# Patient Record
Sex: Female | Born: 1950 | Race: White | Hispanic: No | Smoking: Former smoker
Health system: Southern US, Community
[De-identification: ages and names within clinical notes are randomized; demographics above are authoritative.]

## PROBLEM LIST (undated history)

## (undated) DIAGNOSIS — E0500 Thyrotoxicosis with diffuse goiter without thyrotoxic crisis or storm: Secondary | ICD-10-CM

## (undated) DIAGNOSIS — R011 Cardiac murmur, unspecified: Secondary | ICD-10-CM

## (undated) DIAGNOSIS — T7806XA Anaphylactic reaction due to food additives, initial encounter: Secondary | ICD-10-CM

## (undated) DIAGNOSIS — F419 Anxiety disorder, unspecified: Secondary | ICD-10-CM

## (undated) DIAGNOSIS — R000 Tachycardia, unspecified: Secondary | ICD-10-CM

## (undated) DIAGNOSIS — F319 Bipolar disorder, unspecified: Secondary | ICD-10-CM

## (undated) DIAGNOSIS — S92909A Unspecified fracture of unspecified foot, initial encounter for closed fracture: Secondary | ICD-10-CM

## (undated) DIAGNOSIS — K2100 Gastro-esophageal reflux disease with esophagitis, without bleeding: Secondary | ICD-10-CM

## (undated) DIAGNOSIS — L258 Unspecified contact dermatitis due to other agents: Secondary | ICD-10-CM

## (undated) DIAGNOSIS — E785 Hyperlipidemia, unspecified: Secondary | ICD-10-CM

## (undated) DIAGNOSIS — E0789 Other specified disorders of thyroid: Secondary | ICD-10-CM

## (undated) DIAGNOSIS — S8010XA Contusion of unspecified lower leg, initial encounter: Secondary | ICD-10-CM

## (undated) DIAGNOSIS — I10 Essential (primary) hypertension: Secondary | ICD-10-CM

## (undated) HISTORY — DX: Hyperlipidemia, unspecified: E78.5

## (undated) HISTORY — DX: Essential (primary) hypertension: I10

## (undated) HISTORY — DX: Unspecified contact dermatitis due to other agents: L25.8

## (undated) HISTORY — DX: Other specified disorders of thyroid: E07.89

## (undated) HISTORY — DX: Cardiac murmur, unspecified: R01.1

## (undated) HISTORY — DX: Thyrotoxicosis with diffuse goiter without thyrotoxic crisis or storm: E05.00

## (undated) HISTORY — PX: STERILIZATION: SHX533

## (undated) HISTORY — DX: Gastro-esophageal reflux disease with esophagitis, without bleeding: K21.00

## (undated) HISTORY — PX: CATARACT EXTRACTION: SUR2

## (undated) HISTORY — DX: Gastro-esophageal reflux disease with esophagitis: K21.0

## (undated) HISTORY — DX: Anaphylactic reaction due to food additives, initial encounter: T78.06XA

## (undated) HISTORY — DX: Contusion of unspecified lower leg, initial encounter: S80.10XA

## (undated) HISTORY — DX: Anxiety disorder, unspecified: F41.9

## (undated) HISTORY — PX: SALPINGECTOMY: SHX328

## (undated) HISTORY — DX: Bipolar disorder, unspecified: F31.9

## (undated) HISTORY — DX: Unspecified fracture of unspecified foot, initial encounter for closed fracture: S92.909A

## (undated) HISTORY — PX: OTHER SURGICAL HISTORY: SHX169

## (undated) HISTORY — DX: Tachycardia, unspecified: R00.0

---

## 2004-09-29 ENCOUNTER — Ambulatory Visit: Payer: Self-pay | Admitting: Unknown Physician Specialty

## 2005-01-29 ENCOUNTER — Ambulatory Visit: Payer: Self-pay | Admitting: Internal Medicine

## 2005-03-09 ENCOUNTER — Emergency Department: Payer: Self-pay | Admitting: General Practice

## 2005-03-09 ENCOUNTER — Emergency Department: Payer: Self-pay | Admitting: Emergency Medicine

## 2005-03-14 ENCOUNTER — Emergency Department: Payer: Self-pay | Admitting: Emergency Medicine

## 2005-05-01 ENCOUNTER — Emergency Department: Payer: Self-pay | Admitting: Emergency Medicine

## 2005-05-01 ENCOUNTER — Other Ambulatory Visit: Payer: Self-pay

## 2005-05-24 ENCOUNTER — Emergency Department: Payer: Self-pay | Admitting: Emergency Medicine

## 2005-06-06 ENCOUNTER — Ambulatory Visit: Payer: Self-pay | Admitting: Unknown Physician Specialty

## 2005-11-20 ENCOUNTER — Ambulatory Visit: Payer: Self-pay | Admitting: Internal Medicine

## 2006-11-19 ENCOUNTER — Ambulatory Visit: Payer: Self-pay | Admitting: Gastroenterology

## 2009-11-20 ENCOUNTER — Emergency Department: Payer: Self-pay | Admitting: Emergency Medicine

## 2009-12-29 LAB — HM PAP SMEAR: HM Pap smear: NORMAL

## 2011-09-06 ENCOUNTER — Ambulatory Visit: Payer: Self-pay | Admitting: Family

## 2011-12-25 DIAGNOSIS — F319 Bipolar disorder, unspecified: Secondary | ICD-10-CM | POA: Diagnosis not present

## 2011-12-25 DIAGNOSIS — E0789 Other specified disorders of thyroid: Secondary | ICD-10-CM | POA: Diagnosis not present

## 2011-12-25 DIAGNOSIS — T7806XA Anaphylactic reaction due to food additives, initial encounter: Secondary | ICD-10-CM | POA: Diagnosis not present

## 2011-12-25 DIAGNOSIS — E785 Hyperlipidemia, unspecified: Secondary | ICD-10-CM | POA: Diagnosis not present

## 2012-01-02 DIAGNOSIS — Z124 Encounter for screening for malignant neoplasm of cervix: Secondary | ICD-10-CM | POA: Diagnosis not present

## 2012-01-02 DIAGNOSIS — Z9189 Other specified personal risk factors, not elsewhere classified: Secondary | ICD-10-CM | POA: Diagnosis not present

## 2012-01-02 DIAGNOSIS — N952 Postmenopausal atrophic vaginitis: Secondary | ICD-10-CM | POA: Diagnosis not present

## 2012-01-02 DIAGNOSIS — Z01419 Encounter for gynecological examination (general) (routine) without abnormal findings: Secondary | ICD-10-CM | POA: Diagnosis not present

## 2012-01-22 DIAGNOSIS — Z79899 Other long term (current) drug therapy: Secondary | ICD-10-CM | POA: Diagnosis not present

## 2012-01-30 DIAGNOSIS — F3110 Bipolar disorder, current episode manic without psychotic features, unspecified: Secondary | ICD-10-CM | POA: Diagnosis not present

## 2012-01-31 DIAGNOSIS — J309 Allergic rhinitis, unspecified: Secondary | ICD-10-CM | POA: Diagnosis not present

## 2012-01-31 DIAGNOSIS — J305 Allergic rhinitis due to food: Secondary | ICD-10-CM | POA: Diagnosis not present

## 2012-02-14 DIAGNOSIS — T783XXA Angioneurotic edema, initial encounter: Secondary | ICD-10-CM | POA: Diagnosis not present

## 2012-03-25 DIAGNOSIS — I10 Essential (primary) hypertension: Secondary | ICD-10-CM | POA: Diagnosis not present

## 2012-03-25 DIAGNOSIS — T7806XA Anaphylactic reaction due to food additives, initial encounter: Secondary | ICD-10-CM | POA: Diagnosis not present

## 2012-03-25 DIAGNOSIS — E559 Vitamin D deficiency, unspecified: Secondary | ICD-10-CM | POA: Diagnosis not present

## 2012-03-25 DIAGNOSIS — E785 Hyperlipidemia, unspecified: Secondary | ICD-10-CM | POA: Diagnosis not present

## 2012-05-06 DIAGNOSIS — F3110 Bipolar disorder, current episode manic without psychotic features, unspecified: Secondary | ICD-10-CM | POA: Diagnosis not present

## 2012-07-28 DIAGNOSIS — Z23 Encounter for immunization: Secondary | ICD-10-CM | POA: Diagnosis not present

## 2012-08-15 DIAGNOSIS — F3110 Bipolar disorder, current episode manic without psychotic features, unspecified: Secondary | ICD-10-CM | POA: Diagnosis not present

## 2012-08-22 DIAGNOSIS — E785 Hyperlipidemia, unspecified: Secondary | ICD-10-CM | POA: Diagnosis not present

## 2012-08-22 DIAGNOSIS — E0590 Thyrotoxicosis, unspecified without thyrotoxic crisis or storm: Secondary | ICD-10-CM | POA: Diagnosis not present

## 2012-08-22 DIAGNOSIS — IMO0002 Reserved for concepts with insufficient information to code with codable children: Secondary | ICD-10-CM | POA: Diagnosis not present

## 2012-09-04 DIAGNOSIS — N6489 Other specified disorders of breast: Secondary | ICD-10-CM | POA: Diagnosis not present

## 2012-09-04 DIAGNOSIS — Z1231 Encounter for screening mammogram for malignant neoplasm of breast: Secondary | ICD-10-CM | POA: Diagnosis not present

## 2012-09-04 DIAGNOSIS — J438 Other emphysema: Secondary | ICD-10-CM | POA: Diagnosis not present

## 2012-09-04 DIAGNOSIS — Z79899 Other long term (current) drug therapy: Secondary | ICD-10-CM | POA: Diagnosis not present

## 2012-09-05 DIAGNOSIS — E039 Hypothyroidism, unspecified: Secondary | ICD-10-CM | POA: Diagnosis not present

## 2012-09-05 DIAGNOSIS — E785 Hyperlipidemia, unspecified: Secondary | ICD-10-CM | POA: Diagnosis not present

## 2012-09-05 DIAGNOSIS — R928 Other abnormal and inconclusive findings on diagnostic imaging of breast: Secondary | ICD-10-CM | POA: Diagnosis not present

## 2012-09-19 DIAGNOSIS — E039 Hypothyroidism, unspecified: Secondary | ICD-10-CM | POA: Diagnosis not present

## 2012-09-19 DIAGNOSIS — E785 Hyperlipidemia, unspecified: Secondary | ICD-10-CM | POA: Diagnosis not present

## 2012-09-19 DIAGNOSIS — F319 Bipolar disorder, unspecified: Secondary | ICD-10-CM | POA: Diagnosis not present

## 2012-09-19 DIAGNOSIS — H35379 Puckering of macula, unspecified eye: Secondary | ICD-10-CM | POA: Diagnosis not present

## 2012-11-19 ENCOUNTER — Ambulatory Visit: Payer: Self-pay | Admitting: Family Medicine

## 2012-11-19 DIAGNOSIS — S0180XA Unspecified open wound of other part of head, initial encounter: Secondary | ICD-10-CM | POA: Diagnosis not present

## 2012-11-21 DIAGNOSIS — F3110 Bipolar disorder, current episode manic without psychotic features, unspecified: Secondary | ICD-10-CM | POA: Diagnosis not present

## 2012-12-29 LAB — HM MAMMOGRAPHY: HM MAMMO: NORMAL

## 2013-01-16 DIAGNOSIS — N181 Chronic kidney disease, stage 1: Secondary | ICD-10-CM | POA: Diagnosis not present

## 2013-01-16 DIAGNOSIS — E785 Hyperlipidemia, unspecified: Secondary | ICD-10-CM | POA: Diagnosis not present

## 2013-01-16 DIAGNOSIS — I129 Hypertensive chronic kidney disease with stage 1 through stage 4 chronic kidney disease, or unspecified chronic kidney disease: Secondary | ICD-10-CM | POA: Diagnosis not present

## 2013-02-18 DIAGNOSIS — F3110 Bipolar disorder, current episode manic without psychotic features, unspecified: Secondary | ICD-10-CM | POA: Diagnosis not present

## 2013-04-10 DIAGNOSIS — E785 Hyperlipidemia, unspecified: Secondary | ICD-10-CM | POA: Diagnosis not present

## 2013-04-10 DIAGNOSIS — Z79899 Other long term (current) drug therapy: Secondary | ICD-10-CM | POA: Diagnosis not present

## 2013-04-17 DIAGNOSIS — E785 Hyperlipidemia, unspecified: Secondary | ICD-10-CM | POA: Diagnosis not present

## 2013-05-13 DIAGNOSIS — F3110 Bipolar disorder, current episode manic without psychotic features, unspecified: Secondary | ICD-10-CM | POA: Diagnosis not present

## 2013-07-13 DIAGNOSIS — Z23 Encounter for immunization: Secondary | ICD-10-CM | POA: Diagnosis not present

## 2013-07-31 DIAGNOSIS — I129 Hypertensive chronic kidney disease with stage 1 through stage 4 chronic kidney disease, or unspecified chronic kidney disease: Secondary | ICD-10-CM | POA: Diagnosis not present

## 2013-07-31 DIAGNOSIS — Z5189 Encounter for other specified aftercare: Secondary | ICD-10-CM | POA: Diagnosis not present

## 2013-07-31 DIAGNOSIS — E785 Hyperlipidemia, unspecified: Secondary | ICD-10-CM | POA: Diagnosis not present

## 2013-07-31 DIAGNOSIS — N181 Chronic kidney disease, stage 1: Secondary | ICD-10-CM | POA: Diagnosis not present

## 2013-08-19 DIAGNOSIS — F3110 Bipolar disorder, current episode manic without psychotic features, unspecified: Secondary | ICD-10-CM | POA: Diagnosis not present

## 2013-08-21 DIAGNOSIS — F319 Bipolar disorder, unspecified: Secondary | ICD-10-CM | POA: Diagnosis not present

## 2013-08-21 DIAGNOSIS — I129 Hypertensive chronic kidney disease with stage 1 through stage 4 chronic kidney disease, or unspecified chronic kidney disease: Secondary | ICD-10-CM | POA: Diagnosis not present

## 2013-08-21 DIAGNOSIS — Z87891 Personal history of nicotine dependence: Secondary | ICD-10-CM | POA: Diagnosis not present

## 2013-08-21 DIAGNOSIS — N181 Chronic kidney disease, stage 1: Secondary | ICD-10-CM | POA: Diagnosis not present

## 2013-09-21 DIAGNOSIS — T783XXA Angioneurotic edema, initial encounter: Secondary | ICD-10-CM | POA: Diagnosis not present

## 2013-09-21 DIAGNOSIS — L272 Dermatitis due to ingested food: Secondary | ICD-10-CM | POA: Diagnosis not present

## 2013-10-09 DIAGNOSIS — H35379 Puckering of macula, unspecified eye: Secondary | ICD-10-CM | POA: Diagnosis not present

## 2013-10-09 DIAGNOSIS — H2510 Age-related nuclear cataract, unspecified eye: Secondary | ICD-10-CM | POA: Diagnosis not present

## 2013-10-09 DIAGNOSIS — H25049 Posterior subcapsular polar age-related cataract, unspecified eye: Secondary | ICD-10-CM | POA: Diagnosis not present

## 2013-10-09 DIAGNOSIS — H02839 Dermatochalasis of unspecified eye, unspecified eyelid: Secondary | ICD-10-CM | POA: Diagnosis not present

## 2013-10-09 DIAGNOSIS — H25019 Cortical age-related cataract, unspecified eye: Secondary | ICD-10-CM | POA: Diagnosis not present

## 2013-10-21 DIAGNOSIS — I129 Hypertensive chronic kidney disease with stage 1 through stage 4 chronic kidney disease, or unspecified chronic kidney disease: Secondary | ICD-10-CM | POA: Diagnosis not present

## 2013-10-21 DIAGNOSIS — N181 Chronic kidney disease, stage 1: Secondary | ICD-10-CM | POA: Diagnosis not present

## 2013-10-21 DIAGNOSIS — E785 Hyperlipidemia, unspecified: Secondary | ICD-10-CM | POA: Diagnosis not present

## 2013-10-21 DIAGNOSIS — F319 Bipolar disorder, unspecified: Secondary | ICD-10-CM | POA: Diagnosis not present

## 2013-10-23 DIAGNOSIS — L509 Urticaria, unspecified: Secondary | ICD-10-CM | POA: Diagnosis not present

## 2013-10-23 DIAGNOSIS — L272 Dermatitis due to ingested food: Secondary | ICD-10-CM | POA: Diagnosis not present

## 2013-10-23 DIAGNOSIS — T783XXA Angioneurotic edema, initial encounter: Secondary | ICD-10-CM | POA: Diagnosis not present

## 2013-11-04 ENCOUNTER — Ambulatory Visit: Payer: Self-pay | Admitting: Family

## 2013-11-04 DIAGNOSIS — Z87891 Personal history of nicotine dependence: Secondary | ICD-10-CM | POA: Diagnosis not present

## 2013-11-04 DIAGNOSIS — I129 Hypertensive chronic kidney disease with stage 1 through stage 4 chronic kidney disease, or unspecified chronic kidney disease: Secondary | ICD-10-CM | POA: Diagnosis not present

## 2013-11-04 DIAGNOSIS — M79609 Pain in unspecified limb: Secondary | ICD-10-CM | POA: Diagnosis not present

## 2013-11-04 DIAGNOSIS — N181 Chronic kidney disease, stage 1: Secondary | ICD-10-CM | POA: Diagnosis not present

## 2013-11-04 DIAGNOSIS — M7989 Other specified soft tissue disorders: Secondary | ICD-10-CM | POA: Diagnosis not present

## 2013-11-04 DIAGNOSIS — R609 Edema, unspecified: Secondary | ICD-10-CM | POA: Diagnosis not present

## 2013-11-04 DIAGNOSIS — S8990XA Unspecified injury of unspecified lower leg, initial encounter: Secondary | ICD-10-CM | POA: Diagnosis not present

## 2013-11-20 DIAGNOSIS — F3110 Bipolar disorder, current episode manic without psychotic features, unspecified: Secondary | ICD-10-CM | POA: Diagnosis not present

## 2013-12-21 DIAGNOSIS — H2510 Age-related nuclear cataract, unspecified eye: Secondary | ICD-10-CM | POA: Diagnosis not present

## 2013-12-21 DIAGNOSIS — H269 Unspecified cataract: Secondary | ICD-10-CM | POA: Diagnosis not present

## 2014-01-17 ENCOUNTER — Emergency Department: Payer: Self-pay | Admitting: Internal Medicine

## 2014-01-17 DIAGNOSIS — R000 Tachycardia, unspecified: Secondary | ICD-10-CM | POA: Diagnosis not present

## 2014-01-17 DIAGNOSIS — Z888 Allergy status to other drugs, medicaments and biological substances status: Secondary | ICD-10-CM | POA: Diagnosis not present

## 2014-01-17 DIAGNOSIS — R079 Chest pain, unspecified: Secondary | ICD-10-CM | POA: Diagnosis not present

## 2014-01-17 DIAGNOSIS — I10 Essential (primary) hypertension: Secondary | ICD-10-CM | POA: Diagnosis not present

## 2014-01-17 DIAGNOSIS — Z9851 Tubal ligation status: Secondary | ICD-10-CM | POA: Diagnosis not present

## 2014-01-17 DIAGNOSIS — R002 Palpitations: Secondary | ICD-10-CM | POA: Diagnosis not present

## 2014-01-17 DIAGNOSIS — Z79899 Other long term (current) drug therapy: Secondary | ICD-10-CM | POA: Diagnosis not present

## 2014-01-17 DIAGNOSIS — R072 Precordial pain: Secondary | ICD-10-CM | POA: Diagnosis not present

## 2014-01-17 LAB — URINALYSIS, COMPLETE
BACTERIA: NONE SEEN
Glucose,UR: NEGATIVE mg/dL (ref 0–75)
KETONE: NEGATIVE
Nitrite: NEGATIVE
PROTEIN: NEGATIVE
Ph: 6 (ref 4.5–8.0)
RBC,UR: 1 /HPF (ref 0–5)
Specific Gravity: 1.011 (ref 1.003–1.030)

## 2014-01-17 LAB — COMPREHENSIVE METABOLIC PANEL
ALT: 44 U/L (ref 12–78)
ANION GAP: 5 — AB (ref 7–16)
Albumin: 4.3 g/dL (ref 3.4–5.0)
Alkaline Phosphatase: 85 U/L
BUN: 22 mg/dL — ABNORMAL HIGH (ref 7–18)
Bilirubin,Total: 0.3 mg/dL (ref 0.2–1.0)
CALCIUM: 9.4 mg/dL (ref 8.5–10.1)
CHLORIDE: 104 mmol/L (ref 98–107)
Co2: 29 mmol/L (ref 21–32)
Creatinine: 0.74 mg/dL (ref 0.60–1.30)
EGFR (African American): >60
GLUCOSE: 84 mg/dL (ref 65–99)
OSMOLALITY: 278 (ref 275–301)
Potassium: 4 mmol/L (ref 3.5–5.1)
SGOT(AST): 34 U/L (ref 15–37)
Sodium: 138 mmol/L (ref 136–145)
Total Protein: 7.8 g/dL (ref 6.4–8.2)

## 2014-01-17 LAB — CBC
HCT: 39.9 % (ref 35.0–47.0)
HGB: 13.1 g/dL (ref 12.0–16.0)
MCH: 28.3 pg (ref 26.0–34.0)
MCHC: 32.8 g/dL (ref 32.0–36.0)
MCV: 86 fL (ref 80–100)
Platelet: 141 10*3/uL — ABNORMAL LOW (ref 150–440)
RBC: 4.64 10*6/uL (ref 3.80–5.20)
RDW: 14.1 % (ref 11.5–14.5)
WBC: 4.8 10*3/uL (ref 3.6–11.0)

## 2014-01-17 LAB — TROPONIN I: Troponin-I: <0.02 ng/mL

## 2014-01-17 LAB — TSH: THYROID STIMULATING HORM: 2.02 u[IU]/mL

## 2014-01-22 ENCOUNTER — Ambulatory Visit (INDEPENDENT_AMBULATORY_CARE_PROVIDER_SITE_OTHER): Payer: Medicare Other | Admitting: Cardiovascular Disease

## 2014-01-22 ENCOUNTER — Encounter: Payer: Self-pay | Admitting: Cardiovascular Disease

## 2014-01-22 ENCOUNTER — Encounter (INDEPENDENT_AMBULATORY_CARE_PROVIDER_SITE_OTHER): Payer: Self-pay

## 2014-01-22 VITALS — BP 140/88 | HR 68 | Ht 67.5 in | Wt 138.5 lb

## 2014-01-22 DIAGNOSIS — E785 Hyperlipidemia, unspecified: Secondary | ICD-10-CM

## 2014-01-22 DIAGNOSIS — R0789 Other chest pain: Secondary | ICD-10-CM | POA: Diagnosis not present

## 2014-01-22 DIAGNOSIS — R000 Tachycardia, unspecified: Secondary | ICD-10-CM | POA: Insufficient documentation

## 2014-01-22 DIAGNOSIS — Z87891 Personal history of nicotine dependence: Secondary | ICD-10-CM | POA: Insufficient documentation

## 2014-01-22 MED ORDER — PROPRANOLOL HCL 20 MG PO TABS: 20.0000 mg | ORAL_TABLET | Freq: Three times a day (TID) | ORAL | Status: DC | PRN

## 2014-01-22 NOTE — Assessment & Plan Note (Signed)
Etiology of her tachycardia includes possible atrial tachycardia, sinus tachycardia, SVT or other arrhythmia. As her arrhythmia is rare, only one episode, and she's been asymptomatic otherwise, she will monitor her heart rhythm closely using her pulse meter on her phone. We have offered Holter monitor or 30 day monitor. She does not feel that this would capture her rhythm at this time. She will call us if she has additional episodes. We have given her propranolol 20 mg to take as needed

## 2014-01-22 NOTE — Progress Notes (Signed)
Patient ID: Amy Mcconnell, female    DOB: 04/18/1951, 63 y.o.   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 She presents for new patient evaluation.  She reports that last weekend, approximately 5 days ago, she woke at 6 AM and shortly after developed acute onset tachycardia. She feels it was a regular rhythm, is uncertain of the speed reports that it was significantly faster than her baseline rhythm. She lay still on her bed for 2 hours and eventually her symptoms resolved. She did not want to go to the emergency room for that symptoms did not resolve, she eventually went to the emergency room. In the ER, her symptoms have resolved en route. EKG was essentially normal, basic metabolic panel normal, cardiac enzymes normal, no acute findings and she was sent home.   She denies having symptoms prior to this or since then. She does report that she drinks some alcohol, has dark chocolate every night before bed. She tries to watch her diet closely and exercises. She is very active, works at hospice and able to exert herself without any symptoms. She denies any lightheadedness, near syncope or syncope.  EKG shows normal sinus rhythm with rate 68 beats per minute with nonspecific ST abnormality  Prior lab work in 2012 shows total cholesterol 200, normal renal function Prior notes from Dr. Delorise Jackson  indicates symptoms of leg swelling in December 2014. She was sent for leg ultrasound. Results unavailable   Outpatient Encounter Prescriptions as of 01/22/2014  Medication Sig  . aspirin 81 MG tablet Take 81 mg by mouth daily.  Marland Kitchen atorvastatin (LIPITOR) 40 MG tablet Take 40 mg by mouth daily.  . B Complex Vitamins (VITAMIN B COMPLEX PO) Take by mouth daily.  . diphenhydrAMINE (SOMINEX) 25 MG tablet  Take 25 mg by mouth at bedtime as needed for sleep.  Marland Kitchen levothyroxine (SYNTHROID, LEVOTHROID) 125 MCG tablet Take 125 mcg by mouth daily before breakfast.  . lithium carbonate (ESKALITH) 450 MG CR tablet Take by mouth 2 (two) times daily.  . Omega-3 Fatty Acids (FISH OIL) 1000 MG CAPS Take by mouth daily.  . propranolol (INDERAL) 20 MG tablet Take 1 tablet (20 mg total) by mouth 3 (three) times daily as needed (for tachycardia).  . vitamin B-12 (CYANOCOBALAMIN) 1000 MCG tablet Take 1,000 mcg by mouth daily.  . vitamin E 1000 UNIT capsule Take 1,000 Units by mouth daily.  Marland Kitchen zinc gluconate 50 MG tablet Take 50 mg by mouth daily.  . ziprasidone (GEODON) 60 MG capsule Take 60 mg by mouth daily.    Review of Systems  Constitutional: Negative.   HENT: Negative.   Eyes: Negative.   Respiratory: Negative.   Cardiovascular: Positive for palpitations.       Tachycardia  Gastrointestinal: Negative.   Endocrine: Negative.   Musculoskeletal: Negative.   Skin: Negative.   Allergic/Immunologic: Negative.   Neurological: Negative.   Hematological: Negative.   Psychiatric/Behavioral: Negative.   All other systems reviewed and are negative.    BP 140/88  Pulse 68  Ht 5' 7.5" (1.715 m)  Wt 138 lb 8 oz (62.823 kg)  BMI 21.36 kg/m2  Physical Exam  Nursing note and vitals reviewed. Constitutional: She is oriented to person, place, and time. She appears well-developed and well-nourished.  HENT:  Head: Normocephalic.  Nose: Nose normal.  Mouth/Throat: Oropharynx is clear and moist.  Eyes: Conjunctivae are normal. Pupils are equal, round, and reactive to light.  Neck: Normal range of motion. Neck supple. No JVD present.  Cardiovascular: Normal rate, regular rhythm, S1 normal, S2 normal, normal heart sounds and intact distal pulses.  Exam reveals no gallop and no friction rub.   No murmur heard. Pulmonary/Chest: Effort normal and breath sounds normal. No respiratory distress. She has no wheezes.  She has no rales. She exhibits no tenderness.  Abdominal: Soft. Bowel sounds are normal. She exhibits no distension. There is no tenderness.  Musculoskeletal: Normal range of motion. She exhibits no edema and no tenderness.  Lymphadenopathy:    She has no cervical adenopathy.  Neurological: She is alert and oriented to person, place, and time. Coordination normal.  Skin: Skin is warm and dry. No rash noted. No erythema.  Psychiatric: She has a normal mood and affect. Her behavior is normal. Judgment and thought content normal.    Assessment and Plan        and

## 2014-01-22 NOTE — Patient Instructions (Signed)
You are doing well. Take propranolol as needed for tachycardia.  Consider Red Yeast Rice for cholesterol  App store on your phone search for "pulse meter" Download pulse monitoring programs When you have palpitations, measure your heart rate   Please call us if you have new issues that need to be addressed before your next appt.

## 2014-01-22 NOTE — Assessment & Plan Note (Signed)
She stopped many years ago. No significant shortness of breath with exertion concerning for COPD

## 2014-01-22 NOTE — Assessment & Plan Note (Signed)
She is taking fish oil for cholesterol. We have recommended she changes to red yeast rice as her triglycerides are less than 70

## 2014-02-10 DIAGNOSIS — Z79899 Other long term (current) drug therapy: Secondary | ICD-10-CM | POA: Diagnosis not present

## 2014-02-12 ENCOUNTER — Encounter: Payer: Medicare Other | Admitting: Adult Health

## 2014-02-15 DIAGNOSIS — F3110 Bipolar disorder, current episode manic without psychotic features, unspecified: Secondary | ICD-10-CM | POA: Diagnosis not present

## 2014-02-15 NOTE — Progress Notes (Signed)
This encounter was created in error - please disregard.

## 2014-02-18 ENCOUNTER — Telehealth: Payer: Self-pay | Admitting: Adult Health

## 2014-02-18 NOTE — Telephone Encounter (Signed)
Pt states she received a call asking her to call the office.  No messages seen to relay to pt.  Pt reminded of upcoming appt.

## 2014-02-26 ENCOUNTER — Encounter: Payer: Self-pay | Admitting: Adult Health

## 2014-02-26 ENCOUNTER — Ambulatory Visit (INDEPENDENT_AMBULATORY_CARE_PROVIDER_SITE_OTHER): Payer: Medicare Other | Admitting: Adult Health

## 2014-02-26 ENCOUNTER — Encounter (INDEPENDENT_AMBULATORY_CARE_PROVIDER_SITE_OTHER): Payer: Self-pay

## 2014-02-26 VITALS — BP 138/84 | HR 76 | Temp 98.1°F | Resp 14 | Ht 67.5 in | Wt 137.0 lb

## 2014-02-26 DIAGNOSIS — Z91018 Allergy to other foods: Secondary | ICD-10-CM

## 2014-02-26 DIAGNOSIS — T781XXA Other adverse food reactions, not elsewhere classified, initial encounter: Secondary | ICD-10-CM | POA: Diagnosis not present

## 2014-02-26 NOTE — Progress Notes (Signed)
Pre visit review using our clinic review tool, if applicable. No additional management support is needed unless otherwise documented below in the visit note. 

## 2014-02-26 NOTE — Progress Notes (Signed)
Patient ID: Amy Mcconnell, female   DOB: [birth year], 63 y.o.   MRN: [id number]    Subjective:    Patient ID: Amy Mcconnell, female    DOB: 06/10/1951, 63 y.o.   MRN: [id number]  HPI Pt is a pleasant 63 y/o female who presents to clinic to establish care. She is due for her complete physical and she will schedule that relatively soon. She has brought her medical records with her. Pt with multiple food allergies which she controls well. She had extensive testing which did not show "true" allergy to anything but thought they may be sensitivities. She reports having lip swelling with certain fruits and vegetables. Uncertain if it may be r/t chemicals that are added as part of the fertilizing process. No concerns today.   Past Medical History  Diagnosis Date  . Other specified disorders of thyroid   . Essential hypertension, benign   . Contusion of unspecified part of lower limb   . Other and unspecified hyperlipidemia   . Contact dermatitis and other eczema due to other specified agent   . Reflux esophagitis   . Bipolar disorder     Dr. Thurmond Butts - every 3 months  . 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 Heart murmur      Past Surgical History  Procedure Laterality Date  . Cataract extraction    . Salpingectomy Left   . Sterilization      Her decision since dz of bipolar     Family History  Problem Relation Age of Onset  . Arrhythmia Mother     A-Fib  . Heart failure Mother   . Hyperlipidemia Mother   . Hypertension Mother   . Hypertension Father   . Hyperlipidemia Father   . Mental retardation Father 57    Suicide  . Hypertension Brother   . Hyperlipidemia Brother   . Hypertension Brother   . Hyperlipidemia Brother   . Hyperlipidemia Brother   . Hypertension Brother   . Alcohol abuse Brother   . Depression Brother   . Hyperlipidemia Brother   . Hypertension Brother      History   Social History  . Marital Status: Divorced    Spouse Name: N/A    Number  of Children: 0  . Years of Education: 36   Occupational History  . Not on file.   Social History Main Topics  . Smoking status: Former Smoker -- 0.50 packs/day for 13 years    Quit date: 01/23/2000  . Smokeless tobacco: Not on file  . Alcohol Use: No  . Drug Use: No  . Sexual Activity: Not on file   Other Topics Concern  . Not on file   Social History Narrative   Ms. Guthridge grew up in [place], [place]. She lives in [place]. Ms. Kitzmiller is divorced. She has 2 cats (Charlie and Angie). 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 She also volunteers 2 days at [facility]. She enjoys reading, garding and cooking.     Current Outpatient Prescriptions on File Prior to Visit  Medication Sig Dispense Refill  . aspirin 81 MG tablet Take 81 mg by mouth daily.      Marland Kitchen atorvastatin (LIPITOR) 40 MG tablet Take 40 mg by mouth daily.      . B Complex Vitamins (VITAMIN B COMPLEX PO) Take by mouth daily.      . diphenhydrAMINE (SOMINEX) 25 MG tablet Take 25 mg by mouth at bedtime as needed for sleep.      Marland Kitchen  levothyroxine (SYNTHROID, LEVOTHROID) 125 MCG tablet Take 125 mcg by mouth daily before breakfast.      . lithium carbonate (ESKALITH) 450 MG CR tablet Take by mouth every evening.       . Omega-3 Fatty Acids (FISH OIL) 1000 MG CAPS Take by mouth daily.      . propranolol (INDERAL) 20 MG tablet Take 1 tablet (20 mg total) by mouth 3 (three) times daily as needed (for tachycardia).  90 tablet  3  . vitamin B-12 (CYANOCOBALAMIN) 1000 MCG tablet Take 1,000 mcg by mouth daily.      . vitamin E 1000 UNIT capsule Take 1,000 Units by mouth daily.      Marland Kitchen zinc gluconate 50 MG tablet Take 50 mg by mouth daily.      . ziprasidone (GEODON) 60 MG capsule Take 60 mg by mouth daily.       No current facility-administered medications on file prior to visit.     Review of Systems  Constitutional: Negative.   HENT: Negative.   Eyes: Negative.   Respiratory: Negative.   Cardiovascular: Negative.     Gastrointestinal: Negative.   Endocrine: Negative.   Genitourinary: Negative.   Musculoskeletal: Negative.   Skin: Negative.   Allergic/Immunologic: Positive for environmental allergies and food allergies. Negative for immunocompromised state.  Neurological: Negative.   Hematological: Negative.   Psychiatric/Behavioral: Negative.        Objective:  BP 138/84  Pulse 76  Temp(Src) 98.1 F (36.7 C) (Oral)  Resp 14  Ht 5' 7.5" (1.715 m)  Wt 137 lb (62.143 kg)  BMI 21.13 kg/m2  SpO2 95%   Physical Exam  Constitutional: She is oriented to person, place, and time. No distress.  HENT:  Head: Normocephalic and atraumatic.  Eyes: Conjunctivae and EOM are normal.  Neck: Normal range of motion. Neck supple.  Cardiovascular: Normal rate, regular rhythm, normal heart sounds and intact distal pulses.  Exam reveals no gallop and no friction rub.   No murmur heard. Pulmonary/Chest: Effort normal and breath sounds normal. No respiratory distress. She has no wheezes. She has no rales.  Musculoskeletal: Normal range of motion.  Neurological: She is alert and oriented to person, place, and time. She has normal reflexes. Coordination normal.  Skin: Skin is warm and dry.  Psychiatric: She has a normal mood and affect. Her behavior is normal. Judgment and thought content normal.      Assessment & Plan:   1. Multiple food allergies Sensitivities to certain foods. She takes benadryl to help control her symptoms which typically involve swelling of the lips and eyes. She has not experienced any shortness of breath. She also take Allegra daily which also helps.

## 2014-02-26 NOTE — Patient Instructions (Signed)
   Thank you for choosing [place] [facility] for your health care needs.  Schedule your complete physical. You will need to be fasting for that appointment.  Remember to activate your MyChart Account.

## 2014-03-26 ENCOUNTER — Encounter: Payer: Self-pay | Admitting: Adult Health

## 2014-03-26 ENCOUNTER — Ambulatory Visit (INDEPENDENT_AMBULATORY_CARE_PROVIDER_SITE_OTHER): Payer: Medicare Other | Admitting: Adult Health

## 2014-03-26 VITALS — BP 126/74 | HR 53 | Temp 97.7°F | Resp 16 | Ht 67.5 in | Wt 134.8 lb

## 2014-03-26 DIAGNOSIS — R5383 Other fatigue: Secondary | ICD-10-CM | POA: Diagnosis not present

## 2014-03-26 DIAGNOSIS — Z1239 Encounter for other screening for malignant neoplasm of breast: Secondary | ICD-10-CM

## 2014-03-26 DIAGNOSIS — Z13820 Encounter for screening for osteoporosis: Secondary | ICD-10-CM | POA: Diagnosis not present

## 2014-03-26 DIAGNOSIS — Z0000 Encounter for general adult medical examination without abnormal findings: Secondary | ICD-10-CM

## 2014-03-26 DIAGNOSIS — R5381 Other malaise: Secondary | ICD-10-CM

## 2014-03-26 DIAGNOSIS — E785 Hyperlipidemia, unspecified: Secondary | ICD-10-CM | POA: Diagnosis not present

## 2014-03-26 LAB — CBC WITH DIFFERENTIAL/PLATELET
Basophils Absolute: 0 10*3/uL (ref 0.0–0.1)
Basophils Relative: 0.5 % (ref 0.0–3.0)
EOS PCT: 1.9 % (ref 0.0–5.0)
Eosinophils Absolute: 0.1 10*3/uL (ref 0.0–0.7)
HCT: 39.5 % (ref 36.0–46.0)
Hemoglobin: 13.2 g/dL (ref 12.0–15.0)
LYMPHS ABS: 1.5 10*3/uL (ref 0.7–4.0)
Lymphocytes Relative: 34 % (ref 12.0–46.0)
MCHC: 33.4 g/dL (ref 30.0–36.0)
MCV: 85.2 fl (ref 78.0–100.0)
MONOS PCT: 9.4 % (ref 3.0–12.0)
Monocytes Absolute: 0.4 10*3/uL (ref 0.1–1.0)
Neutro Abs: 2.3 10*3/uL (ref 1.4–7.7)
Neutrophils Relative %: 54.2 % (ref 43.0–77.0)
Platelets: 168 10*3/uL (ref 150.0–400.0)
RBC: 4.64 Mil/uL (ref 3.87–5.11)
RDW: 14.6 % (ref 11.5–15.5)
WBC: 4.3 10*3/uL (ref 4.0–10.5)

## 2014-03-26 LAB — LIPID PANEL
CHOL/HDL RATIO: 2
CHOLESTEROL: 215 mg/dL — AB (ref 0–200)
HDL: 106.3 mg/dL (ref >39.00)
LDL CALC: 101 mg/dL — AB (ref 0–99)
TRIGLYCERIDES: 41 mg/dL (ref 0.0–149.0)
VLDL: 8.2 mg/dL (ref 0.0–40.0)

## 2014-03-26 LAB — COMPREHENSIVE METABOLIC PANEL
ALT: 30 U/L (ref 0–35)
AST: 34 U/L (ref 0–37)
Albumin: 4.4 g/dL (ref 3.5–5.2)
Alkaline Phosphatase: 66 U/L (ref 39–117)
BILIRUBIN TOTAL: 0.6 mg/dL (ref 0.2–1.2)
BUN: 26 mg/dL — AB (ref 6–23)
CO2: 27 meq/L (ref 19–32)
CREATININE: 0.8 mg/dL (ref 0.4–1.2)
Calcium: 9.7 mg/dL (ref 8.4–10.5)
Chloride: 107 mEq/L (ref 96–112)
GFR: 74.74 mL/min (ref >60.00)
GLUCOSE: 81 mg/dL (ref 70–99)
Potassium: 4.4 mEq/L (ref 3.5–5.1)
SODIUM: 140 meq/L (ref 135–145)
Total Protein: 7.2 g/dL (ref 6.0–8.3)

## 2014-03-26 LAB — TSH: TSH: 1.5 u[IU]/mL (ref 0.35–4.50)

## 2014-03-26 NOTE — Progress Notes (Signed)
Pre visit review using our clinic review tool, if applicable. No additional management support is needed unless otherwise documented below in the visit note. 

## 2014-03-26 NOTE — Patient Instructions (Signed)
  You had your annual physical including lab work.  We will contact you with the results and any instructions once they are available.  I will fax a copy of your labs to Dr. Thurmond Butts  I have ordered a mammogram and dexa scan. We will call you with an appointment.  Please call with any questions or concerns.

## 2014-03-26 NOTE — Progress Notes (Signed)
Patient ID: Amy Mcconnell, female   DOB: September 03, 1951, 63 y.o.   MRN: [id number]   Subjective:    Patient ID: Amy Mcconnell, female    DOB: 1951/01/24, 63 y.o.   MRN: [id number]  HPI  Past Medical History  Diagnosis Date  . Other specified disorders of thyroid   . Essential hypertension, benign   . Contusion of unspecified part of lower limb   . Other and unspecified hyperlipidemia   . Contact dermatitis and other eczema due to other specified agent   . Reflux esophagitis   . Bipolar disorder     Dr. Thurmond Butts - every 3 months  . 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 Heart murmur     Current Outpatient Prescriptions on File Prior to Visit  Medication Sig Dispense Refill  . Ascorbic Acid (VITAMIN C) 1000 MG tablet Take 1,000 mg by mouth daily.      Marland Kitchen aspirin 81 MG tablet Take 81 mg by mouth daily.      Marland Kitchen atorvastatin (LIPITOR) 40 MG tablet Take 40 mg by mouth daily.      . B Complex Vitamins (VITAMIN B COMPLEX PO) Take by mouth daily.      . calcium carbonate (OS-CAL) 600 MG TABS tablet Take 600 mg by mouth 2 (two) times daily with a meal.      . diphenhydrAMINE (SOMINEX) 25 MG tablet Take 25 mg by mouth at bedtime as needed for sleep.      . fexofenadine (ALLEGRA) 180 MG tablet Take 180 mg by mouth daily.      Marland Kitchen levothyroxine (SYNTHROID, LEVOTHROID) 125 MCG tablet Take 125 mcg by mouth daily before breakfast.      . lithium carbonate (ESKALITH) 450 MG CR tablet Take by mouth every evening.       . magnesium oxide (MAG-OX) 400 MG tablet Take 400 mg by mouth daily.      . Multiple Vitamins-Minerals (MULTIVITAMIN WITH MINERALS) tablet Take 1 tablet by mouth daily.      . Multiple Vitamins-Minerals (VISION FORMULA/LUTEIN) TABS Take by mouth.      . Omega-3 Fatty Acids (FISH OIL) 1000 MG CAPS Take by mouth daily.      . Oxcarbazepine (TRILEPTAL) 300 MG tablet Take 300 mg by mouth 2 (two) times daily.      . propranolol (INDERAL) 20 MG tablet Take 1 tablet (20 mg total) by mouth 3  (three) times daily as needed (for tachycardia).  90 tablet  3  . Red Yeast Rice Extract (RED YEAST RICE PO) Take 1,200 mg by mouth at bedtime.      . vitamin B-12 (CYANOCOBALAMIN) 1000 MCG tablet Take 1,000 mcg by mouth daily.      . vitamin E 1000 UNIT capsule Take 1,000 Units by mouth daily.      Marland Kitchen zinc gluconate 50 MG tablet Take 50 mg by mouth daily.      . ziprasidone (GEODON) 60 MG capsule Take 60 mg by mouth daily.       No current facility-administered medications on file prior to visit.     Review of Systems  Constitutional: Positive for fatigue (slight).  HENT: Negative.   Eyes: Negative.   Respiratory: Negative.   Cardiovascular: Negative.   Gastrointestinal: Negative.   Endocrine: Negative.   Genitourinary: Negative.   Musculoskeletal: Negative.   Skin: Negative.   Allergic/Immunologic: Negative.   Neurological: Negative.   Hematological: Negative.   Psychiatric/Behavioral: Negative.  Objective:  BP 126/74  Pulse 53  Temp(Src) 97.7 F (36.5 C) (Oral)  Resp 16  Ht 5' 7.5" (1.715 m)  Wt 134 lb 12 oz (61.122 kg)  BMI 20.78 kg/m2  SpO2 97%   Physical Exam  Constitutional: She is oriented to person, place, and time. She appears well-developed and well-nourished. No distress.  HENT:  Head: Normocephalic and atraumatic.  Right Ear: External ear normal.  Left Ear: External ear normal.  Nose: Nose normal.  Mouth/Throat: Oropharynx is clear and moist.  Eyes: Conjunctivae and EOM are normal. Pupils are equal, round, and reactive to light.  Neck: Normal range of motion. Neck supple. No tracheal deviation present. No thyromegaly present.  Cardiovascular: Normal rate, regular rhythm, normal heart sounds and intact distal pulses.  Exam reveals no gallop and no friction rub.   No murmur heard. Pulmonary/Chest: Effort normal and breath sounds normal. No respiratory distress. She has no wheezes. She has no rales.  Abdominal: Soft. Bowel sounds are normal. She  exhibits no distension and no mass. There is no tenderness. There is no rebound and no guarding.  Genitourinary:  Deferred. Followed by GYN  Musculoskeletal: Normal range of motion. She exhibits no edema and no tenderness.  Lymphadenopathy:    She has no cervical adenopathy.  Neurological: She is alert and oriented to person, place, and time. She has normal reflexes. No cranial nerve deficit. Coordination normal.  Skin: Skin is warm and dry.  Psychiatric: She has a normal mood and affect. Her behavior is normal. Judgment and thought content normal.       Assessment & Plan:   1. Routine general medical examination at a health care facility Normal physical exam. GYN deferred since followed by Dr. Fredonia Highland. All screenings addressed.  2. Screening for breast cancer Mammogram ordered. Schedule at [place]; Future  3. Screening for osteoporosis Schedule at [facility]; Future  4. Fatigue Check labs - TSH - Comprehensive metabolic panel - CBC with Differential  5. HLD (hyperlipidemia) Check cholesterol level. Pt on medication. Continue to follow - Lipid panel

## 2014-04-12 ENCOUNTER — Other Ambulatory Visit: Payer: Self-pay

## 2014-04-12 MED ORDER — ATORVASTATIN CALCIUM 40 MG PO TABS: 40.0000 mg | ORAL_TABLET | Freq: Every day | ORAL | Status: DC

## 2014-05-11 ENCOUNTER — Telehealth: Payer: Self-pay | Admitting: Adult Health

## 2014-05-11 ENCOUNTER — Encounter: Payer: Self-pay | Admitting: Adult Health

## 2014-05-11 ENCOUNTER — Ambulatory Visit (INDEPENDENT_AMBULATORY_CARE_PROVIDER_SITE_OTHER): Payer: Medicare Other | Admitting: Adult Health

## 2014-05-11 VITALS — BP 126/78 | HR 65 | Temp 98.2°F | Resp 14 | Wt 138.8 lb

## 2014-05-11 DIAGNOSIS — S0083XA Contusion of other part of head, initial encounter: Secondary | ICD-10-CM

## 2014-05-11 DIAGNOSIS — S1093XA Contusion of unspecified part of neck, initial encounter: Secondary | ICD-10-CM

## 2014-05-11 DIAGNOSIS — S0003XA Contusion of scalp, initial encounter: Secondary | ICD-10-CM | POA: Diagnosis not present

## 2014-05-11 DIAGNOSIS — S00532A Contusion of oral cavity, initial encounter: Secondary | ICD-10-CM

## 2014-05-11 NOTE — Progress Notes (Signed)
Subjective:    Patient ID: Amy Mcconnell, female    DOB: 1950/12/26, 63 y.o.   MRN: [id number]  HPI Pleasant 63 yo female presents today after being hit in the mouth by a tree. Was planting flowers and digging a hole when the tree branch stabbed the ride side of her mouth. Initially she took a benedryl. Swelling occurred immediately. Denies any damage to teeth or inside of mouth. Eating okay. Denies any airway issues. Has been sucking on popsicles to help.   Denies any other treatment or concern  Past Medical History  Diagnosis Date  . Other specified disorders of thyroid   . Essential hypertension, benign   . Contusion of unspecified part of lower limb   . Other and unspecified hyperlipidemia   . Contact dermatitis and other eczema due to other specified agent   . Reflux esophagitis   . Bipolar disorder     Dr. Thurmond Butts - every 3 months  . 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 Heart murmur     Current Outpatient Prescriptions on File Prior to Visit  Medication Sig Dispense Refill  . Ascorbic Acid (VITAMIN C) 1000 MG tablet Take 1,000 mg by mouth daily.      Marland Kitchen aspirin 81 MG tablet Take 81 mg by mouth daily.      Marland Kitchen atorvastatin (LIPITOR) 40 MG tablet Take 1 tablet (40 mg total) by mouth daily.  30 tablet  5  . B Complex Vitamins (VITAMIN B COMPLEX PO) Take by mouth daily.      . calcium carbonate (OS-CAL) 600 MG TABS tablet Take 600 mg by mouth 2 (two) times daily with a meal.      . diphenhydrAMINE (SOMINEX) 25 MG tablet Take 25 mg by mouth at bedtime as needed for sleep.      . fexofenadine (ALLEGRA) 180 MG tablet Take 180 mg by mouth daily.      Marland Kitchen levothyroxine (SYNTHROID, LEVOTHROID) 125 MCG tablet Take 125 mcg by mouth daily before breakfast.      . lithium carbonate (ESKALITH) 450 MG CR tablet Take by mouth every evening.       . magnesium oxide (MAG-OX) 400 MG tablet Take 400 mg by mouth daily.      . Multiple Vitamins-Minerals (MULTIVITAMIN WITH MINERALS) tablet  Take 1 tablet by mouth daily.      . Multiple Vitamins-Minerals (VISION FORMULA/LUTEIN) TABS Take by mouth.      . Omega-3 Fatty Acids (FISH OIL) 1000 MG CAPS Take by mouth daily.      . Oxcarbazepine (TRILEPTAL) 300 MG tablet Take 300 mg by mouth 2 (two) times daily.      . propranolol (INDERAL) 20 MG tablet Take 1 tablet (20 mg total) by mouth 3 (three) times daily as needed (for tachycardia).  90 tablet  3  . Red Yeast Rice Extract (RED YEAST RICE PO) Take 1,200 mg by mouth at bedtime.      . vitamin B-12 (CYANOCOBALAMIN) 1000 MCG tablet Take 1,000 mcg by mouth daily.      . vitamin E 1000 UNIT capsule Take 1,000 Units by mouth daily.      Marland Kitchen zinc gluconate 50 MG tablet Take 50 mg by mouth daily.      . ziprasidone (GEODON) 60 MG capsule Take 60 mg by mouth daily.       No current facility-administered medications on file prior to visit.     Review of Systems  Constitutional: Negative.  HENT:       Ecchymosis around right side of mouth  Eyes: Negative.   Respiratory: Negative.   Cardiovascular: Negative.   All other systems reviewed and are negative.      Objective:  BP 126/78  Pulse 65  Temp(Src) 98.2 F (36.8 C) (Oral)  Resp 14  Wt 138 lb 12 oz (62.937 kg)  SpO2 99%   Physical Exam  Constitutional: She is oriented to person, place, and time. She appears well-developed and well-nourished. No distress.  HENT:  Head: Normocephalic.  No obvious deformity or signs of infection. Ecchymosis of right mouth. Redness of inside of mouth noted on the right side. Mucus membranes are slightly reddened on the right but otherwise unremarkable.  Teeth and gums are intact. Tongue is without inflammation. Uvula is midline.    Cardiovascular: Normal rate, regular rhythm and normal heart sounds.   Pulmonary/Chest: Effort normal and breath sounds normal.  Neurological: She is alert and oriented to person, place, and time.  Skin: Skin is warm and dry.  Psychiatric: She has a normal mood and  affect. Her behavior is normal. Judgment and thought content normal.       Assessment & Plan:   1. Contusion of mouth, initial encounter [place] branch hit right side of mouth causing ecchymosis. Inflammation has decreased. May apply ice to the area for pain and edema control for 15 minutes 3-4 times daily. Follow up if symptoms worsen or do not improve.

## 2014-05-11 NOTE — Patient Instructions (Signed)
May apply ice to the area for 15 minutes 3-4 times per day for pain and inflammation.

## 2014-05-11 NOTE — Telephone Encounter (Signed)
error 

## 2014-05-11 NOTE — Progress Notes (Signed)
Pre visit review using our clinic review tool, if applicable. No additional management support is needed unless otherwise documented below in the visit note. 

## 2014-05-12 ENCOUNTER — Telehealth: Payer: Self-pay

## 2014-05-12 ENCOUNTER — Telehealth: Payer: Self-pay | Admitting: Adult Health

## 2014-05-12 NOTE — Telephone Encounter (Signed)
Spoke with patient and she stated that she is doing well. Using frozen peas on lip and the swelling is going down. She will continue to ice lip as needed. She thanks [place] for all of our help and caring.

## 2014-05-12 NOTE — Telephone Encounter (Signed)
Please call pt to see how she is doing. I saw her yesterday with a large bruise on her lip after injury with a yucca plant. Continue ice. Have her call with any concerns.

## 2014-05-12 NOTE — Telephone Encounter (Signed)
Call both numbers from patient's chart, no answer. Left detailed message on cell phone voicemail with Raquel's comments. Left callback number for patient.

## 2014-05-14 DIAGNOSIS — F3110 Bipolar disorder, current episode manic without psychotic features, unspecified: Secondary | ICD-10-CM | POA: Diagnosis not present

## 2014-06-29 ENCOUNTER — Encounter: Payer: Self-pay | Admitting: Adult Health

## 2014-06-29 ENCOUNTER — Ambulatory Visit: Payer: Self-pay | Admitting: Adult Health

## 2014-06-29 ENCOUNTER — Telehealth: Payer: Self-pay | Admitting: *Deleted

## 2014-06-29 ENCOUNTER — Ambulatory Visit (INDEPENDENT_AMBULATORY_CARE_PROVIDER_SITE_OTHER): Payer: Medicare Other | Admitting: Adult Health

## 2014-06-29 VITALS — BP 128/80 | HR 68 | Temp 98.3°F | Resp 14 | Ht 67.5 in | Wt 136.8 lb

## 2014-06-29 DIAGNOSIS — S8010XA Contusion of unspecified lower leg, initial encounter: Secondary | ICD-10-CM | POA: Diagnosis not present

## 2014-06-29 DIAGNOSIS — Z1389 Encounter for screening for other disorder: Secondary | ICD-10-CM | POA: Diagnosis not present

## 2014-06-29 DIAGNOSIS — I808 Phlebitis and thrombophlebitis of other sites: Secondary | ICD-10-CM

## 2014-06-29 NOTE — Telephone Encounter (Signed)
Prelim results negative for blood clot in arm. Knot near elbow looks like hematoma 1.5 x 1.6. Will send final results when complete. Sent patient home, advised office would call with further instructions/results
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
## 2014-06-29 NOTE — Progress Notes (Signed)
Pre visit review using our clinic review tool, if applicable. No additional management support is needed unless otherwise documented below in the visit note. 

## 2014-06-30 ENCOUNTER — Telehealth: Payer: Self-pay | Admitting: Adult Health

## 2014-06-30 NOTE — Telephone Encounter (Signed)
Pt called in and was wanting to see if the results of the ultrasound was done. Stated she needed to call the insurance company and let them know.

## 2014-07-01 NOTE — Telephone Encounter (Signed)
Spoke with Amy Mcconnell and discuss applying ice to the area. She has some bruising and would suspect that once this is resolved the area will also improve. She made an appt with her ortho for next Thursday. She will keep the appt just in case but may cancel if better by then.

## 2014-07-01 NOTE — Telephone Encounter (Signed)
Please review Raquel's note

## 2014-07-01 NOTE — Telephone Encounter (Signed)
Pt called again states she knows she does not have a blood clot, however she is wanting to be referred to a specialist that amy be able to evaluate whether she has a ligament or muscle tear.  Please advise

## 2014-07-01 NOTE — Telephone Encounter (Signed)
Pt left VM requesting final U/S results.  Please advise

## 2014-07-01 NOTE — Telephone Encounter (Signed)
Left detailed message on VM of Raquel's message.

## 2014-07-01 NOTE — Telephone Encounter (Signed)
No blood clot. She can put some ice on the area but it should subside on its own.

## 2014-07-19 ENCOUNTER — Encounter: Payer: Self-pay | Admitting: Adult Health

## 2014-07-19 DIAGNOSIS — Z23 Encounter for immunization: Secondary | ICD-10-CM | POA: Diagnosis not present

## 2014-07-20 ENCOUNTER — Encounter: Payer: Self-pay | Admitting: Adult Health

## 2014-07-23 ENCOUNTER — Ambulatory Visit (INDEPENDENT_AMBULATORY_CARE_PROVIDER_SITE_OTHER): Payer: Medicare Other | Admitting: Cardiovascular Disease

## 2014-07-23 ENCOUNTER — Encounter: Payer: Self-pay | Admitting: Cardiovascular Disease

## 2014-07-23 ENCOUNTER — Encounter (INDEPENDENT_AMBULATORY_CARE_PROVIDER_SITE_OTHER): Payer: Self-pay

## 2014-07-23 VITALS — BP 140/100 | HR 62 | Ht 67.5 in | Wt 138.2 lb

## 2014-07-23 DIAGNOSIS — R079 Chest pain, unspecified: Secondary | ICD-10-CM | POA: Diagnosis not present

## 2014-07-23 DIAGNOSIS — R000 Tachycardia, unspecified: Secondary | ICD-10-CM

## 2014-07-23 DIAGNOSIS — R0789 Other chest pain: Secondary | ICD-10-CM

## 2014-07-23 DIAGNOSIS — E785 Hyperlipidemia, unspecified: Secondary | ICD-10-CM | POA: Diagnosis not present

## 2014-07-23 NOTE — Assessment & Plan Note (Signed)
Episode of chest pain on the left many months ago. Presenting at rest, very atypical in nature. She has been very active with no recurrent symptoms. Likely very atypical in nature. She feels it was from drinking too much soda as this has happened before. No further workup at this time

## 2014-07-23 NOTE — Assessment & Plan Note (Signed)
Encouraged her to stay on her Lipitor. She does not need to continue on red yeast rice
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
## 2014-07-23 NOTE — Assessment & Plan Note (Signed)
No further episodes of tachycardia. No further workup at this time

## 2014-07-23 NOTE — Patient Instructions (Signed)
You are doing well. No medication changes were made. Ok to hold [employer]  Please call us if you have new issues that need to be addressed before your next appt.  Your physician wants you to follow-up in: 12 months.  You will receive a reminder letter in the mail two months in advance. If you don't receive a letter, please call our office to schedule the follow-up appointment.

## 2014-08-13 DIAGNOSIS — F3170 Bipolar disorder, currently in remission, most recent episode unspecified: Secondary | ICD-10-CM | POA: Diagnosis not present

## 2014-08-20 ENCOUNTER — Ambulatory Visit (INDEPENDENT_AMBULATORY_CARE_PROVIDER_SITE_OTHER)
Admission: RE | Admit: 2014-08-20 | Discharge: 2014-08-20 | Disposition: A | Discharge status: Home / Self Care | Payer: Medicare Other | Source: Ambulatory Visit | Attending: Internal Medicine | Admitting: Internal Medicine

## 2014-08-20 ENCOUNTER — Encounter: Payer: Self-pay | Admitting: Internal Medicine

## 2014-08-20 ENCOUNTER — Ambulatory Visit (INDEPENDENT_AMBULATORY_CARE_PROVIDER_SITE_OTHER): Payer: Medicare Other | Admitting: Internal Medicine

## 2014-08-20 VITALS — BP 150/98 | HR 76 | Temp 98.4°F | Resp 14 | Wt 138.5 lb

## 2014-08-20 DIAGNOSIS — M79672 Pain in left foot: Secondary | ICD-10-CM | POA: Diagnosis not present

## 2014-08-20 DIAGNOSIS — S99922A Unspecified injury of left foot, initial encounter: Secondary | ICD-10-CM | POA: Diagnosis not present

## 2014-08-20 DIAGNOSIS — S92002A Unspecified fracture of left calcaneus, initial encounter for closed fracture: Secondary | ICD-10-CM

## 2014-08-20 DIAGNOSIS — S92009A Unspecified fracture of unspecified calcaneus, initial encounter for closed fracture: Secondary | ICD-10-CM | POA: Insufficient documentation

## 2014-08-20 DIAGNOSIS — S92032A Displaced avulsion fracture of tuberosity of left calcaneus, initial encounter for closed fracture: Secondary | ICD-10-CM | POA: Diagnosis not present

## 2014-08-20 NOTE — Assessment & Plan Note (Signed)
Twisting injury Clear contusion and some tenderness

## 2014-08-20 NOTE — Patient Instructions (Signed)
Please use aleve 1 tab twice a day for now Intermittently ice your foot for 5-10 minutes every few hours. Please set up an appointment with Dr Lorelei Pont for next week

## 2014-08-20 NOTE — Assessment & Plan Note (Signed)
Occurred yesterday and unfortunately has been working on it all day Will put in walking boot and asked her to be non weight bearing for now--she has crutches aleve Will set up next week with Dr Lorelei Pont
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
## 2014-08-20 NOTE — Progress Notes (Signed)
Pre visit review using our clinic review tool, if applicable. No additional management support is needed unless otherwise documented below in the visit note. 

## 2014-08-23 ENCOUNTER — Ambulatory Visit (INDEPENDENT_AMBULATORY_CARE_PROVIDER_SITE_OTHER): Payer: Medicare Other | Admitting: Family Medicine

## 2014-08-23 ENCOUNTER — Ambulatory Visit (INDEPENDENT_AMBULATORY_CARE_PROVIDER_SITE_OTHER)
Admission: RE | Admit: 2014-08-23 | Discharge: 2014-08-23 | Disposition: A | Discharge status: Home / Self Care | Payer: Medicare Other | Source: Ambulatory Visit | Attending: Family Medicine | Admitting: Family Medicine

## 2014-08-23 ENCOUNTER — Encounter: Payer: Self-pay | Admitting: Family Medicine

## 2014-08-23 VITALS — BP 190/109 | HR 73 | Temp 98.0°F | Ht 67.5 in | Wt 143.8 lb

## 2014-08-23 DIAGNOSIS — S92032A Displaced avulsion fracture of tuberosity of left calcaneus, initial encounter for closed fracture: Secondary | ICD-10-CM | POA: Diagnosis not present

## 2014-08-23 DIAGNOSIS — S92155A Nondisplaced avulsion fracture (chip fracture) of left talus, initial encounter for closed fracture: Secondary | ICD-10-CM | POA: Diagnosis not present

## 2014-08-23 DIAGNOSIS — S99922D Unspecified injury of left foot, subsequent encounter: Secondary | ICD-10-CM

## 2014-08-23 DIAGNOSIS — S92152A Displaced avulsion fracture (chip fracture) of left talus, initial encounter for closed fracture: Secondary | ICD-10-CM | POA: Diagnosis not present

## 2014-08-23 DIAGNOSIS — S92002D Unspecified fracture of left calcaneus, subsequent encounter for fracture with routine healing: Secondary | ICD-10-CM | POA: Diagnosis not present

## 2014-08-23 DIAGNOSIS — S99912A Unspecified injury of left ankle, initial encounter: Secondary | ICD-10-CM | POA: Diagnosis not present

## 2014-08-23 NOTE — Progress Notes (Signed)
Dr. Frederico Hamman T. Rai Severns, MD, [facility] [place], [address] Phone: [phone] Fax: [phone]  08/23/2014  Patient: Amy Mcconnell, MRN: [id number], DOB: 1951/06/06, 63 y.o.  Primary Physician:  Charolette Forward, NP  Chief Complaint: Foot Injury  Subjective:   Amy Mcconnell is a 63 y.o. very pleasant female patient who presents with the following:  DOI: 08/19/2014 Consulting MD: Dr. Silvio Pate  Planting bushes and moving a birdbath, went from the dining room step. L foot turned under her, missed a step and she came down onto that foot. Some of the details are not entirely clear due to pain surrounding the trauma.    08/20/2014, She saw Dr. Silvio Pate who placed her in a short pneumatic CAM walker and made her Non-weightbearing. She has been using crutches since that time. She is somewhat unsteady on crutches and has concerns about falling.   She still has 2 dogwoods to plant.   Past Medical History, Surgical History, Social History, Family History, Problem List, Medications, and Allergies have been reviewed and updated if relevant.  GEN: No fevers, chills. Nontoxic. Primarily MSK c/o today. MSK: Detailed in the HPI GI: tolerating PO intake without difficulty Neuro: No numbness, parasthesias, or tingling associated. Otherwise the pertinent positives of the ROS are noted above.   Objective:   BP 190/109  Pulse 73  Temp(Src) 98 F (36.7 C) (Oral)  Ht 5' 7.5" (1.715 m)  Wt 143 lb 12 oz (65.205 kg)  BMI 22.17 kg/m2   GEN: WDWN, NAD, Non-toxic, Alert & Oriented x 3 HEENT: Atraumatic, Normocephalic.  Ears and Nose: No external deformity. PSYCH: Normally interactive. Conversant. Not depressed or anxious appearing.  Calm demeanor.   FEET: L Echymosis: more medially and small amount laterally Edema: moderate over midfoot and most of ankle ROM: limited Gait:limited MT pain: no Callus pattern: none Lateral Mall:  NT Medial Mall: NT Talus: TTP DORSALLY Navicular: NT Cuboid: NT Calcaneous: LATERAL TENDERNESS Metatarsals: NT 5th MT: NT Phalanges: NT Achilles: NT Plantar Fascia: NT Fat Pad: NT Peroneals: NT Post Tib: NT Great Toe: Nml motion Ant Drawer: neg ATFL: NT CFL: NT Deltoid: TENDER TO PALPATION Sensation: intact   Radiology: Dg Ankle Complete Left  08/23/2014   CLINICAL DATA:  Foot injury, left, subsequent encounter  EXAM: LEFT ANKLE COMPLETE - 3+ VIEW  COMPARISON:  08/20/2014  FINDINGS: Mild diffuse soft tissue swelling. Normal ankle alignment. Left distal tibia and fibula intact. On the lateral view, there is a small avulsion type fracture of the dorsal talus surface.  On the frontal view, the distal calcaneus avulsion fracture laterally is faintly visualized. This is better demonstrated on the comparison foot exam from 08/20/2014.  IMPRESSION: Dorsal talus small nondisplaced avulsion type or chip fracture  Lateral calcaneus avulsion fracture at the extensor digitorum brevis insertion.  Diffuse soft tissue swelling.   Electronically Signed   By: Daryll Brod M.D.   On: 08/23/2014 09:51   Dg Foot 2 Views Left  08/20/2014   CLINICAL DATA:  Twisting injury to the left foot yesterday with marked swelling and some pain/ tenderness. Initial encounter.  EXAM: LEFT FOOT - 2 VIEW  COMPARISON:  None.  FINDINGS: There is a thin, curvilinear osseous fragment measuring approximately 1.1 cm in length just lateral to the distal aspect of the calcaneus on the dorsoplantar radiograph, likely reflecting an avulsion fracture of the calcaneus at the origin of the extensor digitorum brevis. There is no dislocation. Soft  tissue swelling is noted at the lateral and dorsal aspects of the midfoot. Small plantar calcaneal enthesophyte is noted. No lytic or blastic osseous lesion is seen.  IMPRESSION: Avulsion fracture of the distal calcaneus at the origin the extensor digitorum brevis.   Electronically Signed   By:  Logan Bores   On: 08/20/2014 13:40    Assessment and Plan:   Avulsion fracture of calcaneus, left, with routine healing, subsequent encounter  Foot injury, left, subsequent encounter - Plan: DG Ankle Complete Left  Avulsion fracture of left talus, closed, initial encounter  She has avulsion fracture of the calcaneous that was known, and there is also an avulsion fracture of talus seen dorsally seen on today's ankle film.   Moderate deltoid ligament sprain, too.   I am going to allow her to ambulate in her CAM walker, which she is doing fairly well in now. Crutches I think pose bigger trauma risk in this patient.   Will follow, anticipate approximate 6 weeks of immobilization.  Follow-up: Return in about 3 weeks (around 09/13/2014).  New Prescriptions   No medications on file   Orders Placed This Encounter  Procedures  . DG Ankle Complete Left    Signed,  Gaither Biehn T. Kevionna Heffler, MD   Patient's Medications  New Prescriptions   No medications on file  Previous Medications   ASCORBIC ACID (VITAMIN C) 1000 MG TABLET    Take 1,000 mg by mouth daily.   ASPIRIN 81 MG TABLET    Take 81 mg by mouth daily.   ATORVASTATIN (LIPITOR) 40 MG TABLET    Take 1 tablet (40 mg total) by mouth daily.   B COMPLEX VITAMINS (VITAMIN B COMPLEX PO)    Take by mouth daily.   CALCIUM CARBONATE (OS-CAL) 600 MG TABS TABLET    Take 600 mg by mouth 2 (two) times daily with a meal.   DIPHENHYDRAMINE (SOMINEX) 25 MG TABLET    Take 25 mg by mouth at bedtime as needed for sleep.   FEXOFENADINE (ALLEGRA) 180 MG TABLET    Take 180 mg by mouth daily.   LEVOTHYROXINE (SYNTHROID, LEVOTHROID) 125 MCG TABLET    Take 125 mcg by mouth daily before breakfast.   LITHIUM CARBONATE (ESKALITH) 450 MG CR TABLET    Take by mouth every evening.    MAGNESIUM OXIDE (MAG-OX) 400 MG TABLET    Take 400 mg by mouth daily.   MULTIPLE VITAMINS-MINERALS (MULTIVITAMIN WITH MINERALS) TABLET    Take 1 tablet by mouth daily.   MULTIPLE  VITAMINS-MINERALS (VISION FORMULA/LUTEIN) TABS    Take by mouth.   NAPROXEN SODIUM (ALEVE) 220 MG TABLET    Take 440 mg by mouth every 8 (eight) hours.   OMEGA-3 FATTY ACIDS (FISH OIL) 1000 MG CAPS    Take by mouth daily.   OXCARBAZEPINE (TRILEPTAL) 300 MG TABLET    Take 300 mg by mouth 2 (two) times daily.   PROPRANOLOL (INDERAL) 20 MG TABLET    Take 1 tablet (20 mg total) by mouth 3 (three) times daily as needed (for tachycardia).   VITAMIN B-12 (CYANOCOBALAMIN) 1000 MCG TABLET    Take 1,000 mcg by mouth daily.   VITAMIN E 1000 UNIT CAPSULE    Take 1,000 Units by mouth daily.   ZINC GLUCONATE 50 MG TABLET    Take 50 mg by mouth daily.   ZIPRASIDONE (GEODON) 60 MG CAPSULE    Take 60 mg by mouth daily.  Modified Medications   No medications on file  Discontinued Medications   No medications on file

## 2014-08-23 NOTE — Progress Notes (Signed)
Pre visit review using our clinic review tool, if applicable. No additional management support is needed unless otherwise documented below in the visit note. 

## 2014-09-07 ENCOUNTER — Telehealth: Payer: Self-pay | Admitting: Internal Medicine

## 2014-09-07 MED ORDER — ATORVASTATIN CALCIUM 40 MG PO TABS: 40.0000 mg | ORAL_TABLET | Freq: Every day | ORAL | Status: DC

## 2014-09-07 NOTE — Telephone Encounter (Signed)
Refill sent for Lipitor as requested .

## 2014-09-13 ENCOUNTER — Encounter: Payer: Self-pay | Admitting: Family Medicine

## 2014-09-13 ENCOUNTER — Ambulatory Visit (INDEPENDENT_AMBULATORY_CARE_PROVIDER_SITE_OTHER): Payer: Medicare Other | Admitting: Family Medicine

## 2014-09-13 VITALS — BP 179/107 | HR 72 | Temp 98.1°F | Ht 67.5 in | Wt 143.2 lb

## 2014-09-13 DIAGNOSIS — S92002D Unspecified fracture of left calcaneus, subsequent encounter for fracture with routine healing: Secondary | ICD-10-CM

## 2014-09-13 DIAGNOSIS — S92152D Displaced avulsion fracture (chip fracture) of left talus, subsequent encounter for fracture with routine healing: Secondary | ICD-10-CM | POA: Diagnosis not present

## 2014-09-13 NOTE — Progress Notes (Signed)
Pre visit review using our clinic review tool, if applicable. No additional management support is needed unless otherwise documented below in the visit note. 

## 2014-09-13 NOTE — Progress Notes (Signed)
Dr. Frederico Hamman T. Sharona Rovner, MD, [facility] [place], [address] Phone: [phone] Fax: [phone]  09/13/2014  Patient: Amy Mcconnell, MRN: [id number], DOB: 1951-09-21, 63 y.o.  Primary Physician:  Charolette Forward, NP  Chief Complaint: Follow-up  Subjective:   Amy Mcconnell is a 63 y.o. very pleasant female patient who presents with the following:  The patient is doing quite a bit better compared to the last time that I saw her.  Her pain level is much better, her swelling is down, and she is ambulating without any kind of difficulty in her walking boot.  She still has tenderness in the foot and ankle, but it is much better.  08/23/2014 Last OV with Owens Loffler, MD  DOI: 08/19/2014 Consulting MD: Dr. Silvio Pate  Planting bushes and moving a birdbath, went from the dining room step. L foot turned under her, missed a step and she came down onto that foot. Some of the details are not entirely clear due to pain surrounding the trauma.    08/20/2014, She saw Dr. Silvio Pate who placed her in a short pneumatic CAM walker and made her Non-weightbearing. She has been using crutches since that time. She is somewhat unsteady on crutches and has concerns about falling.   She still has 2 dogwoods to plant.   Past Medical History, Surgical History, Social History, Family History, Problem List, Medications, and Allergies have been reviewed and updated if relevant.  GEN: No fevers, chills. Nontoxic. Primarily MSK c/o today. MSK: Detailed in the HPI GI: tolerating PO intake without difficulty Neuro: No numbness, parasthesias, or tingling associated. Otherwise the pertinent positives of the ROS are noted above.   Objective:   BP 179/107 mmHg  Pulse 72  Temp(Src) 98.1 F (36.7 C) (Oral)  Ht 5' 7.5" (1.715 m)  Wt 143 lb 4 oz (64.978 kg)  BMI 22.09 kg/m2   GEN: WDWN, NAD, Non-toxic, Alert & Oriented x 3 HEENT: Atraumatic,  Normocephalic.  Ears and Nose: No external deformity. PSYCH: Normally interactive. Conversant. Not depressed or anxious appearing.  Calm demeanor.   FEET: L Echymosis: much improved Edema: mild ROM: much improved, but not full Gait:limited MT pain: no Callus pattern: none Lateral Mall: NT Medial Mall: NT Talus: TTP DORSALLY, much improved Navicular: NT Cuboid: NT Calcaneous: LATERAL TENDERNESS, much improved Metatarsals: NT 5th MT: NT Phalanges: NT Achilles: NT Plantar Fascia: NT Fat Pad: NT Peroneals: NT Post Tib: NT Great Toe: Nml motion Ant Drawer: neg ATFL: NT CFL: NT Deltoid: TENDER TO PALPATION, improved Sensation: intact   Radiology: Dg Ankle Complete Left  08/23/2014   CLINICAL DATA:  Foot injury, left, subsequent encounter  EXAM: LEFT ANKLE COMPLETE - 3+ VIEW  COMPARISON:  08/20/2014  FINDINGS: Mild diffuse soft tissue swelling. Normal ankle alignment. Left distal tibia and fibula intact. On the lateral view, there is a small avulsion type fracture of the dorsal talus surface.  On the frontal view, the distal calcaneus avulsion fracture laterally is faintly visualized. This is better demonstrated on the comparison foot exam from 08/20/2014.  IMPRESSION: Dorsal talus small nondisplaced avulsion type or chip fracture  Lateral calcaneus avulsion fracture at the extensor digitorum brevis insertion.  Diffuse soft tissue swelling.   Electronically Signed   By: Daryll Brod M.D.   On: 08/23/2014 09:51   Dg Foot 2 Views Left  08/20/2014   CLINICAL DATA:  Twisting injury to the left foot yesterday with marked swelling and some  pain/ tenderness. Initial encounter.  EXAM: LEFT FOOT - 2 VIEW  COMPARISON:  None.  FINDINGS: There is a thin, curvilinear osseous fragment measuring approximately 1.1 cm in length just lateral to the distal aspect of the calcaneus on the dorsoplantar radiograph, likely reflecting an avulsion fracture of the calcaneus at the origin of the extensor  digitorum brevis. There is no dislocation. Soft tissue swelling is noted at the lateral and dorsal aspects of the midfoot. Small plantar calcaneal enthesophyte is noted. No lytic or blastic osseous lesion is seen.  IMPRESSION: Avulsion fracture of the distal calcaneus at the origin the extensor digitorum brevis.   Electronically Signed   By: Logan Bores   On: 08/20/2014 13:40    Assessment and Plan:   Avulsion fracture of calcaneus, left, with routine healing, subsequent encounter  Avulsion fracture of left talus, with routine healing, subsequent encounter  She is doing quite a bit better.  Her pain is much better.  Her swelling is improved overall.  She is bearing weight now without difficulty in her walking boot.  She will follow-up in 3-4 weeks for reassessment.  She still does have some tender areas.  Signed,  Maud Deed. Raine Elsass, MD   Patient's Medications  New Prescriptions   No medications on file  Previous Medications   ASCORBIC ACID (VITAMIN C) 1000 MG TABLET    Take 1,000 mg by mouth daily.   ASPIRIN 81 MG TABLET    Take 81 mg by mouth daily.   ATORVASTATIN (LIPITOR) 40 MG TABLET    Take 1 tablet (40 mg total) by mouth daily.   B COMPLEX VITAMINS (VITAMIN B COMPLEX PO)    Take by mouth daily.   CALCIUM CARBONATE (OS-CAL) 600 MG TABS TABLET    Take 600 mg by mouth 2 (two) times daily with a meal.   CHOLECALCIFEROL (VITAMIN D PO)    Take 1 tablet by mouth every morning.    DIPHENHYDRAMINE (BENADRYL) 25 MG CAPSULE    Take 25 mg by mouth daily as needed for allergies.   FEXOFENADINE (ALLEGRA) 180 MG TABLET    Take 180 mg by mouth 2 (two) times daily.    LEVOTHYROXINE (SYNTHROID, LEVOTHROID) 125 MCG TABLET    Take 125 mcg by mouth daily before breakfast.   LITHIUM CARBONATE (ESKALITH) 450 MG CR TABLET    Take by mouth every evening.    MAGNESIUM OXIDE (MAG-OX) 400 MG TABLET    Take 400 mg by mouth daily.   MULTIPLE VITAMINS-MINERALS (MULTIVITAMIN WITH MINERALS) TABLET    Take 1  tablet by mouth daily.   MULTIPLE VITAMINS-MINERALS (VISION FORMULA/LUTEIN) TABS    Take by mouth.   NAPROXEN SODIUM (ALEVE) 220 MG TABLET    Take 440 mg by mouth every 8 (eight) hours.   OMEGA-3 FATTY ACIDS (FISH OIL) 1000 MG CAPS    Take by mouth daily.   OXCARBAZEPINE (TRILEPTAL) 300 MG TABLET    Take 300 mg by mouth 2 (two) times daily.   PROPRANOLOL (INDERAL) 20 MG TABLET    Take 1 tablet (20 mg total) by mouth 3 (three) times daily as needed (for tachycardia).   VITAMIN B-12 (CYANOCOBALAMIN) 1000 MCG TABLET    Take 1,000 mcg by mouth daily.   VITAMIN E 1000 UNIT CAPSULE    Take 1,000 Units by mouth daily.   ZINC GLUCONATE 50 MG TABLET    Take 50 mg by mouth daily.   ZIPRASIDONE (GEODON) 60 MG CAPSULE    Take 60  mg by mouth daily.  Modified Medications   No medications on file  Discontinued Medications   DIPHENHYDRAMINE (SOMINEX) 25 MG TABLET    Take 25 mg by mouth at bedtime as needed for sleep.

## 2014-09-20 ENCOUNTER — Telehealth: Payer: Self-pay

## 2014-09-20 NOTE — Telephone Encounter (Signed)
Left message for Amy Mcconnell to continue wearing her CAM walker boot for her fractures.

## 2014-09-20 NOTE — Telephone Encounter (Signed)
Spoke with [name].  Advised following up with Dr. Karl Bales about her blood pressure is a very good idea.  Also reminder her per Dr. Lorelei Pont to continue wearing CAM walker boot for her fractures.

## 2014-09-20 NOTE — Telephone Encounter (Signed)
Ok, she should continue wearing her CAM walker boot for her fractures.

## 2014-09-20 NOTE — Telephone Encounter (Signed)
Pt left v/m;pt broke her foot on 08/19/14 and pt has seen Dr Lorelei Pont and at the visits pt BP has been elevated;pt wants Dr Lorelei Pont to know that pt has appt with Dr Rockey Situ on 10/06/14 to evaluate elevated BP. Pt thinks reason BP has been elevated is because her x husband is in the surgical CCU at [place]. Pt request cb.

## 2014-09-20 NOTE — Telephone Encounter (Signed)
Pt left v/m returning a call and request cb.

## 2014-10-06 ENCOUNTER — Encounter: Payer: Self-pay | Admitting: Cardiovascular Disease

## 2014-10-06 ENCOUNTER — Ambulatory Visit (INDEPENDENT_AMBULATORY_CARE_PROVIDER_SITE_OTHER): Payer: Medicare Other | Admitting: Cardiovascular Disease

## 2014-10-06 VITALS — BP 145/90 | HR 63 | Ht 67.5 in | Wt 144.2 lb

## 2014-10-06 DIAGNOSIS — I10 Essential (primary) hypertension: Secondary | ICD-10-CM

## 2014-10-06 DIAGNOSIS — S92002D Unspecified fracture of left calcaneus, subsequent encounter for fracture with routine healing: Secondary | ICD-10-CM

## 2014-10-06 DIAGNOSIS — R000 Tachycardia, unspecified: Secondary | ICD-10-CM

## 2014-10-06 NOTE — Patient Instructions (Addendum)
You are doing well. No medication changes were made.  Please monitor your blood pressure at home Please drop them off at your convenience  Please call us if you have new issues that need to be addressed before your next appt.  Your physician wants you to follow-up in: 12 months.  You will receive a reminder letter in the mail two months in advance. If you don't receive a letter, please call our office to schedule the follow-up appointment.

## 2014-10-06 NOTE — Assessment & Plan Note (Signed)
We have recommended that she bring in her blood pressure cuff tomorrow and we will correlate with our manual blood pressure cuff. If this is not accurate, recommended she buy a new blood pressure cuff. Recommended she start recording her blood pressure at home twice a day for the next 2 weeks. In the past, blood pressure was well controlled. Possible hypertension in the setting of recent stressors and foot fracture. Improved on recheck today but still mildly elevated. She's willing to take blood pressure pills. We will gather more data first.

## 2014-10-06 NOTE — Assessment & Plan Note (Signed)
She reports having continued mild pain. Tolerating the splint

## 2014-10-06 NOTE — Assessment & Plan Note (Signed)
No further episodes of tachycardia. No further workup at this time
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
## 2014-10-08 ENCOUNTER — Ambulatory Visit: Payer: Medicare Other | Admitting: *Deleted

## 2014-10-08 ENCOUNTER — Other Ambulatory Visit: Payer: Self-pay

## 2014-10-08 VITALS — BP 198/110

## 2014-10-08 DIAGNOSIS — I10 Essential (primary) hypertension: Secondary | ICD-10-CM

## 2014-10-08 MED ORDER — AMLODIPINE BESYLATE 5 MG PO TABS: 5.0000 mg | ORAL_TABLET | Freq: Every day | ORAL | Status: DC

## 2014-10-08 NOTE — Patient Instructions (Addendum)
Please start amlodipine 5 mg once daily  Please continue to monitor your blood pressure and call the office with readings  Please follow up with Christell Faith, PA in 1 week

## 2014-10-08 NOTE — Progress Notes (Signed)
Manual blood pressure: 198/110 Automatic blood pressure machine: 193/112 Patient blood pressure machine: 199/111  Patient complains of headache  Denies any other symptoms

## 2014-10-11 ENCOUNTER — Encounter: Payer: Self-pay | Admitting: Family Medicine

## 2014-10-11 ENCOUNTER — Ambulatory Visit (INDEPENDENT_AMBULATORY_CARE_PROVIDER_SITE_OTHER)
Admission: RE | Admit: 2014-10-11 | Discharge: 2014-10-11 | Disposition: A | Discharge status: Home / Self Care | Payer: Medicare Other | Source: Ambulatory Visit | Attending: Family Medicine | Admitting: Family Medicine

## 2014-10-11 ENCOUNTER — Ambulatory Visit (INDEPENDENT_AMBULATORY_CARE_PROVIDER_SITE_OTHER): Payer: Medicare Other | Admitting: Family Medicine

## 2014-10-11 VITALS — BP 144/90 | HR 81 | Temp 98.5°F | Ht 67.5 in | Wt 141.5 lb

## 2014-10-11 DIAGNOSIS — S92152D Displaced avulsion fracture (chip fracture) of left talus, subsequent encounter for fracture with routine healing: Secondary | ICD-10-CM

## 2014-10-11 DIAGNOSIS — M25572 Pain in left ankle and joints of left foot: Secondary | ICD-10-CM | POA: Diagnosis not present

## 2014-10-11 DIAGNOSIS — S92102D Unspecified fracture of left talus, subsequent encounter for fracture with routine healing: Secondary | ICD-10-CM | POA: Diagnosis not present

## 2014-10-11 DIAGNOSIS — S92002D Unspecified fracture of left calcaneus, subsequent encounter for fracture with routine healing: Secondary | ICD-10-CM

## 2014-10-11 NOTE — Progress Notes (Signed)
Pre visit review using our clinic review tool, if applicable. No additional management support is needed unless otherwise documented below in the visit note. 

## 2014-10-11 NOTE — Progress Notes (Signed)
Dr. Frederico Hamman T. Yusuf Yu, MD, [facility] [place], [address] Phone: [phone] Fax: [phone]  10/11/2014  Patient: LETRICIA Mcconnell, MRN: [id number], DOB: 02/24/1951, 63 y.o.  Primary Physician:  Sherryl Barters, NP  Chief Complaint: Follow-up  Subjective:   Amy Mcconnell is a 63 y.o. very pleasant female patient who presents with the following:  Foot feels kind of tight. Wearing CAM walker incorrectly. She had a hard plastic part or rectally on her foot. She has been doing this for weeks now. She is walking without any kind difficulty or problem   09/13/2014 Last OV with Owens Loffler, MD  The patient is doing quite a bit better compared to the last time that I saw her.  Her pain level is much better, her swelling is down, and she is ambulating without any kind of difficulty in her walking boot.  She still has tenderness in the foot and ankle, but it is much better.  08/23/2014 Last OV with Owens Loffler, MD  DOI: 08/19/2014 Consulting MD: Dr. Silvio Pate  Planting bushes and moving a birdbath, went from the dining room step. L foot turned under her, missed a step and she came down onto that foot. Some of the details are not entirely clear due to pain surrounding the trauma.    08/20/2014, She saw Dr. Silvio Pate who placed her in a short pneumatic CAM walker and made her Non-weightbearing. She has been using crutches since that time. She is somewhat unsteady on crutches and has concerns about falling.   She still has 2 dogwoods to plant.   Past Medical History, Surgical History, Social History, Family History, Problem List, Medications, and Allergies have been reviewed and updated if relevant.  GEN: No fevers, chills. Nontoxic. Primarily MSK c/o today. MSK: Detailed in the HPI GI: tolerating PO intake without difficulty Neuro: No numbness, parasthesias, or tingling associated. Otherwise the pertinent positives of the  ROS are noted above.   Objective:   BP 144/90 mmHg  Pulse 81  Temp(Src) 98.5 F (36.9 C) (Oral)  Ht 5' 7.5" (1.715 m)  Wt 141 lb 8 oz (64.184 kg)  BMI 21.82 kg/m2   GEN: WDWN, NAD, Non-toxic, Alert & Oriented x 3 HEENT: Atraumatic, Normocephalic.  Ears and Nose: No external deformity. PSYCH: Normally interactive. Conversant. Not depressed or anxious appearing.  Calm demeanor.   FEET: L Echymosis: much improved Edema: mild ROM: much improved, but not full Gait:limited MT pain: no Callus pattern: none Lateral Mall: NT Medial Mall: NT Talus: TTP DORSALLY, improved Navicular: NT Cuboid: NT Calcaneous: LATERAL TENDERNESS, much improved Metatarsals: NT 5th MT: NT Phalanges: NT Achilles: NT Plantar Fascia: NT Fat Pad: NT Peroneals: NT Post Tib: NT Great Toe: Nml motion Ant Drawer: neg ATFL: NT CFL: NT Deltoid: TENDER TO PALPATION, improved Sensation: intact   Radiology: Dg Ankle Complete Left  10/11/2014   CLINICAL DATA:  Followup fracture, foot and ankle pain  EXAM: LEFT ANKLE COMPLETE - 3+ VIEW  COMPARISON:  08/23/2014  FINDINGS: Three views of the left ankle submitted. No acute fracture or subluxation. There is healed fracture of dorsal talus. Ankle mortise is preserved.  IMPRESSION: No acute fracture or subluxation.  Healed fracture of dorsal talus.   Electronically Signed   By: Lahoma Crocker M.D.   On: 10/11/2014 11:05   Dg Foot Complete Left  10/11/2014   CLINICAL DATA:  Follow-up foot fracture.  Pain  EXAM: LEFT FOOT -  COMPLETE 3+ VIEW  COMPARISON:  08/23/2014  FINDINGS: Three views of left foot submitted. No acute fracture or subluxation. There is healing fracture dorsal aspect of the talus with alignment preserved. Small plantar spur of calcaneus.  IMPRESSION: Healing fracture dorsal aspect of the talus with alignment preserved. Small plantar spur of calcaneus. No new fracture or subluxation   Electronically Signed   By: Lahoma Crocker M.D.   On: 10/11/2014 11:07      Assessment and Plan:   Avulsion fracture of calcaneus, left, with routine healing, subsequent encounter  Avulsion fracture of left talus, with routine healing, subsequent encounter  Much improved overall. She is having more pain than I would have anticipated. Fractures look well. I am suspicious that her wearing her boot incorrectly has delayed healing. I reviewed its use, and I will recheck her back in 4 weeks.  Signed,  Maud Deed. [name], MD   Patient's Medications  New Prescriptions   No medications on file  Previous Medications   AMLODIPINE (NORVASC) 5 MG TABLET    Take 1 tablet (5 mg total) by mouth daily.   ASCORBIC ACID (VITAMIN C) 1000 MG TABLET    Take 1,000 mg by mouth daily.   ASPIRIN 81 MG TABLET    Take 81 mg by mouth daily.   ATORVASTATIN (LIPITOR) 40 MG TABLET    Take 1 tablet (40 mg total) by mouth daily.   B COMPLEX VITAMINS (VITAMIN B COMPLEX PO)    Take by mouth daily.   CALCIUM CARBONATE (OS-CAL) 600 MG TABS TABLET    Take 600 mg by mouth 2 (two) times daily with a meal.   CHOLECALCIFEROL (VITAMIN D PO)    Take 1 tablet by mouth every morning.    DIPHENHYDRAMINE (BENADRYL) 25 MG CAPSULE    Take 25 mg by mouth daily as needed for allergies.   FEXOFENADINE (ALLEGRA) 180 MG TABLET    Take 180 mg by mouth 2 (two) times daily.    LEVOTHYROXINE (SYNTHROID, LEVOTHROID) 125 MCG TABLET    Take 125 mcg by mouth daily before breakfast.   LITHIUM CARBONATE (ESKALITH) 450 MG CR TABLET    Take by mouth every evening.    MAGNESIUM OXIDE (MAG-OX) 400 MG TABLET    Take 400 mg by mouth daily.   MULTIPLE VITAMINS-MINERALS (MULTIVITAMIN WITH MINERALS) TABLET    Take 1 tablet by mouth daily.   MULTIPLE VITAMINS-MINERALS (VISION FORMULA/LUTEIN) TABS    Take by mouth.   NAPROXEN SODIUM (ALEVE) 220 MG TABLET    Take 440 mg by mouth every 8 (eight) hours.   OMEGA-3 FATTY ACIDS (FISH OIL) 1000 MG CAPS    Take by mouth daily.   OXCARBAZEPINE (TRILEPTAL) 300 MG TABLET    Take 300 mg one  tablet in the am & two tablets in the pm.   PROPRANOLOL (INDERAL) 20 MG TABLET    Take 1 tablet (20 mg total) by mouth 3 (three) times daily as needed (for tachycardia).   VITAMIN B-12 (CYANOCOBALAMIN) 1000 MCG TABLET    Take 1,000 mcg by mouth daily.   VITAMIN E 1000 UNIT CAPSULE    Take 1,000 Units by mouth daily.   ZINC GLUCONATE 50 MG TABLET    Take 50 mg by mouth daily.   ZIPRASIDONE (GEODON) 60 MG CAPSULE    Take 60 mg by mouth daily.  Modified Medications   No medications on file  Discontinued Medications   No medications on file

## 2014-10-15 ENCOUNTER — Encounter: Payer: Self-pay | Admitting: Physician Assistant

## 2014-10-15 ENCOUNTER — Ambulatory Visit (INDEPENDENT_AMBULATORY_CARE_PROVIDER_SITE_OTHER): Payer: Medicare Other | Admitting: Physician Assistant

## 2014-10-15 VITALS — BP 150/95 | HR 76 | Ht 67.5 in | Wt 143.5 lb

## 2014-10-15 DIAGNOSIS — I10 Essential (primary) hypertension: Secondary | ICD-10-CM | POA: Diagnosis not present

## 2014-10-15 DIAGNOSIS — F419 Anxiety disorder, unspecified: Secondary | ICD-10-CM | POA: Diagnosis not present

## 2014-10-15 DIAGNOSIS — R000 Tachycardia, unspecified: Secondary | ICD-10-CM

## 2014-10-15 DIAGNOSIS — S92002D Unspecified fracture of left calcaneus, subsequent encounter for fracture with routine healing: Secondary | ICD-10-CM

## 2014-10-15 MED ORDER — AMLODIPINE BESYLATE 10 MG PO TABS: 10.0000 mg | ORAL_TABLET | Freq: Every day | ORAL | Status: DC

## 2014-10-15 NOTE — Patient Instructions (Addendum)
-Go up on your Norvasc to 10 mg daily -Call your psychiatrist today for an appointment  -Follow up in 1 month  Hypertension Hypertension, commonly called high blood pressure, is when the force of blood pumping through your arteries is too strong. Your arteries are the blood vessels that carry blood from your heart throughout your body. A blood pressure reading consists of a higher number over a lower number, such as 110/72. The higher number (systolic) is the pressure inside your arteries when your heart pumps. The lower number (diastolic) is the pressure inside your arteries when your heart relaxes. Ideally you want your blood pressure below 120/80. Hypertension forces your heart to work harder to pump blood. Your arteries may become narrow or stiff. Having hypertension puts you at risk for heart disease, stroke, and other problems.  RISK FACTORS Some risk factors for high blood pressure are controllable. Others are not.  Risk factors you cannot control include:   Race. You may be at higher risk if you are African American.  Age. Risk increases with age.  Gender. Men are at higher risk than women before age 11 years. After age 24, women are at higher risk than men. Risk factors you can control include:  Not getting enough exercise or physical activity.  Being overweight.  Getting too much fat, sugar, calories, or salt in your diet.  Drinking too much alcohol. SIGNS AND SYMPTOMS Hypertension does not usually cause signs or symptoms. Extremely high blood pressure (hypertensive crisis) may cause headache, anxiety, shortness of breath, and nosebleed. DIAGNOSIS  To check if you have hypertension, your health care provider will measure your blood pressure while you are seated, with your arm held at the level of your heart. It should be measured at least twice using the same arm. Certain conditions can cause a difference in blood pressure between your right and left arms. A blood pressure  reading that is higher than normal on one occasion does not mean that you need treatment. If one blood pressure reading is high, ask your health care provider about having it checked again. TREATMENT  Treating high blood pressure includes making lifestyle changes and possibly taking medicine. Living a healthy lifestyle can help lower high blood pressure. You may need to change some of your habits. Lifestyle changes may include:  Following the DASH diet. This diet is high in fruits, vegetables, and whole grains. It is low in salt, red meat, and added sugars.  Getting at least 2 hours of brisk physical activity every week.  Losing weight if necessary.  Not smoking.  Limiting alcoholic beverages.  Learning ways to reduce stress. If lifestyle changes are not enough to get your blood pressure under control, your health care provider may prescribe medicine. You may need to take more than one. Work closely with your health care provider to understand the risks and benefits. HOME CARE INSTRUCTIONS  Have your blood pressure rechecked as directed by your health care provider.   Take medicines only as directed by your health care provider. Follow the directions carefully. Blood pressure medicines must be taken as prescribed. The medicine does not work as well when you skip doses. Skipping doses also puts you at risk for problems.   Do not smoke.   Monitor your blood pressure at home as directed by your health care provider. SEEK MEDICAL CARE IF:   You think you are having a reaction to medicines taken.  You have recurrent headaches or feel dizzy.  You have swelling  in your ankles.  You have trouble with your vision. SEEK IMMEDIATE MEDICAL CARE IF:  You develop a severe headache or confusion.  You have unusual weakness, numbness, or feel faint.  You have severe chest or abdominal pain.  You vomit repeatedly.  You have trouble breathing. MAKE SURE YOU:   Understand these  instructions.  Will watch your condition.  Will get help right away if you are not doing well or get worse. Document Released: 10/22/2005 Document Revised: 03/08/2014 Document Reviewed: 08/14/2013 [employer] Patient Information 2015 [place], [place]. This information is not intended to replace advice given to you by your health care provider. Make sure you discuss any questions you have with your health care provider.

## 2014-10-15 NOTE — Progress Notes (Signed)
Patient Name: Amy Mcconnell, Amy Mcconnell 1951-10-23, MRN [id number]  Date of Encounter: 10/16/2014  Primary Care Provider:  Deborra Medina, MD Primary Cardiologist:  Dr. Rockey Situ, MD  Patient Profile:  63 y.o. female with history of HTN, remote 15 year tobacco abuse (quit many years ago), isolated episode of tachycardia who is her for HTN follow up.    Problem List:   Past Medical History  Diagnosis Date  . Other specified disorders of thyroid   . Essential hypertension, benign   . Contusion of unspecified part of lower limb   . Other and unspecified hyperlipidemia   . Contact dermatitis and other eczema due to other specified agent   . Reflux esophagitis   . Bipolar disorder     Dr. Thurmond Butts - every 3 months  . 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 Heart murmur   . Broken foot     left    Past Surgical History  Procedure Laterality Date  . Cataract extraction    . Salpingectomy Left   . Sterilization      Her decision since dz of bipolar     Allergies:  Allergies  Allergen Reactions  . Benicar [Olmesartan]     Mouth & Lip Swelling  . Poison Ivy Extract [Extract Of Poison Ivy]     Blisters     HPI:  63 y.o. female with history of HTN, remote 15 year tobacco abuse (quit many years ago), isolated episode of tachycardia (symptoms resolved before evaluation - negative ED work up) who is here for HTN follow up. who has been noticing on many of her last clinic visit her BP has been elevated, 160 or higher at times. She reports significant stress recently at home as her ex-husband is in the hospital and recently moved to a nursing home. She has also recently suffered a recent left foot fracture that is healing well. She does not typically take her BP medication as a previous antihypertensive (Benicar) caused angioedema. She brought her BP cuff into our office on 12/4 for comparison readings and her machine was accurate. Unfortunately, her BP was in the 190s/1-teens in  all 3 readings. She was noted to have a headache at that time but denied all other symptoms.  She recently saw Dr. Lorelei Pont for follow up foot fracture on 12/7 and her BP was noted to be 144/90 at that time.   She comes in today working on decreasing her stressors. She feels like stress in the root cause of the recent bump in her BP. She has found a new lawn care guy, her ex-husband's health is getting better, she is adopting a kitten 12/12. She plans to discuss these issues with her psychiatrist. No recent headaches, chest pain, vision changes, or focal deficits.     Home Medications:  Prior to Admission medications   Medication Sig Start Date End Date Taking? Authorizing Provider  amLODipine (NORVASC) 5 MG tablet Take 1 tablet (5 mg total) by mouth daily. 10/08/14   Rogelia Mire, NP  Ascorbic Acid (VITAMIN C) 1000 MG tablet Take 1,000 mg by mouth daily.    Historical Provider, MD  aspirin 81 MG tablet Take 81 mg by mouth daily.    Historical Provider, MD  atorvastatin (LIPITOR) 40 MG tablet Take 1 tablet (40 mg total) by mouth daily. 09/07/14   Deborra Medina, MD  B Complex Vitamins (VITAMIN B COMPLEX PO) Take by mouth daily.    Historical Provider, MD  calcium carbonate (  OS-CAL) 600 MG TABS tablet Take 600 mg by mouth 2 (two) times daily with a meal.    Historical Provider, MD  Cholecalciferol (VITAMIN D PO) Take 1 tablet by mouth every morning.     Historical Provider, MD  diphenhydrAMINE (BENADRYL) 25 mg capsule Take 25 mg by mouth daily as needed for allergies.    Historical Provider, MD  fexofenadine (ALLEGRA) 180 MG tablet Take 180 mg by mouth 2 (two) times daily.     Historical Provider, MD  levothyroxine (SYNTHROID, LEVOTHROID) 125 MCG tablet Take 125 mcg by mouth daily before breakfast.    Historical Provider, MD  lithium carbonate (ESKALITH) 450 MG CR tablet Take by mouth every evening.     Historical Provider, MD  magnesium oxide (MAG-OX) 400 MG tablet Take 400 mg by mouth daily.     Historical Provider, MD  Multiple Vitamins-Minerals (MULTIVITAMIN WITH MINERALS) tablet Take 1 tablet by mouth daily.    Historical Provider, MD  Multiple Vitamins-Minerals (VISION FORMULA/LUTEIN) TABS Take by mouth.    Historical Provider, MD  naproxen sodium (ALEVE) 220 MG tablet Take 440 mg by mouth every 8 (eight) hours.    Historical Provider, MD  Omega-3 Fatty Acids (FISH OIL) 1000 MG CAPS Take by mouth daily.    Historical Provider, MD  Oxcarbazepine (TRILEPTAL) 300 MG tablet Take 300 mg one tablet in the am & two tablets in the pm.    Historical Provider, MD  propranolol (INDERAL) 20 MG tablet Take 1 tablet (20 mg total) by mouth 3 (three) times daily as needed (for tachycardia). 01/22/14   Minna Merritts, MD  vitamin B-12 (CYANOCOBALAMIN) 1000 MCG tablet Take 1,000 mcg by mouth daily.    Historical Provider, MD  vitamin E 1000 UNIT capsule Take 1,000 Units by mouth daily.    Historical Provider, MD  zinc gluconate 50 MG tablet Take 50 mg by mouth daily.    Historical Provider, MD  ziprasidone (GEODON) 60 MG capsule Take 60 mg by mouth daily.    Historical Provider, MD     Weights: Filed Weights   10/15/14 1357  Weight: 143 lb 8 oz (65.091 kg)     Review of Systems:  As above.  All other systems reviewed and are otherwise negative except as noted above.  Physical Exam:  Blood pressure 150/95, pulse 76, height 5' 7.5" (1.715 m), weight 143 lb 8 oz (65.091 kg).  General: Pleasant, NAD Psych: Normal affect. Neuro: Alert and oriented X 3. Moves all extremities spontaneously. HEENT: Normal  Neck: Supple without bruits or JVD. Lungs:  Resp regular and unlabored, CTA. Heart: RRR no s3, s4, or murmurs. Abdomen: Soft, non-tender, non-distended, BS + x 4.  Extremities: No clubbing, cyanosis or edema. Boot on left foot.    Assessment & Plan:  1. HTN: -Remains hypertensive -Increase Norvasc to 10 mg -Work on decreasing stress as above  2. Avulsion fracture left  foot: -Healing well -Followed by Dr. Lorelei Pont  3. Tachycardia: -No further episodes  4. Dispo: -Follow up 1 month   Christell Faith, PA-C [facility] [address] [place], [place] [id number] [phone] [place] 10/16/2014, 8:42 AM

## 2014-10-16 ENCOUNTER — Encounter: Payer: Self-pay | Admitting: Physician Assistant

## 2014-10-16 DIAGNOSIS — F419 Anxiety disorder, unspecified: Secondary | ICD-10-CM | POA: Insufficient documentation

## 2014-10-21 ENCOUNTER — Telehealth: Payer: Self-pay

## 2014-10-21 NOTE — Telephone Encounter (Signed)
Pt wants to know 1st date pt was seen when broke her foot; advised pt was seen on 08/20/14. Pt said that was all she needed.

## 2014-10-22 DIAGNOSIS — F3170 Bipolar disorder, currently in remission, most recent episode unspecified: Secondary | ICD-10-CM | POA: Diagnosis not present

## 2014-11-11 ENCOUNTER — Ambulatory Visit (INDEPENDENT_AMBULATORY_CARE_PROVIDER_SITE_OTHER): Payer: Medicare Other | Admitting: Family Medicine

## 2014-11-11 ENCOUNTER — Encounter: Payer: Self-pay | Admitting: Family Medicine

## 2014-11-11 VITALS — BP 160/100 | HR 61 | Temp 98.2°F | Ht 67.5 in | Wt 143.2 lb

## 2014-11-11 DIAGNOSIS — S92002D Unspecified fracture of left calcaneus, subsequent encounter for fracture with routine healing: Secondary | ICD-10-CM

## 2014-11-11 DIAGNOSIS — S92152D Displaced avulsion fracture (chip fracture) of left talus, subsequent encounter for fracture with routine healing: Secondary | ICD-10-CM | POA: Diagnosis not present

## 2014-11-11 NOTE — Progress Notes (Signed)
Pre visit review using our clinic review tool, if applicable. No additional management support is needed unless otherwise documented below in the visit note. 

## 2014-11-11 NOTE — Progress Notes (Signed)
Dr. Frederico Hamman T. Shamyra Farias, MD, [facility] [place], [address] Phone: [phone] Fax: [phone]  11/11/2014  Patient: Amy Mcconnell, MRN: [id number], DOB: 06/18/1951, 64 y.o.  Primary Physician:  Crecencio Mc, MD  Chief Complaint: Follow-up  Subjective:   Amy Mcconnell is a 64 y.o. very pleasant female patient who presents with the following:  F/u. Doing great. Pain is much better. She still has a mild amount of swelling, but overall her foot feels much better compared to any other time previously. She is able to take her [place] Rile often walk on this without problem. The areas where she did have a prior fractures are no longer tender.  10/11/2014 Last OV with Owens Loffler, MD  Foot feels kind of tight. Wearing CAM walker incorrectly. She had a hard plastic part on her foot. She has been doing this for weeks now. She is walking without any kind difficulty or problem   09/13/2014 Last OV with Owens Loffler, MD  The patient is doing quite a bit better compared to the last time that I saw her.  Her pain level is much better, her swelling is down, and she is ambulating without any kind of difficulty in her walking boot.  She still has tenderness in the foot and ankle, but it is much better.  08/23/2014 Last OV with Owens Loffler, MD  DOI: 08/19/2014 Consulting MD: Dr. Silvio Pate  Planting bushes and moving a birdbath, went from the dining room step. L foot turned under her, missed a step and she came down onto that foot. Some of the details are not entirely clear due to pain surrounding the trauma.    08/20/2014, She saw Dr. Silvio Pate who placed her in a short pneumatic CAM walker and made her Non-weightbearing. She has been using crutches since that time. She is somewhat unsteady on crutches and has concerns about falling.   She still has 2 dogwoods to plant.   Past Medical History, Surgical History, Social History,  Family History, Problem List, Medications, and Allergies have been reviewed and updated if relevant.  GEN: No fevers, chills. Nontoxic. Primarily MSK c/o today. MSK: Detailed in the HPI GI: tolerating PO intake without difficulty Neuro: No numbness, parasthesias, or tingling associated. Otherwise the pertinent positives of the ROS are noted above.   Objective:   BP 160/100 mmHg  Pulse 61  Temp(Src) 98.2 F (36.8 C) (Oral)  Ht 5' 7.5" (1.715 m)  Wt 143 lb 4 oz (64.978 kg)  BMI 22.09 kg/m2   GEN: WDWN, NAD, Non-toxic, Alert & Oriented x 3 HEENT: Atraumatic, Normocephalic.  Ears and Nose: No external deformity. PSYCH: Normally interactive. Conversant. Not depressed or anxious appearing.  Calm demeanor.   FEET: L Echymosis: much improved Edema: mild ROM: much improved, but not full Gait:limited MT pain: no Callus pattern: none Lateral Mall: NT Medial Mall: NT Talus: nt Navicular: NT Cuboid: NT Calcaneous: nt Metatarsals: NT 5th MT: NT Phalanges: NT Achilles: NT Plantar Fascia: NT Fat Pad: NT Peroneals: NT Post Tib: NT Great Toe: Nml motion Ant Drawer: neg ATFL: NT CFL: NT Deltoid: nt Sensation: intact   Radiology: No results found.   Assessment and Plan:   Avulsion fracture of calcaneus, left, with routine healing, subsequent encounter  Avulsion fracture of left talus, with routine healing, subsequent encounter   She is doing great. Discontinue her boot. 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  Recommend no more than 2 hours at the hospital. Recommend no more than 2-3 hours at hospice at a time additionally.  Offered physical therapy, but the patient declined. I reviewed a ankle rehabilitation program from [place].  F/u prn  Signed,  Weslie Pretlow T. Berdell Hostetler, MD   Patient's Medications  New Prescriptions   No medications on file  Previous Medications   AMLODIPINE (NORVASC) 10 MG TABLET    Take 1 tablet (10 mg total) by  mouth daily.   ASCORBIC ACID (VITAMIN C) 1000 MG TABLET    Take 1,000 mg by mouth daily.   ASPIRIN 81 MG TABLET    Take 81 mg by mouth daily.   ATORVASTATIN (LIPITOR) 40 MG TABLET    Take 1 tablet (40 mg total) by mouth daily.   B COMPLEX VITAMINS (VITAMIN B COMPLEX PO)    Take by mouth daily.   CALCIUM CARBONATE (OS-CAL) 600 MG TABS TABLET    Take 600 mg by mouth 2 (two) times daily with a meal.   CHOLECALCIFEROL (VITAMIN D PO)    Take 1 tablet by mouth every morning.    DIPHENHYDRAMINE (BENADRYL) 25 MG CAPSULE    Take 25 mg by mouth daily as needed for allergies.   FEXOFENADINE (ALLEGRA) 180 MG TABLET    Take 180 mg by mouth 2 (two) times daily.    LEVOTHYROXINE (SYNTHROID, LEVOTHROID) 125 MCG TABLET    Take 125 mcg by mouth daily before breakfast.   LITHIUM CARBONATE (ESKALITH) 450 MG CR TABLET    Take by mouth every evening.    MAGNESIUM OXIDE (MAG-OX) 400 MG TABLET    Take 400 mg by mouth daily.   MULTIPLE VITAMINS-MINERALS (MULTIVITAMIN WITH MINERALS) TABLET    Take 1 tablet by mouth daily.   MULTIPLE VITAMINS-MINERALS (VISION FORMULA/LUTEIN) TABS    Take by mouth.   NAPROXEN SODIUM (ALEVE) 220 MG TABLET    Take 440 mg by mouth every 8 (eight) hours.   OMEGA-3 FATTY ACIDS (FISH OIL) 1000 MG CAPS    Take by mouth daily.   OXCARBAZEPINE (TRILEPTAL) 300 MG TABLET    Take 300 mg one tablet in the am & two tablets in the pm.   PROPRANOLOL (INDERAL) 20 MG TABLET    Take 1 tablet (20 mg total) by mouth 3 (three) times daily as needed (for tachycardia).   VITAMIN B-12 (CYANOCOBALAMIN) 1000 MCG TABLET    Take 1,000 mcg by mouth daily.   VITAMIN E 1000 UNIT CAPSULE    Take 1,000 Units by mouth daily.   ZINC GLUCONATE 50 MG TABLET    Take 50 mg by mouth daily.   ZIPRASIDONE (GEODON) 40 MG CAPSULE    Take 40 mg by mouth daily.   ZIPRASIDONE (GEODON) 60 MG CAPSULE    Take 60 mg by mouth daily.  Modified Medications   No medications on file  Discontinued Medications   No medications on file

## 2014-11-15 ENCOUNTER — Encounter: Payer: Self-pay | Admitting: Cardiovascular Disease

## 2014-11-15 ENCOUNTER — Ambulatory Visit (INDEPENDENT_AMBULATORY_CARE_PROVIDER_SITE_OTHER): Payer: Medicare Other | Admitting: Cardiovascular Disease

## 2014-11-15 VITALS — BP 150/90 | HR 62 | Ht 67.5 in | Wt 142.5 lb

## 2014-11-15 DIAGNOSIS — E785 Hyperlipidemia, unspecified: Secondary | ICD-10-CM | POA: Diagnosis not present

## 2014-11-15 DIAGNOSIS — Z87891 Personal history of nicotine dependence: Secondary | ICD-10-CM

## 2014-11-15 DIAGNOSIS — R0789 Other chest pain: Secondary | ICD-10-CM | POA: Diagnosis not present

## 2014-11-15 DIAGNOSIS — Z720 Tobacco use: Secondary | ICD-10-CM

## 2014-11-15 DIAGNOSIS — R000 Tachycardia, unspecified: Secondary | ICD-10-CM | POA: Diagnosis not present

## 2014-11-15 DIAGNOSIS — I10 Essential (primary) hypertension: Secondary | ICD-10-CM

## 2014-11-15 MED ORDER — HYDROCHLOROTHIAZIDE 25 MG PO TABS: 25.0000 mg | ORAL_TABLET | Freq: Every day | ORAL | Status: DC

## 2014-11-15 NOTE — Assessment & Plan Note (Signed)
We have encouraged continued smoking cessation. She does not want any assistance with chantix.

## 2014-11-15 NOTE — Assessment & Plan Note (Signed)
Suggested that she stay on her Lipitor.

## 2014-11-15 NOTE — Assessment & Plan Note (Signed)
Blood pressure continues to run high. We'll avoid ACE and ARBS given previous angioedema symptoms, she has severe dry mouth and will avoid clonidine. Suggested she try HCTZ 25 mg daily. If this makes her dry mouth worse, could try long-acting nitrates, doxazosin. Heart rate is borderline low, likely not much room for beta blockers though could try bystolic.

## 2014-11-15 NOTE — Patient Instructions (Addendum)
You are doing well. Please start HCTZ once a day  Please call us if you have new issues that need to be addressed before your next appt.  Your physician wants you to follow-up in: 3 months.  You will receive a reminder letter in the mail two months in advance. If you don't receive a letter, please call our office to schedule the follow-up appointment.
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
## 2014-11-15 NOTE — Assessment & Plan Note (Signed)
Denies having any symptoms of tachycardia on today's visit.  suggested she take propranolol when necessary

## 2014-11-22 DIAGNOSIS — Z79899 Other long term (current) drug therapy: Secondary | ICD-10-CM | POA: Diagnosis not present

## 2014-11-23 ENCOUNTER — Telehealth: Payer: Self-pay

## 2014-11-23 LAB — BASIC METABOLIC PANEL
CREATININE: 0.9 mg/dL (ref 0.5–1.1)
Potassium: 4.1 mmol/L (ref 3.4–5.3)
Sodium: 143 mmol/L (ref 137–147)

## 2014-11-23 NOTE — Telephone Encounter (Signed)
Pt states when she was in last week Dr. Rockey Situ was going to find a second medication to start her on, and she is following up on that. Please call and advise

## 2014-11-24 DIAGNOSIS — F3170 Bipolar disorder, currently in remission, most recent episode unspecified: Secondary | ICD-10-CM | POA: Diagnosis not present

## 2014-11-25 ENCOUNTER — Telehealth: Payer: Self-pay | Admitting: Cardiovascular Disease

## 2014-11-25 NOTE — Telephone Encounter (Signed)
Attempted to contact pt again.  No answer, voicemail box not set up.

## 2014-11-25 NOTE — Telephone Encounter (Signed)
Spoke w/ pt.  She reports that he BP is still elevated, but she thinks it is due to her naproxen.  She is switching to Tylenol today, will monitor her BP over the weekend and call Monday if still elevated.

## 2014-11-25 NOTE — Telephone Encounter (Signed)
Patient has been taking naproxen for pain and thinks this is keeping her bp consistently high (153/94 last reading) Patient also mentioned that she is taking bp several times in a row on the same arm and getting different readings every time.  Please call patient as she is concerned about consistently high pressure and if she can switch pain medicine to something different like tylenol.

## 2014-11-25 NOTE — Telephone Encounter (Signed)
I think HCTZ was started on last visit in addition to her amlodipine. How is BP? Dry mouth? May not need any other pills if BP ok

## 2014-11-25 NOTE — Telephone Encounter (Signed)
Attempted to contact pt.  Voicemail box not set up.

## 2014-11-30 ENCOUNTER — Telehealth: Payer: Self-pay | Admitting: Cardiovascular Disease

## 2014-11-30 NOTE — Telephone Encounter (Signed)
Pt c/o BP issue: STAT if pt c/o blurred vision, one-sided weakness or slurred speech  1. What are your last 5 BP readings?  11/27/14: R 157/100 L 154/94   11/28/14: R 155/96 L 161/93  11/29/14: R 166/109 L1 27/109 11/29/14:  R158/108 L 166/104  11/30/14: R Bad reading L 166/108  2. Are you having any other symptoms (ex. Dizziness, headache, blurred vision, passed out)? none  3. What is your BP issue? Patient just calling to give readings

## 2014-12-01 ENCOUNTER — Telehealth: Payer: Self-pay | Admitting: Cardiovascular Disease

## 2014-12-01 NOTE — Telephone Encounter (Signed)
Jolinda Croak at 12/01/2014 8:53 AM     Status: Signed       Expand All Collapse All   Pt c/o medication issue:  1. Name of Medication: aspirin (for foot pain) Tylenol, benadryl   2. How are you currently taking this medication (dosage and times per day)? Wants to take 2 extra strength aspirin or tylenol with benadryl   3. Are you having a reaction (difficulty breathing--STAT)? no  4. What is your medication issue? Patient wants advice on what OTC to take for pain in combination with benadryl which she takes everyday and allegra everyday

## 2014-12-01 NOTE — Telephone Encounter (Signed)
Unclear if she is taking HCTZ? BP is elevated, would start cardura 4 mg QHS Monitor BP

## 2014-12-01 NOTE — Telephone Encounter (Signed)
Pt c/o medication issue:  1. Name of Medication: aspirin (for foot pain)  Tylenol,  benadryl   2. How are you currently taking this medication (dosage and times per day)? Wants to take 2 extra strength aspirin or tylenol with benadryl   3. Are you having a reaction (difficulty breathing--STAT)? no  4. What is your medication issue? Patient wants advice on what OTC to take for pain in combination with benadryl which she takes everyday and allegra everyday

## 2014-12-01 NOTE — Telephone Encounter (Signed)
Please see previous phone note.  

## 2014-12-02 ENCOUNTER — Other Ambulatory Visit: Payer: Self-pay

## 2014-12-02 MED ORDER — DOXAZOSIN MESYLATE 4 MG PO TABS: 4.0000 mg | ORAL_TABLET | Freq: Every day | ORAL | Status: DC

## 2014-12-02 NOTE — Telephone Encounter (Signed)
Spoke w/ pt. She reports her BP this am in rt arm 129/82, left arm 173/98. She is unsure if she is positioning cuff correctly. Advised her of Dr. Donivan Scull recommendation.  She is agreeable and states that she has been taking her HCTZ as prescribed. Reports that she is no longer wearing boot on her foot s/p surgery and that she stepped down too hard on her foot and caused a significant amount of pain. She was taking benadryl & tylenol, but switched to and ES aspirin, as the label said not to take them together.

## 2014-12-03 ENCOUNTER — Telehealth: Payer: Self-pay | Admitting: Cardiovascular Disease

## 2014-12-03 NOTE — Telephone Encounter (Signed)
Pt called stating that we added one more med for her, and now bp has come down since yesterday Right arm 156/90 155/93 left arm   She will continue to take readings until Tuesday and would like to come in to office, since she got a new bp machine, and would like [place] to show her how to correctly take her bp.  Please call patient.

## 2014-12-07 ENCOUNTER — Encounter: Payer: Self-pay | Admitting: Internal Medicine

## 2014-12-08 ENCOUNTER — Telehealth: Payer: Self-pay | Admitting: Cardiovascular Disease

## 2014-12-08 NOTE — Telephone Encounter (Signed)
Blood pressure readings  12-08-14   Morning :  L 146/94 R 147/87

## 2014-12-09 ENCOUNTER — Telehealth: Payer: Self-pay

## 2014-12-09 MED ORDER — DOXAZOSIN MESYLATE 4 MG PO TABS: 4.0000 mg | ORAL_TABLET | Freq: Every day | ORAL | Status: DC

## 2014-12-09 NOTE — Telephone Encounter (Signed)
I tend to split the Cardura into twice a day dosing 4 mg twice a day, 8 mg twice a day if needed

## 2014-12-09 NOTE — Telephone Encounter (Signed)
Patient can increase her Cardura to 8 mg nightly. If her BP continues to be elevated a few days following this she may increase her HCTZ to 37.5 mg daily, unless she feels like this is drying her mouth out too much. If that is the case we can titrate her Cardura up to 16 mg qhs over the next few days.

## 2014-12-09 NOTE — Telephone Encounter (Signed)
Left detailed message on pt's vm w/ Ryan's recommendation.  Asked her to continue to monitor her BP and call back w/ any questions or concerns.

## 2014-12-09 NOTE — Telephone Encounter (Signed)
Pt called with BP readings: 9:43 pm 2/3- left 171/107, right 160/99 6:15 am 2/4/- left 164/96, right 162/101

## 2014-12-09 NOTE — Telephone Encounter (Signed)
Spoke w/ pt.  Advised her of Dr. Donivan Scull recommendation.  She is agreeable, though after discussion, pt realizes that she has not picked up cardura rx from her pharmacy.  Advised her that this was sent in on 12/02/14. Pt went through her meds again and reports that she does not have it.  Advised her that I am resending rx to her pharmacy and for her to take 4 mg when she picks it up.  She is agreeable and will call back w/ BP readings.

## 2014-12-10 ENCOUNTER — Telehealth: Payer: Self-pay | Admitting: *Deleted

## 2014-12-10 ENCOUNTER — Other Ambulatory Visit: Payer: Self-pay | Admitting: *Deleted

## 2014-12-10 MED ORDER — HYDROCHLOROTHIAZIDE 25 MG PO TABS: 25.0000 mg | ORAL_TABLET | Freq: Every day | ORAL | Status: DC

## 2014-12-10 NOTE — Telephone Encounter (Signed)
Pt is aware that Doxazosin is the same as Cardura. Pt needed refill for HCTZ sent to local pharmacy.

## 2014-12-10 NOTE — Telephone Encounter (Signed)
Pt calling stating that she only has the following medications:   1. Doxazosin given to her on jan 28  2. Amlodipine given to her on  jan 6 no refills  Pt states she does not have Cardura Thinks that this may be the cause of her problems with BP. She also states that she's' eaten something that she shouldn't have eaten. Has really bad allergies. Thus she has Taken 1 allegra and 10 benadryl She is really "afraid" to take her BP So she is just letting us know that If we could just send in Cardura to [facility] on [address] it would be helpful.

## 2014-12-11 ENCOUNTER — Telehealth: Payer: Self-pay | Admitting: Nurse Practitioner

## 2014-12-11 NOTE — Telephone Encounter (Signed)
   Pt called this AM to report that BP is 172/94.  She took amlodipine 10 mg last night and this AM took hctz 25 mg and cardura 4 mg @ about 6 am.  I reviewed Dr. Donivan Scull recent phone notes.  Pt is anxious.  Said she cheated on her diet and ate cheese and then had a difficult night 2/2 restlessness - upset with herself 2/2 cheating on diet.  I rec that she may go ahead and increase her am cardura dose to 8 mg daily, thus take an additional 4 mg this am.    Caller verbalized understanding and was grateful for the call back.  Murray Hodgkins, NP 12/11/2014, 7:23 AM

## 2014-12-13 ENCOUNTER — Telehealth: Payer: Self-pay

## 2014-12-13 MED ORDER — DOXAZOSIN MESYLATE 4 MG PO TABS: 4.0000 mg | ORAL_TABLET | Freq: Two times a day (BID) | ORAL | Status: DC

## 2014-12-13 NOTE — Telephone Encounter (Signed)
Pt calling with BP readings: Sat -left 170/132, right-172/94, states she called the on call Dr., he advised her to take 2 Cardura Sunday- left 147/92, right 140/88 Today- left 192/109, right 197/109, states her cats have fought this a.m. And she knows this has upset her. She just took her meds

## 2014-12-13 NOTE — Telephone Encounter (Signed)
Spoke w/ pt.  She reports that her BP has been better, but she has been very anxious. She reports that she was upset w/ herself this weekend b/c she cheated on her diet.  Reports that she recently adopted a new kitten and one of her older cats does not get along w/ it.  She states that she becomes stressed out when they fight.  Reports that she has increased her cardura to 4 mg BID w/ good results, but her BP becomes elevated due to her anxiety.  She requests a referral to a meditation center to see if this will help her deal w/ her stressors.  Provided pt w/ # to local centers and asked her to call back w/ any questions or concerns.

## 2014-12-13 NOTE — Telephone Encounter (Signed)
Pt needs a letter stating she can do yoga stretching, breathing and relaxation exercises. No bending over. States her BP was elevated due to her doing toe touches yesterday x 100, constant bending over. Please call.

## 2014-12-14 ENCOUNTER — Ambulatory Visit (INDEPENDENT_AMBULATORY_CARE_PROVIDER_SITE_OTHER): Payer: Medicare Other | Admitting: Nurse Practitioner

## 2014-12-14 ENCOUNTER — Encounter: Payer: Self-pay | Admitting: Nurse Practitioner

## 2014-12-14 ENCOUNTER — Telehealth: Payer: Self-pay | Admitting: Cardiovascular Disease

## 2014-12-14 ENCOUNTER — Ambulatory Visit (INDEPENDENT_AMBULATORY_CARE_PROVIDER_SITE_OTHER): Payer: Medicare Other

## 2014-12-14 VITALS — BP 148/88 | HR 96 | Ht 67.0 in | Wt 144.2 lb

## 2014-12-14 VITALS — BP 118/70 | HR 82 | Ht 67.5 in | Wt 143.8 lb

## 2014-12-14 DIAGNOSIS — I159 Secondary hypertension, unspecified: Secondary | ICD-10-CM | POA: Diagnosis not present

## 2014-12-14 DIAGNOSIS — I10 Essential (primary) hypertension: Secondary | ICD-10-CM

## 2014-12-14 MED ORDER — CARVEDILOL 3.125 MG PO TABS: 3.1250 mg | ORAL_TABLET | Freq: Two times a day (BID) | ORAL | Status: DC

## 2014-12-14 NOTE — Telephone Encounter (Signed)
Patient here in office and wants to have blood pressure checked .  Thinks she may need to be admitted.

## 2014-12-14 NOTE — Telephone Encounter (Signed)
Pt coming in today to see Ignacia Bayley, NP.

## 2014-12-14 NOTE — Progress Notes (Signed)
Patient Name: Amy Mcconnell Date of Encounter: 12/14/2014  Primary Care Provider:  Crecencio Mc, MD Primary Cardiologist:  Johnny Bridge, MD   Chief Complaint  64 y/o female with a h/o labile HTN who presents 2/2 persistent lability.  Past Medical History   Past Medical History  Diagnosis Date  . Other specified disorders of thyroid   . Essential hypertension, benign   . Contusion of unspecified part of lower limb   . Other and unspecified hyperlipidemia   . Contact dermatitis and other eczema due to other specified agent   . Reflux esophagitis   . Bipolar disorder     Dr. Thurmond Butts - every 3 months  . 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 Heart murmur   . Broken foot     left   . Anxiety   . Tachycardia     a. isolated episode, seen in ED with negative work up   Past Surgical History  Procedure Laterality Date  . Cataract extraction    . Salpingectomy Left   . Sterilization      Her decision since dz of bipolar    Allergies  Allergies  Allergen Reactions  . Ace Inhibitors     Angioedema   . Angiotensin Receptor Blockers     Angioedema   . Benicar [Olmesartan]     Mouth & Lip Swelling, "I swell from the waist down"  . Poison Ivy Extract [Extract Of Poison Ivy]     Blisters    HPI  64 y/o female with the above problem list. She has a h/o labile HTN, palpitations, and anxiety.  She checks her BP @ home frequently and has been on a varied regimen over the years.  Since January, her blood pressure readings have been trending higher and she has been placed on HCTZ 25 mg daily and cardura, which was started @ 4 mg daily and subsequently titrated to 4 mg bid over this past weekend when she called in when I was on call and her pressure was in the 170's.  Pressures did improve following that switch but she's since been having increased stress at home r/t her cat attacking her new kitten.  This occurred this AM and she became very anxious and was concerned that  her BP was high and that she may need to be admitted.  She walked into the office and her BP was checked and was found to be 148/88.  She was reassured but asked to be added onto my schedule as she wishes to stop HCTZ due to polyuria.  BP this afternoon is 118/70.  She denies chest pain, palpitations, dyspnea, pnd, orthopnea, n, v, dizziness, syncope, edema, weight gain, or early satiety.    Home Medications  Prior to Admission medications   Medication Sig Start Date End Date Taking? Authorizing Provider  amLODipine (NORVASC) 10 MG tablet Take 1 tablet (10 mg total) by mouth daily. 10/15/14  Yes Ryan M Dunn, PA-C  Ascorbic Acid (VITAMIN C) 1000 MG tablet Take 1,000 mg by mouth daily.   Yes Historical Provider, MD  aspirin 81 MG tablet Take 81 mg by mouth daily.   Yes Historical Provider, MD  atorvastatin (LIPITOR) 40 MG tablet Take 1 tablet (40 mg total) by mouth daily. 09/07/14  Yes Crecencio Mc, MD  B Complex Vitamins (VITAMIN B COMPLEX PO) Take by mouth daily.   Yes Historical Provider, MD  calcium carbonate (OS-CAL) 600 MG TABS tablet Take 600 mg  by mouth 2 (two) times daily with a meal.   Yes Historical Provider, MD  Cholecalciferol (VITAMIN D PO) Take 1 tablet by mouth every morning.    Yes Historical Provider, MD  diphenhydrAMINE (BENADRYL) 25 mg capsule Take 25 mg by mouth daily as needed for allergies.   Yes Historical Provider, MD  doxazosin (CARDURA) 4 MG tablet Take 1 tablet (4 mg total) by mouth 2 (two) times daily. 12/13/14  Yes Minna Merritts, MD  fexofenadine (ALLEGRA) 180 MG tablet Take 180 mg by mouth 2 (two) times daily.    Yes Historical Provider, MD  levothyroxine (SYNTHROID, LEVOTHROID) 125 MCG tablet Take 125 mcg by mouth daily before breakfast.   Yes Historical Provider, MD  lithium carbonate (ESKALITH) 450 MG CR tablet Take by mouth every evening.    Yes Historical Provider, MD  magnesium oxide (MAG-OX) 400 MG tablet Take 400 mg by mouth daily.   Yes Historical Provider,  MD  Multiple Vitamins-Minerals (MULTIVITAMIN WITH MINERALS) tablet Take 1 tablet by mouth daily.   Yes Historical Provider, MD  Multiple Vitamins-Minerals (VISION FORMULA/LUTEIN) TABS Take by mouth.   Yes Historical Provider, MD  naproxen sodium (ALEVE) 220 MG tablet Take 440 mg by mouth every 8 (eight) hours.   Yes Historical Provider, MD  Omega-3 Fatty Acids (FISH OIL) 1000 MG CAPS Take by mouth daily.   Yes Historical Provider, MD  Oxcarbazepine (TRILEPTAL) 300 MG tablet Take 300 mg one tablet in the am & two tablets in the pm.   Yes Historical Provider, MD  propranolol (INDERAL) 20 MG tablet Take 1 tablet (20 mg total) by mouth 3 (three) times daily as needed (for tachycardia). 01/22/14  Yes Minna Merritts, MD  vitamin B-12 (CYANOCOBALAMIN) 1000 MCG tablet Take 1,000 mcg by mouth daily.   Yes Historical Provider, MD  vitamin E 1000 UNIT capsule Take 1,000 Units by mouth daily.   Yes Historical Provider, MD  zinc gluconate 50 MG tablet Take 50 mg by mouth daily.   Yes Historical Provider, MD  ziprasidone (GEODON) 40 MG capsule Take 40 mg by mouth daily.   Yes Historical Provider, MD  ziprasidone (GEODON) 60 MG capsule Take 60 mg by mouth daily.   Yes Historical Provider, MD  carvedilol (COREG) 3.125 MG tablet Take 1 tablet (3.125 mg total) by mouth 2 (two) times daily. 12/14/14   Rogelia Mire, NP    Review of Systems  Anxiety as above.  She denies chest pain, palpitations, dyspnea, pnd, orthopnea, n, v, dizziness, syncope, edema, weight gain, or early satiety.  All other systems reviewed and are otherwise negative except as noted above.  Physical Exam  VS:  BP 118/70 mmHg  Pulse 82  Ht 5' 7.5" (1.715 m)  Wt 143 lb 12 oz (65.205 kg)  BMI 22.17 kg/m2 , BMI Body mass index is 22.17 kg/(m^2). GEN: Well nourished, well developed, in no acute distress. HEENT: normal. Neck: Supple, no JVD, carotid bruits, or masses. Cardiac: RRR, no murmurs, rubs, or gallops. No clubbing, cyanosis,  edema.  Radials/DP/PT 2+ and equal bilaterally.  Respiratory:  Respirations regular and unlabored, clear to auscultation bilaterally. GI: Soft, nontender, nondistended, BS + x 4. MS: no deformity or atrophy. Skin: warm and dry, no rash. Neuro:  Strength and sensation are intact. Psych: Normal affect.  Accessory Clinical Findings  ECG - RSR, 82, no acute st/t changes.  Assessment & Plan  1.  Labile HTN:  It appears that since changing her doxazosin to 4 mg  bid, she has had overall better bp control.  She is also on amlodipine 10 mg and hctz 25 mg daily.  She would like to come off of hctz 2/2 polyuria.  I reviewed her med list and HR/BP trends.  She says that her HR is often in the 80's @ home.  She does have prn propranolol 2/2 h/o palpitations, but she hasn't had to use this since last August.  I think we will get more effective BP lowering from carvedilol and thus I've added 3.125 mg BID today.  I've asked her to check her bp no more than once or twice a day and to call us if systolics remain greater than 170 mmHg after she has taken her meds.  If BP is up off of HCTZ, we could likely titrate her coreg further so long as her HR is stable.  Cont amlodipine and doxazosin @ current doses.  She is interested in participating in yoga as a form of stress relief and I think that this will be good for her.  2.  Palpitations:  She has not had any since August.  She has prn propranolol but since we are adding coreg, hopefully she will not need it.  3.  Dispo:  F/U Dr. Rockey Situ as scheduled.    Murray Hodgkins, NP 12/14/2014, 4:39 PM

## 2014-12-14 NOTE — Progress Notes (Signed)
1.) Reason for visit: BP check  2.) Name of MD requesting visit: Dr. Rockey Situ  3.) H&P: Pt presents to office c/o continued high BP.  Pt reports stressors at home, most recently the addition of a new kitten that her other cats do not get along with.  Please see phone notes, as pt calls frequently w/ BP readings at home.   4.) ROS related to problem: Pt states that she feels that her BP is so high today that she may need to be admitted.  She requests that her HCTZ be switched to something else, as she feels uncomfortable w/ urinary frequency and some new onset incontinence that she says is affecting her self esteem.   She is interested in participating in a yoga program to try to alleviate some stress and reduce her BP, but she is concerned about possible bladder leakage.    5.) Assessment and plan per MD: Pt's BP today is 148/88 in the left arm.  Pt reports that this is a low reading for her.  Pt would like to discuss med change today.  She is sched to see Ignacia Bayley, NP today at 1:15 to discuss.

## 2014-12-14 NOTE — Patient Instructions (Addendum)
Please stop HCTZ Please start Coreg 3.125 mg twice daily  Please call if systolic blood pressure (top number) is greater than 180  Call or return to clinic prn if these symptoms worsen or fail to improve as anticipated.

## 2014-12-14 NOTE — Telephone Encounter (Signed)
Pt added to schedule for nurse visit

## 2014-12-20 ENCOUNTER — Telehealth: Payer: Self-pay | Admitting: *Deleted

## 2014-12-20 DIAGNOSIS — I10 Essential (primary) hypertension: Secondary | ICD-10-CM

## 2014-12-20 MED ORDER — AMLODIPINE BESYLATE 10 MG PO TABS: 10.0000 mg | ORAL_TABLET | Freq: Every day | ORAL | Status: DC

## 2014-12-20 MED ORDER — DOXAZOSIN MESYLATE 8 MG PO TABS: 8.0000 mg | ORAL_TABLET | Freq: Every day | ORAL | Status: DC

## 2014-12-20 NOTE — Telephone Encounter (Signed)
New Rx sent for cardura 8 mg take one tablet daily and amlodipine.

## 2014-12-20 NOTE — Telephone Encounter (Signed)
New rx called in for taking 4mg  twice a day to 8mg  once a day. Please call patient.

## 2014-12-24 ENCOUNTER — Telehealth: Payer: Self-pay

## 2014-12-24 MED ORDER — NEBIVOLOL HCL 5 MG PO TABS: 5.0000 mg | ORAL_TABLET | Freq: Every day | ORAL | Status: DC

## 2014-12-24 NOTE — Telephone Encounter (Signed)
On Monday could hold the carvedilol Start bystolic 5 mg for several days Check blood pressure If it continues to run high, will increase up to 10 mg daily Continue amlodipine and Cardura She can take Cardura 4 mg twice a day

## 2014-12-24 NOTE — Telephone Encounter (Signed)
Spoke w/ pt.  Advised her of Dr. Gollan's recommendation.  She verbalizes understanding and will call back w/ any further questions or concerns.  

## 2014-12-24 NOTE — Telephone Encounter (Signed)
Pt called with BP readings: 2/18-left 165/95, right 166/98 2-19 -left 151/90, right 134/86 Pt has a question regarding her medicine, Doxazosin. Please call Also, Carvedilol is making her ears "buzz"

## 2014-12-27 ENCOUNTER — Telehealth: Payer: Self-pay | Admitting: *Deleted

## 2014-12-27 NOTE — Telephone Encounter (Signed)
Pt is calling to give [place] a heads up.  Next time when pt  Nebivolol will be the one we need to send.  She will call us when needs a refill [address].  Just calling to let us know.   Pt is also giving [place] the new reading of BP for they are looking really good 12/26/14 7am left arm 150/87 and right arm 139/86                 11pm left arm 136/81 right arm 129/76  This morning 6am  Left arm139/79 right 122/81

## 2014-12-29 ENCOUNTER — Ambulatory Visit (INDEPENDENT_AMBULATORY_CARE_PROVIDER_SITE_OTHER): Payer: Medicare Other | Admitting: Internal Medicine

## 2014-12-29 ENCOUNTER — Encounter (INDEPENDENT_AMBULATORY_CARE_PROVIDER_SITE_OTHER): Payer: Self-pay

## 2014-12-29 ENCOUNTER — Encounter: Payer: Self-pay | Admitting: Internal Medicine

## 2014-12-29 VITALS — BP 106/60 | HR 54 | Temp 98.6°F | Resp 14 | Ht 67.0 in | Wt 142.0 lb

## 2014-12-29 DIAGNOSIS — Z720 Tobacco use: Secondary | ICD-10-CM

## 2014-12-29 DIAGNOSIS — Z87891 Personal history of nicotine dependence: Secondary | ICD-10-CM

## 2014-12-29 DIAGNOSIS — I10 Essential (primary) hypertension: Secondary | ICD-10-CM | POA: Diagnosis not present

## 2014-12-29 DIAGNOSIS — T781XXS Other adverse food reactions, not elsewhere classified, sequela: Secondary | ICD-10-CM | POA: Diagnosis not present

## 2014-12-29 DIAGNOSIS — Z1239 Encounter for other screening for malignant neoplasm of breast: Secondary | ICD-10-CM

## 2014-12-29 DIAGNOSIS — R000 Tachycardia, unspecified: Secondary | ICD-10-CM

## 2014-12-29 MED ORDER — TETANUS-DIPHTH-ACELL PERTUSSIS 5-2.5-18.5 LF-MCG/0.5 IM SUSP
0.5000 mL | Freq: Once | INTRAMUSCULAR | Status: DC
Start: 2014-12-29 — End: 2020-09-09

## 2014-12-29 NOTE — Progress Notes (Signed)
Pre-visit discussion using our clinic review tool. No additional management support is needed unless otherwise documented below in the visit note.  

## 2014-12-29 NOTE — Assessment & Plan Note (Signed)
Stopped in 2001  For cat

## 2014-12-29 NOTE — Patient Instructions (Addendum)
Your blood pressure is well controlled currenlty  Please have Dr Thurmond Butts send me your most recent labs  Mammogram ordered; we will call you with appt  Please return in 6 months for medicare wellness exam   Your still need your tetanus-diptheria-pertussis vaccine (TDaP) but you can get it for less $$$ at a local pharmacy with the script I have provided you.

## 2014-12-29 NOTE — Progress Notes (Addendum)
Patient ID: Amy Mcconnell, female   DOB: 12/04/1950, 64 y.o.   MRN: [id number]  Patient Active Problem List   Diagnosis Date Noted  . Anxiety   . Essential hypertension 10/06/2014  . Left foot pain 08/20/2014  . Avulsion fracture of calcaneus 08/20/2014  . Chest pain 07/23/2014  . 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 Tachycardia 01/22/2014  . Smoking history 01/22/2014  . Hyperlipidemia 01/22/2014    Subjective:  CC:   Chief Complaint  Patient presents with  . Follow-up    Bloodp pressure and medication    HPI:   Amy Mcconnell is a 64 y.o. female who presents for Follow up on hypertension management and other chronic issues. .  She is a former patient of Amy Mcconnell and was last seen 6 months ago.   She is tolerating her current antihypertensive medications with minor orthostatic symptoms, which have not resulted in any falls .  The last one added was bystolic.  She does not check readings at home very often.   She has persistent dry mouth secondary to use of  First generation anthistamines notably a very high daily benadryl dose but without it she has angiodema pruritis from to perfumes , plant odors,  And other environmental irritants secondary to oral allergy syndrome, diagnosed by Dr Amy Mcconnell.   She had nonfasting labs done recently by her Amy Mcconnell, her psychiatrist due to use of high risk medications for management of bipolar disorder,   But has not brought them with her today.    Past Medical History  Diagnosis Date  . Other specified disorders of thyroid   . Essential hypertension, benign   . Contusion of unspecified part of lower limb   . Other and unspecified hyperlipidemia   . Contact dermatitis and other eczema due to other specified agent   . Reflux esophagitis   . Bipolar disorder     Amy Mcconnell - every 3 months  . 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 Heart murmur   . Broken foot     left   . Anxiety   . Tachycardia     a. isolated episode,  seen in ED with negative work up    Past Surgical History  Procedure Laterality Date  . Cataract extraction    . Salpingectomy Left   . Sterilization      Her decision since dz of bipolar       The following portions of the patient's history were reviewed and updated as appropriate: Allergies, current medications, and problem list.    Review of Systems:   Patient denies headache, fevers, malaise, unintentional weight loss, skin rash, eye pain, sinus congestion and sinus pain, sore throat, dysphagia,  hemoptysis , cough, dyspnea, wheezing, chest pain, palpitations, orthopnea, edema, abdominal pain, nausea, melena, diarrhea, constipation, flank pain, dysuria, hematuria, urinary  Frequency, nocturia, numbness, tingling, seizures,  Focal weakness, Loss of consciousness,  Tremor, insomnia, depression, anxiety, and suicidal ideation.     History   Social History  . Marital Status: Divorced    Spouse Name: N/A  . Number of Children: 0  . Years of Education: 14   Occupational History  . Not on file.   Social History Main Topics  . Smoking status: Former Smoker -- 0.50 packs/day for 13 years    Quit date: 01/23/2000  . Smokeless tobacco: Never Used  . Alcohol Use: No  . Drug Use: No  . Sexual Activity: Not on file  Other Topics Concern  . Not on file   Social History Narrative   Amy Mcconnell grew up in [place], [place]. She lives in [place]. Amy Mcconnell is divorced. She has 2 cats (Charlie and Angie). 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 She also volunteers 2 days at [employer] [facility] [employer] [facility] [employer]. She enjoys reading, garding and cooking.    Objective:  Filed Vitals:   12/29/14 1514  BP: 106/60  Pulse: 54  Temp: 98.6 F (37 C)  Resp: 14     General appearance: alert, cooperative and appears stated age Ears: normal TM's and external ear canals both ears Throat: lips, mucosa, and tongue normal; teeth and gums normal Neck: no adenopathy, no carotid bruit, supple,  symmetrical, trachea midline and thyroid not enlarged, symmetric, no tenderness/mass/nodules Back: symmetric, no curvature. ROM normal. No CVA tenderness. Lungs: clear to auscultation bilaterally Heart: regular rate and rhythm, S1, S2 normal, no murmur, click, rub or gallop Abdomen: soft, non-tender; bowel sounds normal; no masses,  no organomegaly Pulses: 2+ and symmetric Skin: Skin color, texture, turgor normal. No rashes or lesions Lymph nodes: Cervical, supraclavicular, and axillary nodes normal.  Assessment and Plan:  Smoking history Stopped in 2001  For cat    Oral allergy syndrome Managed with antihistamines and avoidance.    Tachycardia No recurrence.  Now on bystolic for management of hypertension and pulse is < 60. Prn propranolol.    Essential hypertension His BP is  at goal currently.  Medications reviewed and compliance assessed via patient report.  Renal function and electolytes assessed.  Use of NSAIDs, oral decongestants and other medications/supplements reviewed    Lab Results  Component Value Date   NA 143 11/23/2014   K 4.1 11/23/2014   CL 107 03/26/2014   CO2 27 03/26/2014   Lab Results  Component Value Date   CREATININE 0.9 11/23/2014    the following changes were made to her regimen: none   A total of 25 minutes of face to face time was spent with patient more than half of which was spent in counselling on the above mentioned issues.  Updated Medication List Outpatient Encounter Prescriptions as of 12/29/2014  Medication Sig  . amLODipine (NORVASC) 10 MG tablet Take 1 tablet (10 mg total) by mouth daily.  . Ascorbic Acid (VITAMIN C) 1000 MG tablet Take 1,000 mg by mouth daily.  Marland Kitchen aspirin 81 MG tablet Take 81 mg by mouth daily.  Marland Kitchen atorvastatin (LIPITOR) 40 MG tablet Take 1 tablet (40 mg total) by mouth daily.  . B Complex Vitamins (VITAMIN B COMPLEX PO) Take by mouth daily.  . calcium carbonate (OS-CAL) 600 MG TABS tablet Take 600 mg by mouth 2  (two) times daily with a meal.  . Cholecalciferol (VITAMIN D PO) Take 1 tablet by mouth every morning.   . diphenhydrAMINE (BENADRYL) 25 mg capsule Take 25 mg by mouth daily as needed for allergies.  Marland Kitchen doxazosin (CARDURA) 8 MG tablet Take 1 tablet (8 mg total) by mouth daily.  . fexofenadine (ALLEGRA) 180 MG tablet Take 180 mg by mouth 2 (two) times daily.   Marland Kitchen levothyroxine (SYNTHROID, LEVOTHROID) 125 MCG tablet Take 125 mcg by mouth daily before breakfast.  . lithium carbonate (ESKALITH) 450 MG CR tablet Take by mouth every evening.   . magnesium oxide (MAG-OX) 400 MG tablet Take 400 mg by mouth daily.  . Multiple Vitamins-Minerals (MULTIVITAMIN WITH MINERALS) tablet Take 1 tablet by mouth daily.  . Multiple Vitamins-Minerals (VISION FORMULA/LUTEIN) TABS Take by  mouth.  . naproxen sodium (ALEVE) 220 MG tablet Take 440 mg by mouth every 8 (eight) hours.  . nebivolol (BYSTOLIC) 5 MG tablet Take 1 tablet (5 mg total) by mouth daily.  . Omega-3 Fatty Acids (FISH OIL) 1000 MG CAPS Take by mouth daily.  . Oxcarbazepine (TRILEPTAL) 300 MG tablet Take 300 mg one tablet in the am & two tablets in the pm.  . vitamin B-12 (CYANOCOBALAMIN) 1000 MCG tablet Take 1,000 mcg by mouth daily.  . vitamin E 1000 UNIT capsule Take 1,000 Units by mouth daily.  Marland Kitchen zinc gluconate 50 MG tablet Take 50 mg by mouth daily.  . ziprasidone (GEODON) 40 MG capsule Take 40 mg by mouth daily.  . ziprasidone (GEODON) 60 MG capsule Take 60 mg by mouth daily.  . propranolol (INDERAL) 20 MG tablet Take 1 tablet (20 mg total) by mouth 3 (three) times daily as needed (for tachycardia). (Patient not taking: Reported on 12/29/2014)  . Tdap (BOOSTRIX) 5-2.5-18.5 LF-MCG/0.5 injection Inject 0.5 mLs into the muscle once.     Orders Placed This Encounter  Procedures  . HM MAMMOGRAPHY  . MM DIGITAL SCREENING BILATERAL  . HM PAP SMEAR    No Follow-up on file.

## 2014-12-31 ENCOUNTER — Encounter: Payer: Self-pay | Admitting: Nurse Practitioner

## 2015-01-01 ENCOUNTER — Encounter: Payer: Self-pay | Admitting: Internal Medicine

## 2015-01-01 NOTE — Addendum Note (Signed)
Addended by: Crecencio Mc on: 01/01/2015 10:17 AM   Modules accepted: Level of Service

## 2015-01-01 NOTE — Assessment & Plan Note (Addendum)
His BP is  at goal currently.  Medications reviewed and compliance assessed via patient report.  Renal function and electolytes assessed.  Use of NSAIDs, oral decongestants and other medications/supplements reviewed    Lab Results  Component Value Date   NA 143 11/23/2014   K 4.1 11/23/2014   CL 107 03/26/2014   CO2 27 03/26/2014   Lab Results  Component Value Date   CREATININE 0.9 11/23/2014    the following changes were made to her regimen: none

## 2015-01-01 NOTE — Assessment & Plan Note (Signed)
No recurrence.  Now on bystolic for management of hypertension and pulse is < 60. Prn propranolol.

## 2015-01-01 NOTE — Assessment & Plan Note (Signed)
Managed with antihistamines and avoidance.

## 2015-01-14 ENCOUNTER — Telehealth: Payer: Self-pay | Admitting: *Deleted

## 2015-01-14 NOTE — Telephone Encounter (Signed)
Pt calling stating that the medication bystolic is doing well.  And well is calling asking is this the generic brand or not For now the copay is 75 She would like to know  If it is not, she would like [place] to call in a generic version.  If it is then just call patient and she would like to know.

## 2015-01-28 ENCOUNTER — Telehealth: Payer: Self-pay | Admitting: Cardiovascular Disease

## 2015-01-28 ENCOUNTER — Emergency Department: Payer: Self-pay | Admitting: Internal Medicine

## 2015-01-28 DIAGNOSIS — R32 Unspecified urinary incontinence: Secondary | ICD-10-CM | POA: Diagnosis not present

## 2015-01-28 LAB — URINALYSIS, COMPLETE
BILIRUBIN, UR: NEGATIVE
Bacteria: NONE SEEN
Blood: NEGATIVE
Glucose,UR: NEGATIVE mg/dL (ref 0–75)
KETONE: NEGATIVE
NITRITE: NEGATIVE
PROTEIN: NEGATIVE
Ph: 6 (ref 4.5–8.0)
RBC,UR: <1 /HPF (ref 0–5)
Specific Gravity: 1.006 (ref 1.003–1.030)
Squamous Epithelial: <1
WBC UR: 1 /HPF (ref 0–5)

## 2015-01-28 NOTE — Telephone Encounter (Signed)
Noted  

## 2015-01-28 NOTE — Telephone Encounter (Signed)
That is not a known adverse effect of Bystolic. That sounds concerning for cauda equina syndrome. She needs to go have an have this evaluated ASAP with imaging. Today if possible, even if that means going to the ER.

## 2015-01-28 NOTE — Telephone Encounter (Signed)
Patient is having reaction to Bystolic.  Per patient sh has loss of bowel and bladder since starting meds for 2 months.  Please call Patient with Dr. Donivan Scull recommendations.

## 2015-01-28 NOTE — Telephone Encounter (Signed)
Pt currently at [facility] ED.

## 2015-01-28 NOTE — Telephone Encounter (Signed)
Spoke w/ pt.  Advised her of Ryan's recommendation.  She verbalizes understanding, though she states that she "will research it and I may just go tomorrow".  Stressed the importance of immediate attention, but she states that she has too much to do.

## 2015-02-03 ENCOUNTER — Telehealth: Payer: Self-pay | Admitting: *Deleted

## 2015-02-03 NOTE — Telephone Encounter (Signed)
Left message on machine for pt to contact the office.   

## 2015-02-03 NOTE — Telephone Encounter (Signed)
S/w pt is having major bladder leakage where pt is wearing pads.  Pt stated it is due to bystolic. Wanted to know when hctz and bystolic started and ended. Discussed with pt. And gave pt dates.  Pt does have a f/u with Dr. Rockey Situ and will disucuss further at office visit.

## 2015-02-03 NOTE — Telephone Encounter (Signed)
Pt is going to see another doctor and is having problems with medications.  She would like to know when she started and when she ended it [facility] and Bystolic.    Please advise.

## 2015-02-07 DIAGNOSIS — R928 Other abnormal and inconclusive findings on diagnostic imaging of breast: Secondary | ICD-10-CM | POA: Diagnosis not present

## 2015-02-07 DIAGNOSIS — Z1231 Encounter for screening mammogram for malignant neoplasm of breast: Secondary | ICD-10-CM | POA: Diagnosis not present

## 2015-02-11 DIAGNOSIS — R928 Other abnormal and inconclusive findings on diagnostic imaging of breast: Secondary | ICD-10-CM | POA: Diagnosis not present

## 2015-02-14 ENCOUNTER — Encounter: Payer: Self-pay | Admitting: Cardiovascular Disease

## 2015-02-14 ENCOUNTER — Ambulatory Visit (INDEPENDENT_AMBULATORY_CARE_PROVIDER_SITE_OTHER): Payer: Medicare Other | Admitting: Cardiovascular Disease

## 2015-02-14 VITALS — BP 100/68 | HR 60 | Ht 67.5 in | Wt 140.5 lb

## 2015-02-14 DIAGNOSIS — I10 Essential (primary) hypertension: Secondary | ICD-10-CM

## 2015-02-14 DIAGNOSIS — Z720 Tobacco use: Secondary | ICD-10-CM

## 2015-02-14 DIAGNOSIS — R000 Tachycardia, unspecified: Secondary | ICD-10-CM

## 2015-02-14 DIAGNOSIS — R0789 Other chest pain: Secondary | ICD-10-CM | POA: Diagnosis not present

## 2015-02-14 DIAGNOSIS — E785 Hyperlipidemia, unspecified: Secondary | ICD-10-CM

## 2015-02-14 DIAGNOSIS — Z87891 Personal history of nicotine dependence: Secondary | ICD-10-CM

## 2015-02-14 MED ORDER — PROPRANOLOL HCL ER 60 MG PO CP24: 60.0000 mg | ORAL_CAPSULE | Freq: Every day | ORAL | Status: DC

## 2015-02-14 NOTE — Assessment & Plan Note (Signed)
On today's visit, reports she is active with no symptoms of chest pain. No further workup at this time

## 2015-02-14 NOTE — Assessment & Plan Note (Signed)
Recommended that she stay on her Lipitor 40 mg daily Lab recheck with primary care
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
## 2015-02-14 NOTE — Patient Instructions (Addendum)
You are doing well.  Norvasc in the Am cardura in the PM Please monitor your blood pressure  If needed, propranolol ER at dinner  Please call us if you have new issues that need to be addressed before your next appt.  Your physician wants you to follow-up in: 6 months.  You will receive a reminder letter in the mail two months in advance. If you don't receive a letter, please call our office to schedule the follow-up appointment.

## 2015-02-14 NOTE — Assessment & Plan Note (Signed)
She denies having any recent episodes of tachycardia. We'll hold the bystolic. Propranolol 20 or extended release 60 mg could be used for tachycardia

## 2015-02-14 NOTE — Assessment & Plan Note (Signed)
There recommended that she stop the bystolic. Unclear if this is contributing to her polyuria. Recommended that she monitor her blood pressure without medication over the next week. If blood pressure trends higher, she could take propranolol ER 60 mg daily. Alternatively, she could take Cardura twice a day (start additional 4 mg in the morning, 8 mg in the evening)

## 2015-02-14 NOTE — Assessment & Plan Note (Signed)
encouraged continued smoking cessation. She does not want any assistance with chantix.

## 2015-02-23 DIAGNOSIS — F3170 Bipolar disorder, currently in remission, most recent episode unspecified: Secondary | ICD-10-CM | POA: Diagnosis not present

## 2015-03-21 ENCOUNTER — Other Ambulatory Visit (INDEPENDENT_AMBULATORY_CARE_PROVIDER_SITE_OTHER): Payer: Medicare Other

## 2015-03-21 ENCOUNTER — Telehealth: Payer: Self-pay | Admitting: *Deleted

## 2015-03-21 DIAGNOSIS — E785 Hyperlipidemia, unspecified: Secondary | ICD-10-CM | POA: Diagnosis not present

## 2015-03-21 DIAGNOSIS — E559 Vitamin D deficiency, unspecified: Secondary | ICD-10-CM | POA: Diagnosis not present

## 2015-03-21 DIAGNOSIS — I10 Essential (primary) hypertension: Secondary | ICD-10-CM | POA: Diagnosis not present

## 2015-03-21 LAB — COMPREHENSIVE METABOLIC PANEL
ALK PHOS: 80 U/L (ref 39–117)
ALT: 34 U/L (ref 0–35)
AST: 27 U/L (ref 0–37)
Albumin: 4.5 g/dL (ref 3.5–5.2)
BILIRUBIN TOTAL: 0.4 mg/dL (ref 0.2–1.2)
BUN: 21 mg/dL (ref 6–23)
CALCIUM: 10 mg/dL (ref 8.4–10.5)
CO2: 30 mEq/L (ref 19–32)
Chloride: 105 mEq/L (ref 96–112)
Creatinine, Ser: 0.93 mg/dL (ref 0.40–1.20)
GFR: 64.43 mL/min (ref >60.00)
Glucose, Bld: 100 mg/dL — ABNORMAL HIGH (ref 70–99)
Potassium: 4.6 mEq/L (ref 3.5–5.1)
Sodium: 138 mEq/L (ref 135–145)
Total Protein: 7.3 g/dL (ref 6.0–8.3)

## 2015-03-21 LAB — LIPID PANEL
Cholesterol: 213 mg/dL — ABNORMAL HIGH (ref 0–200)
HDL: 96.9 mg/dL (ref >39.00)
LDL Cholesterol: 103 mg/dL — ABNORMAL HIGH (ref 0–99)
NonHDL: 116.1
Total CHOL/HDL Ratio: 2
Triglycerides: 66 mg/dL (ref 0.0–149.0)
VLDL: 13.2 mg/dL (ref 0.0–40.0)

## 2015-03-21 LAB — VITAMIN D 25 HYDROXY (VIT D DEFICIENCY, FRACTURES): VITD: 35.79 ng/mL (ref 30.00–100.00)

## 2015-03-21 LAB — TSH: TSH: 1.59 u[IU]/mL (ref 0.35–4.50)

## 2015-03-21 NOTE — Telephone Encounter (Signed)
Labs and dx?  

## 2015-03-21 NOTE — Telephone Encounter (Signed)
Is she seeing someone else. I have nothing to order since I never met her.

## 2015-03-21 NOTE — Telephone Encounter (Signed)
Never met her. I have no idea. Did she come in today? She was an ex- [facility] pt.

## 2015-03-21 NOTE — Telephone Encounter (Signed)
She came in today

## 2015-03-25 ENCOUNTER — Encounter: Payer: Self-pay | Admitting: *Deleted

## 2015-03-30 ENCOUNTER — Ambulatory Visit (INDEPENDENT_AMBULATORY_CARE_PROVIDER_SITE_OTHER): Payer: Medicare Other | Admitting: Nurse Practitioner

## 2015-03-30 ENCOUNTER — Encounter: Payer: Self-pay | Admitting: Nurse Practitioner

## 2015-03-30 VITALS — BP 128/72 | HR 57 | Resp 14 | Ht 67.5 in | Wt 139.2 lb

## 2015-03-30 DIAGNOSIS — F3170 Bipolar disorder, currently in remission, most recent episode unspecified: Secondary | ICD-10-CM

## 2015-03-30 DIAGNOSIS — E785 Hyperlipidemia, unspecified: Secondary | ICD-10-CM

## 2015-03-30 DIAGNOSIS — F319 Bipolar disorder, unspecified: Secondary | ICD-10-CM | POA: Insufficient documentation

## 2015-03-30 MED ORDER — ATORVASTATIN CALCIUM 40 MG PO TABS: 40.0000 mg | ORAL_TABLET | Freq: Every day | ORAL | Status: DC

## 2015-03-30 NOTE — Progress Notes (Signed)
Pre visit review using our clinic review tool, if applicable. No additional management support is needed unless otherwise documented below in the visit note. 

## 2015-03-30 NOTE — Progress Notes (Signed)
   Subjective:    Patient ID: Amy Mcconnell, female    DOB: 04-Aug-1951, 64 y.o.   MRN: [id number]  HPI  Amy Mcconnell 6 month follow up. She was a [facility] patient.   1) Doing well with BP, Sees Dr. Rockey Situ  2) Takes benadryl daily- multiple pills (3-4 tablets at a time)   Zyrtec doesn't work, Human resources officer daily  3) Reports a Dr. James Ivanoff would like a copy of her labs  Review of Systems  Constitutional: Negative for fever, chills, diaphoresis and fatigue.  Respiratory: Negative for chest tightness, shortness of breath and wheezing.   Cardiovascular: Negative for chest pain, palpitations and leg swelling.  Gastrointestinal: Negative for nausea, vomiting and diarrhea.  Skin: Negative for rash.  Allergic/Immunologic: Positive for environmental allergies and food allergies.  Neurological: Negative for dizziness, weakness, numbness and headaches.  Psychiatric/Behavioral: The patient is not nervous/anxious.        Objective:   Physical Exam  Constitutional: She is oriented to person, place, and time. She appears well-developed and well-nourished. No distress.  BP 128/72 mmHg  Pulse 57  Resp 14  Ht 5' 7.5" (1.715 m)  Wt 139 lb 4 oz (63.163 kg)  BMI 21.48 kg/m2  SpO2 96%   HENT:  Head: Normocephalic and atraumatic.  Right Ear: External ear normal.  Left Ear: External ear normal.  Cardiovascular: Normal rate, regular rhythm and normal heart sounds.  Exam reveals no gallop and no friction rub.   No murmur heard. Pulmonary/Chest: Effort normal and breath sounds normal. No respiratory distress. She has no wheezes. She has no rales. She exhibits no tenderness.  Neurological: She is alert and oriented to person, place, and time. No cranial nerve deficit. She exhibits normal muscle tone. Coordination normal.  Skin: Skin is warm and dry. No rash noted. She is not diaphoretic.  Psychiatric: She has a normal mood and affect. Her behavior is normal. Judgment and thought content normal.          Assessment & Plan:

## 2015-03-30 NOTE — Patient Instructions (Addendum)
See you when you need me or in 1 year.

## 2015-03-31 ENCOUNTER — Telehealth: Payer: Self-pay | Admitting: Nurse Practitioner

## 2015-03-31 NOTE — Telephone Encounter (Signed)
Pt called stating she missed a call and what the call was about. 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

## 2015-04-07 ENCOUNTER — Encounter: Payer: Self-pay | Admitting: Nurse Practitioner

## 2015-04-07 ENCOUNTER — Ambulatory Visit (INDEPENDENT_AMBULATORY_CARE_PROVIDER_SITE_OTHER): Payer: Medicare Other | Admitting: Nurse Practitioner

## 2015-04-07 VITALS — BP 108/62 | HR 60 | Temp 98.0°F | Resp 12 | Ht 67.5 in | Wt 139.8 lb

## 2015-04-07 DIAGNOSIS — R109 Unspecified abdominal pain: Secondary | ICD-10-CM

## 2015-04-07 LAB — POCT URINALYSIS DIPSTICK
Bilirubin, UA: NEGATIVE
Blood, UA: NEGATIVE
Glucose, UA: NEGATIVE
KETONES UA: NEGATIVE
Nitrite, UA: NEGATIVE
PROTEIN UA: NEGATIVE
Spec Grav, UA: 1.01
UROBILINOGEN UA: 0.2
pH, UA: 6

## 2015-04-07 MED ORDER — METHOCARBAMOL 500 MG PO TABS: 500.0000 mg | ORAL_TABLET | Freq: Four times a day (QID) | ORAL | Status: DC

## 2015-04-07 NOTE — Progress Notes (Signed)
Pre visit review using our clinic review tool, if applicable. No additional management support is needed unless otherwise documented below in the visit note. 

## 2015-04-07 NOTE — Patient Instructions (Signed)
Robaxin is safest with your medications.   Take at night to see how it will affect you.   Can take up to 4 x a day (short half life 1-2 hrs)   We will contact you with culture results.

## 2015-04-07 NOTE — Progress Notes (Signed)
   Subjective:    Patient ID: Amy Mcconnell, female    DOB: [birth year], 64 y.o.   MRN: [id number]  HPI  Amy Mcconnell is a 64 yo female with left flank pain x 9 days.   1) Spasms on left side, feels muscular, intermittent since Thursday and extra strength tylenol is helpful   Review of Systems  Constitutional: Negative for fever, chills, diaphoresis and fatigue.  Eyes: Negative for visual disturbance.  Gastrointestinal: Negative for nausea, vomiting, abdominal pain, diarrhea and abdominal distention.  Genitourinary: Positive for flank pain. Negative for dysuria, urgency, frequency, hematuria, enuresis and difficulty urinating.  Musculoskeletal: Positive for back pain. Negative for neck pain.  Skin: Negative for rash.      Objective:   Physical Exam  Constitutional: She is oriented to person, place, and time. She appears well-developed and well-nourished. No distress.  BP 108/62 mmHg  Pulse 60  Temp(Src) 98 F (36.7 C) (Oral)  Resp 12  Ht 5' 7.5" (1.715 m)  Wt 139 lb 12.8 oz (63.413 kg)  BMI 21.56 kg/m2  SpO2 97%   HENT:  Head: Normocephalic and atraumatic.  Right Ear: External ear normal.  Left Ear: External ear normal.  Eyes: EOM are normal. Pupils are equal, round, and reactive to light. Right eye exhibits no discharge. Left eye exhibits no discharge. No scleral icterus.  Cardiovascular: Normal rate, regular rhythm and normal heart sounds.  Exam reveals no gallop and no friction rub.   No murmur heard. Pulmonary/Chest: Effort normal and breath sounds normal. No respiratory distress. She has no wheezes. She has no rales. She exhibits no tenderness.  Neurological: She is alert and oriented to person, place, and time. No cranial nerve deficit. She exhibits normal muscle tone. Coordination normal.  Iliopsoas 5/5 Bilateral, Tib anterior 5/5 bilateral, EHL 5/5 bilateral, no ankle clonus, intact heel/toe/sequential walking, sensation intact upper and lower extremities. Straight  leg raise negative bilaterally.   Skin: Skin is warm and dry. No rash noted. She is not diaphoretic.  Psychiatric: She has a normal mood and affect. Her behavior is normal. Judgment and thought content normal.      Assessment & Plan:

## 2015-04-08 ENCOUNTER — Emergency Department
Admission: EM | Admit: 2015-04-08 | Discharge: 2015-04-08 | Disposition: A | Discharge status: Home / Self Care | Payer: Medicare Other | Attending: Emergency Medicine | Admitting: Emergency Medicine

## 2015-04-08 DIAGNOSIS — Z87891 Personal history of nicotine dependence: Secondary | ICD-10-CM | POA: Diagnosis not present

## 2015-04-08 DIAGNOSIS — Y9289 Other specified places as the place of occurrence of the external cause: Secondary | ICD-10-CM | POA: Diagnosis not present

## 2015-04-08 DIAGNOSIS — Z79899 Other long term (current) drug therapy: Secondary | ICD-10-CM | POA: Diagnosis not present

## 2015-04-08 DIAGNOSIS — I10 Essential (primary) hypertension: Secondary | ICD-10-CM | POA: Diagnosis not present

## 2015-04-08 DIAGNOSIS — Y9389 Activity, other specified: Secondary | ICD-10-CM | POA: Diagnosis not present

## 2015-04-08 DIAGNOSIS — S61215A Laceration without foreign body of left ring finger without damage to nail, initial encounter: Secondary | ICD-10-CM | POA: Insufficient documentation

## 2015-04-08 DIAGNOSIS — S61219A Laceration without foreign body of unspecified finger without damage to nail, initial encounter: Secondary | ICD-10-CM

## 2015-04-08 DIAGNOSIS — Z7982 Long term (current) use of aspirin: Secondary | ICD-10-CM | POA: Diagnosis not present

## 2015-04-08 DIAGNOSIS — Y998 Other external cause status: Secondary | ICD-10-CM | POA: Insufficient documentation

## 2015-04-08 DIAGNOSIS — W293XXA Contact with powered garden and outdoor hand tools and machinery, initial encounter: Secondary | ICD-10-CM | POA: Diagnosis not present

## 2015-04-08 MED ORDER — BACITRACIN ZINC 500 UNIT/GM EX OINT
TOPICAL_OINTMENT | CUTANEOUS | Status: AC
  Filled 2015-04-08: qty 0.9

## 2015-04-08 MED ORDER — BACITRACIN 500 UNIT/GM EX OINT
1.0000 "application " | TOPICAL_OINTMENT | Freq: Two times a day (BID) | CUTANEOUS | Status: DC
  Filled 2015-04-08: qty 0.9

## 2015-04-08 MED ORDER — LIDOCAINE HCL (PF) 1 % IJ SOLN
INTRAMUSCULAR | Status: AC
  Filled 2015-04-08: qty 5

## 2015-04-08 NOTE — ED Notes (Signed)
Pt states she cut her left ring/4th finger with manual trimmers.Amy Mcconnell

## 2015-04-08 NOTE — ED Provider Notes (Signed)
CSN: [id number]     Arrival date & time 04/08/15  1345 History   First MD Initiated Contact with Patient 04/08/15 1522     Chief Complaint  Patient presents with  . Laceration     (Consider location/radiation/quality/duration/timing/severity/associated sxs/prior Treatment) HPI Patient arrives today with a laceration to her left ring finger states she was using brand-new manual tremors states that they were sharp she caught the tip of her finger and it tried to control with manual pressure bleeding would not stop she is here today for wound closure rates her pain as approximately a 6 out of 10 worse with any type of touch or movement to the finger holding it still seems to make it better patient denies any other complaints at this time Past Medical History  Diagnosis Date  . Other specified disorders of thyroid   . Essential hypertension, benign   . Contusion of unspecified part of lower limb   . Other and unspecified hyperlipidemia   . Contact dermatitis and other eczema due to other specified agent   . Reflux esophagitis   . Bipolar disorder     Dr. Thurmond Butts - every 3 months  . 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 Heart murmur   . Broken foot     left   . Anxiety   . Tachycardia     a. isolated episode, seen in ED with negative work up   Past Surgical History  Procedure Laterality Date  . Cataract extraction    . Salpingectomy Left   . Sterilization      Her decision since dz of bipolar   Family History  Problem Relation Age of Onset  . Arrhythmia Mother     A-Fib  . Heart failure Mother   . Hyperlipidemia Mother   . Hypertension Mother   . Hypertension Father   . Hyperlipidemia Father   . Mental retardation Father 5    Suicide  . Hypertension Brother   . Hyperlipidemia Brother   . Hypertension Brother   . Hyperlipidemia Brother   . Hyperlipidemia Brother   . Hypertension Brother   . Alcohol abuse Brother   . Depression Brother   . Hyperlipidemia  Brother   . Hypertension Brother    History  Substance Use Topics  . Smoking status: Former Smoker -- 0.50 packs/day for 13 years    Quit date: 01/23/2000  . Smokeless tobacco: Never Used  . Alcohol Use: No   OB History    No data available     Review of Systems  Constitutional: Negative.   HENT: Negative.   Eyes: Negative.   Respiratory: Negative.   Cardiovascular: Negative.   Musculoskeletal: Negative.   All other systems reviewed and are negative.     Allergies  Ace inhibitors; Angiotensin receptor blockers; Benicar; Poison ivy extract; and Triclosan  Home Medications   Prior to Admission medications   Medication Sig Start Date End Date Taking? Authorizing Provider  acetaminophen (TYLENOL) 500 MG tablet Take 1,000 mg by mouth every 6 (six) hours as needed.    Historical Provider, MD  amLODipine (NORVASC) 10 MG tablet Take 1 tablet (10 mg total) by mouth daily. 12/20/14   Minna Merritts, MD  Ascorbic Acid (VITAMIN C) 1000 MG tablet Take 1,000 mg by mouth daily.    Historical Provider, MD  aspirin 81 MG tablet Take 81 mg by mouth daily.    Historical Provider, MD  atorvastatin (LIPITOR) 40 MG tablet Take 1 tablet (  40 mg total) by mouth daily. 03/30/15   Rubbie Battiest, NP  B Complex Vitamins (VITAMIN B COMPLEX PO) Take by mouth daily.    Historical Provider, MD  calcium carbonate (OS-CAL) 600 MG TABS tablet Take 600 mg by mouth 2 (two) times daily with a meal.    Historical Provider, MD  cetirizine (ZYRTEC) 10 MG tablet Take 10 mg by mouth. Prn angioedema    Historical Provider, MD  Cholecalciferol (VITAMIN D PO) Take 1 tablet by mouth every morning.     Historical Provider, MD  diphenhydrAMINE (BENADRYL) 25 mg capsule Take 25 mg by mouth daily as needed for allergies. For food and fragrance sensitivities    Historical Provider, MD  doxazosin (CARDURA) 8 MG tablet Take 1 tablet (8 mg total) by mouth daily. 12/20/14   Rogelia Mire, NP  fexofenadine (ALLEGRA) 180 MG  tablet Take 180 mg by mouth. 2-3 times daily prn For food and fragrance sensitivities/intolerances    Historical Provider, MD  levothyroxine (SYNTHROID, LEVOTHROID) 125 MCG tablet Take 125 mcg by mouth daily before breakfast.    Historical Provider, MD  lithium carbonate (ESKALITH) 450 MG CR tablet Take by mouth every evening.     Historical Provider, MD  magnesium oxide (MAG-OX) 400 MG tablet Take 400 mg by mouth daily.    Historical Provider, MD  methocarbamol (ROBAXIN) 500 MG tablet Take 1 tablet (500 mg total) by mouth 4 (four) times daily. 04/07/15   Rubbie Battiest, NP  Multiple Vitamins-Minerals (MULTIVITAMIN WITH MINERALS) tablet Take 1 tablet by mouth daily.    Historical Provider, MD  Multiple Vitamins-Minerals (VISION FORMULA/LUTEIN) TABS Take by mouth.    Historical Provider, MD  Omega-3 Fatty Acids (FISH OIL) 1000 MG CAPS Take by mouth daily.    Historical Provider, MD  Oxcarbazepine (TRILEPTAL) 300 MG tablet Take 300 mg one tablet in the am & two tablets in the pm.    Historical Provider, MD  propranolol (INDERAL) 20 MG tablet Take 1 tablet (20 mg total) by mouth 3 (three) times daily as needed (for tachycardia). 01/22/14   Minna Merritts, MD  propranolol ER (INDERAL LA) 60 MG 24 hr capsule Take 1 capsule (60 mg total) by mouth daily. 02/14/15   Minna Merritts, MD  Tdap (BOOSTRIX) 5-2.5-18.5 LF-MCG/0.5 injection Inject 0.5 mLs into the muscle once. 12/29/14   Crecencio Mc, MD  vitamin B-12 (CYANOCOBALAMIN) 1000 MCG tablet Take 1,000 mcg by mouth daily.    Historical Provider, MD  vitamin E 1000 UNIT capsule Take 1,000 Units by mouth daily.    Historical Provider, MD  zinc gluconate 50 MG tablet Take 50 mg by mouth daily.    Historical Provider, MD  ziprasidone (GEODON) 60 MG capsule Take 60 mg by mouth daily.    Historical Provider, MD   BP 120/68 mmHg  Pulse 68  Resp 16 Physical Exam Female appearing stated age well-developed well-nourished and in no acute distress Vitals reviewed  in nursing note reviewed Head ears eyes nose neck and throat exam patient unremarkable Cardiovascular regular rate and rhythm no murmurs rubs gallops Pulmonary lungs are clear to auscultation bilaterally Musculoskeletal moving all extremities without difficulty no functional deficit Neuro exams nonfocal good distal sensation cranial nerves II through XII grossly intact Skin patient has a laceration to the left fourth digit approximately 3 cm in length and an elliptical shape around the fingertip ED Course  Procedures (including critical care time) LACERATION REPAIR Performed by: Leonor Liv  C Authorized by: Carlena Hurl Consent: Verbal consent obtained. Risks and benefits: risks, benefits and alternatives were discussed Consent given by: patient Patient identity confirmed: provided demographic data Prepped and Draped in normal sterile fashion Wound explored  Laceration Location: Left 4th finger   Laceration Length: 3 cm  No Foreign Bodies seen or palpated  Anesthesia: local infiltration digital block  Local anesthetic: lidocaine 1%   Anesthetic total: 1.5 ml  Irrigation method: syringe Amount of cleaning: standard  Skin closure: 6-0 nylon    Number of sutures: 6  Technique: Simple interrupted   Patient tolerance: Patient tolerated the procedure well with no immediate complications.  MDM  This is a patient who cut the tip of her left ring finger using pruning shears bleeding was well controlled patient tolerated the procedure well and have her return in 10 days wound was cleaned and dressed placed in a splint no other issues noted at this time Final diagnoses:  Finger laceration, initial encounter        Clary Meeker Verdene Rio, PA-C 04/08/15 1643  Harvest Dark, MD 04/08/15 2319

## 2015-04-08 NOTE — ED Notes (Signed)
Pt discharged home after verbalizing understanding of discharge instructions; nad noted. 

## 2015-04-10 LAB — URINE CULTURE: Colony Count: >=100000

## 2015-04-11 ENCOUNTER — Ambulatory Visit: Payer: BC Managed Care – PPO | Admitting: Nurse Practitioner

## 2015-04-11 ENCOUNTER — Other Ambulatory Visit: Payer: Self-pay | Admitting: Nurse Practitioner

## 2015-04-11 MED ORDER — SULFAMETHOXAZOLE-TRIMETHOPRIM 800-160 MG PO TABS: 1.0000 | ORAL_TABLET | Freq: Two times a day (BID) | ORAL | Status: DC

## 2015-04-13 ENCOUNTER — Ambulatory Visit: Payer: BC Managed Care – PPO | Admitting: Nurse Practitioner

## 2015-04-17 NOTE — Assessment & Plan Note (Signed)
Continue with Lipitor 40 mg. She will let us know when refills are needed. She is continuing Fish oil. HDL is heart protective and her LDL/Cholesterol total are not too far out of range. Will continue with medications and she is eating a healthy diet. No further concerns and will recheck in 1 yr.    Lab Results  Component Value Date   CHOL 213* 03/21/2015   HDL 96.90 03/21/2015   LDLCALC 103* 03/21/2015   TRIG 66.0 03/21/2015   CHOLHDL 2 03/21/2015

## 2015-04-17 NOTE — Assessment & Plan Note (Signed)
Seeing Dr. Lanetta Inch. No recent episodes. Patient is on Geodon, Trileptal, and Lithium

## 2015-04-18 ENCOUNTER — Emergency Department
Admission: EM | Admit: 2015-04-18 | Discharge: 2015-04-18 | Disposition: A | Discharge status: Home / Self Care | Payer: Medicare Other | Attending: Emergency Medicine | Admitting: Emergency Medicine

## 2015-04-18 DIAGNOSIS — Z79899 Other long term (current) drug therapy: Secondary | ICD-10-CM | POA: Diagnosis not present

## 2015-04-18 DIAGNOSIS — Z87891 Personal history of nicotine dependence: Secondary | ICD-10-CM | POA: Insufficient documentation

## 2015-04-18 DIAGNOSIS — I10 Essential (primary) hypertension: Secondary | ICD-10-CM | POA: Insufficient documentation

## 2015-04-18 DIAGNOSIS — Z7982 Long term (current) use of aspirin: Secondary | ICD-10-CM | POA: Diagnosis not present

## 2015-04-18 DIAGNOSIS — Z4802 Encounter for removal of sutures: Secondary | ICD-10-CM | POA: Diagnosis present

## 2015-04-18 NOTE — ED Notes (Signed)
Pt came in the ED today to get 5 sutures removed from her left ring finger. Wound looks healed . Denies pain.

## 2015-04-18 NOTE — Discharge Instructions (Signed)
Continue to keep clean. Follow up with your primary care physician as needed.   Return to ER for new or worsening concerns.   Suture Removal, Care After Refer to this sheet in the next few weeks. These instructions provide you with information on caring for yourself after your procedure. Your health care provider may also give you more specific instructions. Your treatment has been planned according to current medical practices, but problems sometimes occur. Call your health care provider if you have any problems or questions after your procedure. WHAT TO EXPECT AFTER THE PROCEDURE After your stitches (sutures) are removed, it is typical to have the following:  Some discomfort and swelling in the wound area.  Slight redness in the area. HOME CARE INSTRUCTIONS   If you have skin adhesive strips over the wound area, do not take the strips off. They will fall off on their own in a few days. If the strips remain in place after 14 days, you may remove them.  Change any bandages (dressings) at least once a day or as directed by your health care provider. If the bandage sticks, soak it off with warm, soapy water.  Apply cream or ointment only as directed by your health care provider. If using cream or ointment, wash the area with soap and water 2 times a day to remove all the cream or ointment. Rinse off the soap and pat the area dry with a clean towel.  Keep the wound area dry and clean. If the bandage becomes wet or dirty, or if it develops a bad smell, change it as soon as possible.  Continue to protect the wound from injury.  Use sunscreen when out in the sun. New scars become sunburned easily. SEEK MEDICAL CARE IF:  You have increasing redness, swelling, or pain in the wound.  You see pus coming from the wound.  You have a fever.  You notice a bad smell coming from the wound or dressing.  Your wound breaks open (edges not staying together). Document Released: 07/17/2001 Document  Revised: 08/12/2013 Document Reviewed: 06/03/2013 [facility] Patient Information 2015 [place], [place]. This information is not intended to replace advice given to you by your health care provider. Make sure you discuss any questions you have with your health care provider.

## 2015-04-18 NOTE — ED Provider Notes (Signed)
[facility] Emergency Department Provider Note  ____________________________________________  Time seen: Approximately 8:32 AM  I have reviewed the triage vital signs and the nursing notes.   HISTORY  Chief Complaint No chief complaint on file.   HPI Amy Mcconnell is a 64 y.o. female presents the ER for the complaint of suture removal. Patient reports that she was seen in the ER 10 days ago after accidentally cutting left fourth finger with pruning shears. Reports laceration was repaired with sutures at that time. Presents today for suture removal. Denies pain or complaints. Reports healing well.   Past Medical History  Diagnosis Date  . Other specified disorders of thyroid   . Essential hypertension, benign   . Contusion of unspecified part of lower limb   . Other and unspecified hyperlipidemia   . Contact dermatitis and other eczema due to other specified agent   . Reflux esophagitis   . Bipolar disorder     Dr. Thurmond Butts - every 3 months  . 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 Heart murmur   . Broken foot     left   . Anxiety   . Tachycardia     a. isolated episode, seen in ED with negative work up    Patient Active Problem List   Diagnosis Date Noted  . Bipolar disorder 03/30/2015  . Anxiety   . Essential hypertension 10/06/2014  . Left foot pain 08/20/2014  . Avulsion fracture of calcaneus 08/20/2014  . Chest pain 07/23/2014  . 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 Tachycardia 01/22/2014  . Smoking history 01/22/2014  . Hyperlipidemia 01/22/2014    Past Surgical History  Procedure Laterality Date  . Cataract extraction    . Salpingectomy Left   . Sterilization      Her decision since dz of bipolar    Current Outpatient Rx  Name  Route  Sig  Dispense  Refill  . acetaminophen (TYLENOL) 500 MG tablet   Oral   Take 1,000 mg by mouth every 6 (six) hours as needed.         Marland Kitchen amLODipine (NORVASC) 10 MG tablet   Oral    Take 1 tablet (10 mg total) by mouth daily.   30 tablet   6   . Ascorbic Acid (VITAMIN C) 1000 MG tablet   Oral   Take 1,000 mg by mouth daily.         Marland Kitchen aspirin 81 MG tablet   Oral   Take 81 mg by mouth daily.         Marland Kitchen atorvastatin (LIPITOR) 40 MG tablet   Oral   Take 1 tablet (40 mg total) by mouth daily.   30 tablet   11   . B Complex Vitamins (VITAMIN B COMPLEX PO)   Oral   Take by mouth daily.         . calcium carbonate (OS-CAL) 600 MG TABS tablet   Oral   Take 600 mg by mouth 2 (two) times daily with a meal.         . Cholecalciferol (VITAMIN D PO)   Oral   Take 1 tablet by mouth every morning.          . diphenhydrAMINE (BENADRYL) 25 mg capsule   Oral   Take 25 mg by mouth daily as needed for allergies. For food and fragrance sensitivities         . doxazosin (CARDURA) 8 MG tablet   Oral  Take 1 tablet (8 mg total) by mouth daily.   30 tablet   6   . fexofenadine (ALLEGRA) 180 MG tablet   Oral   Take 180 mg by mouth. 2-3 times daily prn For food and fragrance sensitivities/intolerances         . levothyroxine (SYNTHROID, LEVOTHROID) 125 MCG tablet   Oral   Take 125 mcg by mouth daily before breakfast.         . lithium carbonate (ESKALITH) 450 MG CR tablet   Oral   Take by mouth every evening.          . magnesium oxide (MAG-OX) 400 MG tablet   Oral   Take 400 mg by mouth daily.         . methocarbamol (ROBAXIN) 500 MG tablet   Oral   Take 1 tablet (500 mg total) by mouth 4 (four) times daily.   60 tablet   0   . Multiple Vitamins-Minerals (MULTIVITAMIN WITH MINERALS) tablet   Oral   Take 1 tablet by mouth daily.         . Multiple Vitamins-Minerals (VISION FORMULA/LUTEIN) TABS   Oral   Take by mouth.         . Omega-3 Fatty Acids (FISH OIL) 1000 MG CAPS   Oral   Take by mouth daily.         . Oxcarbazepine (TRILEPTAL) 300 MG tablet      Take 300 mg one tablet in the am & two tablets in the pm.          . propranolol (INDERAL) 20 MG tablet   Oral   Take 1 tablet (20 mg total) by mouth 3 (three) times daily as needed (for tachycardia).   90 tablet   3   . propranolol ER (INDERAL LA) 60 MG 24 hr capsule   Oral   Take 1 capsule (60 mg total) by mouth daily.   30 capsule   6   . sulfamethoxazole-trimethoprim (BACTRIM DS,SEPTRA DS) 800-160 MG per tablet   Oral   Take 1 tablet by mouth 2 (two) times daily.   10 tablet   0   . Tdap (BOOSTRIX) 5-2.5-18.5 LF-MCG/0.5 injection   Intramuscular   Inject 0.5 mLs into the muscle once.   0.5 mL   0   . vitamin B-12 (CYANOCOBALAMIN) 1000 MCG tablet   Oral   Take 1,000 mcg by mouth daily.         . vitamin E 1000 UNIT capsule   Oral   Take 1,000 Units by mouth daily.         Marland Kitchen zinc gluconate 50 MG tablet   Oral   Take 50 mg by mouth daily.         . ziprasidone (GEODON) 60 MG capsule   Oral   Take 60 mg by mouth daily.           Allergies Ace inhibitors; Angiotensin receptor blockers; Benicar; Poison ivy extract; and Triclosan  Family History  Problem Relation Age of Onset  . Arrhythmia Mother     A-Fib  . Heart failure Mother   . Hyperlipidemia Mother   . Hypertension Mother   . Hypertension Father   . Hyperlipidemia Father   . Mental retardation Father 24    Suicide  . Hypertension Brother   . Hyperlipidemia Brother   . Hypertension Brother   . Hyperlipidemia Brother   . Hyperlipidemia Brother   . Hypertension Brother   .  Alcohol abuse Brother   . Depression Brother   . Hyperlipidemia Brother   . Hypertension Brother     Social History History  Substance Use Topics  . Smoking status: Former Smoker -- 0.50 packs/day for 13 years    Quit date: 01/23/2000  . Smokeless tobacco: Never Used  . Alcohol Use: No    Review of Systems Constitutional: No fever/chills Eyes: No visual changes. ENT: No sore throat. Cardiovascular: Denies chest pain. Respiratory: Denies shortness of  breath. Gastrointestinal: No abdominal pain.  No nausea, no vomiting.  No diarrhea.  No constipation. Genitourinary: Negative for dysuria. Musculoskeletal: Negative for back pain. Skin: Negative for rash. Neurological: Negative for headaches, focal weakness or numbness. 10-point ROS otherwise negative.  ____________________________________________   PHYSICAL EXAM:  VITAL SIGNS: ED Triage Vitals  Enc Vitals Group     BP --      Pulse --      Resp --      Temp --      Temp src --      SpO2 --      Weight --      Height --      Head Cir --      Peak Flow --      Pain Score 04/18/15 0822 0     Pain Loc --      Pain Edu? --      Excl. in [place]? --       Constitutional: Alert and oriented. Well appearing and in no acute distress. Eyes: Conjunctivae are normal. Head: Atraumatic. Nose: No congestion/rhinnorhea. Mouth/Throat: Mucous membranes are moist.   Hematological/Lymphatic/Immunilogical: No cervical lymphadenopathy. Cardiovascular: Normal rate, regular rhythm. Grossly normal heart sounds.  Good peripheral circulation. Respiratory: Normal respiratory effort.  No retractions. Lungs CTAB. Gastrointestinal: Soft and nontender.  Musculoskeletal: No lower extremity tenderness nor edema.  Neurologic:  Normal speech and language. No gross focal neurologic deficits are appreciated. Speech is normal. No gait instability. Skin:  Skin is warm, dry and intact. No rash noted. Left fourth distal finger with x 6 sutures in place. Well approximated. No erythema. Non-tender. Sensation intact. Full range of motion. Psychiatric: Mood and affect are normal. Speech and behavior are normal.   ____________________________________________   PROCEDURES  Procedure(s) performed:  SUTURE REMOVAL Performed by: RN Consent: Verbal consent obtained. Patient identity confirmed: provided demographic data Time out: Immediately prior to procedure a "time out" was called to verify the correct  patient, procedure, equipment, support staff and site/side marked as required.  Location details: left 4th finger  Wound Appearance: clean  Sutures/Staples Removed: 6  Facility: sutures placed in this facility Patient tolerance: Patient tolerated the procedure well with no immediate complications.   ____________________________________________   INITIAL IMPRESSION / ASSESSMENT AND PLAN / ED COURSE  Pertinent labs & imaging results that were available during my care of the patient were reviewed by me and considered in my medical decision making (see chart for details).  Very well-appearing patient. No acute distress. Presents to the ER for the complaint of suture removal to left fourth finger. Laceration healed well. Patient presents for suture removal. The wound is well healed without signs of infection.  The sutures are removed. Wound care and activity instructions given. Return prn. ____________________________________________   FINAL CLINICAL IMPRESSION(S) / ED DIAGNOSES  Final diagnoses:  Visit for suture removal      Marylene Land, NP 04/18/15 8882  Nance Pear, MD 04/18/15 1012

## 2015-04-23 DIAGNOSIS — R109 Unspecified abdominal pain: Secondary | ICD-10-CM | POA: Insufficient documentation

## 2015-04-23 NOTE — Assessment & Plan Note (Signed)
POCT urine with culture. Due to results of POCT urine, will treat as msk complaint. Robaxin 500 mg up to 4 x daily as needed. Will follow.

## 2015-05-03 ENCOUNTER — Encounter: Payer: Self-pay | Admitting: Internal Medicine

## 2015-05-03 ENCOUNTER — Ambulatory Visit (INDEPENDENT_AMBULATORY_CARE_PROVIDER_SITE_OTHER): Payer: Medicare Other | Admitting: Internal Medicine

## 2015-05-03 VITALS — BP 140/72 | HR 66 | Temp 98.3°F | Wt 144.0 lb

## 2015-05-03 DIAGNOSIS — K121 Other forms of stomatitis: Secondary | ICD-10-CM

## 2015-05-03 NOTE — Patient Instructions (Signed)
Oral Ulcers Oral ulcers are painful, shallow sores around the lining of the mouth. They can affect the gums, the inside of the lips, and the cheeks. (Sores on the outside of the lips and on the face are different.) They typically first occur in school-aged children and teenagers. Oral ulcers may also be called canker sores or cold sores. CAUSES  Canker sores and cold sores can be caused by many factors including:  Infection.  Injury.  Sun exposure.  Medications.  Emotional stress.  Food allergies.  Vitamin deficiencies.  Toothpastes containing sodium lauryl sulfate. The herpes virus can be the cause of mouth ulcers. The first infection can be severe and cause 10 or more ulcers on the gums, tongue, and lips with fever and difficulty in swallowing. This infection usually occurs between the ages of 1 and 3 years.  SYMPTOMS  The typical sore is about  inch (6 mm) in size and is an oval or round ulcer with red borders. DIAGNOSIS  Your caregiver can diagnose simple oral ulcers by examination. Additional testing is usually not required.  TREATMENT  Treatment is aimed at pain relief. Generally, oral ulcers resolve by themselves within 1 to 2 weeks without medication and are not contagious unless caused by herpes (and other viruses). Antibiotics are not effective with mouth sores. Avoid direct contact with others until the ulcer is completely healed. See your caregiver for follow-up care as recommended. Also:  Offer a soft diet.  Encourage plenty of fluids to prevent dehydration. Popsicles and milk shakes can be helpful.  Avoid acidic and salty foods and drinks such as orange juice.  Infants and young children will often refuse to drink because of pain. Using a teaspoon, cup, or syringe to give small amounts of fluids frequently can help prevent dehydration.  Cold compresses on the face may help reduce pain.  Pain medication can help control soreness.  A solution of diphenhydramine  mixed with a liquid antacid can be useful to decrease the soreness of ulcers. Consult a caregiver for the dosing.  Liquids or ointments with a numbing ingredient may be helpful when used as recommended.  Older children and teenagers can rinse their mouth with a salt-water mixture (1/2 teaspoon of salt in 8 ounces of water) four times a day. This treatment is uncomfortable but may reduce the time the ulcers are present.  There are many over-the-counter throat lozenges and medications available for oral ulcers. Their effectiveness has not been studied.  Consult your medical caregiver prior to using homeopathic treatments for oral ulcers. SEEK MEDICAL CARE IF:   You think your child needs to be seen.  The pain worsens and you cannot control it.  There are 4 or more ulcers.  The lips and gums begin to bleed and crust.  A single mouth ulcer is near a tooth that is causing a toothache or pain.  Your child has a fever, swollen face, or swollen glands.  The ulcers began after starting a medication.  Mouth ulcers keep reoccurring or last more than 2 weeks.  You think your child is not taking adequate fluids. SEEK IMMEDIATE MEDICAL CARE IF:   Your child has a high fever.  Your child is unable to swallow or becomes dehydrated.  Your child looks or acts very ill.  An ulcer caused by a chemical your child accidentally put in their mouth. Document Released: 11/29/2004 Document Revised: 03/08/2014 Document Reviewed: 07/14/2009 [employer] Patient Information 2015 [employer]. This information is not intended to replace advice   given to you by your health care provider. Make sure you discuss any questions you have with your health care provider.  

## 2015-05-03 NOTE — Progress Notes (Signed)
Pre visit review using our clinic review tool, if applicable. No additional management support is needed unless otherwise documented below in the visit note. 

## 2015-05-03 NOTE — Progress Notes (Signed)
Subjective:    Patient ID: Amy Mcconnell, female    DOB: [birth year], 64 y.o.   MRN: [id number]  HPI  Pt presents to the clinic today with c/o a blister on her tongue. She noticed it this morning. She woke up with blood in her mouth. She denies tongue pain or swelling. She denies difficulty breathing. She reports that she is allergic to many fruits and veggies. She has not tried anything OTC for this.  Review of Systems      Past Medical History  Diagnosis Date  . Other specified disorders of thyroid   . Essential hypertension, benign   . Contusion of unspecified part of lower limb   . Other and unspecified hyperlipidemia   . Contact dermatitis and other eczema due to other specified agent   . Reflux esophagitis   . Bipolar disorder     Dr. Thurmond Butts - every 3 months  . 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 Heart murmur   . Broken foot     left   . Anxiety   . Tachycardia     a. isolated episode, seen in ED with negative work up    Current Outpatient Prescriptions  Medication Sig Dispense Refill  . acetaminophen (TYLENOL) 500 MG tablet Take 1,000 mg by mouth every 6 (six) hours as needed.    Marland Kitchen amLODipine (NORVASC) 10 MG tablet Take 1 tablet (10 mg total) by mouth daily. 30 tablet 6  . Ascorbic Acid (VITAMIN C) 1000 MG tablet Take 1,000 mg by mouth daily.    Marland Kitchen aspirin 81 MG tablet Take 81 mg by mouth daily.    Marland Kitchen atorvastatin (LIPITOR) 40 MG tablet Take 1 tablet (40 mg total) by mouth daily. 30 tablet 11  . B Complex Vitamins (VITAMIN B COMPLEX PO) Take by mouth daily.    . calcium carbonate (OS-CAL) 600 MG TABS tablet Take 600 mg by mouth 2 (two) times daily with a meal.    . Cholecalciferol (VITAMIN D PO) Take 1 tablet by mouth every morning.     . diphenhydrAMINE (BENADRYL) 25 mg capsule Take 25 mg by mouth daily as needed for allergies. For food and fragrance sensitivities    . doxazosin (CARDURA) 8 MG tablet Take 1 tablet (8 mg total) by mouth daily. 30 tablet 6   . fexofenadine (ALLEGRA) 180 MG tablet Take 180 mg by mouth. 2-3 times daily prn For food and fragrance sensitivities/intolerances    . levothyroxine (SYNTHROID, LEVOTHROID) 125 MCG tablet Take 125 mcg by mouth daily before breakfast.    . lithium carbonate (ESKALITH) 450 MG CR tablet Take by mouth every evening.     . magnesium oxide (MAG-OX) 400 MG tablet Take 400 mg by mouth daily.    . methocarbamol (ROBAXIN) 500 MG tablet Take 1 tablet (500 mg total) by mouth 4 (four) times daily. 60 tablet 0  . Multiple Vitamins-Minerals (MULTIVITAMIN WITH MINERALS) tablet Take 1 tablet by mouth daily.    . Multiple Vitamins-Minerals (VISION FORMULA/LUTEIN) TABS Take by mouth.    . Omega-3 Fatty Acids (FISH OIL) 1000 MG CAPS Take by mouth daily.    . Oxcarbazepine (TRILEPTAL) 300 MG tablet Take 300 mg one tablet in the am & two tablets in the pm.    . propranolol (INDERAL) 20 MG tablet Take 1 tablet (20 mg total) by mouth 3 (three) times daily as needed (for tachycardia). 90 tablet 3  . propranolol ER (INDERAL LA) 60 MG 24 hr  capsule Take 1 capsule (60 mg total) by mouth daily. 30 capsule 6  . sulfamethoxazole-trimethoprim (BACTRIM DS,SEPTRA DS) 800-160 MG per tablet Take 1 tablet by mouth 2 (two) times daily. 10 tablet 0  . Tdap (BOOSTRIX) 5-2.5-18.5 LF-MCG/0.5 injection Inject 0.5 mLs into the muscle once. 0.5 mL 0  . vitamin B-12 (CYANOCOBALAMIN) 1000 MCG tablet Take 1,000 mcg by mouth daily.    . vitamin E 1000 UNIT capsule Take 1,000 Units by mouth daily.    Marland Kitchen zinc gluconate 50 MG tablet Take 50 mg by mouth daily.    . ziprasidone (GEODON) 60 MG capsule Take 60 mg by mouth daily.     No current facility-administered medications for this visit.    Allergies  Allergen Reactions  . Ace Inhibitors     Angioedema   . Angiotensin Receptor Blockers     Angioedema   . Benicar [Olmesartan]     Mouth & Lip Swelling, "I swell from the waist down". Throat swelling  . Poison Ivy Extract [Extract Of  Poison Ivy]     Blisters  . Triclosan     Family History  Problem Relation Age of Onset  . Arrhythmia Mother     A-Fib  . Heart failure Mother   . Hyperlipidemia Mother   . Hypertension Mother   . Hypertension Father   . Hyperlipidemia Father   . Mental retardation Father 22    Suicide  . Hypertension Brother   . Hyperlipidemia Brother   . Hypertension Brother   . Hyperlipidemia Brother   . Hyperlipidemia Brother   . Hypertension Brother   . Alcohol abuse Brother   . Depression Brother   . Hyperlipidemia Brother   . Hypertension Brother     History   Social History  . Marital Status: Divorced    Spouse Name: N/A  . Number of Children: 0  . Years of Education: 14   Occupational History  . Not on file.   Social History Main Topics  . Smoking status: Former Smoker -- 0.50 packs/day for 13 years    Quit date: 01/23/2000  . Smokeless tobacco: Never Used  . Alcohol Use: No  . Drug Use: No  . Sexual Activity: Not on file   Other Topics Concern  . Not on file   Social History Narrative   Amy Mcconnell grew up in [place], [place]. She lives in [place]. Amy Mcconnell is divorced. She has 2 cats (Charlie and Angie). 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 She also volunteers 2 days at [facility]. She enjoys reading, garding and cooking.     Constitutional: Denies fever, malaise, fatigue, headache or abrupt weight changes.  HEENT: Pt reports blister on tongue. Denies eye pain, eye redness, ear pain, ringing in the ears, wax buildup, runny nose, nasal congestion, bloody nose, or sore throat. Respiratory: Denies difficulty breathing, shortness of breath, cough or sputum production.   Cardiovascular: Denies chest pain, chest tightness, palpitations or swelling in the hands or feet.    No other specific complaints in a complete review of systems (except as listed in HPI above).  Objective:   Physical Exam  BP 140/72 mmHg  Pulse 66  Temp(Src) 98.3 F (36.8 C)  (Oral)  Wt 144 lb (65.318 kg)  SpO2 98% Wt Readings from Last 3 Encounters:  05/03/15 144 lb (65.318 kg)  04/07/15 139 lb 12.8 oz (63.413 kg)  03/30/15 139 lb 4 oz (63.163 kg)    General: Appears her stated age, well  developed, well nourished in NAD. HEENT: Head: normal shape and size; Throat/Mouth: Pea sized ulcer noted on roof of mouth. Neck: No adenopathy noted.  Cardiovascular: Normal rate and rhythm. S1,S2 noted.  Pulmonary/Chest: Normal effort and positive vesicular breath sounds. No respiratory distress. No wheezes, rales or ronchi noted.    BMET    Component Value Date/Time   NA 138 03/21/2015 1402   NA 143 11/23/2014   NA 138 01/17/2014 0842   K 4.6 03/21/2015 1402   K 4.0 01/17/2014 0842   CL 105 03/21/2015 1402   CL 104 01/17/2014 0842   CO2 30 03/21/2015 1402   CO2 29 01/17/2014 0842   GLUCOSE 100* 03/21/2015 1402   GLUCOSE 84 01/17/2014 0842   BUN 21 03/21/2015 1402   BUN 22* 01/17/2014 0842   CREATININE 0.93 03/21/2015 1402   CREATININE 0.9 11/23/2014   CREATININE 0.74 01/17/2014 0842   CALCIUM 10.0 03/21/2015 1402   CALCIUM 9.4 01/17/2014 0842   GFRNONAA >60 01/17/2014 0842   GFRAA >60 01/17/2014 0842    Lipid Panel     Component Value Date/Time   CHOL 213* 03/21/2015 1402   TRIG 66.0 03/21/2015 1402   HDL 96.90 03/21/2015 1402   CHOLHDL 2 03/21/2015 1402   VLDL 13.2 03/21/2015 1402   LDLCALC 103* 03/21/2015 1402    CBC    Component Value Date/Time   WBC 4.3 03/26/2014 0854   WBC 4.8 01/17/2014 0842   RBC 4.64 03/26/2014 0854   RBC 4.64 01/17/2014 0842   HGB 13.2 03/26/2014 0854   HGB 13.1 01/17/2014 0842   HCT 39.5 03/26/2014 0854   HCT 39.9 01/17/2014 0842   PLT 168.0 03/26/2014 0854   PLT 141* 01/17/2014 0842   MCV 85.2 03/26/2014 0854   MCV 86 01/17/2014 0842   MCH 28.3 01/17/2014 0842   MCHC 33.4 03/26/2014 0854   MCHC 32.8 01/17/2014 0842   RDW 14.6 03/26/2014 0854   RDW 14.1 01/17/2014 0842   LYMPHSABS 1.5 03/26/2014 0854     MONOABS 0.4 03/26/2014 0854   EOSABS 0.1 03/26/2014 0854   BASOSABS 0.0 03/26/2014 0854    Hgb A1C No results found for: HGBA1C       Assessment & Plan:   Oral ulcer:  She thinks this is secondary to allergic reaction to tomato sauce  She will gargle with salt water gargles She will let me know if the ulcer seems to persist or worsen Reassured her that this should resolve within 7-10 days  RTC as needed or if symptoms persist or worsen

## 2015-05-12 ENCOUNTER — Telehealth: Payer: Self-pay | Admitting: Nurse Practitioner

## 2015-05-12 NOTE — Telephone Encounter (Signed)
Patient scheduled 05/13/15 FYI

## 2015-05-12 NOTE — Telephone Encounter (Signed)
[facility] Patient Name: Amy Mcconnell Gender: Female DOB: May 23, 1951 Age: 64 Y 80 M 14 D Return Phone Number: [phone] (Primary) Address: City/State/Zip: [place] [address] Client [facility] Client Site [place] [facility] Day Physician Deborra Medina Contact Type Call Call Type Triage / Clinical Relationship To Patient Self Appointment Disposition EMR Appointment Scheduled Info pasted into Epic Yes Return Phone Number [phone] (Primary) Chief Complaint Wound Infection Initial Comment Caller states, she has some pinkness around a scab on her leg, no pain, just wants to know if it may be infected PreDisposition Call Doctor Nurse Assessment Nurse: Mechele Dawley, RN, Amy Date/Time Eilene Ghazi Time): 05/12/2015 1:17:47 PM Confirm and document reason for call. If symptomatic, describe symptoms. ---PINK AREA AROUND THE SCAB. CALLER STATES THAT IT HAPPENED LAST WEDNESDAY. NO FEVER. NO DIABETES. Has the patient traveled out of the country within the last 30 days? ---Not Applicable Does the patient require triage? ---Yes Related visit to physician within the last 2 weeks? ---Yes Does the PT have any chronic conditions? (i.e. diabetes, asthma, etc.) ---Yes List chronic conditions. ---HYPERTENSION Guidelines Guideline Title Affirmed Question Affirmed Notes Nurse Date/Time (Eastern Time) Wound Infection [1] Red area or streak AND [2] no fever [place], RN, Amy 05/12/2015 1:18:34 PM Disp. Time Eilene Ghazi Time) Disposition Final User 05/12/2015 12:56:38 PM Attempt made - message left [place], [place], Amy 05/12/2015 1:19:35 PM See Physician within 24 Hours Yes [place], RN, Amy Caller Understands: Yes Disagree/Comply: Comply PLEASE NOTE: All timestamps contained within this report are represented as [place] Standard Time. CONFIDENTIALTY NOTICE:  This fax transmission is intended only for the addressee. It contains information that is legally privileged, confidential or otherwise protected from use or disclosure. If you are not the intended recipient, you are strictly prohibited from reviewing, disclosing, copying using or disseminating any of this information or taking any action in reliance on or regarding this information. If you have received this fax in error, please notify us immediately by telephone so that we can arrange for its return to [place]. Phone: [phone], Toll-Free: [phone], Fax: [phone] Page: 2 of 2 Call Id: [id number] Care Advice Given Per Guideline SEE PHYSICIAN WITHIN 24 HOURS: CALL BACK IF: * Fever occurs * You become worse. CARE ADVICE per Wound Infection (Adult) guideline. After Care Instructions Given Call Event Type User Date / Time Description

## 2015-05-13 ENCOUNTER — Ambulatory Visit: Payer: Self-pay | Admitting: Nurse Practitioner

## 2015-05-16 ENCOUNTER — Ambulatory Visit: Payer: Self-pay | Admitting: Nurse Practitioner

## 2015-05-16 ENCOUNTER — Telehealth: Payer: Self-pay | Admitting: Nurse Practitioner

## 2015-05-16 NOTE — Telephone Encounter (Signed)
Pt called to cancel appt. Pt stated that she is doing better. Please advise to cancel off schedule.msn

## 2015-05-16 NOTE — Telephone Encounter (Signed)
She can cancel

## 2015-05-18 ENCOUNTER — Telehealth: Payer: Self-pay

## 2015-05-18 NOTE — Telephone Encounter (Signed)
Received BP record from pt: 02/15/15 97/64 02/18/15 134/74 02/19/15 136/82 02/21/15 128/94 02/21/18 124/86   140/90 02/23/15 153/95 02/24/15 136/89 02/28/15 127/79 03/01/15 138/89   145/94 Started propranolol 60 mg daily 03/02/15 118/88 03/03/15 120/80 03/04/15 138/78 03/05/15 129/80 4/31/16 144/89 03/06/15  136/82 03/07/15  139/85 03/08/15  119/81 03/09/15  118/68 03/12/15  142/91   117/75 03/13/15  138/96   116/75 03/14/15  136/86   120/79 03/15/15 121/77 03/16/15 122/80   127/79 03/17/15 123/78 03/18/15 129/84   123/77 03/19/15 119/79 03/21/15 126/86 03/22/15 108/78 03/23/15 127/79 03/25/15 117/67 03/26/15 107/68 03/27/15 128/84 03/28/15 106/78 03/29/15 137/89 03/30/15 136/81 Per Dr. Rockey Situ, #s look good.

## 2015-05-25 DIAGNOSIS — F3170 Bipolar disorder, currently in remission, most recent episode unspecified: Secondary | ICD-10-CM | POA: Diagnosis not present

## 2015-06-12 ENCOUNTER — Telehealth: Payer: Self-pay | Admitting: Cardiovascular Disease

## 2015-06-12 NOTE — Telephone Encounter (Signed)
Blood pressure numbers are a little low, (numbers she gave [place]) Would cut the amlodipine in 1/2 daily

## 2015-06-13 NOTE — Telephone Encounter (Signed)
S/w pt regarding Dr. Donivan Scull recommendations. Pt verbalized understanding and states she will take 1/2 amlodipine and continue to monitor BP

## 2015-07-28 ENCOUNTER — Other Ambulatory Visit: Payer: Self-pay | Admitting: Nurse Practitioner

## 2015-07-29 DIAGNOSIS — Z23 Encounter for immunization: Secondary | ICD-10-CM | POA: Diagnosis not present

## 2015-08-02 ENCOUNTER — Telehealth: Payer: Self-pay | Admitting: *Deleted

## 2015-08-02 ENCOUNTER — Other Ambulatory Visit: Payer: Self-pay | Admitting: Cardiovascular Disease

## 2015-08-02 NOTE — Telephone Encounter (Signed)
Amlodipine 10 mg sent to local pharmacy for refill no others were requested from pharmacy at this time. LMOVM to make pt aware that medication has been sent in to her local [place].

## 2015-08-02 NOTE — Telephone Encounter (Signed)
Pt calling trying to figure out of [facility] has sent over refill request. She is not sure what medications but has called them 3 times, she may call back with the medications but wanted to check if we had received them. Please call patient to let her know if we have received them.

## 2015-08-15 DIAGNOSIS — F329 Major depressive disorder, single episode, unspecified: Secondary | ICD-10-CM | POA: Diagnosis not present

## 2015-08-15 DIAGNOSIS — Z79899 Other long term (current) drug therapy: Secondary | ICD-10-CM | POA: Diagnosis not present

## 2015-08-19 ENCOUNTER — Encounter: Payer: Self-pay | Admitting: Cardiovascular Disease

## 2015-08-19 ENCOUNTER — Ambulatory Visit (INDEPENDENT_AMBULATORY_CARE_PROVIDER_SITE_OTHER): Payer: Medicare Other | Admitting: Cardiovascular Disease

## 2015-08-19 VITALS — BP 120/70 | HR 52 | Ht 67.5 in | Wt 139.5 lb

## 2015-08-19 DIAGNOSIS — R001 Bradycardia, unspecified: Secondary | ICD-10-CM | POA: Insufficient documentation

## 2015-08-19 DIAGNOSIS — E785 Hyperlipidemia, unspecified: Secondary | ICD-10-CM

## 2015-08-19 DIAGNOSIS — I10 Essential (primary) hypertension: Secondary | ICD-10-CM

## 2015-08-19 DIAGNOSIS — R000 Tachycardia, unspecified: Secondary | ICD-10-CM | POA: Diagnosis not present

## 2015-08-19 MED ORDER — ATORVASTATIN CALCIUM 80 MG PO TABS: 80.0000 mg | ORAL_TABLET | Freq: Every day | ORAL | Status: DC

## 2015-08-19 NOTE — Assessment & Plan Note (Signed)
Blood pressure is well controlled on today's visit. No changes made to the medications. 

## 2015-08-19 NOTE — Assessment & Plan Note (Signed)
Asymptomatic bradycardia. She prefers to continue her current medications

## 2015-08-19 NOTE — Patient Instructions (Addendum)
You are doing well.  Please increase the lipitor up to 80 mg daily Recheck lipids in 3 to 6 months Call in march for labs  Please call us if you have new issues that need to be addressed before your next appt.  Your physician wants you to follow-up in: 12 months.  You will receive a reminder letter in the mail two months in advance. If you don't receive a letter, please call our office to schedule the follow-up appointment.
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
## 2015-08-19 NOTE — Assessment & Plan Note (Signed)
Cholesterol discussed with her. Still slightly elevated. Several risk factors including long history of smoking. Recommended she increase her Lipitor up to 80 mg daily. We did discuss a CT coronary calcium score. She's not interested at this time

## 2015-08-24 DIAGNOSIS — F3170 Bipolar disorder, currently in remission, most recent episode unspecified: Secondary | ICD-10-CM | POA: Diagnosis not present

## 2015-10-17 ENCOUNTER — Telehealth: Payer: Self-pay | Admitting: *Deleted

## 2015-10-17 NOTE — Telephone Encounter (Signed)
BP is still high despite increasing amlodipine to 10 mg daily.

## 2015-10-17 NOTE — Telephone Encounter (Signed)
If blood pressure continues to run high, could add extra Cardura 4 mg in the morning, take the 8 mg in the evening If it continues to run high, after one week could increase Cardura up to 8 mg twice a day Stay on amlodipine 10 mg daily

## 2015-10-17 NOTE — Telephone Encounter (Signed)
Pt c/o medication issue:  1. Name of Medication: amlodipine   2. How are you currently taking this medication (dosage and times per day)? Not taking it every day and is taking full dose of 10  mg   3. Are you having a reaction (difficulty breathing--STAT)? No   4. What is your medication issue?  Was told by [place] to take half a pill but pt has been taking whole pill. Was to take 5 mg. Not having issues just that she upped the dose.   Pt c/o BP issue: STAT if pt c/o blurred vision, one-sided weakness or slurred speech  1. What are your last 5 BP readings?  1.142/86 today 2.164/102 10/16/15 3. 162/107 10/15/15 4. 126/80 10/14/15 5.156/103 10/13/15 2. Are you having any other symptoms (ex. Dizziness, headache, blurred vision, passed out)? No  3. What is your BP issue? Just worried about her numbers.   *STAT* If patient is at the pharmacy, call can be transferred to refill team.   1. Which medications need to be refilled? (please list name of each medication and dose if known) Propanolol 60 mg   2. Which pharmacy/location (including street and city if local pharmacy) is medication to be sent to? [facility] [address]  3. Do they need a 30 day or 90 day supply? 90 day

## 2015-10-18 NOTE — Telephone Encounter (Addendum)
Spoke w/ pt.  Advised her of Dr. Donivan Scull recommendation. She reports that she is under quite a bit of stress, as her ex husband has 2 types of cancer, had an MI and unsuccessful cath yesterday. Advised her that these med changes may be temporary, as we need to get her BP under control. She is agreeable to changes and will continue to monitor.  Asked her to call back if her readings do not improve.

## 2015-10-19 ENCOUNTER — Other Ambulatory Visit: Payer: Self-pay | Admitting: Cardiovascular Disease

## 2015-10-20 ENCOUNTER — Other Ambulatory Visit: Payer: Self-pay

## 2015-10-20 MED ORDER — DOXAZOSIN MESYLATE 8 MG PO TABS: 8.0000 mg | ORAL_TABLET | Freq: Two times a day (BID) | ORAL | Status: DC

## 2015-10-20 NOTE — Telephone Encounter (Signed)
Pt states her dose needs to be doubled.please call

## 2015-12-05 DIAGNOSIS — F3170 Bipolar disorder, currently in remission, most recent episode unspecified: Secondary | ICD-10-CM | POA: Diagnosis not present

## 2015-12-07 DIAGNOSIS — L821 Other seborrheic keratosis: Secondary | ICD-10-CM | POA: Diagnosis not present

## 2016-01-23 DIAGNOSIS — Z23 Encounter for immunization: Secondary | ICD-10-CM | POA: Diagnosis not present

## 2016-01-26 ENCOUNTER — Other Ambulatory Visit
Admission: RE | Admit: 2016-01-26 | Discharge: 2016-01-26 | Disposition: A | Discharge status: Home / Self Care | Payer: Medicare Other | Source: Ambulatory Visit | Attending: Cardiovascular Disease | Admitting: Cardiovascular Disease

## 2016-01-26 DIAGNOSIS — E785 Hyperlipidemia, unspecified: Secondary | ICD-10-CM | POA: Diagnosis not present

## 2016-01-26 LAB — LIPID PANEL
Cholesterol: 158 mg/dL (ref 0–200)
HDL: 81 mg/dL
LDL Cholesterol: 69 mg/dL (ref 0–99)
Total CHOL/HDL Ratio: 2 ratio
Triglycerides: 42 mg/dL
VLDL: 8 mg/dL (ref 0–40)

## 2016-01-26 LAB — HEPATIC FUNCTION PANEL
ALT: 32 U/L (ref 14–54)
AST: 31 U/L (ref 15–41)
Albumin: 4.3 g/dL (ref 3.5–5.0)
Alkaline Phosphatase: 78 U/L (ref 38–126)
BILIRUBIN TOTAL: 0.5 mg/dL (ref 0.3–1.2)
Total Protein: 7.2 g/dL (ref 6.5–8.1)

## 2016-02-03 ENCOUNTER — Telehealth: Payer: Self-pay | Admitting: Cardiovascular Disease

## 2016-02-03 NOTE — Telephone Encounter (Signed)
Patient wanted gollan to know [facility]

## 2016-02-07 NOTE — Telephone Encounter (Signed)
Pt received her lab results and was very pleased She called to let us know how she did well on her labs. She states she stop taking elevators and is only doing stairs  And this is how she's doing a lot better.

## 2016-02-13 ENCOUNTER — Other Ambulatory Visit: Payer: Self-pay

## 2016-02-13 MED ORDER — AMLODIPINE BESYLATE 5 MG PO TABS: 5.0000 mg | ORAL_TABLET | Freq: Every day | ORAL | Status: DC

## 2016-02-13 NOTE — Telephone Encounter (Signed)
Refill sent for amlodipine 5 mg  

## 2016-03-02 ENCOUNTER — Other Ambulatory Visit: Payer: Self-pay | Admitting: Internal Medicine

## 2016-03-02 DIAGNOSIS — Z1231 Encounter for screening mammogram for malignant neoplasm of breast: Secondary | ICD-10-CM

## 2016-03-05 DIAGNOSIS — F3170 Bipolar disorder, currently in remission, most recent episode unspecified: Secondary | ICD-10-CM | POA: Diagnosis not present

## 2016-03-09 ENCOUNTER — Other Ambulatory Visit: Payer: Self-pay | Admitting: Cardiovascular Disease

## 2016-03-12 ENCOUNTER — Other Ambulatory Visit: Payer: Self-pay | Admitting: Internal Medicine

## 2016-03-12 ENCOUNTER — Ambulatory Visit
Admission: RE | Admit: 2016-03-12 | Discharge: 2016-03-12 | Disposition: A | Discharge status: Home / Self Care | Payer: Medicare Other | Source: Ambulatory Visit | Attending: Internal Medicine | Admitting: Internal Medicine

## 2016-03-12 DIAGNOSIS — Z1231 Encounter for screening mammogram for malignant neoplasm of breast: Secondary | ICD-10-CM

## 2016-03-30 ENCOUNTER — Encounter: Payer: Self-pay | Admitting: Family Medicine

## 2016-03-30 ENCOUNTER — Ambulatory Visit (INDEPENDENT_AMBULATORY_CARE_PROVIDER_SITE_OTHER): Payer: Medicare Other | Admitting: Family Medicine

## 2016-03-30 ENCOUNTER — Encounter: Payer: BC Managed Care – PPO | Admitting: Nurse Practitioner

## 2016-03-30 VITALS — BP 112/76 | HR 61 | Temp 98.2°F | Ht 67.5 in | Wt 143.8 lb

## 2016-03-30 DIAGNOSIS — N393 Stress incontinence (female) (male): Secondary | ICD-10-CM

## 2016-03-30 DIAGNOSIS — I10 Essential (primary) hypertension: Secondary | ICD-10-CM

## 2016-03-30 DIAGNOSIS — Z13820 Encounter for screening for osteoporosis: Secondary | ICD-10-CM

## 2016-03-30 DIAGNOSIS — T781XXS Other adverse food reactions, not elsewhere classified, sequela: Secondary | ICD-10-CM

## 2016-03-30 DIAGNOSIS — Z1159 Encounter for screening for other viral diseases: Secondary | ICD-10-CM

## 2016-03-30 MED ORDER — EPINEPHRINE 0.3 MG/0.3ML IJ SOAJ: 0.3000 mg | Freq: Once | INTRAMUSCULAR | Status: DC

## 2016-03-30 NOTE — Progress Notes (Signed)
Patient ID: MONASIA SPEDALE, female   DOB: January 28, 1951, 65 y.o.   MRN: [id number]  Tommi Rumps, MD Phone: [phone]  Annunziata Kenward Ledet is a 65 y.o. female who presents today for follow-up.  Does note she has some stress incontinence with lifting heavy boxes. No other urinary symptoms. No abdominal pain. His been going on for years. She also reports food allergies where she gets swelling in her legs and swelling of her tongue and lip tingling. She has an EpiPen though it is expired. She has never had to use her EpiPen. He treats with Benadryl. Taking propranolol and amlodipine for blood pressure. And Cardura for blood pressure.  Diet is described as poor relating to her dietary restrictions given or food allergies. She going to [facility] after this visit. She does exercise a lot by doing weights and for exercises. Gets 18,000 steps a day typically. Mammogram up-to-date within the last several weeks Reports colonoscopy done at age 69 with no polyps. Pap smear done in 2011. Due for follow-up with her gynecologist. No prior hepatitis C screening. Declines HIV testing. Last DEXA scan was 2012. Reports she had a pneumonia shot one month ago.  Active Ambulatory Problems    Diagnosis Date Noted  . Tachycardia 01/22/2014  . Smoking history 01/22/2014  . Hyperlipidemia 01/22/2014  . 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 Chest pain 07/23/2014  . Left foot pain 08/20/2014  . Avulsion fracture of calcaneus 08/20/2014  . Essential hypertension 10/06/2014  . Anxiety   . Bipolar disorder ([place]) 03/30/2015  . Flank pain 04/23/2015  . Bradycardia 08/19/2015  . Stress incontinence 04/01/2016   Resolved Ambulatory Problems    Diagnosis Date Noted  . No Resolved Ambulatory Problems   Past Medical History  Diagnosis Date  . Other specified disorders of thyroid   . Essential hypertension, benign   . Contusion of unspecified part of lower limb   . Other and unspecified hyperlipidemia   . Contact  dermatitis and other eczema due to other specified agent   . Reflux esophagitis   . 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 Heart murmur   . Broken foot     Family History  Problem Relation Age of Onset  . Arrhythmia Mother     A-Fib  . Heart failure Mother   . Hyperlipidemia Mother   . Hypertension Mother   . Hypertension Father   . Hyperlipidemia Father   . Mental retardation Father 46    Suicide  . Hypertension Brother   . Hyperlipidemia Brother   . Hypertension Brother   . Hyperlipidemia Brother   . Hyperlipidemia Brother   . Hypertension Brother   . Alcohol abuse Brother   . Depression Brother   . Hyperlipidemia Brother   . Hypertension Brother   . Breast cancer Maternal Aunt     Social History   Social History  . Marital Status: Divorced    Spouse Name: N/A  . Number of Children: 0  . Years of Education: 14   Occupational History  . Not on file.   Social History Main Topics  . Smoking status: Former Smoker -- 0.50 packs/day for 13 years    Quit date: 01/23/2000  . Smokeless tobacco: Never Used  . Alcohol Use: No  . Drug Use: No  . Sexual Activity: Not on file   Other Topics Concern  . Not on file   Social History Narrative   Ms. Monsivais grew up in [place], [place]. She lives in  [place] Ms. Shinnick is divorced. She has 2 cats (Charlie and Angie). She volunteers at [employer]. She also volunteers 2 days at [facility]. She enjoys reading, garding and cooking.    ROS  General:  Negative for nexplained weight loss, fever Skin: Negative for new or changing mole, sore that won't heal HEENT: Negative for trouble hearing, trouble seeing, ringing in ears, mouth sores, hoarseness, change in voice, dysphagia. CV:  Negative for chest pain, dyspnea, edema, palpitations Resp: Negative for cough, dyspnea, hemoptysis GI: Negative for nausea, vomiting, diarrhea, constipation, abdominal pain, melena, hematochezia. GU: Positive for stress  incontinence, Negative for dysuria, urinary hesitance, hematuria, vaginal or penile discharge, polyuria, sexual difficulty, lumps in testicle or breasts MSK: Negative for muscle cramps or aches, joint pain or swelling Neuro: Negative for headaches, weakness, numbness, dizziness, passing out/fainting Psych: Negative for depression, anxiety, memory problems  Objective  Physical Exam Filed Vitals:   03/30/16 1316  BP: 112/76  Pulse: 61  Temp: 98.2 F (36.8 C)    BP Readings from Last 3 Encounters:  03/30/16 112/76  08/19/15 120/70  05/03/15 140/72   Wt Readings from Last 3 Encounters:  03/30/16 143 lb 12.8 oz (65.227 kg)  08/19/15 139 lb 8 oz (63.277 kg)  05/03/15 144 lb (65.318 kg)    Physical Exam  Constitutional: She is well-developed, well-nourished, and in no distress.  HENT:  Head: Normocephalic and atraumatic.  Right Ear: External ear normal.  Left Ear: External ear normal.  Mouth/Throat: Oropharynx is clear and moist. No oropharyngeal exudate.  Eyes: Conjunctivae are normal. Pupils are equal, round, and reactive to light.  Neck: Neck supple.  Cardiovascular: Normal rate, regular rhythm and normal heart sounds.   Pulmonary/Chest: Effort normal and breath sounds normal.  Abdominal: Soft. Bowel sounds are normal. She exhibits no distension. There is no tenderness. There is no rebound and no guarding.  Musculoskeletal: She exhibits no edema.  Lymphadenopathy:    She has no cervical adenopathy.  Neurological: She is alert. Gait normal.  Skin: Skin is warm and dry. She is not diaphoretic.  Psychiatric: Mood and affect normal.     Assessment/Plan:   Stress incontinence Patient notes mild stress incontinence. No other urinary symptoms. Discussed Kegel exercises. She'll continue to monitor.  Essential hypertension At goal. Continue current medications.  Oral allergy syndrome Continues to have intermittent issues with this. Has seen an allergist previously. Managed  with Benadryl and avoidance typically. EpiPen prescribed. Discussed potential for interaction with EpiPen and propranolol.    Orders Placed This Encounter  Procedures  . Hepatitis C Antibody    Standing Status: Future     Number of Occurrences:      Standing Expiration Date: 03/30/2017  . Basic Metabolic Panel (BMET)    Standing Status: Future     Number of Occurrences:      Standing Expiration Date: 03/30/2017  . TSH    Standing Status: Future     Number of Occurrences:      Standing Expiration Date: 03/30/2017    Meds ordered this encounter  Medications  . EPINEPHrine 0.3 mg/0.3 mL IJ SOAJ injection    Sig: Inject 0.3 mLs (0.3 mg total) into the muscle once.    Dispense:  2 Device    Refill:  0   Health maintenance: Hepatitis C screening will be updated. Patient declined HIV screening. Colonoscopy appears to be up-to-date. She will schedule an appointment with her gynecologist for Pap smear. Mammogram is up-to-date.  Randall Hiss  Caryl Bis, MD [place]

## 2016-03-30 NOTE — Patient Instructions (Signed)
Nice to see you. We will obtain lab work at her convenience. I sent in an EpiPen for you to have on hand. Please set up an appointment with your gynecologist for Pap smear.

## 2016-03-30 NOTE — Progress Notes (Signed)
Pre visit review using our clinic review tool, if applicable. No additional management support is needed unless otherwise documented below in the visit note. 

## 2016-04-01 DIAGNOSIS — N393 Stress incontinence (female) (male): Secondary | ICD-10-CM | POA: Insufficient documentation

## 2016-04-01 NOTE — Assessment & Plan Note (Signed)
Continues to have intermittent issues with this. Has seen an allergist previously. Managed with Benadryl and avoidance typically. EpiPen prescribed. Discussed potential for interaction with EpiPen and propranolol.

## 2016-04-01 NOTE — Assessment & Plan Note (Signed)
Patient notes mild stress incontinence. No other urinary symptoms. Discussed Kegel exercises. She'll continue to monitor.

## 2016-04-01 NOTE — Assessment & Plan Note (Signed)
At goal. Continue current medications. 

## 2016-04-06 ENCOUNTER — Other Ambulatory Visit (INDEPENDENT_AMBULATORY_CARE_PROVIDER_SITE_OTHER): Payer: Medicare Other

## 2016-04-06 ENCOUNTER — Telehealth: Payer: Self-pay | Admitting: *Deleted

## 2016-04-06 DIAGNOSIS — Z1159 Encounter for screening for other viral diseases: Secondary | ICD-10-CM | POA: Diagnosis not present

## 2016-04-06 DIAGNOSIS — I10 Essential (primary) hypertension: Secondary | ICD-10-CM | POA: Diagnosis not present

## 2016-04-06 DIAGNOSIS — F3170 Bipolar disorder, currently in remission, most recent episode unspecified: Secondary | ICD-10-CM | POA: Diagnosis not present

## 2016-04-06 DIAGNOSIS — F32A Depression, unspecified: Secondary | ICD-10-CM

## 2016-04-06 DIAGNOSIS — F319 Bipolar disorder, unspecified: Secondary | ICD-10-CM | POA: Diagnosis not present

## 2016-04-06 DIAGNOSIS — F329 Major depressive disorder, single episode, unspecified: Secondary | ICD-10-CM | POA: Diagnosis not present

## 2016-04-06 DIAGNOSIS — Z79899 Other long term (current) drug therapy: Secondary | ICD-10-CM

## 2016-04-06 LAB — COMPREHENSIVE METABOLIC PANEL
ALBUMIN: 4.4 g/dL (ref 3.5–5.2)
ALK PHOS: 81 U/L (ref 39–117)
ALT: 36 U/L — AB (ref 0–35)
AST: 31 U/L (ref 0–37)
BILIRUBIN TOTAL: 0.3 mg/dL (ref 0.2–1.2)
BUN: 27 mg/dL — ABNORMAL HIGH (ref 6–23)
CO2: 26 mEq/L (ref 19–32)
CREATININE: 0.83 mg/dL (ref 0.40–1.20)
Calcium: 9.6 mg/dL (ref 8.4–10.5)
Chloride: 108 mEq/L (ref 96–112)
GFR: 73.23 mL/min (ref >60.00)
GLUCOSE: 88 mg/dL (ref 70–99)
Potassium: 4.3 mEq/L (ref 3.5–5.1)
Sodium: 143 mEq/L (ref 135–145)
Total Protein: 7 g/dL (ref 6.0–8.3)

## 2016-04-06 LAB — CBC WITH DIFFERENTIAL/PLATELET
Basophils Absolute: 0 10*3/uL (ref 0.0–0.1)
Basophils Relative: 0.5 % (ref 0.0–3.0)
Eosinophils Absolute: 0.1 10*3/uL (ref 0.0–0.7)
Eosinophils Relative: 1 % (ref 0.0–5.0)
HEMATOCRIT: 38.1 % (ref 36.0–46.0)
Hemoglobin: 12.5 g/dL (ref 12.0–15.0)
LYMPHS PCT: 24 % (ref 12.0–46.0)
Lymphs Abs: 1.2 10*3/uL (ref 0.7–4.0)
MCHC: 32.8 g/dL (ref 30.0–36.0)
MCV: 84.5 fl (ref 78.0–100.0)
MONOS PCT: 7.9 % (ref 3.0–12.0)
Monocytes Absolute: 0.4 10*3/uL (ref 0.1–1.0)
NEUTROS ABS: 3.4 10*3/uL (ref 1.4–7.7)
Neutrophils Relative %: 66.6 % (ref 43.0–77.0)
Platelets: 159 10*3/uL (ref 150.0–400.0)
RBC: 4.51 Mil/uL (ref 3.87–5.11)
RDW: 15.4 % (ref 11.5–15.5)
WBC: 5.1 10*3/uL (ref 4.0–10.5)

## 2016-04-06 LAB — TSH: TSH: 1.68 u[IU]/mL (ref 0.35–4.50)

## 2016-04-06 NOTE — Telephone Encounter (Signed)
Pt came in for labs this morning & had a Rx with additional labs requested by Dr. Viona Gilmore. Lanetta Inch. Please fax results to: [phone] (its their office number & fax number).

## 2016-04-06 NOTE — Addendum Note (Signed)
Addended by: Leeanne Rio on: 04/06/2016 11:24 AM   Modules accepted: Orders

## 2016-04-07 LAB — LITHIUM LEVEL: LITHIUM LVL: 0.5 meq/L — AB (ref 0.80–1.40)

## 2016-04-07 LAB — HEPATITIS C ANTIBODY: HCV AB: NEGATIVE

## 2016-04-09 NOTE — Telephone Encounter (Signed)
Labs faxed

## 2016-04-10 ENCOUNTER — Other Ambulatory Visit: Payer: Self-pay | Admitting: Family Medicine

## 2016-04-10 DIAGNOSIS — R748 Abnormal levels of other serum enzymes: Secondary | ICD-10-CM

## 2016-04-16 DIAGNOSIS — Z779 Other contact with and (suspected) exposures hazardous to health: Secondary | ICD-10-CM | POA: Diagnosis not present

## 2016-04-16 DIAGNOSIS — Z01419 Encounter for gynecological examination (general) (routine) without abnormal findings: Secondary | ICD-10-CM | POA: Diagnosis not present

## 2016-04-23 ENCOUNTER — Other Ambulatory Visit (INDEPENDENT_AMBULATORY_CARE_PROVIDER_SITE_OTHER): Payer: Medicare Other

## 2016-04-23 DIAGNOSIS — R748 Abnormal levels of other serum enzymes: Secondary | ICD-10-CM | POA: Diagnosis not present

## 2016-04-23 LAB — COMPREHENSIVE METABOLIC PANEL
ALT: 37 U/L — ABNORMAL HIGH (ref 0–35)
AST: 29 U/L (ref 0–37)
Albumin: 4.6 g/dL (ref 3.5–5.2)
Alkaline Phosphatase: 84 U/L (ref 39–117)
BUN: 30 mg/dL — ABNORMAL HIGH (ref 6–23)
CO2: 23 mEq/L (ref 19–32)
Calcium: 9.8 mg/dL (ref 8.4–10.5)
Chloride: 101 mEq/L (ref 96–112)
Creatinine, Ser: 0.93 mg/dL (ref 0.40–1.20)
GFR: 64.21 mL/min (ref >60.00)
Glucose, Bld: 100 mg/dL — ABNORMAL HIGH (ref 70–99)
Potassium: 4.2 mEq/L (ref 3.5–5.1)
Sodium: 134 mEq/L — ABNORMAL LOW (ref 135–145)
Total Bilirubin: 0.4 mg/dL (ref 0.2–1.2)
Total Protein: 7.9 g/dL (ref 6.0–8.3)

## 2016-04-24 ENCOUNTER — Telehealth: Payer: Self-pay | Admitting: Family Medicine

## 2016-04-24 NOTE — Telephone Encounter (Signed)
Patient has been informed that her lab results are ready to be picked up at the front.

## 2016-04-24 NOTE — Telephone Encounter (Signed)
Pt called wanting to get her lab results from 06/2 and 06/19. Pt states she can come and pick it up after 2pm.   Call pt when it's ready @ [phone]. Thank you!

## 2016-04-26 ENCOUNTER — Other Ambulatory Visit: Payer: Self-pay | Admitting: Family Medicine

## 2016-04-26 DIAGNOSIS — R7989 Other specified abnormal findings of blood chemistry: Secondary | ICD-10-CM

## 2016-04-26 DIAGNOSIS — R945 Abnormal results of liver function studies: Principal | ICD-10-CM

## 2016-05-02 ENCOUNTER — Ambulatory Visit: Payer: Medicare Other

## 2016-05-04 ENCOUNTER — Telehealth: Payer: Self-pay | Admitting: Family Medicine

## 2016-05-04 NOTE — Telephone Encounter (Signed)
Pt left a vm wanting to repeat the liver func test in about 2 weeks once she stopped taking the pain medication. Need orders please and thank you.  Call pt @ [phone]. Thank you!

## 2016-05-04 NOTE — Telephone Encounter (Signed)
She states, if you do not think it will change anything then she will not be concerned with repeat of labs.  She took Tylenol last night, but will start day 1 today of no pain medication, if you would like to place the order.  Let me know if she needs to be placed on the lab schedule.

## 2016-05-08 NOTE — Telephone Encounter (Signed)
I think it would be okay to hold off on repeating lab work until after we have ultrasound results. Thanks.

## 2016-05-09 ENCOUNTER — Telehealth: Payer: Self-pay

## 2016-05-09 NOTE — Telephone Encounter (Signed)
Patient returned phone call and stated that she will not be getting the ultra sound, due to her Psychiatrist saying that she doesn't need it.

## 2016-05-09 NOTE — Telephone Encounter (Signed)
Left detailed message, HIPPA compliant.  Encouraged to call the office back if there were any additional questions/concerns.

## 2016-05-21 ENCOUNTER — Encounter: Payer: Self-pay | Admitting: Family Medicine

## 2016-05-21 ENCOUNTER — Ambulatory Visit (INDEPENDENT_AMBULATORY_CARE_PROVIDER_SITE_OTHER): Payer: Medicare Other | Admitting: Family Medicine

## 2016-05-21 ENCOUNTER — Ambulatory Visit (INDEPENDENT_AMBULATORY_CARE_PROVIDER_SITE_OTHER)
Admission: RE | Admit: 2016-05-21 | Discharge: 2016-05-21 | Disposition: A | Discharge status: Home / Self Care | Payer: Medicare Other | Source: Ambulatory Visit | Attending: Family Medicine | Admitting: Family Medicine

## 2016-05-21 VITALS — BP 128/72 | HR 57 | Temp 98.1°F | Ht 67.5 in | Wt 142.5 lb

## 2016-05-21 DIAGNOSIS — M7582 Other shoulder lesions, left shoulder: Secondary | ICD-10-CM | POA: Diagnosis not present

## 2016-05-21 DIAGNOSIS — M25512 Pain in left shoulder: Secondary | ICD-10-CM

## 2016-05-21 DIAGNOSIS — S4992XA Unspecified injury of left shoulder and upper arm, initial encounter: Secondary | ICD-10-CM | POA: Diagnosis not present

## 2016-05-21 DIAGNOSIS — M25522 Pain in left elbow: Secondary | ICD-10-CM

## 2016-05-21 MED ORDER — NONFORMULARY OR COMPOUNDED ITEM: Status: DC

## 2016-05-21 NOTE — Progress Notes (Signed)
Pre visit review using our clinic review tool, if applicable. No additional management support is needed unless otherwise documented below in the visit note. 

## 2016-05-21 NOTE — Progress Notes (Signed)
Dr. Frederico Hamman T. Ahaan Zobrist, MD, [facility] [place], [address] Phone: [phone] Fax: [phone]  05/21/2016  Patient: Amy Mcconnell, MRN: [id number], DOB: 1951/09/16, 65 y.o.  Primary Physician:  Tommi Rumps, MD   Chief Complaint  Patient presents with  . Shoulder Pain    Left  . Elbow Pain    Left   Subjective:   Amy Mcconnell is a 65 y.o. very pleasant female patient who presents with the following:  Pleasant patient who I remember well who I saw last year with a avulsion fracture for calcaneus who presents with a six-week history of left shoulder pain and left elbow pain.  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  Also had a knot.   Initially, she had quite a bit of pain in her left elbow, had pain with flexion as well as pronation and supination. Since that time she has been icing it quite a bit with an ice pack. Now her elbow is quite a bit better, but she still has some pain in the distal humerus laterally. Pain is mostly with flexion and deep.  L shoulder pain. She has pain with terminal internal range of motion and to a lesser extent with abduction. She has some pain with reaching across the body. She also has pain in a T-shirt distribution.  The patient is on lithium and is unable to take NSAIDs.  Past Medical History, Surgical History, Social History, Family History, Problem List, Medications, and Allergies have been reviewed and updated if relevant.  Patient Active Problem List   Diagnosis Date Noted  . Stress incontinence 04/01/2016  . Bradycardia 08/19/2015  . Flank pain 04/23/2015  . Bipolar disorder ([place]) 03/30/2015  . Anxiety   . Essential hypertension 10/06/2014  . Left foot pain 08/20/2014  . Avulsion fracture of calcaneus 08/20/2014  . Chest pain 07/23/2014  . 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 Tachycardia  01/22/2014  . Smoking history 01/22/2014  . Hyperlipidemia 01/22/2014    Past Medical History  Diagnosis Date  . Other specified disorders of thyroid   . Essential hypertension, benign   . Contusion of unspecified part of lower limb   . Other and unspecified hyperlipidemia   . Contact dermatitis and other eczema due to other specified agent   . Reflux esophagitis   . Bipolar disorder ([place])     Dr. Thurmond Butts - every 3 months  . 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 Heart murmur   . Broken foot     left   . Anxiety   . Tachycardia     a. isolated episode, seen in ED with negative work up    Past Surgical History  Procedure Laterality Date  . Cataract extraction    . Salpingectomy Left   . Sterilization      Her decision since dz of bipolar    Social History   Social History  . Marital Status: Divorced    Spouse Name: N/A  . Number of Children: 0  . Years of Education: 14   Occupational History  . Not on file.   Social History Main Topics  . Smoking status: Former Smoker -- 0.50 packs/day for 13 years    Quit date: 01/23/2000  . Smokeless tobacco: Never Used  . Alcohol Use: No  . Drug Use: No  .  Sexual Activity: Not on file   Other Topics Concern  . Not on file   Social History Narrative   Amy Mcconnell grew up in [place], [place]. She lives in [place]. Amy Mcconnell is divorced. She has 2 cats (Charlie and Angie). She volunteers at [facility]. She also volunteers 2 days at [facility]. She enjoys reading, garding and cooking.    Family History  Problem Relation Age of Onset  . Arrhythmia Mother     A-Fib  . Heart failure Mother   . Hyperlipidemia Mother   . Hypertension Mother   . Hypertension Father   . Hyperlipidemia Father   . Mental retardation Father 57    Suicide  . Hypertension Brother   . Hyperlipidemia Brother   . Hypertension Brother   . Hyperlipidemia Brother   . Hyperlipidemia Brother   . Hypertension Brother   .  Alcohol abuse Brother   . Depression Brother   . Hyperlipidemia Brother   . Hypertension Brother   . Breast cancer Maternal Aunt     Allergies  Allergen Reactions  . Ace Inhibitors     Angioedema   . Angiotensin Receptor Blockers     Angioedema   . Benicar [Olmesartan]     Mouth & Lip Swelling, "I swell from the waist down". Throat swelling  . Poison Ivy Extract [Extract Of Poison Ivy]     Blisters  . Triclosan     Medication list reviewed and updated in full in [place].  GEN: No fevers, chills. Nontoxic. Primarily MSK c/o today. MSK: Detailed in the HPI GI: tolerating PO intake without difficulty Neuro: No numbness, parasthesias, or tingling associated. Otherwise the pertinent positives of the ROS are noted above.   Objective:   BP 128/72 mmHg  Pulse 57  Temp(Src) 98.1 F (36.7 C) (Oral)  Ht 5' 7.5" (1.715 m)  Wt 142 lb 8 oz (64.638 kg)  BMI 21.98 kg/m2   GEN: WDWN, NAD, Non-toxic, Alert & Oriented x 3 HEENT: Atraumatic, Normocephalic.  Ears and Nose: No external deformity. EXTR: No clubbing/cyanosis/edema NEURO: Normal gait.  PSYCH: Normally interactive. Conversant. Not depressed or anxious appearing.  Calm demeanor.   Shoulder: L Inspection: No muscle wasting or winging Ecchymosis/edema: neg  AC joint, scapula, clavicle: NT Cervical spine: NT, full ROM Spurling's: neg Abduction: full, 5/5 Flexion: full, 5/5 IR,  lift-off: 5/5  - 30 deg decreased compared to R ER at neutral: full, 5/5 AC crossover and compression: mild pain Neer: mild pos Hawkins: mild pos Drop Test: neg Empty Can: neg Supraspinatus insertion: NT Bicipital groove: NT Speed's: neg Yergason's: neg Sulcus sign: neg Scapular dyskinesis: none C5-T1 intact Sensation intact Grip 5/5   L elbow Ecchymosis or edema: neg ROM: full flexion, extension, pronation, supination Shoulder ROM: Full Flexion: 5/5 - mildly painful with flexion in a hammer curl or pronated  position Extension: 5/5 Supination: 5/5  Pronation: 5/5 Wrist ext: 5/5 Wrist flexion: 5/5 No gross bony abnormality Varus and Valgus stress: stable ECRB tenderness: neg Medial epicondyle: NT Lateral epicondyle, resisted wrist extension from wrist full pronation and flexion: NT TTP at brachialis grip: 5/5  sensation intact Tinel's, Elbow: negative   Radiology: Dg Shoulder Left  05/21/2016  CLINICAL DATA:  Follow-up fall in May of 2017. EXAM: LEFT SHOULDER - 2+ VIEW COMPARISON:  None. FINDINGS: Three views of the left shoulder are provided. Osseous alignment is normal. No fracture line or displaced fracture fragment identified. Overall bone mineralization is normal. No  significant degenerative change at the glenohumeral or acromioclavicular joint spaces. Adjacent soft tissues are unremarkable. IMPRESSION: Negative. Electronically Signed   By: Franki Cabot M.D.   On: 05/21/2016 14:56    Assessment and Plan:   Rotator cuff tendonitis, left  Left shoulder pain - Plan: DG Shoulder Left  Left elbow pain   Secondary to trauma, rotator cuff tendinopathy with probable subacromial bursitis as well. 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  Classic climbers elbow on exam. Brachialis tendinopathy. Rehabilitation reviewed. She was also given rehabilitation for [facility] shoulder protocol.  Physical therapy. Avoid NSAIDs in patient on lithium. More reluctant to use any corticosteroid including injection in bipolar 1 patient.  She would like to see for PT Ms. Gerlean Ren [place], [place] [id number] h [phone] Cell [phone]  Follow-up: if not better by 6 weeks  New Prescriptions   NONFORMULARY OR COMPOUNDED ITEM    Physical Therapy referral Eval and Treat, MOC 1-2 times a week for 4 weeks Dx: rotator cuff tendonitis, brachialis tendinopathy   Orders Placed This Encounter  Procedures  . DG Shoulder  Left    Signed,  Frederico Hamman T. Latica Hohmann, MD   Patient's Medications  New Prescriptions   NONFORMULARY OR COMPOUNDED ITEM    Physical Therapy referral Eval and Treat, MOC 1-2 times a week for 4 weeks Dx: rotator cuff tendonitis, brachialis tendinopathy  Previous Medications   ACETAMINOPHEN (TYLENOL) 500 MG TABLET    Take 1,000 mg by mouth every 6 (six) hours as needed.   AMLODIPINE (NORVASC) 5 MG TABLET    Take 1 tablet (5 mg total) by mouth daily.   ASCORBIC ACID (VITAMIN C) 1000 MG TABLET    Take 1,000 mg by mouth daily.   ASPIRIN 81 MG TABLET    Take 81 mg by mouth daily.   ATORVASTATIN (LIPITOR) 80 MG TABLET    Take 1 tablet (80 mg total) by mouth daily.   B COMPLEX VITAMINS (VITAMIN B COMPLEX PO)    Take by mouth daily.   CALCIUM CARBONATE (OS-CAL) 600 MG TABS TABLET    Take 150 mg by mouth 2 (two) times daily with a meal.    CHOLECALCIFEROL (VITAMIN D PO)    Take 1 tablet by mouth every morning.    DIPHENHYDRAMINE (BENADRYL) 25 MG CAPSULE    Take 25 mg by mouth daily as needed for allergies. For food and fragrance sensitivities   DOXAZOSIN (CARDURA) 8 MG TABLET    Take 1 tablet (8 mg total) by mouth 2 (two) times daily.   EPINEPHRINE 0.3 MG/0.3 ML IJ SOAJ INJECTION    Inject 0.3 mLs (0.3 mg total) into the muscle once.   FEXOFENADINE (ALLEGRA) 180 MG TABLET    Take 180 mg by mouth. 2-3 times daily prn For food and fragrance sensitivities/intolerances   LEVOTHYROXINE (SYNTHROID, LEVOTHROID) 125 MCG TABLET    Take 125 mcg by mouth daily before breakfast.   LITHIUM CARBONATE (ESKALITH) 450 MG CR TABLET    Take by mouth every evening.    MAGNESIUM OXIDE (MAG-OX) 400 MG TABLET    Take 400 mg by mouth daily.   METHOCARBAMOL (ROBAXIN) 500 MG TABLET    Take 1 tablet (500 mg total) by mouth 4 (four) times daily.   MULTIPLE VITAMINS-MINERALS (MULTIVITAMIN WITH MINERALS) TABLET    Take 1 tablet by mouth daily.   MULTIPLE VITAMINS-MINERALS (VISION FORMULA/LUTEIN) TABS    Take by mouth.  OMEGA-3 FATTY ACIDS (FISH OIL) 1000 MG CAPS    Take by mouth 2 (two) times daily.    OXCARBAZEPINE (TRILEPTAL) 300 MG TABLET    Take 300 mg one tablet in the am & two tablets in the pm.   PROPRANOLOL (INDERAL) 20 MG TABLET    Take 1 tablet (20 mg total) by mouth 3 (three) times daily as needed (for tachycardia).   PROPRANOLOL ER (INDERAL LA) 60 MG 24 HR CAPSULE    TAKE ONE CAPSULE BY MOUTH ONCE DAILY   SULFAMETHOXAZOLE-TRIMETHOPRIM (BACTRIM DS,SEPTRA DS) 800-160 MG PER TABLET    Take 1 tablet by mouth 2 (two) times daily.   TDAP (BOOSTRIX) 5-2.5-18.5 LF-MCG/0.5 INJECTION    Inject 0.5 mLs into the muscle once.   VITAMIN B-12 (CYANOCOBALAMIN) 1000 MCG TABLET    Take 1,000 mcg by mouth daily.   VITAMIN E 1000 UNIT CAPSULE    Take 1,000 Units by mouth daily.   ZINC GLUCONATE 50 MG TABLET    Take 50 mg by mouth daily.   ZIPRASIDONE (GEODON) 60 MG CAPSULE    Take 60 mg by mouth daily.  Modified Medications   No medications on file  Discontinued Medications   No medications on file

## 2016-05-23 ENCOUNTER — Other Ambulatory Visit: Payer: Self-pay | Admitting: Cardiovascular Disease

## 2016-06-08 DIAGNOSIS — F3170 Bipolar disorder, currently in remission, most recent episode unspecified: Secondary | ICD-10-CM | POA: Diagnosis not present

## 2016-06-22 ENCOUNTER — Other Ambulatory Visit: Payer: Self-pay | Admitting: Cardiovascular Disease

## 2016-06-29 ENCOUNTER — Other Ambulatory Visit: Payer: Self-pay

## 2016-07-13 DIAGNOSIS — M7989 Other specified soft tissue disorders: Secondary | ICD-10-CM | POA: Diagnosis not present

## 2016-07-13 DIAGNOSIS — M25561 Pain in right knee: Secondary | ICD-10-CM | POA: Diagnosis not present

## 2016-07-19 DIAGNOSIS — Z23 Encounter for immunization: Secondary | ICD-10-CM | POA: Diagnosis not present

## 2016-08-29 ENCOUNTER — Ambulatory Visit (INDEPENDENT_AMBULATORY_CARE_PROVIDER_SITE_OTHER): Payer: Medicare Other | Admitting: Cardiovascular Disease

## 2016-08-29 ENCOUNTER — Encounter: Payer: Self-pay | Admitting: Cardiovascular Disease

## 2016-08-29 VITALS — BP 120/70 | HR 60 | Ht 67.5 in | Wt 142.0 lb

## 2016-08-29 DIAGNOSIS — E7800 Pure hypercholesterolemia, unspecified: Secondary | ICD-10-CM

## 2016-08-29 DIAGNOSIS — R000 Tachycardia, unspecified: Secondary | ICD-10-CM | POA: Diagnosis not present

## 2016-08-29 DIAGNOSIS — Z87891 Personal history of nicotine dependence: Secondary | ICD-10-CM | POA: Diagnosis not present

## 2016-08-29 DIAGNOSIS — I10 Essential (primary) hypertension: Secondary | ICD-10-CM | POA: Diagnosis not present

## 2016-08-29 NOTE — Patient Instructions (Signed)
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
## 2016-09-07 ENCOUNTER — Ambulatory Visit: Payer: Medicare Other

## 2016-09-11 ENCOUNTER — Other Ambulatory Visit: Payer: Self-pay | Admitting: Cardiovascular Disease

## 2016-09-12 DIAGNOSIS — F3170 Bipolar disorder, currently in remission, most recent episode unspecified: Secondary | ICD-10-CM | POA: Diagnosis not present

## 2016-10-16 ENCOUNTER — Other Ambulatory Visit: Payer: Self-pay | Admitting: Cardiovascular Disease

## 2016-10-19 ENCOUNTER — Ambulatory Visit: Payer: Medicare Other

## 2016-10-22 ENCOUNTER — Ambulatory Visit: Payer: Medicare Other

## 2016-10-22 VITALS — BP 118/72 | HR 62 | Temp 98.3°F | Resp 14 | Ht 67.5 in | Wt 142.4 lb

## 2016-10-22 DIAGNOSIS — Z0000 Encounter for general adult medical examination without abnormal findings: Secondary | ICD-10-CM

## 2016-10-22 NOTE — Progress Notes (Signed)
Subjective:   Amy Mcconnell is a 65 y.o. female who presents for an Initial Medicare Annual Wellness Visit.  Review of Systems    No ROS.  Medicare Wellness Visit.  Cardiac Risk Factors include: advanced age (>15men, >68 women);hypertension     Objective:    Today's Vitals   10/22/16 1004  BP: 118/72  Pulse: 62  Resp: 14  Temp: 98.3 F (36.8 C)  TempSrc: Oral  SpO2: 95%  Weight: 142 lb 6.4 oz (64.6 kg)  Height: 5' 7.5" (1.715 m)   Body mass index is 21.97 kg/m.   Current Medications (verified) Outpatient Encounter Prescriptions as of 10/22/2016  Medication Sig  . acetaminophen (TYLENOL) 500 MG tablet Take 1,000 mg by mouth every 6 (six) hours as needed.  Marland Kitchen amLODipine (NORVASC) 5 MG tablet Take 1 tablet (5 mg total) by mouth daily.  . Ascorbic Acid (VITAMIN C) 1000 MG tablet Take 1,000 mg by mouth daily.  Marland Kitchen aspirin 81 MG tablet Take 81 mg by mouth daily.  Marland Kitchen atorvastatin (LIPITOR) 80 MG tablet TAKE ONE TABLET BY MOUTH ONCE DAILY  . B Complex Vitamins (VITAMIN B COMPLEX PO) Take by mouth daily.  . calcium carbonate (OS-CAL) 600 MG TABS tablet Take 600 mg by mouth 2 (two) times daily with a meal.   . Cholecalciferol (VITAMIN D PO) Take 1 tablet by mouth every morning.   . diphenhydrAMINE (BENADRYL) 25 mg capsule Take 25 mg by mouth daily as needed for allergies. For food and fragrance sensitivities  . doxazosin (CARDURA) 8 MG tablet TAKE ONE TABLET BY MOUTH TWICE DAILY  . EPINEPHrine 0.3 mg/0.3 mL IJ SOAJ injection Inject 0.3 mLs (0.3 mg total) into the muscle once.  . fexofenadine (ALLEGRA) 180 MG tablet Take 180 mg by mouth. 2-3 times daily prn For food and fragrance sensitivities/intolerances  . levothyroxine (SYNTHROID, LEVOTHROID) 125 MCG tablet Take 125 mcg by mouth daily before breakfast.  . lithium carbonate (ESKALITH) 450 MG CR tablet Take by mouth every evening.   . magnesium oxide (MAG-OX) 400 MG tablet Take 400 mg by mouth daily.  . Multiple  Vitamins-Minerals (MULTIVITAMIN WITH MINERALS) tablet Take 1 tablet by mouth daily.  . Multiple Vitamins-Minerals (VISION FORMULA/LUTEIN) TABS Take by mouth.  . Omega-3 Fatty Acids (FISH OIL) 1000 MG CAPS Take by mouth 2 (two) times daily.   . Oxcarbazepine (TRILEPTAL) 300 MG tablet Take 300 mg one tablet in the am & two tablets in the pm.  . propranolol (INDERAL) 20 MG tablet Take 1 tablet (20 mg total) by mouth 3 (three) times daily as needed (for tachycardia).  . propranolol ER (INDERAL LA) 60 MG 24 hr capsule TAKE ONE CAPSULE BY MOUTH ONCE DAILY  . Tdap (BOOSTRIX) 5-2.5-18.5 LF-MCG/0.5 injection Inject 0.5 mLs into the muscle once.  . vitamin B-12 (CYANOCOBALAMIN) 1000 MCG tablet Take 1,000 mcg by mouth daily.  . vitamin E 1000 UNIT capsule Take 1,000 Units by mouth daily.  Marland Kitchen zinc gluconate 50 MG tablet Take 50 mg by mouth daily.  . ziprasidone (GEODON) 60 MG capsule Take 60 mg by mouth daily.   No facility-administered encounter medications on file as of 10/22/2016.     Allergies (verified) Ace inhibitors; Angiotensin receptor blockers; Benicar [olmesartan]; Poison ivy extract [poison ivy extract]; and Triclosan   History: Past Medical History:  Diagnosis Date  . 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 Anxiety   . Bipolar disorder ([place])    Dr. Thurmond Butts - every 3 months  .  Broken foot    left   . Contact dermatitis and other eczema due to other specified agent   . Contusion of unspecified part of lower limb   . Essential hypertension, benign   . Heart murmur   . Other and unspecified hyperlipidemia   . Other specified disorders of thyroid   . Reflux esophagitis   . Tachycardia    a. isolated episode, seen in ED with negative work up   Past Surgical History:  Procedure Laterality Date  . CATARACT EXTRACTION    . SALPINGECTOMY Left   . STERILIZATION     Her decision since dz of bipolar   Family History  Problem Relation Age of Onset  . Arrhythmia Mother     A-Fib    . Heart failure Mother   . Hyperlipidemia Mother   . Hypertension Mother   . Hypertension Father   . Hyperlipidemia Father   . Mental retardation Father 43    Suicide  . Hypertension Brother   . Hyperlipidemia Brother   . Hypertension Brother   . Hyperlipidemia Brother   . Hyperlipidemia Brother   . Hypertension Brother   . Alcohol abuse Brother   . Depression Brother   . Hyperlipidemia Brother   . Hypertension Brother   . Breast cancer Maternal Aunt    Social History   Occupational History  . Not on file.   Social History Main Topics  . Smoking status: Former Smoker    Packs/day: 0.50    Years: 13.00    Quit date: 01/23/2000  . Smokeless tobacco: Never Used  . Alcohol use No  . Drug use: No  . Sexual activity: No    Tobacco Counseling Counseling given: Not Answered   Activities of Daily Living In your present state of health, do you have any difficulty performing the following activities: 10/22/2016  Hearing? N  Vision? N  Difficulty concentrating or making decisions? N  Walking or climbing stairs? N  Dressing or bathing? N  Doing errands, shopping? N  Preparing Food and eating ? N  Using the Toilet? N  In the past six months, have you accidently leaked urine? Y  Do you have problems with loss of bowel control? N  Managing your Medications? N  Managing your Finances? N  Housekeeping or managing your Housekeeping? N  Some recent data might be hidden    Immunizations and Health Maintenance Immunization History  Administered Date(s) Administered  . Influenza-Unspecified 08/05/2014, 07/06/2016  . Pneumococcal Conjugate-13 01/23/2016  . Pneumococcal Polysaccharide-23 08/22/2011  . Tdap 12/29/2014  . Zoster 12/29/2013   Health Maintenance Due  Topic Date Due  . PAP SMEAR  12/29/2012    Patient Care Team: Leone Haven, MD as PCP - General (Family Medicine) Minna Merritts, MD as Consulting Physician (Cardiology)  Indicate any recent Medical  Services you may have received from other than [facility] providers in the past year (date may be approximate).     Assessment:   This is a routine wellness examination for [place].  The goal of the wellness visit is to assist the patient how to close the gaps in care and create a preventative care plan for the patient.   Taking calcium VIT D as appropriate/Osteoporosis risk reviewed.  Medications reviewed; taking without issues or barriers.  Safety issues reviewed; lives alone.  Smoke detectors in the home. No firearms in the home. Wears seatbelts when driving or riding with others. No violence in the home.  No identified risk  were noted; The patient was oriented x 3; appropriate in dress and manner and no objective failures at ADL's or IADL's.   BMI; discussed the importance of a healthy diet, water intake and exercise. Educational material provided.  Patient Concerns: None at this time. Follow up with PCP as needed.  Hearing/Vision screen Hearing Screening Comments: Pt passes the whisper test Vision Screening Comments: Followed by [facility] Annual visits Wears glasses Vision acuity was not assessed per patient preference since she has regular follow up with her ophthalmologist.  Dietary issues and exercise activities discussed: Current Exercise Habits: Home exercise routine, Type of exercise: walking;strength training/weights, Time (Minutes): 30, Frequency (Times/Week): 4, Weekly Exercise (Minutes/Week): 120, Intensity: Moderate  Goals    . Healthy Lifestyle          Stay hydrated and drink plenty of fluids/water Maintain exercise regimen of walking/climbing stairs. Silver sneakers program structured weekly class. Add Benefiber to diet to help with constipation      Depression Screen PHQ 2/9 Scores 10/22/2016 01/01/2015  PHQ - 2 Score 0 -  Exception Documentation - Other- indicate reason in comment box  Not completed - managed y Dr Susann Givens, psychiatry    Fall Risk Fall  Risk  10/22/2016 06/29/2016 01/01/2015  Falls in the past year? Yes Yes No  Number falls in past yr: 1 1 -  Injury with Fall? Yes Yes -  Follow up Falls prevention discussed;Education provided - -    Cognitive Function:     6CIT Screen 10/22/2016  What Year? 0 points  What month? 0 points  What time? 0 points  Count back from 20 0 points  Months in reverse 0 points    Screening Tests Health Maintenance  Topic Date Due  . PAP SMEAR  12/29/2012  . HIV Screening  04/10/2017 (Originally 10/27/1966)  . PNA vac Low Risk Adult (2 of 2 - PPSV23) 01/22/2017  . MAMMOGRAM  03/12/2018  . COLONOSCOPY  11/06/2021  . TETANUS/TDAP  12/29/2024  . INFLUENZA VACCINE  Completed  . DEXA SCAN  Addressed  . ZOSTAVAX  Completed  . Hepatitis C Screening  Completed      Plan:    End of life planning; Advance aging; Advanced directives discussed. No HCPOA/Living.  Additional information declined per patient at this time.  Follow up with PCP.  Medicare Attestation I have personally reviewed: The patient's medical and social history Their use of alcohol, tobacco or illicit drugs Their current medications and supplements The patient's functional ability including ADLs,fall risks, home safety risks, cognitive, and hearing and visual impairment Diet and physical activities Evidence for depression   The patient's weight, height, BMI, and visual acuity have been recorded in the chart.  I have made referrals and provided education to the patient based on review of the above and I have provided the patient with a written personalized care plan for preventive services.    During the course of the visit, Arybella was educated and counseled about the following appropriate screening and preventive services:   Vaccines to include Pneumoccal, Influenza, Hepatitis B, Td, Zostavax, HCV  Electrocardiogram  Cardiovascular disease screening  Colorectal cancer screening  Bone density screening  Diabetes  screening  Glaucoma screening  Mammography/PAP  Nutrition counseling  Smoking cessation counseling  Patient Instructions (the written plan) were given to the patient.    Varney Biles, LPN   [phone]

## 2016-10-22 NOTE — Patient Instructions (Addendum)
Amy Mcconnell , Thank you for taking time to come for your Medicare Wellness Visit. I appreciate your ongoing commitment to your health goals. Please review the following plan we discussed and let me know if I can assist you in the future.   Follow up with Dr. Caryl Bis as needed.  These are the goals we discussed: Goals    . Healthy Lifestyle          Stay hydrated and drink plenty of fluids/water Maintain exercise regimen of walking/climbing stairs. Silver sneakers program structured weekly class. Add Benefiber to diet to help with constipation       This is a list of the screening recommended for you and due dates:  Health Maintenance  Topic Date Due  . Pap Smear  12/29/2012  . HIV Screening  04/10/2017*  . Pneumonia vaccines (2 of 2 - PPSV23) 01/22/2017  . Mammogram  03/12/2018  . Colon Cancer Screening  11/06/2021  . Tetanus Vaccine  12/29/2024  . Flu Shot  Completed  . DEXA scan (bone density measurement)  Addressed  . Shingles Vaccine  Completed  .  Hepatitis C: One time screening is recommended by Center for Disease Control  (CDC) for  adults born from [birth year] through 1966.   Completed  *Topic was postponed. The date shown is not the original due date.      Fall Prevention in the Home Introduction Falls can cause injuries. They can happen to people of all ages. There are many things you can do to make your home safe and to help prevent falls. What can I do on the outside of my home?  Regularly fix the edges of walkways and driveways and fix any cracks.  Remove anything that might make you trip as you walk through a door, such as a raised step or threshold.  Trim any bushes or trees on the path to your home.  Use bright outdoor lighting.  Clear any walking paths of anything that might make someone trip, such as rocks or tools.  Regularly check to see if handrails are loose or broken. Make sure that both sides of any steps have handrails.  Any raised decks and  porches should have guardrails on the edges.  Have any leaves, snow, or ice cleared regularly.  Use sand or salt on walking paths during winter.  Clean up any spills in your garage right away. This includes oil or grease spills. What can I do in the bathroom?  Use night lights.  Install grab bars by the toilet and in the tub and shower. Do not use towel bars as grab bars.  Use non-skid mats or decals in the tub or shower.  If you need to sit down in the shower, use a plastic, non-slip stool.  Keep the floor dry. Clean up any water that spills on the floor as soon as it happens.  Remove soap buildup in the tub or shower regularly.  Attach bath mats securely with double-sided non-slip rug tape.  Do not have throw rugs and other things on the floor that can make you trip. What can I do in the bedroom?  Use night lights.  Make sure that you have a light by your bed that is easy to reach.  Do not use any sheets or blankets that are too big for your bed. They should not hang down onto the floor.  Have a firm chair that has side arms. You can use this for support while you get dressed.  Do not have throw rugs and other things on the floor that can make you trip. What can I do in the kitchen?  Clean up any spills right away.  Avoid walking on wet floors.  Keep items that you use a lot in easy-to-reach places.  If you need to reach something above you, use a strong step stool that has a grab bar.  Keep electrical cords out of the way.  Do not use floor polish or wax that makes floors slippery. If you must use wax, use non-skid floor wax.  Do not have throw rugs and other things on the floor that can make you trip. What can I do with my stairs?  Do not leave any items on the stairs.  Make sure that there are handrails on both sides of the stairs and use them. Fix handrails that are broken or loose. Make sure that handrails are as long as the stairways.  Check any  carpeting to make sure that it is firmly attached to the stairs. Fix any carpet that is loose or worn.  Avoid having throw rugs at the top or bottom of the stairs. If you do have throw rugs, attach them to the floor with carpet tape.  Make sure that you have a light switch at the top of the stairs and the bottom of the stairs. If you do not have them, ask someone to add them for you. What else can I do to help prevent falls?  Wear shoes that:  Do not have high heels.  Have rubber bottoms.  Are comfortable and fit you well.  Are closed at the toe. Do not wear sandals.  If you use a stepladder:  Make sure that it is fully opened. Do not climb a closed stepladder.  Make sure that both sides of the stepladder are locked into place.  Ask someone to hold it for you, if possible.  Clearly mark and make sure that you can see:  Any grab bars or handrails.  First and last steps.  Where the edge of each step is.  Use tools that help you move around (mobility aids) if they are needed. These include:  Canes.  Walkers.  Scooters.  Crutches.  Turn on the lights when you go into a dark area. Replace any light bulbs as soon as they burn out.  Set up your furniture so you have a clear path. Avoid moving your furniture around.  If any of your floors are uneven, fix them.  If there are any pets around you, be aware of where they are.  Review your medicines with your doctor. Some medicines can make you feel dizzy. This can increase your chance of falling. Ask your doctor what other things that you can do to help prevent falls. This information is not intended to replace advice given to you by your health care provider. Make sure you discuss any questions you have with your health care provider. Document Released: 08/18/2009 Document Revised: 03/29/2016 Document Reviewed: 11/26/2014  2017 [employer]

## 2016-11-02 NOTE — Progress Notes (Signed)
I have reviewed the above note and agree.  Eric Sonnenberg, M.D.  

## 2016-11-06 ENCOUNTER — Other Ambulatory Visit: Payer: Self-pay | Admitting: Cardiovascular Disease

## 2016-11-14 ENCOUNTER — Telehealth: Payer: Self-pay | Admitting: Cardiovascular Disease

## 2016-11-14 NOTE — Telephone Encounter (Signed)
Spoke with patient and she states that she was filling out some paperwork and it asked if she had coronary artery disease. Reviewed her chart and let her know that I only see hypertension and tachycardia. Let her know that I see a family history but she does not currently have that diagnosis listed. She then wanted to know how to find out if she has it. Let her know that this is based on symptoms and further testing. She was appreciative for the call back and review of her chart. She verbalized understanding of our conversation and had no further questions at this time.

## 2016-11-14 NOTE — Telephone Encounter (Signed)
Pt is filling out paperwork for her [employer], and she asks if she has coronary heart disease. Please call and advise.

## 2016-11-26 ENCOUNTER — Telehealth: Payer: Self-pay | Admitting: Cardiovascular Disease

## 2016-11-26 NOTE — Telephone Encounter (Signed)
Pt states she is having problems with Amlodipine. States yesterday at church, she was at church for 3 hours, and had to RUN to the bathroom 3 times. Would like to know if this medication can be switched. Please call and advise.

## 2016-11-26 NOTE — Telephone Encounter (Signed)
Spoke w/ pt.  She reports since starting amlodipine 5 mg daily, she has had increased urination to the point that she has to run to the restroom so often that it is becoming embarrassing. She reports that it is working well otherwise, her BP has been really good. She asks if she can switch the timing of amlodipine w/ her propranolol, as she would prefer to be up all night in the bathroom rather than while she is active during the day.  Advised her to try this and see how she does.  She will take propranolol in the am and amlodipine in pm for a few days and call back if she is still unable to tolerate.

## 2016-11-29 ENCOUNTER — Telehealth: Payer: Self-pay | Admitting: Family Medicine

## 2016-11-29 NOTE — Telephone Encounter (Signed)
Pt has been authorized to see Dr. Lanetta Inch (psychiatrist) through her [facility]. Valid from 11/29/16-11/04/17- 15 visits. Authorization # [id number] Per Colletta [place] @ [employer] they will fax over the authorization to Dr. Reuel Derby office. It will take 7-10 business days

## 2016-12-12 DIAGNOSIS — F3170 Bipolar disorder, currently in remission, most recent episode unspecified: Secondary | ICD-10-CM | POA: Diagnosis not present

## 2017-01-18 ENCOUNTER — Telehealth: Payer: Self-pay | Admitting: Cardiovascular Disease

## 2017-01-18 NOTE — Telephone Encounter (Signed)
Pt calling stating stating she's been reading a lot about Doxazosin  And she's read that it dangerous to take, that is can cause death  She read a lot on this would like a call back on this Would like some options for this medication  Please advise

## 2017-01-18 NOTE — Telephone Encounter (Signed)
Pt has been on Cardura since 11/30/14. She has been reading that it is dangerous to take and would like an alternative med sent it.

## 2017-01-20 NOTE — Telephone Encounter (Signed)
I am not aware of her doxazosin concerns, we do not see any issues other than low blood pressure is dose is too high She does not have a lot of medication options give allergies or intolerances She could stop cardura and increase amlodipine up to 10 mg daily

## 2017-01-21 NOTE — Telephone Encounter (Signed)
Spoke w/ pt.  She reports that she read a report that doxazosin was "1 of 2 BP meds that you should avoid b/c it can cause sudden death". Advised her of Dr. Donivan Scull recommendation.  She states that her BP has been under good control on her current regimen and she will continue for now.  She will continue to research and call back if she finds any other studies on cardura.

## 2017-01-21 NOTE — Telephone Encounter (Signed)
Left message for pt to call back  °

## 2017-01-28 DIAGNOSIS — Z79899 Other long term (current) drug therapy: Secondary | ICD-10-CM | POA: Diagnosis not present

## 2017-02-06 ENCOUNTER — Encounter: Payer: Self-pay | Admitting: Family Medicine

## 2017-02-06 ENCOUNTER — Ambulatory Visit (INDEPENDENT_AMBULATORY_CARE_PROVIDER_SITE_OTHER): Payer: BC Managed Care – PPO | Admitting: Family Medicine

## 2017-02-06 DIAGNOSIS — A084 Viral intestinal infection, unspecified: Secondary | ICD-10-CM | POA: Diagnosis not present

## 2017-02-06 NOTE — Patient Instructions (Signed)
Nice to see you. I suspect you had a viral illness. You should try to remain well hydrated. You can advance your diet slowly from bland foods. If you have recurrence of vomiting or diarrhea or you develop abdominal pain or blood in her stool or vomit please seek medical attention immediately.

## 2017-02-06 NOTE — Progress Notes (Signed)
  Tommi Rumps, MD Phone: [phone]  Amy Mcconnell is a 66 y.o. female who presents today for same-day visit.  Patient notes onset of symptoms 5 days ago. Woke up with chills and fevers and body aches and also had mild headache on top of her head. Notes she then developed vomiting that was nonbloody. Also had diarrhea up to 12 times per day. Notes she has progressively gotten better over the last 1-2 days. No headaches since Friday. No numbness or weakness or vision changes with the headaches. No abdominal pain with this. No blood in her stool. Had a more formed stool today. Feels significantly better.  ROS see history of present illness  Objective  Physical Exam Vitals:   02/06/17 0816  BP: 102/70  Pulse: 64  Temp: 98.2 F (36.8 C)    BP Readings from Last 3 Encounters:  02/06/17 102/70  10/22/16 118/72  08/29/16 120/70   Wt Readings from Last 3 Encounters:  02/06/17 145 lb (65.8 kg)  10/22/16 142 lb 6.4 oz (64.6 kg)  08/29/16 142 lb (64.4 kg)    Physical Exam  Constitutional: No distress.  HENT:  Head: Normocephalic and atraumatic.  Mouth/Throat: Oropharynx is clear and moist. No oropharyngeal exudate.  Eyes: Conjunctivae are normal. Pupils are equal, round, and reactive to light.  Cardiovascular: Normal rate, regular rhythm and normal heart sounds.   Pulmonary/Chest: Effort normal and breath sounds normal.  Abdominal: Soft. Bowel sounds are normal. She exhibits no distension. There is no tenderness. There is no rebound and no guarding.  Musculoskeletal: She exhibits no edema.  Neurological: She is alert. Gait normal.  Skin: Skin is warm and dry. She is not diaphoretic.     Assessment/Plan: Please see individual problem list.  Viral gastroenteritis Symptoms most consistent with viral gastroenteritis. Patient has a benign exam today. She has been improving over the last 1-2 days. Stools are more formed. Discussed staying well hydrated and eating bland foods.  Slowly advancing her diet. She'll monitor for recurrence of her symptoms.    Tommi Rumps, MD [place]

## 2017-02-06 NOTE — Assessment & Plan Note (Addendum)
Symptoms most consistent with viral gastroenteritis. Patient has a benign exam today. She has been improving over the last 1-2 days. Stools are more formed. Discussed staying well hydrated and eating bland foods. Slowly advancing her diet. She'll monitor for recurrence of her symptoms.

## 2017-02-06 NOTE — Progress Notes (Signed)
Pre visit review using our clinic review tool, if applicable. No additional management support is needed unless otherwise documented below in the visit note. 

## 2017-02-19 ENCOUNTER — Other Ambulatory Visit: Payer: Self-pay | Admitting: Cardiovascular Disease

## 2017-02-22 DIAGNOSIS — L298 Other pruritus: Secondary | ICD-10-CM | POA: Diagnosis not present

## 2017-02-22 DIAGNOSIS — D2339 Other benign neoplasm of skin of other parts of face: Secondary | ICD-10-CM | POA: Diagnosis not present

## 2017-02-22 DIAGNOSIS — L820 Inflamed seborrheic keratosis: Secondary | ICD-10-CM | POA: Diagnosis not present

## 2017-02-27 ENCOUNTER — Other Ambulatory Visit: Payer: Self-pay | Admitting: Family Medicine

## 2017-02-27 ENCOUNTER — Other Ambulatory Visit: Payer: Self-pay | Admitting: Internal Medicine

## 2017-02-27 DIAGNOSIS — Z1231 Encounter for screening mammogram for malignant neoplasm of breast: Secondary | ICD-10-CM

## 2017-03-11 DIAGNOSIS — F3170 Bipolar disorder, currently in remission, most recent episode unspecified: Secondary | ICD-10-CM | POA: Diagnosis not present

## 2017-03-19 ENCOUNTER — Ambulatory Visit
Admission: RE | Admit: 2017-03-19 | Discharge: 2017-03-19 | Disposition: A | Discharge status: Home / Self Care | Payer: Medicare HMO | Source: Ambulatory Visit | Attending: Family Medicine | Admitting: Family Medicine

## 2017-03-19 DIAGNOSIS — Z1231 Encounter for screening mammogram for malignant neoplasm of breast: Secondary | ICD-10-CM | POA: Diagnosis not present

## 2017-04-24 DIAGNOSIS — F3170 Bipolar disorder, currently in remission, most recent episode unspecified: Secondary | ICD-10-CM | POA: Diagnosis not present

## 2017-05-31 ENCOUNTER — Telehealth: Payer: Self-pay | Admitting: Gastroenterology

## 2017-05-31 NOTE — Telephone Encounter (Signed)
Patient needs to set up a colonoscopy with Dr. Allen Norris

## 2017-06-03 ENCOUNTER — Telehealth: Payer: Self-pay

## 2017-06-03 ENCOUNTER — Other Ambulatory Visit: Payer: Self-pay

## 2017-06-03 DIAGNOSIS — Z79899 Other long term (current) drug therapy: Secondary | ICD-10-CM | POA: Diagnosis not present

## 2017-06-03 DIAGNOSIS — Z1211 Encounter for screening for malignant neoplasm of colon: Secondary | ICD-10-CM

## 2017-06-03 NOTE — Telephone Encounter (Signed)
Gastroenterology Pre-Procedure Review  Request Date: 06/28/17 Requesting Physician: Dr. Allen Norris  PATIENT REVIEW QUESTIONS: The patient responded to the following health history questions as indicated:    1. Are you having any GI issues? no 2. Do you have a personal history of Polyps? no 3. Do you have a family history of Colon Cancer or Polyps? no 4. Diabetes Mellitus? no 5. Joint replacements in the past 12 months?no 6. Major health problems in the past 3 months?no 7. Any artificial heart valves, MVP, or defibrillator?no    MEDICATIONS & ALLERGIES:    Patient reports the following regarding taking any anticoagulation/antiplatelet therapy:   Plavix, Coumadin, Eliquis, Xarelto, Lovenox, Pradaxa, Brilinta, or Effient? no Aspirin? yes (81 mg daily)  Patient confirms/reports the following medications:  Current Outpatient Prescriptions  Medication Sig Dispense Refill  . acetaminophen (TYLENOL) 500 MG tablet Take 1,000 mg by mouth every 6 (six) hours as needed.    Marland Kitchen amLODipine (NORVASC) 5 MG tablet TAKE ONE TABLET BY MOUTH ONCE DAILY 90 tablet 3  . Ascorbic Acid (VITAMIN C) 1000 MG tablet Take 1,000 mg by mouth daily.    Marland Kitchen aspirin 81 MG tablet Take 81 mg by mouth daily.    Marland Kitchen atorvastatin (LIPITOR) 80 MG tablet TAKE ONE TABLET BY MOUTH ONCE DAILY 90 tablet 3  . B Complex Vitamins (VITAMIN B COMPLEX PO) Take by mouth daily.    . calcium carbonate (OS-CAL) 600 MG TABS tablet Take 600 mg by mouth 2 (two) times daily with a meal.     . Cholecalciferol (VITAMIN D PO) Take 1 tablet by mouth every morning.     . diphenhydrAMINE (BENADRYL) 25 mg capsule Take 25 mg by mouth daily as needed for allergies. For food and fragrance sensitivities    . doxazosin (CARDURA) 8 MG tablet TAKE ONE TABLET BY MOUTH TWICE DAILY 60 tablet 3  . EPINEPHrine 0.3 mg/0.3 mL IJ SOAJ injection Inject 0.3 mLs (0.3 mg total) into the muscle once. 2 Device 0  . fexofenadine (ALLEGRA) 180 MG tablet Take 180 mg by mouth. 2-3  times daily prn For food and fragrance sensitivities/intolerances    . levothyroxine (SYNTHROID, LEVOTHROID) 125 MCG tablet Take 125 mcg by mouth daily before breakfast.    . lithium carbonate (ESKALITH) 450 MG CR tablet Take by mouth every evening.     . magnesium oxide (MAG-OX) 400 MG tablet Take 400 mg by mouth daily.    . Multiple Vitamins-Minerals (MULTIVITAMIN WITH MINERALS) tablet Take 1 tablet by mouth daily.    . Multiple Vitamins-Minerals (VISION FORMULA/LUTEIN) TABS Take by mouth.    . Omega-3 Fatty Acids (FISH OIL) 1000 MG CAPS Take by mouth 2 (two) times daily.     . Oxcarbazepine (TRILEPTAL) 300 MG tablet Take 300 mg one tablet in the am & two tablets in the pm.    . propranolol (INDERAL) 20 MG tablet Take 1 tablet (20 mg total) by mouth 3 (three) times daily as needed (for tachycardia). 90 tablet 3  . propranolol ER (INDERAL LA) 60 MG 24 hr capsule TAKE ONE CAPSULE BY MOUTH ONCE DAILY 30 capsule 3  . Tdap (BOOSTRIX) 5-2.5-18.5 LF-MCG/0.5 injection Inject 0.5 mLs into the muscle once. 0.5 mL 0  . vitamin B-12 (CYANOCOBALAMIN) 1000 MCG tablet Take 1,000 mcg by mouth daily.    . vitamin E 1000 UNIT capsule Take 1,000 Units by mouth daily.    Marland Kitchen zinc gluconate 50 MG tablet Take 50 mg by mouth daily.    Marland Kitchen  ziprasidone (GEODON) 60 MG capsule Take 60 mg by mouth daily.     No current facility-administered medications for this visit.     Patient confirms/reports the following allergies:  Allergies  Allergen Reactions  . Ace Inhibitors     Angioedema   . Angiotensin Receptor Blockers     Angioedema   . Benicar [Olmesartan]     Mouth & Lip Swelling, "I swell from the waist down". Throat swelling  . Poison Ivy Extract [Poison Ivy Extract]     Blisters  . Triclosan     No orders of the defined types were placed in this encounter.   AUTHORIZATION INFORMATION Primary Insurance: 1D#: Group #:  Secondary Insurance: 1D#: Group #:  SCHEDULE INFORMATION: Date:  06/28/17 Time: Location:[facility]

## 2017-06-03 NOTE — Telephone Encounter (Signed)
Pts call returned to schedule colonoscopy but she was at [employer] and will call back today to schedule.

## 2017-06-10 DIAGNOSIS — F3170 Bipolar disorder, currently in remission, most recent episode unspecified: Secondary | ICD-10-CM | POA: Diagnosis not present

## 2017-06-24 ENCOUNTER — Telehealth: Payer: Self-pay | Admitting: Family Medicine

## 2017-06-24 ENCOUNTER — Telehealth: Payer: Self-pay | Admitting: Gastroenterology

## 2017-06-24 NOTE — Telephone Encounter (Signed)
Patient called back and wants to do the cologuard since she is allergic to so many things. She would like to talk to you about where she gets this. Please call today

## 2017-06-24 NOTE — Telephone Encounter (Signed)
I called patient to let her know that she would need to contact her PCP for the cologuard per [facility]

## 2017-06-24 NOTE — Telephone Encounter (Signed)
Patient left a voice message to cancel her procedure. She is taking a lot of antihistamines and is afraid of being put under and not willing to stop antihistamines. I called [facility]

## 2017-06-24 NOTE — Telephone Encounter (Signed)
Please clarify not sure what this is about

## 2017-06-24 NOTE — Telephone Encounter (Signed)
Pt has allergies and is afraid to have done. She would like to have the mail in test. Please call her at [phone].

## 2017-06-25 NOTE — Telephone Encounter (Signed)
Please advise on cologuard

## 2017-06-25 NOTE — Telephone Encounter (Signed)
I am sorry, pt doesn't want a colonoscopy.

## 2017-06-26 ENCOUNTER — Other Ambulatory Visit: Payer: Self-pay | Admitting: Cardiovascular Disease

## 2017-06-26 NOTE — Telephone Encounter (Signed)
Patient states she spoke to her GI doctor and they informed her that if the cologuard is positive she would have to have a diagnostic colonoscopy anyway, she does not want to do either one at this time

## 2017-06-26 NOTE — Telephone Encounter (Signed)
Please find out if the patient has a history of colonoscopy in the past. Please see if she's ever had any polyps or if she has a family history of colon cancer in an immediate relative. Please also see if she's passing any blood in her stool. If she answers no to these questions we can do a cologuard.

## 2017-06-27 NOTE — Telephone Encounter (Signed)
Noted  

## 2017-06-28 ENCOUNTER — Ambulatory Visit
Admission: RE | Admit: 2017-06-28 | Payer: BC Managed Care – PPO | Source: Ambulatory Visit | Admitting: Gastroenterology

## 2017-06-28 ENCOUNTER — Encounter: Admission: RE | Payer: Self-pay | Source: Ambulatory Visit

## 2017-06-28 SURGERY — COLONOSCOPY WITH PROPOFOL
Anesthesia: Choice
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
## 2017-08-23 ENCOUNTER — Ambulatory Visit (INDEPENDENT_AMBULATORY_CARE_PROVIDER_SITE_OTHER): Payer: Medicare HMO | Admitting: Cardiovascular Disease

## 2017-08-23 ENCOUNTER — Encounter: Payer: Self-pay | Admitting: Cardiovascular Disease

## 2017-08-23 VITALS — BP 120/72 | HR 48 | Ht 67.5 in | Wt 142.2 lb

## 2017-08-23 DIAGNOSIS — T7819XS Other adverse food reactions, not elsewhere classified, sequela: Secondary | ICD-10-CM

## 2017-08-23 DIAGNOSIS — R000 Tachycardia, unspecified: Secondary | ICD-10-CM | POA: Diagnosis not present

## 2017-08-23 DIAGNOSIS — F3170 Bipolar disorder, currently in remission, most recent episode unspecified: Secondary | ICD-10-CM

## 2017-08-23 DIAGNOSIS — E7800 Pure hypercholesterolemia, unspecified: Secondary | ICD-10-CM

## 2017-08-23 DIAGNOSIS — T781XXS Other adverse food reactions, not elsewhere classified, sequela: Secondary | ICD-10-CM | POA: Diagnosis not present

## 2017-08-23 DIAGNOSIS — Z87891 Personal history of nicotine dependence: Secondary | ICD-10-CM

## 2017-08-23 DIAGNOSIS — I10 Essential (primary) hypertension: Secondary | ICD-10-CM

## 2017-08-23 MED ORDER — PROPRANOLOL HCL ER 60 MG PO CP24: 60.0000 mg | ORAL_CAPSULE | Freq: Every day | ORAL | 3 refills | Status: DC

## 2017-08-23 NOTE — Patient Instructions (Signed)

## 2017-09-04 ENCOUNTER — Other Ambulatory Visit: Payer: Self-pay | Admitting: Cardiovascular Disease

## 2017-09-09 DIAGNOSIS — F3170 Bipolar disorder, currently in remission, most recent episode unspecified: Secondary | ICD-10-CM | POA: Diagnosis not present

## 2017-09-11 DIAGNOSIS — Z79899 Other long term (current) drug therapy: Secondary | ICD-10-CM | POA: Diagnosis not present

## 2017-10-07 DIAGNOSIS — F329 Major depressive disorder, single episode, unspecified: Secondary | ICD-10-CM | POA: Diagnosis not present

## 2017-10-18 ENCOUNTER — Other Ambulatory Visit: Payer: Self-pay | Admitting: Cardiovascular Disease

## 2017-10-21 DIAGNOSIS — F3170 Bipolar disorder, currently in remission, most recent episode unspecified: Secondary | ICD-10-CM | POA: Diagnosis not present

## 2017-10-22 ENCOUNTER — Ambulatory Visit: Payer: Medicare Other

## 2017-10-24 ENCOUNTER — Telehealth: Payer: Self-pay | Admitting: Cardiovascular Disease

## 2017-10-24 MED ORDER — DOXAZOSIN MESYLATE 8 MG PO TABS: 8.0000 mg | ORAL_TABLET | Freq: Two times a day (BID) | ORAL | 3 refills | Status: DC

## 2017-10-24 NOTE — Telephone Encounter (Signed)
°*  STAT* If patient is at the pharmacy, call can be transferred to refill team.   1. Which medications need to be refilled? (please list name of each medication and dose if known) Doxazosin   2. Which pharmacy/location (including street and city if local pharmacy) is medication to be sent to?[facility] [address]   3. Do they need a 30 day or 90 day supply? 90 day

## 2017-10-24 NOTE — Telephone Encounter (Signed)
Refill sent for 90 day supply for Doxazosin 8 mg.

## 2017-11-04 ENCOUNTER — Ambulatory Visit: Payer: BC Managed Care – PPO

## 2017-11-06 ENCOUNTER — Ambulatory Visit (INDEPENDENT_AMBULATORY_CARE_PROVIDER_SITE_OTHER): Payer: Medicare HMO

## 2017-11-06 VITALS — BP 102/68 | HR 64 | Temp 98.2°F | Resp 14 | Ht 67.0 in

## 2017-11-06 DIAGNOSIS — Z0000 Encounter for general adult medical examination without abnormal findings: Secondary | ICD-10-CM | POA: Diagnosis not present

## 2017-11-06 NOTE — Progress Notes (Signed)
Subjective:   Amy Mcconnell is a 67 y.o. female who presents for an Initial Medicare Annual Wellness Visit.  Review of Systems    No ROS.  Medicare Wellness Visit. Additional risk factors are reflected in the social history.  Cardiac Risk Factors include: advanced age (>74men, >38 women);hypertension     Objective:    Today's Vitals   11/06/17 1544  BP: 102/68  Pulse: 64  Resp: 14  Temp: 98.2 F (36.8 C)  TempSrc: Oral  SpO2: 96%  Height: 5\' 7"  (1.702 m)   Body mass index is 22.28 kg/m.  Advanced Directives 11/06/2017 10/22/2016 04/08/2015  Does Patient Have a Medical Advance Directive? No No No  Would patient like information on creating a medical advance directive? Yes (MAU/Ambulatory/Procedural Areas - Information given) No - Patient declined Yes - Educational materials given    Current Medications (verified) Outpatient Encounter Medications as of 11/06/2017  Medication Sig  . acetaminophen (TYLENOL) 500 MG tablet Take 1,000 mg by mouth every 6 (six) hours as needed.  Marland Kitchen amLODipine (NORVASC) 5 MG tablet TAKE ONE TABLET BY MOUTH ONCE DAILY  . Ascorbic Acid (VITAMIN C) 1000 MG tablet Take 1,000 mg by mouth daily.  Marland Kitchen aspirin 81 MG tablet Take 81 mg by mouth daily.  Marland Kitchen atorvastatin (LIPITOR) 80 MG tablet TAKE ONE TABLET BY MOUTH ONCE DAILY  . B Complex Vitamins (VITAMIN B COMPLEX PO) Take by mouth daily.  . calcium carbonate (OS-CAL) 600 MG TABS tablet Take 600 mg by mouth 2 (two) times daily with a meal.   . Cholecalciferol (VITAMIN D PO) Take 1 tablet by mouth every morning.   . diphenhydrAMINE (BENADRYL) 25 mg capsule Take 25 mg by mouth daily as needed for allergies. For food and fragrance sensitivities  . doxazosin (CARDURA) 8 MG tablet Take 1 tablet (8 mg total) by mouth 2 (two) times daily.  Marland Kitchen EPINEPHrine 0.3 mg/0.3 mL IJ SOAJ injection Inject 0.3 mLs (0.3 mg total) into the muscle once.  . fexofenadine (ALLEGRA) 180 MG tablet Take 180 mg by mouth. 2-3 times daily  prn For food and fragrance sensitivities/intolerances  . levothyroxine (SYNTHROID, LEVOTHROID) 125 MCG tablet Take 125 mcg by mouth daily before breakfast.  . lithium carbonate 150 MG capsule Take 150 mg by mouth 2 (two) times daily with a meal.  . magnesium oxide (MAG-OX) 400 MG tablet Take 400 mg by mouth daily.  . Multiple Vitamins-Minerals (MULTIVITAMIN WITH MINERALS) tablet Take 1 tablet by mouth daily.  . Multiple Vitamins-Minerals (VISION FORMULA/LUTEIN) TABS Take by mouth.  . Omega-3 Fatty Acids (FISH OIL) 1000 MG CAPS Take by mouth 2 (two) times daily.   . Oxcarbazepine (TRILEPTAL) 300 MG tablet Take 300 mg one tablet in the am & two tablets in the pm.  . propranolol (INDERAL) 20 MG tablet Take 1 tablet (20 mg total) by mouth 3 (three) times daily as needed (for tachycardia).  . propranolol ER (INDERAL LA) 60 MG 24 hr capsule Take 1 capsule (60 mg total) by mouth daily.  . Tdap (BOOSTRIX) 5-2.5-18.5 LF-MCG/0.5 injection Inject 0.5 mLs into the muscle once.  . vitamin B-12 (CYANOCOBALAMIN) 1000 MCG tablet Take 1,000 mcg by mouth daily.  . vitamin E 1000 UNIT capsule Take 1,000 Units by mouth daily.  Marland Kitchen zinc gluconate 50 MG tablet Take 50 mg by mouth daily.  . ziprasidone (GEODON) 60 MG capsule Take 60 mg by mouth daily.  . [DISCONTINUED] lithium carbonate (ESKALITH) 450 MG CR tablet Take by mouth every  evening.    No facility-administered encounter medications on file as of 11/06/2017.     Allergies (verified) Ace inhibitors; Angiotensin receptor blockers; Benicar [olmesartan]; Poison ivy extract [poison ivy extract]; Purell instant hand [alcohol]; and Triclosan   History: Past Medical History:  Diagnosis Date  . 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 Anxiety   . Bipolar disorder ([place])    Dr. Thurmond Mcconnell - every 3 months  . Broken foot    left   . Contact dermatitis and other eczema due to other specified agent   . Contusion of unspecified part of lower limb   . Essential  hypertension, benign   . Heart murmur   . Other and unspecified hyperlipidemia   . Other specified disorders of thyroid   . Reflux esophagitis   . Tachycardia    a. isolated episode, seen in ED with negative work up   Past Surgical History:  Procedure Laterality Date  . CATARACT EXTRACTION    . SALPINGECTOMY Left   . STERILIZATION     Her decision since dz of bipolar   Family History  Problem Relation Age of Onset  . Arrhythmia Mother        A-Fib  . Heart failure Mother   . Hyperlipidemia Mother   . Hypertension Mother   . Alzheimer's disease Mother   . Hypertension Father   . Hyperlipidemia Father   . Mental retardation Father 68       Suicide  . Hypertension Brother   . Hyperlipidemia Brother   . Hypertension Brother   . Hyperlipidemia Brother   . Hyperlipidemia Brother   . Hypertension Brother   . Alcohol abuse Brother   . Depression Brother   . Hyperlipidemia Brother   . Hypertension Brother   . Breast cancer Maternal Aunt    Social History   Socioeconomic History  . Marital status: Divorced    Spouse name: None  . Number of children: 0  . Years of education: 91  . Highest education level: None  Social Needs  . Financial resource strain: None  . Food insecurity - worry: None  . Food insecurity - inability: None  . Transportation needs - medical: None  . Transportation needs - non-medical: None  Occupational History  . None  Tobacco Use  . Smoking status: Former Smoker    Packs/day: 0.50    Years: 13.00    Pack years: 6.50    Last attempt to quit: 01/23/2000    Years since quitting: 17.8  . Smokeless tobacco: Never Used  Substance and Sexual Activity  . Alcohol use: No  . Drug use: No  . Sexual activity: No  Other Topics Concern  . None  Social History Narrative   Amy Mcconnell grew up in [place], [place]. She lives in [place]. Amy Mcconnell is divorced. She has 2 cats (Amy Mcconnell and Amy Mcconnell). She volunteers at [employer]. She  also volunteers 2 days at [employer]. She enjoys reading, garding and cooking.    Tobacco Counseling Counseling given: Not Answered   Clinical Intake:  Pre-visit preparation completed: Yes  Pain : No/denies pain     Nutritional Status: BMI of 19-24  Normal Diabetes: No  How often do you need to have someone help you when you read instructions, pamphlets, or other written materials from your doctor or pharmacy?: 2 - Rarely  Interpreter Needed?: No      Activities of Daily Living In your present state of health, do  you have any difficulty performing the following activities: 11/06/2017  Hearing? N  Vision? N  Difficulty concentrating or making decisions? N  Walking or climbing stairs? N  Dressing or bathing? N  Doing errands, shopping? N  Preparing Food and eating ? N  Using the Toilet? N  In the past six months, have you accidently leaked urine? N  Do you have problems with loss of bowel control? N  Managing your Medications? N  Managing your Finances? Y  Comment Brother Engineer, mining? N  Some recent data might be hidden     Immunizations and Health Maintenance Immunization History  Administered Date(s) Administered  . Influenza-Unspecified 08/05/2014, 07/06/2016  . Pneumococcal Conjugate-13 01/23/2016  . Pneumococcal Polysaccharide-23 08/22/2011  . Tdap 12/29/2014  . Zoster 12/29/2013   Health Maintenance Due  Topic Date Due  . PNA vac Low Risk Adult (2 of 2 - PPSV23) 01/22/2017    Patient Care Team: Leone Haven, MD as PCP - General (Family Medicine) Rockey Situ Kathlene November, MD as Consulting Physician (Cardiology)  Indicate any recent Medical Services you may have received from other than [facility] providers in the past year (date may be approximate).     Assessment:   This is a routine wellness examination for [place]. The goal of the wellness visit is to assist the patient how to close the gaps in care and create a  preventative care plan for the patient.   The roster of all physicians providing medical care to patient is listed in the Snapshot section of the chart.  Taking calcium VIT D as appropriate/Osteoporosis risk reviewed.    Safety issues reviewed; Smoke and carbon monoxide detectors in the home. No firearms in the home.  Wears seatbelts when driving or riding with others. Patient does wear sunscreen or protective clothing when in direct sunlight. No violence in the home.  Patient is alert, normal appearance, oriented to person/place/and time.  Correctly identified the president of the [place], recall of 3/3 words, and performing simple calculations. Displays appropriate judgement and can read correct time from watch face.   No new identified risk were noted.  No failures at ADL's or IADL's.   BMI- discussed the importance of a healthy diet, water intake and the benefits of aerobic exercise. Educational material provided.   24 hour diet recall: Low fiber diet Breakfast: pimento cheese spread Lunch: [place] sandwich Dinner: lasagna Snack: pumpkin pie  Daily fluid intake: 2 cups of caffeine, 4 cups of water, 1 cup of milk  Dental- every 6 months.  Dr. [facility].  Eye- Visual acuity not assessed per patient preference since they have regular follow up with the ophthalmologist.  Wears corrective lenses.  Sleep patterns- Sleeps 8-9 hours at night.  Wakes feeling rested.  Wears a mouth guard.   Pneumovax 23 vaccine discussed; deferred per patient request.  She plans to have it administered at her local pharmacy.  Patient Concerns: None at this time. Follow up with PCP as needed.  Hearing/Vision screen Hearing Screening Comments: Patient is able to hear conversational tones without difficulty.  No issues reported.   Vision Screening Comments: Followed by [facility] Wears corrective lenses Annual visits Cataract extraction, R eye Visual acuity not assessed per patient preference  since they have regular follow up with the ophthalmologist  Dietary issues and exercise activities discussed: Current Exercise Habits: Home exercise routine, Time (Minutes): 25, Frequency (Times/Week): 2, Weekly Exercise (Minutes/Week): 50, Intensity: Mild  Goals    .  DIET - INCREASE LEAN PROTEINS     Maintain weight      Depression Screen PHQ 2/9 Scores 11/06/2017 10/22/2016 01/01/2015  PHQ - 2 Score 0 0 -  Exception Documentation - - Other- indicate reason in comment box  Not completed - - managed y Dr Susann Givens, psychiatry    Fall Risk Fall Risk  11/06/2017 10/22/2016 06/29/2016 01/01/2015  Falls in the past year? No Yes Yes No  Comment - - Emmi Telephone Survey: data to providers prior to load -  Number falls in past yr: - 1 1 -  Comment - - Emmi Telephone Survey Actual Response = 1 -  Injury with Fall? - Yes Yes -  Comment - Stable and followed by PCP - -  Follow up - Falls prevention discussed;Education provided - -    Cognitive Function: MMSE - Mini Mental State Exam 11/06/2017  Orientation to time 5  Orientation to Place 5  Registration 3  Attention/ Calculation 5  Recall 3  Language- name 2 objects 2  Language- repeat 1  Language- follow 3 step command 3  Language- read & follow direction 1  Write a sentence 1  Copy design 1  Total score 30     6CIT Screen 10/22/2016  What Year? 0 points  What month? 0 points  What time? 0 points  Count back from 20 0 points  Months in reverse 0 points    Screening Tests Health Maintenance  Topic Date Due  . PNA vac Low Risk Adult (2 of 2 - PPSV23) 01/22/2017  . MAMMOGRAM  03/20/2019  . COLONOSCOPY  11/06/2021  . TETANUS/TDAP  12/29/2024  . INFLUENZA VACCINE  Completed  . DEXA SCAN  Completed  . Hepatitis C Screening  Completed      Plan:   End of life planning; Advanced aging; Advanced directives discussed.  No HCPOA/Living Will.  Additional information provided to help them start the conversation with family.  Copy of  HCPOA/Living Will requested upon completion. Time spent on this topic is 18 minutes.  I have personally reviewed and noted the following in the patient's chart:   . Medical and social history . Use of alcohol, tobacco or illicit drugs  . Current medications and supplements . Functional ability and status . Nutritional status . Physical activity . Advanced directives . List of other physicians . Hospitalizations, surgeries, and ER visits in previous 12 months . Vitals . Screenings to include cognitive, depression, and falls . Referrals and appointments  In addition, I have reviewed and discussed with patient certain preventive protocols, quality metrics, and best practice recommendations. A written personalized care plan for preventive services as well as general preventive health recommendations were provided to patient.     Varney Biles, LPN   11/12/8414

## 2017-11-06 NOTE — Patient Instructions (Addendum)
  Amy Mcconnell , Thank you for taking time to come for your Medicare Wellness Visit. I appreciate your ongoing commitment to your health goals. Please review the following plan we discussed and let me know if I can assist you in the future.   Follow up with Dr. Caryl Bis as needed.    Bring a copy of your [place] and/or Living Will to be scanned into chart once completed.  Have a great day!  These are the goals we discussed: Goals    . DIET - INCREASE LEAN PROTEINS     Maintain weight       This is a list of the screening recommended for you and due dates:  Health Maintenance  Topic Date Due  . Pneumonia vaccines (2 of 2 - PPSV23) 01/22/2017  . Mammogram  03/20/2019  . Colon Cancer Screening  11/06/2021  . Tetanus Vaccine  12/29/2024  . Flu Shot  Completed  . DEXA scan (bone density measurement)  Completed  .  Hepatitis C: One time screening is recommended by Center for Disease Control  (CDC) for  adults born from [birth year] through 1966.   Completed

## 2017-11-10 ENCOUNTER — Other Ambulatory Visit: Payer: Self-pay | Admitting: Cardiovascular Disease

## 2017-11-21 ENCOUNTER — Telehealth: Payer: Self-pay | Admitting: Cardiovascular Disease

## 2017-11-21 NOTE — Telephone Encounter (Signed)
Pt c/o of Chest Pain: STAT if CP now or developed within 24 hours  1. Are you having CP right now?  No   2. Are you experiencing any other symptoms (ex. SOB, nausea, vomiting, sweating)? Sob has cold and cough   3. How long have you been experiencing CP? Tuesday night in bed and Wednesday morning   4. Is your CP continuous or coming and going? Comes and goes   5. Have you taken Nitroglycerin? No but took asa on Tuesday night and this helped patient says she has done this several times  ?

## 2017-11-21 NOTE — Telephone Encounter (Signed)
I spoke with the patient. She states she had a sore throat on Tuesday so she stayed home from work. That night, she developed chest presssure lasting > 10 minutes- she took an aspirin at that time and did have relief of her symptoms. She developed a cough/ cold symptoms on Wednesday. She was working at the [employer] yesterday and developed chest pressure again- at the time she was moving fast and lifting- she reports no change in symptoms with movement.  She had chest pressure again this morning for about 10 minutes, but was pushing wheelchairs.  She does not have a history of CAD and no additional symptoms. She states she has had similar symptoms before and reported these to Dr. Rockey Situ- he advised her at that time to monitor symptoms. I advised her that I am unsure if her symptoms are related to the current cold/ congestion that she has, but will forward to Dr. Rockey Situ to review. She is aware we will call her back with recommendations and is agreeable.

## 2017-11-22 DIAGNOSIS — Z79899 Other long term (current) drug therapy: Secondary | ICD-10-CM | POA: Diagnosis not present

## 2017-11-25 NOTE — Telephone Encounter (Signed)
Most likely sx form cold and cough, pulled muscles/musculoskeletal Unable to exclude cardiac etiology, But would refrain from heavy activity until she has recovered If still having sx following recovery from URI, could do a stress test

## 2017-11-26 NOTE — Telephone Encounter (Signed)
Spoke with patient and she reports no discomfort other than cold and cough symptoms. Reviewed Dr. Donivan Scull recommendations with her and she strongly feels this was due to her cold. She verbalized understanding to let us know if symptoms persist after she recovers from her illness. She was very appreciative for the call and had no further questions at this time.

## 2017-11-29 DIAGNOSIS — F3170 Bipolar disorder, currently in remission, most recent episode unspecified: Secondary | ICD-10-CM | POA: Diagnosis not present

## 2018-03-10 ENCOUNTER — Telehealth: Payer: Self-pay | Admitting: Cardiovascular Disease

## 2018-03-10 NOTE — Telephone Encounter (Signed)
Spoke with patient and she states that she went to pick up her medications and she now has 204 pills of the doxazosin. She states that at her last appointment she was to decrease something and didn't realize until now that she had extra pills left over. She reports that she takes cardura 8 mg once daily at bedtime since her last visit and her blood pressures have been fine with no problems. Advised that I would document and when she comes in for next visit we can correct it. She verbalized understanding and was appreciative for the call with no further questions.

## 2018-03-10 NOTE — Telephone Encounter (Signed)
Please call regarding Doxazosine.

## 2018-03-11 ENCOUNTER — Other Ambulatory Visit: Payer: Self-pay | Admitting: Family Medicine

## 2018-03-11 DIAGNOSIS — Z1231 Encounter for screening mammogram for malignant neoplasm of breast: Secondary | ICD-10-CM

## 2018-03-17 DIAGNOSIS — Z79899 Other long term (current) drug therapy: Secondary | ICD-10-CM | POA: Diagnosis not present

## 2018-03-19 DIAGNOSIS — F3170 Bipolar disorder, currently in remission, most recent episode unspecified: Secondary | ICD-10-CM | POA: Diagnosis not present

## 2018-03-27 ENCOUNTER — Ambulatory Visit
Admission: RE | Admit: 2018-03-27 | Discharge: 2018-03-27 | Disposition: A | Discharge status: Home / Self Care | Payer: Medicare HMO | Source: Ambulatory Visit | Attending: Family Medicine | Admitting: Family Medicine

## 2018-03-27 DIAGNOSIS — Z1231 Encounter for screening mammogram for malignant neoplasm of breast: Secondary | ICD-10-CM | POA: Diagnosis not present

## 2018-06-18 DIAGNOSIS — F3170 Bipolar disorder, currently in remission, most recent episode unspecified: Secondary | ICD-10-CM | POA: Diagnosis not present

## 2018-07-25 DIAGNOSIS — H524 Presbyopia: Secondary | ICD-10-CM | POA: Diagnosis not present

## 2018-08-17 ENCOUNTER — Other Ambulatory Visit: Payer: Self-pay | Admitting: Cardiovascular Disease

## 2018-08-27 DIAGNOSIS — N898 Other specified noninflammatory disorders of vagina: Secondary | ICD-10-CM | POA: Diagnosis not present

## 2018-08-31 ENCOUNTER — Other Ambulatory Visit: Payer: Self-pay | Admitting: Cardiovascular Disease

## 2018-09-17 DIAGNOSIS — F3111 Bipolar disorder, current episode manic without psychotic features, mild: Secondary | ICD-10-CM | POA: Diagnosis not present

## 2018-09-19 DIAGNOSIS — Z79899 Other long term (current) drug therapy: Secondary | ICD-10-CM | POA: Diagnosis not present

## 2018-10-06 ENCOUNTER — Ambulatory Visit: Payer: Self-pay

## 2018-10-06 NOTE — Telephone Encounter (Signed)
Pt c/o intermittent chest pain for the past year. Pt stated she had 2 episodes. Pt stated that she had an episode Saturday while [place] the beach. Pt stated that she had another episode yesterday while laying on the couch.  Pt stated that the chest pain is located over her left breast. Pt denies and radiation to her arm, jaw or back, no sweating. Pt denies dizziness, nausea, vomiting, sweating, fever, difficulty breathing or cough.  Pt stated that each episode lasted 5 minutes. Pt describes the chest pain as "pressure."  Care advice given to pt and pt verbalized understanding. Offered to get pt an appointment, per pt's schedule she could come  Wednesday at 3:00 pm with Dr McLean-Scocuzza. .  Reason for Disposition . [1] Chest pain lasts > 5 minutes AND [2] occurred > 3 days ago (72 hours) AND [3] NO chest pain or cardiac symptoms now    Pt has had intermittent chest pain for the past year.  Answer Assessment - Initial Assessment Questions 1. LOCATION: "Where does it hurt?"       Above left breast 2. RADIATION: "Does the pain go anywhere else?" (e.g., into neck, jaw, arms, back)     no 3. ONSET: "When did the chest pain begin?" (Minutes, hours or days)      Over a year- 1 Sat walking on the beach- yesterday laying on cough 4. PATTERN "Does the pain come and go, or has it been constant since it started?"  "Does it get worse with exertion?"      Comes and goes sometimes can go for months without happening 5. DURATION: "How long does it last" (e.g., seconds, minutes, hours)     5 minutes 6. SEVERITY: "How bad is the pain?"  (e.g., Scale 1-10; mild, moderate, or severe)    - MILD (1-3): doesn't interfere with normal activities     - MODERATE (4-7): interferes with normal activities or awakens from sleep    - SEVERE (8-10): excruciating pain, unable to do any normal activities       Mild to moderate- pressure 7. CARDIAC RISK FACTORS: "Do you have any history of heart problems or risk factors  for heart disease?" (e.g., prior heart attack, angina; high blood pressure, diabetes, being overweight, high cholesterol, smoking, or strong family history of heart disease)     HTN,  High cholesterol, family h/o high cholesterol and HTN, maternal side with heart attacks 8. PULMONARY RISK FACTORS: "Do you have any history of lung disease?"  (e.g., blood clots in lung, asthma, emphysema, birth control pills)     no 9. CAUSE: "What do you think is causing the chest pain?"     Pt doesn't know 10. OTHER SYMPTOMS: "Do you have any other symptoms?" (e.g., dizziness, nausea, vomiting, sweating, fever, difficulty breathing, cough)       No symptoms  11. PREGNANCY: "Is there any chance you are pregnant?" "When was your last menstrual period?"     n/a  Protocols used: CHEST PAIN-A-AH

## 2018-10-08 ENCOUNTER — Encounter: Payer: Self-pay | Admitting: Internal Medicine

## 2018-10-08 ENCOUNTER — Ambulatory Visit (INDEPENDENT_AMBULATORY_CARE_PROVIDER_SITE_OTHER): Payer: Medicare HMO

## 2018-10-08 ENCOUNTER — Ambulatory Visit (INDEPENDENT_AMBULATORY_CARE_PROVIDER_SITE_OTHER): Payer: Medicare HMO | Admitting: Internal Medicine

## 2018-10-08 VITALS — BP 126/68 | HR 60 | Temp 98.0°F | Ht 67.0 in | Wt 143.8 lb

## 2018-10-08 DIAGNOSIS — Z1389 Encounter for screening for other disorder: Secondary | ICD-10-CM | POA: Diagnosis not present

## 2018-10-08 DIAGNOSIS — R001 Bradycardia, unspecified: Secondary | ICD-10-CM

## 2018-10-08 DIAGNOSIS — E559 Vitamin D deficiency, unspecified: Secondary | ICD-10-CM | POA: Diagnosis not present

## 2018-10-08 DIAGNOSIS — R079 Chest pain, unspecified: Secondary | ICD-10-CM

## 2018-10-08 DIAGNOSIS — Z13220 Encounter for screening for lipoid disorders: Secondary | ICD-10-CM | POA: Diagnosis not present

## 2018-10-08 DIAGNOSIS — Z0000 Encounter for general adult medical examination without abnormal findings: Secondary | ICD-10-CM

## 2018-10-08 NOTE — Progress Notes (Signed)
Chief Complaint  Patient presents with  . Pain    internittent chest pain   Chest pain  1. Left sided noted 10/04/18 pressure w/o radiation, pain is not stabbing. She has not been sweating, no nausea, no sob. Pain came on with [place] and lasted 5 minutes no meds tried appt with cardiology upcoming. Denies GERD sx's.   Review of Systems  Constitutional: Negative for weight loss.  HENT: Negative for hearing loss.   Eyes: Negative for blurred vision.  Respiratory: Negative for shortness of breath.   Cardiovascular: Positive for chest pain.  Gastrointestinal: Negative for nausea.  Skin: Negative for rash.   Past Medical History:  Diagnosis Date  . 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 Anxiety   . Bipolar disorder ([place])    Dr. Thurmond Butts - every 3 months  . Broken foot    left   . Contact dermatitis and other eczema due to other specified agent   . Contusion of unspecified part of lower limb   . Essential hypertension, benign   . Heart murmur   . Other and unspecified hyperlipidemia   . Other specified disorders of thyroid   . Reflux esophagitis   . Tachycardia    a. isolated episode, seen in ED with negative work up   Past Surgical History:  Procedure Laterality Date  . CATARACT EXTRACTION    . SALPINGECTOMY Left   . STERILIZATION     Her decision since dz of bipolar   Family History  Problem Relation Age of Onset  . Arrhythmia Mother        A-Fib  . Heart failure Mother   . Hyperlipidemia Mother   . Hypertension Mother   . Alzheimer's disease Mother   . Hypertension Father   . Hyperlipidemia Father   . Mental retardation Father 27       Suicide  . Hypertension Brother   . Hyperlipidemia Brother   . Hypertension Brother   . Hyperlipidemia Brother   . Hyperlipidemia Brother   . Hypertension Brother   . Alcohol abuse Brother   . Depression Brother   . Hyperlipidemia Brother   . Hypertension Brother   . Breast cancer  Maternal Aunt    Social History   Socioeconomic History  . Marital status: Divorced    Spouse name: Not on file  . Number of children: 0  . Years of education: 55  . Highest education level: Not on file  Occupational History  . Not on file  Social Needs  . Financial resource strain: Not on file  . Food insecurity:    Worry: Not on file    Inability: Not on file  . Transportation needs:    Medical: Not on file    Non-medical: Not on file  Tobacco Use  . Smoking status: Former Smoker    Packs/day: 0.50    Years: 13.00    Pack years: 6.50    Last attempt to quit: 01/23/2000    Years since quitting: 18.7  . Smokeless tobacco: Never Used  Substance and Sexual Activity  . Alcohol use: No  . Drug use: No  . Sexual activity: Never  Lifestyle  . Physical activity:    Days per week: Not on file    Minutes per session: Not on file  . Stress: Not on file  Relationships  . Social connections:    Talks on phone: Not on file    Gets together:  Not on file    Attends religious service: Not on file    Active member of club or organization: Not on file    Attends meetings of clubs or organizations: Not on file    Relationship status: Not on file  . Intimate partner violence:    Fear of current or ex partner: Not on file    Emotionally abused: Not on file    Physically abused: Not on file    Forced sexual activity: Not on file  Other Topics Concern  . Not on file  Social History Narrative   Ms. Renninger grew up in [place], [place]. She lives in [place]. Ms. Pundt is divorced. She has 2 cats (Charlie and Angie). 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 She also volunteers 2 days at [facility]. She enjoys reading, garding and cooking.   Current Meds  Medication Sig  . acetaminophen (TYLENOL) 500 MG tablet Take 1,000 mg by mouth every 6 (six) hours as needed.  Marland Kitchen amLODipine (NORVASC) 5 MG tablet TAKE ONE TABLET BY MOUTH ONCE DAILY  . Ascorbic Acid (VITAMIN C) 1000 MG tablet  Take 1,000 mg by mouth daily.  Marland Kitchen aspirin 81 MG tablet Take 81 mg by mouth daily.  Marland Kitchen atorvastatin (LIPITOR) 80 MG tablet TAKE 1 TABLET BY MOUTH ONCE DAILY  . atorvastatin (LIPITOR) 80 MG tablet TAKE 1 TABLET BY MOUTH ONCE DAILY  . B Complex Vitamins (VITAMIN B COMPLEX PO) Take by mouth daily.  . calcium carbonate (OS-CAL) 600 MG TABS tablet Take 600 mg by mouth 2 (two) times daily with a meal.   . Cholecalciferol (VITAMIN D PO) Take 1 tablet by mouth every morning.   . diphenhydrAMINE (BENADRYL) 25 mg capsule Take 25 mg by mouth daily as needed for allergies. For food and fragrance sensitivities  . doxazosin (CARDURA) 8 MG tablet Take 1 tablet (8 mg total) by mouth 2 (two) times daily. (Patient taking differently: Take 8 mg by mouth at bedtime. )  . EPINEPHrine 0.3 mg/0.3 mL IJ SOAJ injection Inject 0.3 mLs (0.3 mg total) into the muscle once.  . fexofenadine (ALLEGRA) 180 MG tablet Take 180 mg by mouth. 2-3 times daily prn For food and fragrance sensitivities/intolerances  . Flaxseed, Linseed, (FLAXSEED OIL PO) Take by mouth.  . levothyroxine (SYNTHROID, LEVOTHROID) 125 MCG tablet Take 125 mcg by mouth daily before breakfast.  . lithium carbonate 150 MG capsule Take 150 mg by mouth 2 (two) times daily with a meal.  . magnesium oxide (MAG-OX) 400 MG tablet Take 400 mg by mouth daily.  . Multiple Vitamins-Minerals (MULTIVITAMIN WITH MINERALS) tablet Take 1 tablet by mouth daily.  . Multiple Vitamins-Minerals (VISION FORMULA/LUTEIN) TABS Take by mouth.  . Omega-3 Fatty Acids (FISH OIL) 1000 MG CAPS Take by mouth 2 (two) times daily.   . Oxcarbazepine (TRILEPTAL) 300 MG tablet Take 300 mg one tablet in the am & two tablets in the pm.  . propranolol ER (INDERAL LA) 60 MG 24 hr capsule Take 1 capsule (60 mg total) by mouth daily.  . Tdap (BOOSTRIX) 5-2.5-18.5 LF-MCG/0.5 injection Inject 0.5 mLs into the muscle once.  . vitamin B-12 (CYANOCOBALAMIN) 1000 MCG tablet Take 1,000 mcg by mouth daily.  .  vitamin E 1000 UNIT capsule Take 1,000 Units by mouth daily.  Marland Kitchen zinc gluconate 50 MG tablet Take 50 mg by mouth daily.  . ziprasidone (GEODON) 60 MG capsule Take 60 mg by mouth daily.  . [DISCONTINUED] propranolol (INDERAL) 20 MG tablet Take  1 tablet (20 mg total) by mouth 3 (three) times daily as needed (for tachycardia).   Allergies  Allergen Reactions  . Ace Inhibitors     Angioedema   . Angiotensin Receptor Blockers     Angioedema   . Benicar [Olmesartan]     Mouth & Lip Swelling, "I swell from the waist down". Throat swelling  . Poison Ivy Extract [Poison Ivy Extract]     Blisters  . Purell Instant Hand [Alcohol] Swelling  . Triclosan    No results found for this or any previous visit (from the past 2160 hour(s)). Objective  Body mass index is 22.52 kg/m. Wt Readings from Last 3 Encounters:  10/08/18 143 lb 12.8 oz (65.2 kg)  08/23/17 142 lb 4 oz (64.5 kg)  02/06/17 145 lb (65.8 kg)   Temp Readings from Last 3 Encounters:  10/08/18 98 F (36.7 C) (Oral)  11/06/17 98.2 F (36.8 C) (Oral)  02/06/17 98.2 F (36.8 C) (Oral)   BP Readings from Last 3 Encounters:  10/08/18 126/68  11/06/17 102/68  08/23/17 120/72   Pulse Readings from Last 3 Encounters:  10/08/18 60  11/06/17 64  08/23/17 (!) 48    Physical Exam  Constitutional: She is oriented to person, place, and time. Vital signs are normal. She appears well-developed and well-nourished. She is cooperative.  HENT:  Head: Normocephalic and atraumatic.  Mouth/Throat: Oropharynx is clear and moist and mucous membranes are normal.  Eyes: Pupils are equal, round, and reactive to light. Conjunctivae are normal.  Cardiovascular: Normal rate, regular rhythm and normal heart sounds.  Pulmonary/Chest: Effort normal and breath sounds normal.  Neurological: She is alert and oriented to person, place, and time. Gait normal.  Skin: Skin is warm, dry and intact.  Psychiatric: She has a normal mood and affect. Her speech  is normal and behavior is normal. Judgment and thought content normal. Cognition and memory are normal.  Nursing note and vitals reviewed.   Assessment   1. Nonspecific left sided chest pain EKG with SB no acute ST/T changes, not reproducible or MSK Plan   1. EKG, CXR today  rec Dr. Rockey Situ consider stress test at appt upcoming to further w/u  Requested recent labs [facility] today  Fasting labs Friday cbc, lipid,UA, vit D per pt had CMET, TSH, lithium level with psych [facility] [address] will get records  Pt declines trial of PPI for possible GERD if sx's not better and cards w/u neg consider GI referral for EGD   Provider: Dr. Olivia Mackie McLean-[facility]ocuz[facility]

## 2018-10-08 NOTE — Progress Notes (Signed)
Pre visit review using our clinic review tool, if applicable. No additional management support is needed unless otherwise documented below in the visit note. 

## 2018-10-08 NOTE — Patient Instructions (Signed)
Nonspecific Chest Pain °Chest pain can be caused by many different conditions. There is always a chance that your pain could be related to something serious, such as a heart attack or a blood clot in your lungs. Chest pain can also be caused by conditions that are not life-threatening. If you have chest pain, it is very important to follow up with your health care provider. °What are the causes? °Causes of this condition include: °· Heartburn. °· Pneumonia or bronchitis. °· Anxiety or stress. °· Inflammation around your heart (pericarditis) or lung (pleuritis or pleurisy). °· A blood clot in your lung. °· A collapsed lung (pneumothorax). This can develop suddenly on its own (spontaneous pneumothorax) or from trauma to the chest. °· Shingles infection (varicella-zoster virus). °· Heart attack. °· Damage to the bones, muscles, and cartilage that make up your chest wall. This can include: °? Bruised bones due to injury. °? Strained muscles or cartilage due to frequent or repeated coughing or overwork. °? Fracture to one or more ribs. °? Sore cartilage due to inflammation (costochondritis). ° °What increases the risk? °Risk factors for this condition may include: °· Activities that increase your risk for trauma or injury to your chest. °· Respiratory infections or conditions that cause frequent coughing. °· Medical conditions or overeating that can cause heartburn. °· Heart disease or family history of heart disease. °· Conditions or health behaviors that increase your risk of developing a blood clot. °· Having had chicken pox (varicella zoster). ° °What are the signs or symptoms? °Chest pain can feel like: °· Burning or tingling on the surface of your chest or deep in your chest. °· Crushing, pressure, aching, or squeezing pain. °· Dull or sharp pain that is worse when you move, cough, or take a deep breath. °· Pain that is also felt in your back, neck, shoulder, or arm, or pain that spreads to any of these  areas. ° °Your chest pain may come and go, or it may stay constant. °How is this diagnosed? °Lab tests or other studies may be needed to find the cause of your pain. Your health care provider may have you take a test called an ECG (electrocardiogram). An ECG records your heartbeat patterns at the time the test is performed. You may also have other tests, such as: °· Transthoracic echocardiogram (TTE). In this test, sound waves are used to create a picture of the heart structures and to look at how blood flows through your heart. °· Transesophageal echocardiogram (TEE). This is a more advanced imaging test that takes images from inside your body. It allows your health care provider to see your heart in finer detail. °· Cardiac monitoring. This allows your health care provider to monitor your heart rate and rhythm in real time. °· Holter monitor. This is a portable device that records your heartbeat and can help to diagnose abnormal heartbeats. It allows your health care provider to track your heart activity for several days, if needed. °· Stress tests. These can be done through exercise or by taking medicine that makes your heart beat more quickly. °· Blood tests. °· Other imaging tests. ° °How is this treated? °Treatment depends on what is causing your chest pain. Treatment may include: °· Medicines. These may include: °? Acid blockers for heartburn. °? Anti-inflammatory medicine. °? Pain medicine for inflammatory conditions. °? Antibiotic medicine, if an infection is present. °? Medicines to dissolve blood clots. °? Medicines to treat coronary artery disease (CAD). °· Supportive care for conditions that   do not require medicines. This may include: °? Resting. °? Applying heat or cold packs to injured areas. °? Limiting activities until pain decreases. ° °Follow these instructions at home: °Medicines °· If you were prescribed an antibiotic, take it as told by your health care provider. Do not stop taking the  antibiotic even if you start to feel better. °· Take over-the-counter and prescription medicines only as told by your health care provider. °Lifestyle °· Do not use any products that contain nicotine or tobacco, such as cigarettes and e-cigarettes. If you need help quitting, ask your health care provider. °· Do not drink alcohol. °· Make lifestyle changes as directed by your health care provider. These may include: °? Getting regular exercise. Ask your health care provider to suggest some activities that are safe for you. °? Eating a heart-healthy diet. A registered dietitian can help you to learn healthy eating options. °? Maintaining a healthy weight. °? Managing diabetes, if necessary. °? Reducing stress, such as with yoga or relaxation techniques. °General instructions °· Avoid any activities that bring on chest pain. °· If heartburn is the cause for your chest pain, raise (elevate) the head of your bed about 6 inches (15 cm) by putting blocks under the legs. Sleeping with more pillows does not effectively relieve heartburn because it only changes the position of your head. °· Keep all follow-up visits as told by your health care provider. This is important. This includes any further testing if your chest pain does not go away. °Contact a health care provider if: °· Your chest pain does not go away. °· You have a rash with blisters on your chest. °· You have a fever. °· You have chills. °Get help right away if: °· Your chest pain is worse. °· You have a cough that gets worse, or you cough up blood. °· You have severe pain in your abdomen. °· You have severe weakness. °· You faint. °· You have sudden, unexplained chest discomfort. °· You have sudden, unexplained discomfort in your arms, back, neck, or jaw. °· You have shortness of breath at any time. °· You suddenly start to sweat, or your skin gets clammy. °· You feel nauseous or you vomit. °· You suddenly feel light-headed or dizzy. °· Your heart begins to beat  quickly, or it feels like it is skipping beats. °These symptoms may represent a serious problem that is an emergency. Do not wait to see if the symptoms will go away. Get medical help right away. Call your local emergency services (911 in the [place] Do not drive yourself to the hospital. °This information is not intended to replace advice given to you by your health care provider. Make sure you discuss any questions you have with your health care provider. °Document Released: 08/01/2005 Document Revised: 07/16/2016 Document Reviewed: 07/16/2016 °Elsevier Interactive Patient Education © 2017 [employer] Inc[employer] ° °

## 2018-10-10 ENCOUNTER — Other Ambulatory Visit (INDEPENDENT_AMBULATORY_CARE_PROVIDER_SITE_OTHER): Payer: Medicare HMO

## 2018-10-10 DIAGNOSIS — Z1389 Encounter for screening for other disorder: Secondary | ICD-10-CM

## 2018-10-10 DIAGNOSIS — R079 Chest pain, unspecified: Secondary | ICD-10-CM | POA: Diagnosis not present

## 2018-10-10 DIAGNOSIS — Z13220 Encounter for screening for lipoid disorders: Secondary | ICD-10-CM | POA: Diagnosis not present

## 2018-10-10 DIAGNOSIS — Z0000 Encounter for general adult medical examination without abnormal findings: Secondary | ICD-10-CM

## 2018-10-10 DIAGNOSIS — E559 Vitamin D deficiency, unspecified: Secondary | ICD-10-CM | POA: Diagnosis not present

## 2018-10-10 LAB — VITAMIN D 25 HYDROXY (VIT D DEFICIENCY, FRACTURES): VITD: 36.69 ng/mL (ref 30.00–100.00)

## 2018-10-10 LAB — CBC WITH DIFFERENTIAL/PLATELET
BASOS ABS: 0 10*3/uL (ref 0.0–0.1)
Basophils Relative: 0.8 % (ref 0.0–3.0)
EOS ABS: 0.1 10*3/uL (ref 0.0–0.7)
Eosinophils Relative: 2.1 % (ref 0.0–5.0)
HCT: 39.3 % (ref 36.0–46.0)
HEMOGLOBIN: 12.9 g/dL (ref 12.0–15.0)
LYMPHS PCT: 36.6 % (ref 12.0–46.0)
Lymphs Abs: 1.6 10*3/uL (ref 0.7–4.0)
MCHC: 32.9 g/dL (ref 30.0–36.0)
MCV: 86.4 fl (ref 78.0–100.0)
Monocytes Absolute: 0.4 10*3/uL (ref 0.1–1.0)
Monocytes Relative: 10.5 % (ref 3.0–12.0)
NEUTROS ABS: 2.1 10*3/uL (ref 1.4–7.7)
Neutrophils Relative %: 50 % (ref 43.0–77.0)
Platelets: 151 10*3/uL (ref 150.0–400.0)
RBC: 4.55 Mil/uL (ref 3.87–5.11)
RDW: 14.5 % (ref 11.5–15.5)
WBC: 4.2 10*3/uL (ref 4.0–10.5)

## 2018-10-10 LAB — LIPID PANEL
Cholesterol: 159 mg/dL (ref 0–200)
HDL: 72.3 mg/dL (ref >39.00)
LDL CALC: 75 mg/dL (ref 0–99)
NONHDL: 86.8
TRIGLYCERIDES: 61 mg/dL (ref 0.0–149.0)
Total CHOL/HDL Ratio: 2
VLDL: 12.2 mg/dL (ref 0.0–40.0)

## 2018-10-10 NOTE — Addendum Note (Signed)
Addended by: Arby Barrette on: 10/10/2018 10:11 AM   Modules accepted: Orders

## 2018-10-11 LAB — MICROSCOPIC EXAMINATION: Casts: NONE SEEN /lpf

## 2018-10-11 LAB — URINALYSIS, ROUTINE W REFLEX MICROSCOPIC
Bilirubin, UA: NEGATIVE
GLUCOSE, UA: NEGATIVE
Ketones, UA: NEGATIVE
Nitrite, UA: NEGATIVE
PROTEIN UA: NEGATIVE
RBC, UA: NEGATIVE
Specific Gravity, UA: 1.01 (ref 1.005–1.030)
UUROB: 0.2 mg/dL (ref 0.2–1.0)
pH, UA: 6.5 (ref 5.0–7.5)
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
## 2018-10-14 ENCOUNTER — Ambulatory Visit (INDEPENDENT_AMBULATORY_CARE_PROVIDER_SITE_OTHER): Payer: Medicare HMO | Admitting: Cardiovascular Disease

## 2018-10-14 ENCOUNTER — Encounter: Payer: Self-pay | Admitting: Cardiovascular Disease

## 2018-10-14 VITALS — BP 140/84 | HR 67 | Ht 67.5 in | Wt 144.0 lb

## 2018-10-14 DIAGNOSIS — E7800 Pure hypercholesterolemia, unspecified: Secondary | ICD-10-CM

## 2018-10-14 DIAGNOSIS — R079 Chest pain, unspecified: Secondary | ICD-10-CM | POA: Diagnosis not present

## 2018-10-14 DIAGNOSIS — Z87891 Personal history of nicotine dependence: Secondary | ICD-10-CM

## 2018-10-14 DIAGNOSIS — I479 Paroxysmal tachycardia, unspecified: Secondary | ICD-10-CM | POA: Diagnosis not present

## 2018-10-14 DIAGNOSIS — I10 Essential (primary) hypertension: Secondary | ICD-10-CM | POA: Diagnosis not present

## 2018-10-14 NOTE — Patient Instructions (Signed)

## 2018-10-17 ENCOUNTER — Other Ambulatory Visit: Payer: Self-pay | Admitting: Cardiovascular Disease

## 2018-10-30 ENCOUNTER — Other Ambulatory Visit: Payer: Self-pay | Admitting: Cardiovascular Disease

## 2018-11-07 ENCOUNTER — Ambulatory Visit (INDEPENDENT_AMBULATORY_CARE_PROVIDER_SITE_OTHER): Payer: Medicare HMO | Admitting: Family Medicine

## 2018-11-07 ENCOUNTER — Ambulatory Visit (INDEPENDENT_AMBULATORY_CARE_PROVIDER_SITE_OTHER): Payer: Medicare HMO

## 2018-11-07 ENCOUNTER — Ambulatory Visit: Payer: BC Managed Care – PPO

## 2018-11-07 VITALS — BP 110/64 | HR 60 | Temp 97.7°F | Ht 67.0 in | Wt 144.2 lb

## 2018-11-07 VITALS — BP 110/64 | HR 60 | Temp 97.7°F | Resp 15 | Ht 67.0 in | Wt 144.4 lb

## 2018-11-07 DIAGNOSIS — R300 Dysuria: Secondary | ICD-10-CM | POA: Diagnosis not present

## 2018-11-07 DIAGNOSIS — Z0000 Encounter for general adult medical examination without abnormal findings: Secondary | ICD-10-CM

## 2018-11-07 LAB — POCT URINALYSIS DIPSTICK
Bilirubin, UA: NEGATIVE
Glucose, UA: NEGATIVE
Ketones, UA: NEGATIVE
Nitrite, UA: NEGATIVE
Protein, UA: NEGATIVE
Spec Grav, UA: 1.01 (ref 1.010–1.025)
Urobilinogen, UA: 0.2 E.U./dL
pH, UA: 7 (ref 5.0–8.0)

## 2018-11-07 MED ORDER — CEPHALEXIN 500 MG PO CAPS: 500.0000 mg | ORAL_CAPSULE | Freq: Two times a day (BID) | ORAL | 0 refills | Status: DC

## 2018-11-07 NOTE — Progress Notes (Signed)
  Tommi Rumps, MD Phone: [phone]  Amy Mcconnell is a 68 y.o. female who presents today for same-day visit.  CC: Dysuria  Patient notes onset of dysuria over the last several days.  Notes it occurs intermittently.  She does note frequency though she does drink a lot of water.  She does note some urgency as well.  No hematuria, fevers, or vaginal discharge.  No abdominal pain.  Social History   Tobacco Use  Smoking Status Former Smoker  . Packs/day: 0.50  . Years: 13.00  . Pack years: 6.50  . Last attempt to quit: 01/23/2000  . Years since quitting: 18.8  Smokeless Tobacco Never Used     ROS see history of present illness  Objective  Physical Exam Vitals:   11/07/18 1537  BP: 110/64  Pulse: 60  Temp: 97.7 F (36.5 C)  SpO2: 97%    BP Readings from Last 3 Encounters:  11/07/18 110/64  11/07/18 110/64  10/14/18 140/84   Wt Readings from Last 3 Encounters:  11/07/18 144 lb 4 oz (65.4 kg)  11/07/18 144 lb 6.4 oz (65.5 kg)  10/14/18 144 lb (65.3 kg)    Physical Exam Constitutional:      General: She is not in acute distress.    Appearance: She is not diaphoretic.  Cardiovascular:     Rate and Rhythm: Normal rate and regular rhythm.     Heart sounds: Normal heart sounds.  Pulmonary:     Effort: Pulmonary effort is normal.  Abdominal:     General: Bowel sounds are normal. There is no distension.     Palpations: Abdomen is soft.     Tenderness: There is no abdominal tenderness.  Skin:    General: Skin is warm and dry.  Neurological:     Mental Status: She is alert.      Assessment/Plan: Please see individual problem list.  Dysuria Concerning for UTI.  We will treat with Keflex.  Send urine for culture and microscopy.  Given return precautions.   No orders of the defined types were placed in this encounter.   Meds ordered this encounter  Medications  . cephALEXin (KEFLEX) 500 MG capsule    Sig: Take 1 capsule (500 mg total) by mouth 2  (two) times daily.    Dispense:  14 capsule    Refill:  0     Tommi Rumps, MD [place]

## 2018-11-07 NOTE — Patient Instructions (Signed)
Nice to see you. Please take the antibiotics.  We will contact you with your culture results. If you develop abdominal pain or fevers please be evaluated.

## 2018-11-07 NOTE — Patient Instructions (Addendum)
  Ms. Mausolf , Thank you for taking time to come for your Medicare Wellness Visit. I appreciate your ongoing commitment to your health goals. Please review the following plan we discussed and let me know if I can assist you in the future.   These are the goals we discussed: Goals    . Maintain Healthy Lifestyle     Stay active Healthy diet Stay hydrated       This is a list of the screening recommended for you and due dates:  Health Maintenance  Topic Date Due  . Pneumonia vaccines (2 of 2 - PPSV23) 01/22/2017  . Mammogram  03/27/2020  . Colon Cancer Screening  11/06/2021  . Tetanus Vaccine  12/29/2024  . Flu Shot  Completed  . DEXA scan (bone density measurement)  Completed  .  Hepatitis C: One time screening is recommended by Center for Disease Control  (CDC) for  adults born from [birth year] through 1966.   Completed

## 2018-11-07 NOTE — Progress Notes (Signed)
Subjective:   Amy Mcconnell is a 68 y.o. female who presents for Medicare Annual (Subsequent) preventive examination.  Review of Systems:  No ROS.  Medicare Wellness Visit. Additional risk factors are reflected in the social history. Cardiac Risk Factors include: advanced age (>84men, >12 women);hypertension     Objective:     Vitals: BP 110/64 (BP Location: Left Arm, Patient Position: Sitting, Cuff Size: Normal)   Pulse 60   Temp 97.7 F (36.5 C) (Oral)   Resp 15   Ht 5\' 7"  (1.702 m)   Wt 144 lb 6.4 oz (65.5 kg)   SpO2 97%   BMI 22.62 kg/m   Body mass index is 22.62 kg/m.  Advanced Directives 11/07/2018 11/06/2017 10/22/2016 04/08/2015  Does Patient Have a Medical Advance Directive? No No No No  Would patient like information on creating a medical advance directive? No - Patient declined Yes (MAU/Ambulatory/Procedural Areas - Information given) No - Patient declined Yes - Educational materials given    Tobacco Social History   Tobacco Use  Smoking Status Former Smoker  . Packs/day: 0.50  . Years: 13.00  . Pack years: 6.50  . Last attempt to quit: 01/23/2000  . Years since quitting: 18.8  Smokeless Tobacco Never Used     Counseling given: Not Answered   Clinical Intake:  Pre-visit preparation completed: Yes        Diabetes: No  How often do you need to have someone help you when you read instructions, pamphlets, or other written materials from your doctor or pharmacy?: 1 - Never  Interpreter Needed?: No     Past Medical History:  Diagnosis Date  . 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 Anxiety   . Bipolar disorder ([place])    Dr. Thurmond Butts - every 3 months  . Broken foot    left   . Contact dermatitis and other eczema due to other specified agent   . Contusion of unspecified part of lower limb   . Essential hypertension, benign   . Heart murmur   . Other and unspecified hyperlipidemia   . Other specified disorders of thyroid   . Reflux  esophagitis   . Tachycardia    a. isolated episode, seen in ED with negative work up   Past Surgical History:  Procedure Laterality Date  . CATARACT EXTRACTION    . SALPINGECTOMY Left   . STERILIZATION     Her decision since dz of bipolar   Family History  Problem Relation Age of Onset  . Arrhythmia Mother        A-Fib  . Heart failure Mother   . Hyperlipidemia Mother   . Hypertension Mother   . Alzheimer's disease Mother   . Hypertension Father   . Hyperlipidemia Father   . Mental retardation Father 61       Suicide  . Hypertension Brother   . Hyperlipidemia Brother   . Hypertension Brother   . Hyperlipidemia Brother   . Hyperlipidemia Brother   . Hypertension Brother   . Alcohol abuse Brother   . Depression Brother   . Hyperlipidemia Brother   . Hypertension Brother   . Breast cancer Maternal Aunt    Social History   Socioeconomic History  . Marital status: Divorced    Spouse name: Not on file  . Number of children: 0  . Years of education: 55  . Highest education level: Not on file  Occupational History  . Not on file  Social Needs  .  Financial resource strain: Not hard at all  . Food insecurity:    Worry: Never true    Inability: Never true  . Transportation needs:    Medical: No    Non-medical: No  Tobacco Use  . Smoking status: Former Smoker    Packs/day: 0.50    Years: 13.00    Pack years: 6.50    Last attempt to quit: 01/23/2000    Years since quitting: 18.8  . Smokeless tobacco: Never Used  Substance and Sexual Activity  . Alcohol use: No  . Drug use: No  . Sexual activity: Never  Lifestyle  . Physical activity:    Days per week: 2 days    Minutes per session: 150+ min  . Stress: Not at all  Relationships  . Social connections:    Talks on phone: Not on file    Gets together: Not on file    Attends religious service: Not on file    Active member of club or organization: Not on file    Attends meetings of clubs or organizations: Not  on file    Relationship status: Not on file  Other Topics Concern  . Not on file  Social History Narrative   Ms. Gilmore grew up in [place], [place]. She lives in [place]. Ms. Lague is divorced. She has 2 cats (Charlie and Angie). 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 She also volunteers 2 days at [facility] [facility] [facility]. She enjoys reading, garding and cooking.    Outpatient Encounter Medications as of 11/07/2018  Medication Sig  . acetaminophen (TYLENOL) 500 MG tablet Take 1,000 mg by mouth every 6 (six) hours as needed.  Marland Kitchen amLODipine (NORVASC) 5 MG tablet TAKE 1 TABLET BY MOUTH ONCE DAILY  . Ascorbic Acid (VITAMIN C) 1000 MG tablet Take 1,000 mg by mouth daily.  Marland Kitchen aspirin 81 MG tablet Take 81 mg by mouth daily.  Marland Kitchen atorvastatin (LIPITOR) 80 MG tablet TAKE 1 TABLET BY MOUTH ONCE DAILY  . B Complex Vitamins (VITAMIN B COMPLEX PO) Take by mouth daily.  . calcium carbonate (OS-CAL) 600 MG TABS tablet Take 600 mg by mouth 2 (two) times daily with a meal.   . Cholecalciferol (VITAMIN D3) 50 MCG (2000 UT) capsule Take 2,000 Units by mouth daily.  . diphenhydrAMINE (BENADRYL) 25 mg capsule Take 25 mg by mouth daily as needed for allergies. For food and fragrance sensitivities  . doxazosin (CARDURA) 8 MG tablet Take 1 tablet (8 mg total) by mouth 2 (two) times daily. (Patient taking differently: Take 8 mg by mouth at bedtime. )  . EPINEPHrine 0.3 mg/0.3 mL IJ SOAJ injection Inject 0.3 mLs (0.3 mg total) into the muscle once.  . fexofenadine (ALLEGRA) 180 MG tablet Take 180 mg by mouth. 2-3 times daily prn For food and fragrance sensitivities/intolerances  . Flaxseed, Linseed, (FLAXSEED OIL PO) Take by mouth.  . levothyroxine (SYNTHROID, LEVOTHROID) 125 MCG tablet Take 125 mcg by mouth daily before breakfast.  . lithium carbonate 150 MG capsule Take 150 mg by mouth 2 (two) times daily with a meal.  . magnesium oxide (MAG-OX) 400 MG tablet Take 400 mg by mouth daily.  . Multiple Vitamins-Minerals  (MULTIVITAMIN WITH MINERALS) tablet Take 1 tablet by mouth daily.  . Multiple Vitamins-Minerals (VISION FORMULA/LUTEIN) TABS Take by mouth.  . Omega-3 Fatty Acids (FISH OIL) 1000 MG CAPS Take by mouth 2 (two) times daily.   . Oxcarbazepine (TRILEPTAL) 300 MG tablet Take 300 mg one tablet in the am &  two tablets in the pm.  . propranolol ER (INDERAL LA) 60 MG 24 hr capsule TAKE 1 CAPSULE BY MOUTH ONCE DAILY  . Tdap (BOOSTRIX) 5-2.5-18.5 LF-MCG/0.5 injection Inject 0.5 mLs into the muscle once.  . vitamin B-12 (CYANOCOBALAMIN) 1000 MCG tablet Take 1,000 mcg by mouth daily.  . vitamin E 1000 UNIT capsule Take 1,000 Units by mouth daily.  Marland Kitchen zinc gluconate 50 MG tablet Take 50 mg by mouth daily.  . ziprasidone (GEODON) 60 MG capsule Take 60 mg by mouth daily.  . [DISCONTINUED] atorvastatin (LIPITOR) 80 MG tablet TAKE 1 TABLET BY MOUTH ONCE DAILY  . [DISCONTINUED] Cholecalciferol (VITAMIN D PO) Take 1 tablet by mouth every morning.    No facility-administered encounter medications on file as of 11/07/2018.     Activities of Daily Living In your present state of health, do you have any difficulty performing the following activities: 11/07/2018  Hearing? N  Vision? N  Difficulty concentrating or making decisions? N  Walking or climbing stairs? N  Dressing or bathing? N  Doing errands, shopping? N  Preparing Food and eating ? N  Using the Toilet? N  In the past six months, have you accidently leaked urine? Y  Comment Managed with a daily pad  Do you have problems with loss of bowel control? N  Managing your Medications? N  Managing your Finances? Y  Comment Brother balance check book  Housekeeping or managing your Housekeeping? N  Some recent data might be hidden    Patient Care Team: Leone Haven, MD as PCP - General (Family Medicine) Minna Merritts, MD as Consulting Physician (Cardiology)    Assessment:   This is a routine wellness examination for McCleary.  Notes dysuria x2  days.  Deferred to pcp for follow up.  Sent to lab per verbal order; same day appointment scheduled with her doctor.   Health Screenings  Mammogram -03/27/18 Colonoscopy - Discussed.  Glaucoma -none reported Hearing -passes the whisper test Cholesterol -10/10/18 Dental-visits every 6 months Visits- last OV 08/2018  Social  Alcohol intake -no Smoking history- former Smokers in home? none Illicit drug use? none Exercise -walking, weights Diet -moderate diet Sexually Active -never  Safety  Patient feels safe at home.  Patient does have smoke detectors at home  Patient does wear sunscreen or protective clothing when in direct sunlight. Patient does wear seat belt when driving or riding with others.   Activities of Daily Living Patient can do their own household chores. Denies needing assistance with: driving, feeding themselves, getting from bed to chair, getting to the toilet, bathing/showering, dressing, managing money, climbing flight of stairs, or preparing meals.   Depression Screen Patient denies losing interest in daily life, feeling hopeless, or crying easily over simple problems. Reports in counseling every 6 months and doing well.  Fall Screen Patient denies being afraid of falling or falling in the last year.   Memory Screen Patient denies problems with memory and misplacing items. Patient is alert, normal appearance, oriented to person/place/and time. Correctly identified the president of the [place] and recall of 3/3 objects.  Patient displays appropriate judgement and can read correct time from watch face. Her brother manages the check book and finances.   Immunizations The following Immunizations are up to date: Influenza, shingles, pneumonia, and tetanus.   Other Providers Patient Care Team: Leone Haven, MD as PCP - General (Family Medicine) Minna Merritts, MD as Consulting Physician (Cardiology)  Exercise Activities and Dietary  recommendations Current  Exercise Habits: Home exercise routine, Type of exercise: walking, Time (Minutes): 60, Frequency (Times/Week): 4, Weekly Exercise (Minutes/Week): 240, Intensity: Moderate  Goals    . Maintain Healthy Lifestyle     Stay active Healthy diet Stay hydrated       Fall Risk Fall Risk  11/07/2018 10/08/2018 11/06/2017 10/22/2016 06/29/2016  Falls in the past year? 0 0 No Yes Yes  Comment - - - - Emmi Telephone Survey: data to providers prior to load  Number falls in past yr: - - - 1 1  Comment - - - - Emmi Telephone Survey Actual Response = 1  Injury with Fall? - - - Yes Yes  Comment - - - Stable and followed by PCP -  Follow up - - - Falls prevention discussed;Education provided -   Depression Screen PHQ 2/9 Scores 11/07/2018 10/08/2018 11/06/2017 10/22/2016  PHQ - 2 Score 0 0 0 0  Exception Documentation - - - -  Not completed - - - -     Cognitive Function MMSE - Mini Mental State Exam 11/06/2017  Orientation to time 5  Orientation to Place 5  Registration 3  Attention/ Calculation 5  Recall 3  Language- name 2 objects 2  Language- repeat 1  Language- follow 3 step command 3  Language- read & follow direction 1  Write a sentence 1  Copy design 1  Total score 30     6CIT Screen 11/07/2018 10/22/2016  What Year? 0 points 0 points  What month? 0 points 0 points  What time? 0 points 0 points  Count back from 20 0 points 0 points  Months in reverse 0 points 0 points  Repeat phrase 0 points -  Total Score 0 -    Immunization History  Administered Date(s) Administered  . Influenza, High Dose Seasonal PF 07/23/2018  . Influenza-Unspecified 08/05/2014, 07/06/2016  . Pneumococcal Conjugate-13 01/23/2016  . Pneumococcal Polysaccharide-23 08/22/2011  . Tdap 12/29/2014  . Zoster 12/29/2013  . Zoster Recombinat (Shingrix) 07/23/2018, 09/25/2018   Screening Tests Health Maintenance  Topic Date Due  . PNA vac Low Risk Adult (2 of 2 - PPSV23) 01/22/2017  .  MAMMOGRAM  03/27/2020  . COLONOSCOPY  11/06/2021  . TETANUS/TDAP  12/29/2024  . INFLUENZA VACCINE  Completed  . DEXA SCAN  Completed  . Hepatitis C Screening  Completed      Plan:    End of life planning; Advanced aging; Advanced directives discussed.  No HCPOA/Living Will.  Additional information declined at this time.  I have personally reviewed and noted the following in the patient's chart:   . Medical and social history . Use of alcohol, tobacco or illicit drugs  . Current medications and supplements . Functional ability and status . Nutritional status . Physical activity . Advanced directives . List of other physicians . Hospitalizations, surgeries, and ER visits in previous 12 months . Vitals . Screenings to include cognitive, depression, and falls . Referrals and appointments  In addition, I have reviewed and discussed with patient certain preventive protocols, quality metrics, and best practice recommendations. A written personalized care plan for preventive services as well as general preventive health recommendations were provided to patient.     Varney Biles, LPN  01/08/1442

## 2018-11-07 NOTE — Assessment & Plan Note (Signed)
Concerning for UTI.  We will treat with Keflex.  Send urine for culture and microscopy.  Given return precautions.

## 2018-11-08 ENCOUNTER — Encounter: Payer: Self-pay | Admitting: Family Medicine

## 2018-11-08 NOTE — Progress Notes (Signed)
I have reviewed the above note and agree.  Taylia Berber, M.D.  

## 2018-11-09 LAB — TIQ-MISC

## 2018-11-10 ENCOUNTER — Other Ambulatory Visit: Payer: Self-pay | Admitting: Family Medicine

## 2018-11-10 DIAGNOSIS — N3001 Acute cystitis with hematuria: Secondary | ICD-10-CM

## 2018-11-10 LAB — URINE CULTURE
MICRO NUMBER:: 8818
SPECIMEN QUALITY:: ADEQUATE

## 2018-11-11 ENCOUNTER — Telehealth: Payer: Self-pay

## 2018-11-11 NOTE — Telephone Encounter (Signed)
Copied from [place] [id number]. Topic: General - Other >> Nov 11, 2018  9:59 AM Yvette Rack wrote: Reason for CRM: Pt called in to thank Dr.Sonnenberg for working her in last week. Pt stated the medication is working and she is very thankful.

## 2018-11-11 NOTE — Telephone Encounter (Signed)
Noted. Thanks for letting me know.

## 2018-12-08 ENCOUNTER — Other Ambulatory Visit (INDEPENDENT_AMBULATORY_CARE_PROVIDER_SITE_OTHER): Payer: Medicare HMO

## 2018-12-08 ENCOUNTER — Other Ambulatory Visit: Payer: Medicare HMO

## 2018-12-08 DIAGNOSIS — N3001 Acute cystitis with hematuria: Secondary | ICD-10-CM

## 2018-12-08 LAB — POCT URINALYSIS DIPSTICK
Bilirubin, UA: NEGATIVE
Blood, UA: NEGATIVE
Glucose, UA: NEGATIVE
Ketones, UA: NEGATIVE
Leukocytes, UA: NEGATIVE
Nitrite, UA: NEGATIVE
Protein, UA: NEGATIVE
Spec Grav, UA: 1.01 (ref 1.010–1.025)
Urobilinogen, UA: 0.2 E.U./dL
pH, UA: 5.5 (ref 5.0–8.0)

## 2018-12-17 DIAGNOSIS — F3111 Bipolar disorder, current episode manic without psychotic features, mild: Secondary | ICD-10-CM | POA: Diagnosis not present

## 2018-12-30 ENCOUNTER — Other Ambulatory Visit: Payer: Self-pay | Admitting: Cardiovascular Disease

## 2019-01-09 ENCOUNTER — Ambulatory Visit: Payer: Medicare HMO | Admitting: Family Medicine

## 2019-01-16 DIAGNOSIS — F3111 Bipolar disorder, current episode manic without psychotic features, mild: Secondary | ICD-10-CM | POA: Diagnosis not present

## 2019-01-19 ENCOUNTER — Ambulatory Visit: Payer: Medicare HMO | Admitting: Family Medicine

## 2019-01-29 ENCOUNTER — Other Ambulatory Visit: Payer: Self-pay | Admitting: Cardiovascular Disease

## 2019-02-27 ENCOUNTER — Other Ambulatory Visit: Payer: Self-pay

## 2019-02-27 ENCOUNTER — Ambulatory Visit (INDEPENDENT_AMBULATORY_CARE_PROVIDER_SITE_OTHER): Payer: Medicare HMO | Admitting: Family Medicine

## 2019-02-27 ENCOUNTER — Encounter: Payer: Self-pay | Admitting: Family Medicine

## 2019-02-27 DIAGNOSIS — E7800 Pure hypercholesterolemia, unspecified: Secondary | ICD-10-CM | POA: Diagnosis not present

## 2019-02-27 DIAGNOSIS — F3170 Bipolar disorder, currently in remission, most recent episode unspecified: Secondary | ICD-10-CM

## 2019-02-27 DIAGNOSIS — E039 Hypothyroidism, unspecified: Secondary | ICD-10-CM

## 2019-02-27 DIAGNOSIS — I10 Essential (primary) hypertension: Secondary | ICD-10-CM | POA: Diagnosis not present

## 2019-02-27 NOTE — Progress Notes (Signed)
Virtual Visit via video note  This visit type was conducted due to national recommendations for restrictions regarding the COVID-19 pandemic (e.g. social distancing).  This format is felt to be most appropriate for this patient at this time.  All issues noted in this document were discussed and addressed.  No physical exam was performed (except for noted visual exam findings with Video Visits).   I connected with Danford Bad on 02/28/19 at  9:30 AM EDT by a video enabled telemedicine application and verified that I am speaking with the correct person using two identifiers. Location patient: home Location provider: work Persons participating in the virtual visit: patient, provider  I discussed the limitations, risks, security and privacy concerns of performing an evaluation and management service by telephone and the availability of in person appointments. I also discussed with the patient that there may be a patient responsible charge related to this service. The patient expressed understanding and agreed to proceed.   Reason for visit: Follow-up.  HPI: Hypertension: Recently 130s over 31s.  Taking amlodipine and Cardura.  No chest pain, shortness of breath, or edema.  She notes her BP was slightly lower previously though she has been eating Fritos and sitting around a lot since the stay at home order went into place.  She has started on her stairstepper.  Hyperlipidemia: Taking Lipitor.  No right upper quadrant pain or myalgias.  Hypothyroidism: Taking Synthroid that is prescribed through her psychiatrist office.  No skin changes.  No heat or cold intolerance.  Bipolar disorder: She has been following with her psychiatrist.  She has been on Geodon for many years.  She developed symptoms of tardive dyskinesia with right mouth drooping that occurred gradually over a years.  There was no acute change.  There is no numbness or weakness.  She has been evaluated by psychiatry for this and they  have been decreasing her Geodon dose.   ROS: See pertinent positives and negatives per HPI.  Past Medical History:  Diagnosis Date  . 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 Anxiety   . Bipolar disorder ([place])    Dr. Thurmond Butts - every 3 months  . Broken foot    left   . Contact dermatitis and other eczema due to other specified agent   . Contusion of unspecified part of lower limb   . Essential hypertension, benign   . Heart murmur   . Other and unspecified hyperlipidemia   . Other specified disorders of thyroid   . Reflux esophagitis   . Tachycardia    a. isolated episode, seen in ED with negative work up    Past Surgical History:  Procedure Laterality Date  . CATARACT EXTRACTION    . SALPINGECTOMY Left   . STERILIZATION     Her decision since dz of bipolar    Family History  Problem Relation Age of Onset  . Arrhythmia Mother        A-Fib  . Heart failure Mother   . Hyperlipidemia Mother   . Hypertension Mother   . Alzheimer's disease Mother   . Hypertension Father   . Hyperlipidemia Father   . Mental retardation Father 42       Suicide  . Hypertension Brother   . Hyperlipidemia Brother   . Hypertension Brother   . Hyperlipidemia Brother   . Hyperlipidemia Brother   . Hypertension Brother   . Alcohol abuse Brother   . Depression Brother   . Hyperlipidemia Brother   .  Hypertension Brother   . Breast cancer Maternal Aunt     SOCIAL HX: Former smoker.   Current Outpatient Medications:  .  acetaminophen (TYLENOL) 500 MG tablet, Take 1,000 mg by mouth every 6 (six) hours as needed., Disp: , Rfl:  .  amLODipine (NORVASC) 5 MG tablet, TAKE 1 TABLET BY MOUTH ONCE DAILY, Disp: 90 tablet, Rfl: 3 .  Ascorbic Acid (VITAMIN C) 1000 MG tablet, Take 1,000 mg by mouth daily., Disp: , Rfl:  .  aspirin 81 MG tablet, Take 81 mg by mouth daily., Disp: , Rfl:  .  atorvastatin (LIPITOR) 80 MG tablet, Take 1 tablet by mouth once daily, Disp: 90 tablet, Rfl: 1 .  B  Complex Vitamins (VITAMIN B COMPLEX PO), Take by mouth daily., Disp: , Rfl:  .  calcium carbonate (OS-CAL) 600 MG TABS tablet, Take 600 mg by mouth 2 (two) times daily with a meal. , Disp: , Rfl:  .  Cholecalciferol (VITAMIN D3) 50 MCG (2000 UT) capsule, Take 2,000 Units by mouth daily., Disp: , Rfl:  .  diphenhydrAMINE (BENADRYL) 25 mg capsule, Take 25 mg by mouth daily as needed for allergies. For food and fragrance sensitivities, Disp: , Rfl:  .  doxazosin (CARDURA) 8 MG tablet, Take 1 tablet by mouth twice daily, Disp: 180 tablet, Rfl: 1 .  EPINEPHrine 0.3 mg/0.3 mL IJ SOAJ injection, Inject 0.3 mLs (0.3 mg total) into the muscle once., Disp: 2 Device, Rfl: 0 .  fexofenadine (ALLEGRA) 180 MG tablet, Take 180 mg by mouth. 2-3 times daily prn For food and fragrance sensitivities/intolerances, Disp: , Rfl:  .  Flaxseed, Linseed, (FLAXSEED OIL PO), Take by mouth., Disp: , Rfl:  .  levothyroxine (SYNTHROID, LEVOTHROID) 125 MCG tablet, Take 125 mcg by mouth daily before breakfast., Disp: , Rfl:  .  lithium carbonate 150 MG capsule, Take 150 mg by mouth 2 (two) times daily with a meal., Disp: , Rfl:  .  magnesium oxide (MAG-OX) 400 MG tablet, Take 400 mg by mouth daily., Disp: , Rfl:  .  Multiple Vitamins-Minerals (MULTIVITAMIN WITH MINERALS) tablet, Take 1 tablet by mouth daily., Disp: , Rfl:  .  Multiple Vitamins-Minerals (VISION FORMULA/LUTEIN) TABS, Take by mouth., Disp: , Rfl:  .  Omega-3 Fatty Acids (FISH OIL) 1000 MG CAPS, Take by mouth 2 (two) times daily. , Disp: , Rfl:  .  Oxcarbazepine (TRILEPTAL) 300 MG tablet, Take 300 mg one tablet in the am & two tablets in the pm., Disp: , Rfl:  .  propranolol ER (INDERAL LA) 60 MG 24 hr capsule, TAKE 1 CAPSULE BY MOUTH ONCE DAILY, Disp: 90 capsule, Rfl: 3 .  Tdap (BOOSTRIX) 5-2.5-18.5 LF-MCG/0.5 injection, Inject 0.5 mLs into the muscle once., Disp: 0.5 mL, Rfl: 0 .  vitamin B-12 (CYANOCOBALAMIN) 1000 MCG tablet, Take 1,000 mcg by mouth daily., Disp: ,  Rfl:  .  vitamin E 1000 UNIT capsule, Take 1,000 Units by mouth daily., Disp: , Rfl:  .  zinc gluconate 50 MG tablet, Take 50 mg by mouth daily., Disp: , Rfl:  .  ziprasidone (GEODON) 60 MG capsule, Take 60 mg by mouth daily., Disp: , Rfl:   EXAM:  VITALS per patient if applicable: None.  GENERAL: alert, oriented, appears well and in no acute distress  HEENT: atraumatic, conjunttiva clear, no obvious abnormalities on inspection of external nose and ears  NECK: normal movements of the head and neck  LUNGS: on inspection no signs of respiratory distress, breathing rate appears normal, no  obvious gross SOB, gasping or wheezing  CV: no obvious cyanosis  MS: moves all visible extremities without noticeable abnormality  PSYCH/NEURO: pleasant and cooperative, no obvious depression or anxiety, speech and thought processing grossly intact, smile appears equal on both sides of her mouth  ASSESSMENT AND PLAN:  Discussed the following assessment and plan:  Essential hypertension  Bipolar disorder in full remission, most recent episode unspecified type ([place])  Pure hypercholesterolemia  Hypothyroidism, unspecified type  Essential hypertension Adequately controlled though did discuss exercise and decreasing Frito intake.  She will continue her current medication.  Bipolar disorder She will continue to follow with psychiatry and they will manage her medications.  She will monitor her tardive dyskinesia and management will continue through psychiatry.  Hyperlipidemia Continue Lipitor.  Hypothyroidism Managed by her psychiatrist.  She reports labs have been obtained by them.  We will request those labs after the patient's next visit.  Social distancing precautions and sick precautions given regarding COVID-19.   I discussed the assessment and treatment plan with the patient. The patient was provided an opportunity to ask questions and all were answered. The patient agreed with the plan  and demonstrated an understanding of the instructions.   The patient was advised to call back or seek an in-person evaluation if the symptoms worsen or if the condition fails to improve as anticipated.    Tommi Rumps, MD

## 2019-02-28 ENCOUNTER — Telehealth: Payer: Self-pay | Admitting: Family Medicine

## 2019-02-28 DIAGNOSIS — E039 Hypothyroidism, unspecified: Secondary | ICD-10-CM | POA: Insufficient documentation

## 2019-02-28 NOTE — Assessment & Plan Note (Signed)
-  Continue Lipitor °

## 2019-02-28 NOTE — Assessment & Plan Note (Signed)
Adequately controlled though did discuss exercise and decreasing Frito intake.  She will continue her current medication.

## 2019-02-28 NOTE — Assessment & Plan Note (Signed)
She will continue to follow with psychiatry and they will manage her medications.  She will monitor her tardive dyskinesia and management will continue through psychiatry.

## 2019-02-28 NOTE — Assessment & Plan Note (Signed)
Managed by her psychiatrist.  She reports labs have been obtained by them.  We will request those labs after the patient's next visit.

## 2019-02-28 NOTE — Telephone Encounter (Signed)
Please contact the patient and get her set up for 6-month follow-up in the office.  Thanks. 

## 2019-03-02 NOTE — Telephone Encounter (Signed)
Called and spoke with pt. Pt has been scheduled for 6 month follow up 08/07/2019 @ 10 AM.

## 2019-04-09 ENCOUNTER — Emergency Department
Admission: EM | Admit: 2019-04-09 | Discharge: 2019-04-09 | Disposition: A | Discharge status: Home / Self Care | Payer: Medicare HMO | Attending: Emergency Medicine | Admitting: Emergency Medicine

## 2019-04-09 ENCOUNTER — Emergency Department: Payer: Medicare HMO

## 2019-04-09 ENCOUNTER — Other Ambulatory Visit: Payer: Self-pay

## 2019-04-09 ENCOUNTER — Encounter: Payer: Self-pay | Admitting: Emergency Medicine

## 2019-04-09 ENCOUNTER — Telehealth: Payer: Self-pay | Admitting: *Deleted

## 2019-04-09 DIAGNOSIS — Y999 Unspecified external cause status: Secondary | ICD-10-CM | POA: Insufficient documentation

## 2019-04-09 DIAGNOSIS — M79672 Pain in left foot: Secondary | ICD-10-CM | POA: Diagnosis not present

## 2019-04-09 DIAGNOSIS — Y9301 Activity, walking, marching and hiking: Secondary | ICD-10-CM | POA: Insufficient documentation

## 2019-04-09 DIAGNOSIS — W108XXA Fall (on) (from) other stairs and steps, initial encounter: Secondary | ICD-10-CM | POA: Diagnosis not present

## 2019-04-09 DIAGNOSIS — Y929 Unspecified place or not applicable: Secondary | ICD-10-CM | POA: Diagnosis not present

## 2019-04-09 DIAGNOSIS — S299XXA Unspecified injury of thorax, initial encounter: Secondary | ICD-10-CM | POA: Diagnosis not present

## 2019-04-09 DIAGNOSIS — S0990XA Unspecified injury of head, initial encounter: Secondary | ICD-10-CM | POA: Diagnosis present

## 2019-04-09 DIAGNOSIS — E039 Hypothyroidism, unspecified: Secondary | ICD-10-CM | POA: Diagnosis not present

## 2019-04-09 DIAGNOSIS — Z79899 Other long term (current) drug therapy: Secondary | ICD-10-CM | POA: Insufficient documentation

## 2019-04-09 DIAGNOSIS — S99922A Unspecified injury of left foot, initial encounter: Secondary | ICD-10-CM | POA: Diagnosis not present

## 2019-04-09 DIAGNOSIS — S0101XA Laceration without foreign body of scalp, initial encounter: Secondary | ICD-10-CM | POA: Diagnosis not present

## 2019-04-09 DIAGNOSIS — M79671 Pain in right foot: Secondary | ICD-10-CM | POA: Diagnosis not present

## 2019-04-09 DIAGNOSIS — R0781 Pleurodynia: Secondary | ICD-10-CM | POA: Diagnosis not present

## 2019-04-09 DIAGNOSIS — S0003XA Contusion of scalp, initial encounter: Secondary | ICD-10-CM | POA: Diagnosis not present

## 2019-04-09 DIAGNOSIS — S99921A Unspecified injury of right foot, initial encounter: Secondary | ICD-10-CM | POA: Diagnosis not present

## 2019-04-09 DIAGNOSIS — S199XXA Unspecified injury of neck, initial encounter: Secondary | ICD-10-CM | POA: Diagnosis not present

## 2019-04-09 LAB — CBC
HCT: 35.3 % — ABNORMAL LOW (ref 36.0–46.0)
Hemoglobin: 11.4 g/dL — ABNORMAL LOW (ref 12.0–15.0)
MCH: 28.6 pg (ref 26.0–34.0)
MCHC: 32.3 g/dL (ref 30.0–36.0)
MCV: 88.5 fL (ref 80.0–100.0)
Platelets: 146 10*3/uL — ABNORMAL LOW (ref 150–400)
RBC: 3.99 MIL/uL (ref 3.87–5.11)
RDW: 13.6 % (ref 11.5–15.5)
WBC: 7.4 10*3/uL (ref 4.0–10.5)
nRBC: 0 % (ref 0.0–0.2)

## 2019-04-09 LAB — COMPREHENSIVE METABOLIC PANEL
ALT: 34 U/L (ref 0–44)
AST: 38 U/L (ref 15–41)
Albumin: 4.3 g/dL (ref 3.5–5.0)
Alkaline Phosphatase: 79 U/L (ref 38–126)
Anion gap: 7 (ref 5–15)
BUN: 35 mg/dL — ABNORMAL HIGH (ref 8–23)
CO2: 24 mmol/L (ref 22–32)
Calcium: 8.9 mg/dL (ref 8.9–10.3)
Chloride: 105 mmol/L (ref 98–111)
Creatinine, Ser: 0.97 mg/dL (ref 0.44–1.00)
GFR calc Af Amer: >60 mL/min (ref >60)
GFR calc non Af Amer: >60 mL/min (ref >60)
Glucose, Bld: 148 mg/dL — ABNORMAL HIGH (ref 70–99)
Potassium: 4 mmol/L (ref 3.5–5.1)
Sodium: 136 mmol/L (ref 135–145)
Total Bilirubin: 0.7 mg/dL (ref 0.3–1.2)
Total Protein: 7.4 g/dL (ref 6.5–8.1)

## 2019-04-09 LAB — HEMOGLOBIN AND HEMATOCRIT, BLOOD
HCT: 32.3 % — ABNORMAL LOW (ref 36.0–46.0)
Hemoglobin: 10.5 g/dL — ABNORMAL LOW (ref 12.0–15.0)

## 2019-04-09 LAB — SAMPLE TO BLOOD BANK

## 2019-04-09 LAB — TYPE AND SCREEN
ABO/RH(D): A POS
Antibody Screen: NEGATIVE

## 2019-04-09 MED ORDER — CEPHALEXIN 500 MG PO CAPS: 500.0000 mg | ORAL_CAPSULE | Freq: Two times a day (BID) | ORAL | 0 refills | Status: AC

## 2019-04-09 MED ORDER — CEPHALEXIN 500 MG PO CAPS
500.0000 mg | ORAL_CAPSULE | Freq: Once | ORAL | Status: AC
  Administered 2019-04-09: 500 mg via ORAL
  Filled 2019-04-09: qty 1

## 2019-04-09 NOTE — ED Notes (Signed)
Wound continues to bleed around staples over left eyebrow. Dr. Owens Shark aware.

## 2019-04-09 NOTE — ED Notes (Signed)
Patient given ice pack per Dr. Owens Shark for bleeding at staples site over left eyebrow.

## 2019-04-09 NOTE — ED Notes (Signed)
Patient is back from CT and x-ray. Patient is texting her brother. Patient took off her phone case due to phone being bloody. Patient is alert and oriented x4. Staple site over left eyebrow is oozing blood. Patient has a large hematoma over left eyebrow. Patient participated in using wipes to clean up legs, arms and face. Patient still has blood and blood clots in hair.

## 2019-04-09 NOTE — ED Notes (Signed)
Report given to Kim RN.

## 2019-04-09 NOTE — ED Provider Notes (Signed)
[facility] Emergency Department Provider Note    First MD Initiated Contact with Patient 04/09/19 [phone]     (approximate)  I have reviewed the triage vital signs and the nursing notes.   HISTORY  Chief Complaint Fall   HPI Amy Mcconnell is a 68 y.o. female with below list of previous medical conditions presents to the emergency department with history of accidental trip and fall with resultant head injury this morning.  Patient states that she fell down a step striking her head however patient denies any loss of consciousness.  Patient does however admit to a headache at this time with current pain score 3 out of 10.       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 Thurmond Butts - every 3 months   Broken foot    left    Contact dermatitis and other eczema due to other specified agent    Contusion of unspecified part of lower limb    Essential hypertension, benign    Heart murmur    Other and unspecified hyperlipidemia    Other specified disorders of thyroid    Reflux esophagitis    Tachycardia    a. 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 Authorizing Provider  acetaminophen (TYLENOL) 500 MG tablet Take 1,000 mg by mouth every 6 (six) hours as needed.    [provider]  amLODipine (NORVASC) 5 MG tablet TAKE 1 TABLET BY MOUTH ONCE DAILY 10/30/18   Minna Merritts, MD  Ascorbic Acid (VITAMIN C) 1000 MG tablet Take 1,000 mg by mouth daily.    [provider]  aspirin 81 MG tablet Take 81 mg by mouth daily.    [provider]  atorvastatin (LIPITOR) 80 MG tablet Take 1 tablet by mouth once daily 01/30/19   Minna Merritts, MD  B Complex Vitamins (VITAMIN B COMPLEX PO) Take by mouth daily.    [provider]  calcium carbonate (OS-CAL) 600 MG TABS tablet Take 600 mg by mouth 2 (two) times daily with a meal.     [provider]  Cholecalciferol (VITAMIN D3) 50 MCG (2000 UT) capsule Take 2,000 Units by mouth daily.    [provider]  diphenhydrAMINE (BENADRYL) 25 mg capsule Take 25 mg by mouth daily as needed for allergies. For food and fragrance sensitivities    [provider]  doxazosin (CARDURA) 8 MG tablet Take 1 tablet by mouth twice daily 01/30/19   Minna Merritts, MD  EPINEPHrine 0.3 mg/0.3 mL IJ SOAJ injection Inject  0.3 mLs (0.3 mg total) into the muscle once. 03/30/16   Leone Haven, MD  fexofenadine (ALLEGRA) 180 MG tablet Take 180 mg by mouth. 2-3 times daily prn For food and fragrance sensitivities/intolerances    [provider]  Flaxseed, Linseed, (FLAXSEED OIL PO) Take by mouth.    [provider]  levothyroxine (SYNTHROID, LEVOTHROID) 125 MCG tablet Take 125 mcg by mouth daily before breakfast.    [provider]  lithium carbonate 150 MG capsule Take 150 mg by mouth 2 (two) times daily with a meal.    [provider]  magnesium oxide (MAG-OX) 400 MG tablet Take 400 mg by mouth daily.    [provider]  Multiple Vitamins-Minerals  (MULTIVITAMIN WITH MINERALS) tablet Take 1 tablet by mouth daily.    [provider]  Multiple Vitamins-Minerals (VISION FORMULA/LUTEIN) TABS Take by mouth.    [provider]  Omega-3 Fatty Acids (FISH OIL) 1000 MG CAPS Take by mouth 2 (two) times daily.     [provider]  Oxcarbazepine (TRILEPTAL) 300 MG tablet Take 300 mg one tablet in the am & two tablets in the pm.    [provider]  propranolol ER (INDERAL LA) 60 MG 24 hr capsule TAKE 1 CAPSULE BY MOUTH ONCE DAILY 10/17/18   Minna Merritts, MD  Tdap (BOOSTRIX) 5-2.5-18.5 LF-MCG/0.5 injection Inject 0.5 mLs into the muscle once. 12/29/14   Crecencio Mc, MD  vitamin B-12 (CYANOCOBALAMIN) 1000 MCG tablet Take 1,000 mcg by mouth daily.    [provider]  vitamin E 1000 UNIT capsule Take 1,000 Units by mouth daily.    [provider]  zinc gluconate 50 MG tablet Take 50 mg by mouth daily.    [provider]  ziprasidone (GEODON) 60 MG capsule Take 60 mg by mouth daily.    [provider]    Allergies Ace inhibitors; Angiotensin receptor blockers; Benicar [olmesartan]; Poison ivy extract [poison ivy extract]; Purell instant hand [alcohol]; and Triclosan  Family History  Problem Relation Age of Onset   Arrhythmia Mother        A-Fib   Heart failure Mother    Hyperlipidemia Mother    Hypertension Mother    Alzheimer's disease Mother    Hypertension Father    Hyperlipidemia Father    Mental retardation Father 102       Suicide   Hypertension Brother    Hyperlipidemia Brother    Hypertension Brother    Hyperlipidemia Brother    Hyperlipidemia Brother    Hypertension Brother    Alcohol abuse Brother    Depression Brother    Hyperlipidemia Brother    Hypertension Brother    Breast cancer Maternal Aunt     Social History Social History   Tobacco Use   Smoking status: Former Smoker    Packs/day: 0.50    Years: 13.00    Pack  years: 6.50    Last attempt to quit: 01/23/2000    Years since quitting: 19.2   Smokeless tobacco: Never Used  Substance Use Topics   Alcohol use: No   Drug use: No    Review of Systems Constitutional: No fever/chills Eyes: No visual changes. ENT: No sore throat. Cardiovascular: Denies chest pain. Respiratory: Denies shortness of breath. Gastrointestinal: No abdominal pain.  No nausea, no vomiting.  No diarrhea.  No constipation. Genitourinary: Negative for dysuria. Musculoskeletal: Negative for neck pain.  Negative for back pain. Integumentary: Positive for scalp laceration Neurological: Negative  for headaches, focal weakness or numbness.  ____________________________________________   PHYSICAL EXAM:  VITAL SIGNS: ED Triage Vitals  Enc Vitals Group     BP 04/09/19 0016 (!) 154/93     Pulse Rate 04/09/19 0043 71     Resp 04/09/19 0016 18     Temp 04/09/19 0043 (!) 97.5 F (36.4 C)     Temp Source 04/09/19 0016 Oral     SpO2 04/09/19 0043 100 %     Weight 04/09/19 0013 64.9 kg (143 lb)     Height 04/09/19 0013 1.702 m (5\' 7" )     Head Circumference --      Peak Flow --      Pain Score 04/09/19 0013 2     Pain Loc --      Pain Edu? --      Excl. in [place]? --     Constitutional: Alert and oriented. Well appearing and in no acute distress. Eyes: Conjunctivae are normal.  Left periorbital ecchymosis Head:    Ears:  Healthy appearing ear canals and TMs bilaterally Nose: No congestion/rhinnorhea. Mouth/Throat: Mucous membranes are moist.  Oropharynx non-erythematous. Neck: No stridor.  No cervical spine tenderness to palpation Cardiovascular: Normal rate, regular rhythm. Good peripheral circulation. Grossly normal heart sounds. Respiratory: Normal respiratory effort.  No retractions. No audible wheezing. Gastrointestinal: Soft and nontender. No distention.  {**Genitourinary:    Musculoskeletal: No lower extremity tenderness nor edema. No gross deformities of  extremities. Neurologic:  Normal speech and language. No gross focal neurologic deficits are appreciated.  Skin: Please refer to picture of the head exam regarding scalp laceration characteristics.  Approximate 32 cm laceration with Gala disruption. Psychiatric: Mood and affect are normal. Speech and behavior are normal.  __________________________  RADIOLOGY I, [place] Harleyquinn Gasser, personally viewed and evaluated these images (plain radiographs) as part of my medical decision making, as well as reviewing the written report by the radiologist.  ED MD interpretation: No acute intracranial abnormality noted on CT head large left hemispheric scalp subgaleal hematoma without skull fracture. CT cervical spine revealed no acute abnormality per radiologist.  Official radiology report(s): Dg Ribs Unilateral W/chest Left  Result Date: 04/09/2019 CLINICAL DATA:  Pain status post fall EXAM: LEFT RIBS AND CHEST - 3+ VIEW COMPARISON:  01/17/2014 FINDINGS: No fracture or other bone lesions are seen involving the ribs. There is no evidence of pneumothorax or pleural effusion. Both lungs are clear. Heart size and mediastinal contours are within normal limits. IMPRESSION: Negative. Electronically Signed   By: Constance Holster M.D.   On: 04/09/2019 02:02   Ct Head Wo Contrast  Result Date: 04/09/2019 CLINICAL DATA:  Fall down stairs EXAM: CT HEAD WITHOUT CONTRAST CT CERVICAL SPINE WITHOUT CONTRAST TECHNIQUE: Multidetector CT imaging of the head and cervical spine was performed following the standard protocol without intravenous contrast. Multiplanar CT image reconstructions of the cervical spine were also generated. COMPARISON:  None. FINDINGS: CT HEAD FINDINGS Brain: There is no mass, hemorrhage or extra-axial collection. The size and configuration of the ventricles and extra-axial CSF spaces are normal. The brain parenchyma is normal, without evidence of acute or chronic infarction. Vascular: No abnormal  hyperdensity of the major intracranial arteries or dural venous sinuses. No intracranial atherosclerosis. Skull: Massive left hemispheric scalp subgaleal hematoma measuring up to 14 mm in thickness. There are skin staples over the wound. No skull fracture. Sinuses/Orbits: No fluid levels or advanced mucosal thickening of the visualized paranasal sinuses. No mastoid or middle ear effusion. The  orbits are normal. CT CERVICAL SPINE FINDINGS Alignment: Reversal of normal cervical lordosis. No static subluxation. Skull base and vertebrae: No acute fracture. Soft tissues and spinal canal: No prevertebral fluid or swelling. No visible canal hematoma. Disc levels: Facet arthrosis is worst at left C2-4. Upper chest: No pneumothorax, pulmonary nodule or pleural effusion. Other: Normal visualized paraspinal cervical soft tissues. IMPRESSION: 1. No acute intracranial abnormality. 2. Massive left hemispheric scalp subgaleal hematoma without skull fracture. 3. No acute fracture or static subluxation of the cervical spine. Electronically Signed   By: Ulyses Jarred M.D.   On: 04/09/2019 01:59   Ct Cervical Spine Wo Contrast  Result Date: 04/09/2019 CLINICAL DATA:  Fall down stairs EXAM: CT HEAD WITHOUT CONTRAST CT CERVICAL SPINE WITHOUT CONTRAST TECHNIQUE: Multidetector CT imaging of the head and cervical spine was performed following the standard protocol without intravenous contrast. Multiplanar CT image reconstructions of the cervical spine were also generated. COMPARISON:  None. FINDINGS: CT HEAD FINDINGS Brain: There is no mass, hemorrhage or extra-axial collection. The size and configuration of the ventricles and extra-axial CSF spaces are normal. The brain parenchyma is normal, without evidence of acute or chronic infarction. Vascular: No abnormal hyperdensity of the major intracranial arteries or dural venous sinuses. No intracranial atherosclerosis. Skull: Massive left hemispheric scalp subgaleal hematoma measuring up  to 14 mm in thickness. There are skin staples over the wound. No skull fracture. Sinuses/Orbits: No fluid levels or advanced mucosal thickening of the visualized paranasal sinuses. No mastoid or middle ear effusion. The orbits are normal. CT CERVICAL SPINE FINDINGS Alignment: Reversal of normal cervical lordosis. No static subluxation. Skull base and vertebrae: No acute fracture. Soft tissues and spinal canal: No prevertebral fluid or swelling. No visible canal hematoma. Disc levels: Facet arthrosis is worst at left C2-4. Upper chest: No pneumothorax, pulmonary nodule or pleural effusion. Other: Normal visualized paraspinal cervical soft tissues. IMPRESSION: 1. No acute intracranial abnormality. 2. Massive left hemispheric scalp subgaleal hematoma without skull fracture. 3. No acute fracture or static subluxation of the cervical spine. Electronically Signed   By: Ulyses Jarred M.D.   On: 04/09/2019 01:59   Dg Foot Complete Left  Result Date: 04/09/2019 CLINICAL DATA:  Pain status post fall EXAM: LEFT FOOT - COMPLETE 3+ VIEW COMPARISON:  None. FINDINGS: There is no evidence of fracture or dislocation. There is no evidence of arthropathy or other focal bone abnormality. Soft tissues are unremarkable. IMPRESSION: Negative. Electronically Signed   By: Constance Holster M.D.   On: 04/09/2019 02:06   Dg Foot Complete Right  Result Date: 04/09/2019 CLINICAL DATA:  Pain status post fall EXAM: RIGHT FOOT COMPLETE - 3+ VIEW COMPARISON:  None. FINDINGS: There is no evidence of fracture or dislocation. There is no evidence of arthropathy or other focal bone abnormality. Soft tissues are unremarkable. IMPRESSION: Negative. Electronically Signed   By: Constance Holster M.D.   On: 04/09/2019 02:08    ____________________________________________   PROCEDURES      .Marland KitchenLaceration Repair Date/Time: 04/09/2019 5:17 AM Performed by: Gregor Hams, MD Authorized by: Gregor Hams, MD   Consent:    Consent  obtained:  Verbal   Consent given by:  Patient   Risks discussed:  Infection, pain, retained foreign body, poor cosmetic result and poor wound healing Anesthesia (see MAR for exact dosages):    Anesthesia method:  Local infiltration   Local anesthetic:  Lidocaine 1% WITH epi Laceration details:    Location:  Scalp   Scalp location:  Frontal (parietal)   Length (cm):  32 Repair type:    Repair type:  Complex Pre-procedure details:    Preparation:  Patient was prepped and draped in usual sterile fashion and imaging obtained to evaluate for foreign bodies Exploration:    Hemostasis achieved with:  Direct pressure and epinephrine   Wound exploration: wound explored through full range of motion and entire depth of wound probed and visualized     Wound extent: fascia violated     Contaminated: no   Treatment:    Area cleansed with:  Saline and Betadine   Amount of cleaning:  Extensive   Irrigation solution:  Sterile saline   Irrigation volume:  100    Irrigation method:  Pressure wash   Visualized foreign bodies/material removed: no     Debridement:  None   Undermining:  None   Scar revision: no   Fascia repair:    Suture size:  2-0   Suture material:  Vicryl   Suture technique:  Simple interrupted   Number of sutures:  10 Skin repair:    Repair method:  Staples   Number of staples:  32 Approximation:    Approximation:  Close Post-procedure details:    Dressing:  Sterile dressing   Patient tolerance of procedure:  Tolerated well, no immediate complications         ____________________________________________   INITIAL IMPRESSION / MDM / ASSESSMENT AND PLAN / ED COURSE  As part of my medical decision making, I reviewed the following data within the [place] NUMBER  68 year old female presented with above-stated history and physical exam following accidental fall with resultant scalp laceration.  Wound repaired without difficulty.   *TAMETRA AHART  was evaluated in Emergency Department on 04/09/2019 for the symptoms described in the history of present illness. She was evaluated in the context of the global COVID-19 pandemic, which necessitated consideration that the patient might be at risk for infection with the SARS-CoV-2 virus that causes COVID-19. Institutional protocols and algorithms that pertain to the evaluation of patients at risk for COVID-19 are in a state of rapid change based on information released by regulatory bodies including the CDC and federal and state organizations. These policies and algorithms were followed during the patient's care in the ED.  Some ED evaluations and interventions may be delayed as a result of limited staffing during the pandemic.*    ____________________________________________  FINAL CLINICAL IMPRESSION(S) / ED DIAGNOSES  Final diagnoses:  Laceration of scalp, initial encounter     MEDICATIONS GIVEN DURING THIS VISIT:  Medications  cephALEXin (KEFLEX) capsule 500 mg (has no administration in time range)     ED Discharge Orders    None       Note:  This document was prepared using Dragon voice recognition software and may include unintentional dictation errors.   Gregor Hams, MD 04/09/19 [phone]

## 2019-04-09 NOTE — ED Triage Notes (Addendum)
Patient ambulatory to triage with steady gait, without difficulty or distress noted; pt reports fell down steps in the dark; large amount blood to head; denies LOC but c/o HA; st several lacerations to her scalp; denies any other c/o or injuries; pt taken immed to room 18 via w/c for further evaluation

## 2019-04-09 NOTE — Telephone Encounter (Signed)
Patient needs ED follow up for Laceration to scalp please schedule 32 staples need to be removed . Left message to return call  To office.

## 2019-04-09 NOTE — ED Notes (Signed)
This writer helped patient to shower to wash hair and body. Patient tolerated with no issues with minimal bleeding.

## 2019-04-09 NOTE — Telephone Encounter (Signed)
Pt phone is not ringing due to her bleeding on the phone. Pt called to inquire if an appt was made for her and what time it will be/ advise Pt that no appt was made. Pt will call back around 8am to get on Dr. Tharon Aquas schedule

## 2019-04-09 NOTE — ED Notes (Signed)
Dr. Owens Shark at bedside suturing wound. Patient is alert and oriented, speaking easily. Patient denies LOC. Patient was abrasions on right foot and left shin.

## 2019-04-09 NOTE — Telephone Encounter (Signed)
Copied from [place] [id number]. Topic: Appointment Scheduling - Scheduling Inquiry for Clinic >> Apr 09, 2019  8:38 AM Erick Blinks wrote: Reason for CRM: Hosp FU, VM available

## 2019-04-10 ENCOUNTER — Other Ambulatory Visit: Payer: Self-pay

## 2019-04-10 NOTE — Telephone Encounter (Signed)
I have an appointment held for this patient on 04/17/19 at 10 am.

## 2019-04-10 NOTE — Telephone Encounter (Signed)
Patient scheduled Monday just to make sure everything looks ok I have held a place for Friday if that is when the staples should be removed?

## 2019-04-10 NOTE — Telephone Encounter (Signed)
I will plan to see her in clinic on Monday for follow-up.  We can determine at that time if we will be able to remove the staples in clinic.  Based on the extent of her injury it would likely be best for her to have the staples removed in the ED

## 2019-04-10 NOTE — Telephone Encounter (Signed)
Pt is scheduled for 06/08 @ 8am.

## 2019-04-13 ENCOUNTER — Ambulatory Visit (INDEPENDENT_AMBULATORY_CARE_PROVIDER_SITE_OTHER): Payer: Medicare HMO | Admitting: Family Medicine

## 2019-04-13 ENCOUNTER — Encounter: Payer: Self-pay | Admitting: Family Medicine

## 2019-04-13 ENCOUNTER — Other Ambulatory Visit: Payer: Self-pay

## 2019-04-13 VITALS — BP 118/60 | HR 85 | Temp 98.5°F | Ht 67.0 in | Wt 148.6 lb

## 2019-04-13 DIAGNOSIS — S0101XA Laceration without foreign body of scalp, initial encounter: Secondary | ICD-10-CM | POA: Diagnosis not present

## 2019-04-13 DIAGNOSIS — D649 Anemia, unspecified: Secondary | ICD-10-CM | POA: Diagnosis not present

## 2019-04-13 HISTORY — DX: Laceration without foreign body of scalp, initial encounter: S01.01XA

## 2019-04-13 NOTE — Progress Notes (Signed)
Tommi Rumps, MD Phone: [phone]  Amy Mcconnell is a 68 y.o. female who presents today for ED follow-up.  Scalp laceration: This occurred on 04/09/2019.  Patient went outside after it was dark to try to turn on the faucet and went to the wrong area and caught her head on a covering overhanging her stairs.  This created a 32 cm laceration and pulled her scalp back.  She had significant bleeding.  She notes no loss of consciousness.  She does note she had some discomfort in the area of 1 of her bruises on her head posterior last night but this has improved.  No other headaches.  No numbness, weakness, or vision changes.  No bleeding or drainage from the wound.  No fevers.  No swallowing issues.  She has been using ice packs and Tylenol for her discomfort.  Patient had not noted a subconjunctival hemorrhage.  Her CT head did not reveal any intracranial abnormalities.  It did reveal a massive left hemispheric scalp subgaleal hematoma measuring up to 14 mm in thickness.  No apparent skull fracture.  CT cervical spine with no acute fracture or static subluxation.  Left rib x-ray with chest with no acute findings.  Left foot and right foot x-rays negative.  ED physician note reviewed.  Anemia: Found to be anemic after her scalp laceration.  Likely related to bleeding.  Social History   Tobacco Use  Smoking Status Former Smoker  . Packs/day: 0.50  . Years: 13.00  . Pack years: 6.50  . Last attempt to quit: 01/23/2000  . Years since quitting: 19.2  Smokeless Tobacco Never Used     ROS see history of present illness  Objective  Physical Exam Vitals:   04/13/19 1432  BP: 118/60  Pulse: 85  Temp: 98.5 F (36.9 C)  SpO2: 96%    BP Readings from Last 3 Encounters:  04/13/19 118/60  04/09/19 122/70  11/07/18 110/64   Wt Readings from Last 3 Encounters:  04/13/19 148 lb 9.6 oz (67.4 kg)  04/09/19 143 lb (64.9 kg)  11/07/18 144 lb 4 oz (65.4 kg)    Physical Exam  Constitutional:      General: She is not in acute distress.    Appearance: She is not diaphoretic.  HENT:     Head:      Comments: Lengthy laceration on her scalp as outlined in the picture above and as documented in the ED physician note from 04/09/2019, this appears to be healing well with staples in place, there is no drainage, there is no surrounding tenderness, there is no erythema, there is no swelling in the area of the laceration, there is no warmth, there is bruising especially over the left side of her scalp and posterior to bilateral ears and around her bilateral eyes sparing nose, she does appear to have some yellowing and bruising down into her neck and upper chest though this does not extend into her arms, no hemotympanum Eyes:     Pupils: Pupils are equal, round, and reactive to light.     Comments: Left subconjunctival hemorrhage noted, otherwise no gross corneal abnormalities  Cardiovascular:     Rate and Rhythm: Normal rate and regular rhythm.     Heart sounds: Normal heart sounds.  Pulmonary:     Effort: Pulmonary effort is normal.     Breath sounds: Normal breath sounds.  Skin:    General: Skin is warm and dry.  Neurological:     Mental Status: She  is alert.     Comments: CN 2-12 intact, 5/5 strength in bilateral biceps, triceps, grip, quads, hamstrings, plantar and dorsiflexion, sensation to light touch intact in bilateral UE and LE, normal gait      Assessment/Plan: Please see individual problem list.  Scalp laceration Patient with an extensive scalp laceration with associated subgaleal hematoma.  She appears to be healing quite well.  Her laceration with staples does not reveal any signs of infection.  She does have extensive bruising though no significant other symptoms.  Subconjunctival hemorrhage noted and her vision was intact on vision screening.  She appears to be doing well at this time and she will continue to monitor with Tylenol as pain control per her  request.  Given the extent of her laceration and injury I discussed with her that I would not feel comfortable removing the staples in our office due to potential for complication and risk of being unable to treat a complication if it arose.  I discussed that she could have these removed in the emergency department since they were placed there.  I also discussed that if she did not feel comfortable going back to the emergency department that we could refer her to a surgeon as well.  The patient felt comfortable going back to the ED to have these removed and I advised her to go on 04/16/2019 to have these removed as that would be 7 days from her injury.  I discussed that if she develops any complications or signs of infection that she needs to be evaluated immediately.  She is given return precautions.  Anemia Anemia is likely related to the bleed from her injury.  We will recheck this today.   Orders Placed This Encounter  Procedures  . CBC    No orders of the defined types were placed in this encounter.    Tommi Rumps, MD [place]

## 2019-04-13 NOTE — Assessment & Plan Note (Signed)
Patient with an extensive scalp laceration with associated subgaleal hematoma.  She appears to be healing quite well.  Her laceration with staples does not reveal any signs of infection.  She does have extensive bruising though no significant other symptoms.  Subconjunctival hemorrhage noted and her vision was intact on vision screening.  She appears to be doing well at this time and she will continue to monitor with Tylenol as pain control per her request.  Given the extent of her laceration and injury I discussed with her that I would not feel comfortable removing the staples in our office due to potential for complication and risk of being unable to treat a complication if it arose.  I discussed that she could have these removed in the emergency department since they were placed there.  I also discussed that if she did not feel comfortable going back to the emergency department that we could refer her to a surgeon as well.  The patient felt comfortable going back to the ED to have these removed and I advised her to go on 04/16/2019 to have these removed as that would be 7 days from her injury.  I discussed that if she develops any complications or signs of infection that she needs to be evaluated immediately.  She is given return precautions.

## 2019-04-13 NOTE — Patient Instructions (Signed)
Nice to see you. I am glad you are doing well. As we discussed you can go back to the emergency department on Thursday for staple removal.  Please ensure that you do this.  If you change your mind and want this done elsewhere please let us know as soon as you do change your mind so that we can get you referred. If you develop fevers, drainage from the wound, severe headaches, numbness, weakness, or vision changes please go to the emergency room immediately.

## 2019-04-13 NOTE — Assessment & Plan Note (Signed)
Anemia is likely related to the bleed from her injury.  We will recheck this today.

## 2019-04-14 ENCOUNTER — Encounter: Payer: Self-pay | Admitting: *Deleted

## 2019-04-14 ENCOUNTER — Other Ambulatory Visit
Admission: RE | Admit: 2019-04-14 | Discharge: 2019-04-14 | Disposition: A | Discharge status: Home / Self Care | Payer: Medicare HMO | Source: Ambulatory Visit | Attending: Family Medicine | Admitting: Family Medicine

## 2019-04-14 ENCOUNTER — Telehealth: Payer: Self-pay

## 2019-04-14 ENCOUNTER — Emergency Department: Payer: Medicare HMO

## 2019-04-14 ENCOUNTER — Telehealth: Payer: Self-pay | Admitting: Family Medicine

## 2019-04-14 ENCOUNTER — Emergency Department
Admission: EM | Admit: 2019-04-14 | Discharge: 2019-04-14 | Disposition: A | Discharge status: Home / Self Care | Payer: Medicare HMO | Attending: Student in an Organized Health Care Education/Training Program | Admitting: Student in an Organized Health Care Education/Training Program

## 2019-04-14 ENCOUNTER — Other Ambulatory Visit: Payer: Self-pay

## 2019-04-14 DIAGNOSIS — R531 Weakness: Secondary | ICD-10-CM | POA: Diagnosis not present

## 2019-04-14 DIAGNOSIS — R0600 Dyspnea, unspecified: Secondary | ICD-10-CM | POA: Diagnosis not present

## 2019-04-14 DIAGNOSIS — S0003XA Contusion of scalp, initial encounter: Secondary | ICD-10-CM | POA: Diagnosis not present

## 2019-04-14 DIAGNOSIS — R51 Headache: Secondary | ICD-10-CM | POA: Diagnosis not present

## 2019-04-14 DIAGNOSIS — D649 Anemia, unspecified: Secondary | ICD-10-CM

## 2019-04-14 DIAGNOSIS — Z87891 Personal history of nicotine dependence: Secondary | ICD-10-CM | POA: Diagnosis not present

## 2019-04-14 DIAGNOSIS — S0003XD Contusion of scalp, subsequent encounter: Secondary | ICD-10-CM | POA: Diagnosis not present

## 2019-04-14 DIAGNOSIS — I10 Essential (primary) hypertension: Secondary | ICD-10-CM | POA: Diagnosis not present

## 2019-04-14 DIAGNOSIS — Z79899 Other long term (current) drug therapy: Secondary | ICD-10-CM | POA: Insufficient documentation

## 2019-04-14 DIAGNOSIS — R220 Localized swelling, mass and lump, head: Secondary | ICD-10-CM | POA: Insufficient documentation

## 2019-04-14 DIAGNOSIS — Z7982 Long term (current) use of aspirin: Secondary | ICD-10-CM | POA: Insufficient documentation

## 2019-04-14 DIAGNOSIS — W19XXXD Unspecified fall, subsequent encounter: Secondary | ICD-10-CM | POA: Insufficient documentation

## 2019-04-14 DIAGNOSIS — E039 Hypothyroidism, unspecified: Secondary | ICD-10-CM | POA: Diagnosis not present

## 2019-04-14 LAB — CBC
HCT: 25 % — ABNORMAL LOW (ref 36.0–46.0)
HCT: 24.5 % — ABNORMAL LOW (ref 36.0–46.0)
Hemoglobin: 8 g/dL — CL (ref 12.0–15.0)
Hemoglobin: 7.6 g/dL — ABNORMAL LOW (ref 12.0–15.0)
MCH: 28.5 pg (ref 26.0–34.0)
MCHC: 32.1 g/dL (ref 30.0–36.0)
MCHC: 31 g/dL (ref 30.0–36.0)
MCV: 88.3 fl (ref 78.0–100.0)
MCV: 91.8 fL (ref 80.0–100.0)
Platelets: 196 10*3/uL (ref 150.0–400.0)
Platelets: 190 10*3/uL (ref 150–400)
RBC: 2.83 Mil/uL — ABNORMAL LOW (ref 3.87–5.11)
RBC: 2.67 MIL/uL — ABNORMAL LOW (ref 3.87–5.11)
RDW: 14.4 % (ref 11.5–15.5)
RDW: 14.2 % (ref 11.5–15.5)
WBC: 12.3 10*3/uL — ABNORMAL HIGH (ref 4.0–10.5)
WBC: 11.4 10*3/uL — ABNORMAL HIGH (ref 4.0–10.5)
nRBC: 0 % (ref 0.0–0.2)

## 2019-04-14 LAB — CBC WITH DIFFERENTIAL/PLATELET
Abs Immature Granulocytes: 0.07 10*3/uL (ref 0.00–0.07)
Basophils Absolute: 0 10*3/uL (ref 0.0–0.1)
Basophils Relative: 0 %
Eosinophils Absolute: 0 10*3/uL (ref 0.0–0.5)
Eosinophils Relative: 0 %
HCT: 24.4 % — ABNORMAL LOW (ref 36.0–46.0)
Hemoglobin: 7.5 g/dL — ABNORMAL LOW (ref 12.0–15.0)
Immature Granulocytes: 1 %
Lymphocytes Relative: 13 %
Lymphs Abs: 1.5 10*3/uL (ref 0.7–4.0)
MCH: 28.1 pg (ref 26.0–34.0)
MCHC: 30.7 g/dL (ref 30.0–36.0)
MCV: 91.4 fL (ref 80.0–100.0)
Monocytes Absolute: 1.3 10*3/uL — ABNORMAL HIGH (ref 0.1–1.0)
Monocytes Relative: 11 %
Neutro Abs: 8.9 10*3/uL — ABNORMAL HIGH (ref 1.7–7.7)
Neutrophils Relative %: 75 %
Platelets: 211 10*3/uL (ref 150–400)
RBC: 2.67 MIL/uL — ABNORMAL LOW (ref 3.87–5.11)
RDW: 14.4 % (ref 11.5–15.5)
WBC: 11.8 10*3/uL — ABNORMAL HIGH (ref 4.0–10.5)
nRBC: 0 % (ref 0.0–0.2)

## 2019-04-14 LAB — COMPREHENSIVE METABOLIC PANEL
ALT: 72 U/L — ABNORMAL HIGH (ref 0–44)
AST: 54 U/L — ABNORMAL HIGH (ref 15–41)
Albumin: 3.9 g/dL (ref 3.5–5.0)
Alkaline Phosphatase: 86 U/L (ref 38–126)
Anion gap: 10 (ref 5–15)
BUN: 15 mg/dL (ref 8–23)
CO2: 22 mmol/L (ref 22–32)
Calcium: 9.1 mg/dL (ref 8.9–10.3)
Chloride: 105 mmol/L (ref 98–111)
Creatinine, Ser: 0.76 mg/dL (ref 0.44–1.00)
GFR calc Af Amer: >60 mL/min (ref >60)
GFR calc non Af Amer: >60 mL/min (ref >60)
Glucose, Bld: 111 mg/dL — ABNORMAL HIGH (ref 70–99)
Potassium: 4.2 mmol/L (ref 3.5–5.1)
Sodium: 137 mmol/L (ref 135–145)
Total Bilirubin: 1.1 mg/dL (ref 0.3–1.2)
Total Protein: 7.4 g/dL (ref 6.5–8.1)

## 2019-04-14 LAB — PREPARE RBC (CROSSMATCH)

## 2019-04-14 MED ORDER — ACETAMINOPHEN 325 MG PO TABS
650.0000 mg | ORAL_TABLET | Freq: Once | ORAL | Status: AC
  Administered 2019-04-14: 18:00:00 650 mg via ORAL
  Filled 2019-04-14: qty 2

## 2019-04-14 MED ORDER — IOHEXOL 300 MG/ML  SOLN
75.0000 mL | Freq: Once | INTRAMUSCULAR | Status: AC | PRN
  Administered 2019-04-14: 75 mL via INTRAVENOUS

## 2019-04-14 MED ORDER — SODIUM CHLORIDE 0.9 % IV SOLN: 10.0000 mL/h | Freq: Once | INTRAVENOUS | Status: DC

## 2019-04-14 MED ORDER — BACITRACIN ZINC 500 UNIT/GM EX OINT
TOPICAL_OINTMENT | Freq: Once | CUTANEOUS | Status: DC
  Filled 2019-04-14: qty 1.8

## 2019-04-14 NOTE — ED Provider Notes (Signed)
[facility] Emergency Department Provider Note    First MD Initiated Contact with Patient 04/14/19 1609     (approximate)  I have reviewed the triage vital signs and the nursing notes.   HISTORY  Chief Complaint Anemia    HPI Amy Mcconnell is a 68 y.o. female recent evaluation in the ER for fall with scalp wound.  presents to ER today due to  decreased hemoglobin.  Denies any melena or hematochezia.  She is not any blood thinners.  Does feel that she is got severe swelling to the top of her head.  Planes of mild to moderate headache.  Is not had any bleeding from the laceration.  No fevers.  Does feel some exertional dyspnea and weakness.  Is uncertain as to when she supposed to have her staples removed.  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 Thurmond Butts - every 3 months   Broken foot    left    Contact dermatitis and other eczema due to other specified agent    Contusion of unspecified part of lower limb    Essential hypertension, benign    Heart murmur    Other and unspecified hyperlipidemia    Other specified disorders of thyroid    Reflux esophagitis    Tachycardia    a. isolated episode, seen in ED with negative work up   Family History  Problem Relation Age of Onset   Arrhythmia Mother        A-Fib   Heart failure Mother    Hyperlipidemia Mother    Hypertension Mother    Alzheimer's disease Mother    Hypertension Father    Hyperlipidemia Father    Mental retardation Father 4       Suicide   Hypertension Brother    Hyperlipidemia Brother    Hypertension Brother    Hyperlipidemia Brother    Hyperlipidemia Brother    Hypertension Brother    Alcohol abuse Brother    Depression Brother    Hyperlipidemia Brother    Hypertension Brother    Breast cancer Maternal Aunt    Past Surgical History:  Procedure Laterality  Date   CATARACT EXTRACTION     SALPINGECTOMY Left    STERILIZATION     Her decision since dz of bipolar   Patient Active Problem List   Diagnosis Date Noted   Scalp laceration 04/13/2019   Anemia 04/13/2019   Hypothyroidism 02/28/2019   Paroxysmal tachycardia ([place]) 10/14/2018   Stress incontinence 04/01/2016   Bradycardia 08/19/2015   Bipolar disorder ([place]) 03/30/2015   Anxiety    Essential hypertension 10/06/2014   Left foot pain 08/20/2014   Avulsion fracture of calcaneus 08/20/2014   Chest pain with moderate risk for cardiac etiology 07/23/2014   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 Authorizing Provider  acetaminophen (TYLENOL) 500 MG tablet Take 1,000 mg by mouth every 6 (six) hours as needed.    [provider]  amLODipine (NORVASC) 5 MG tablet TAKE 1 TABLET BY MOUTH ONCE DAILY 10/30/18   Minna Merritts, MD  Ascorbic Acid (VITAMIN C) 1000 MG tablet Take 1,000 mg by mouth daily.    [provider]  aspirin 81 MG  tablet Take 81 mg by mouth daily.    [provider]  atorvastatin (LIPITOR) 80 MG tablet Take 1 tablet by mouth once daily 01/30/19   Minna Merritts, MD  B Complex Vitamins (VITAMIN B COMPLEX PO) Take by mouth daily.    [provider]  calcium carbonate (OS-CAL) 600 MG TABS tablet Take 600 mg by mouth 2 (two) times daily with a meal.     [provider]  cephALEXin (KEFLEX) 500 MG capsule Take 1 capsule (500 mg total) by mouth 2 (two) times daily for 10 days. 04/09/19 04/19/19  Gregor Hams, MD  Cholecalciferol (VITAMIN D3) 50 MCG (2000 UT) capsule Take 2,000 Units by mouth daily.    [provider]  diphenhydrAMINE (BENADRYL) 25 mg capsule Take 25 mg by mouth daily as needed for allergies. For food and fragrance sensitivities     [provider]  doxazosin (CARDURA) 8 MG tablet Take 1 tablet by mouth twice daily 01/30/19   Minna Merritts, MD  EPINEPHrine 0.3 mg/0.3 mL IJ SOAJ injection Inject 0.3 mLs (0.3 mg total) into the muscle once. 03/30/16   Leone Haven, MD  fexofenadine (ALLEGRA) 180 MG tablet Take 180 mg by mouth. 2-3 times daily prn For food and fragrance sensitivities/intolerances    [provider]  Flaxseed, Linseed, (FLAXSEED OIL PO) Take by mouth.    [provider]  levothyroxine (SYNTHROID, LEVOTHROID) 125 MCG tablet Take 125 mcg by mouth daily before breakfast.    [provider]  lithium carbonate 150 MG capsule Take 150 mg by mouth 2 (two) times daily with a meal.    [provider]  magnesium oxide (MAG-OX) 400 MG tablet Take 400 mg by mouth daily.    [provider]  Multiple Vitamins-Minerals (MULTIVITAMIN WITH MINERALS) tablet Take 1 tablet by mouth daily.    [provider]  Multiple Vitamins-Minerals (VISION FORMULA/LUTEIN) TABS Take by mouth.    [provider]  Omega-3 Fatty Acids (FISH OIL) 1000 MG CAPS Take by mouth 2 (two) times daily.     [provider]  Oxcarbazepine (TRILEPTAL) 300 MG tablet Take 300 mg one tablet in the am & two tablets in the pm.    [provider]  propranolol ER (INDERAL LA) 60 MG 24 hr capsule TAKE 1 CAPSULE BY MOUTH ONCE DAILY 10/17/18   Minna Merritts, MD  Tdap (BOOSTRIX) 5-2.5-18.5 LF-MCG/0.5 injection Inject 0.5 mLs into the muscle once. 12/29/14   Crecencio Mc, MD  vitamin B-12 (CYANOCOBALAMIN) 1000 MCG tablet Take 1,000 mcg by mouth daily.    [provider]  vitamin E 1000 UNIT capsule Take 1,000 Units by mouth daily.    [provider]  zinc gluconate 50 MG tablet Take 50 mg by mouth daily.    [provider]  ziprasidone (GEODON) 60 MG capsule Take 60 mg by mouth daily.    [provider]    Allergies Ace inhibitors;  Angiotensin receptor blockers; Benicar [olmesartan]; Poison ivy extract [poison ivy extract]; Purell instant hand [alcohol]; and Triclosan    Social History Social History   Tobacco Use   Smoking status: Former Smoker    Packs/day: 0.50    Years: 13.00    Pack years: 6.50    Last attempt to quit: 01/23/2000    Years since quitting: 19.2   Smokeless tobacco: Never Used  Substance Use Topics   Alcohol use: No   Drug use: No  Review of Systems Patient denies headaches, rhinorrhea, blurry vision, numbness, shortness of breath, chest pain, edema, cough, abdominal pain, nausea, vomiting, diarrhea, dysuria, fevers, rashes or hallucinations unless otherwise stated above in HPI. ____________________________________________   PHYSICAL EXAM:  VITAL SIGNS: Vitals:   04/14/19 1845 04/14/19 1859  BP: 118/70 108/63  Pulse: 77 68  Resp: 18 (!) 22  Temp: 99.4 F (37.4 C) 98.9 F (37.2 C)  SpO2: 100% 100%    Constitutional: Alert and oriented.  Eyes: Conjunctivae are normal.  Head: Large scalp laceration now repaired as previously described.  Does have large left-sided scalp hematoma and ecchymosis with dependent bruising.  No warmth purulence or cellulitic changes. Nose: No congestion/rhinnorhea. Mouth/Throat: Mucous membranes are moist.   Neck: No stridor. Painless ROM.  Cardiovascular: Normal rate, regular rhythm. Grossly normal heart sounds.  Good peripheral circulation. Respiratory: Normal respiratory effort.  No retractions. Lungs CTAB. Gastrointestinal: Soft and nontender. No distention. No abdominal bruits. No CVA tenderness. Genitourinary:  Musculoskeletal: No lower extremity tenderness nor edema.  No joint effusions. Neurologic:  Normal speech and language. No gross focal neurologic deficits are appreciated. No facial droop Skin:  Skin is warm, dry and intact. No rash noted. Psychiatric: Mood and affect are normal. Speech and behavior are  normal.  ____________________________________________   LABS (all labs ordered are listed, but only abnormal results are displayed)  Results for orders placed or performed during the hospital encounter of 04/14/19 (from the past 24 hour(s))  CBC with Differential/Platelet     Status: Abnormal   Collection Time: 04/14/19  4:31 PM  Result Value Ref Range   WBC 11.8 (H) 4.0 - 10.5 K/uL   RBC 2.67 (L) 3.87 - 5.11 MIL/uL   Hemoglobin 7.5 (L) 12.0 - 15.0 g/dL   HCT 24.4 (L) 36.0 - 46.0 %   MCV 91.4 80.0 - 100.0 fL   MCH 28.1 26.0 - 34.0 pg   MCHC 30.7 30.0 - 36.0 g/dL   RDW 14.4 11.5 - 15.5 %   Platelets 211 150 - 400 K/uL   nRBC 0.0 0.0 - 0.2 %   Neutrophils Relative % 75 %   Neutro Abs 8.9 (H) 1.7 - 7.7 K/uL   Lymphocytes Relative 13 %   Lymphs Abs 1.5 0.7 - 4.0 K/uL   Monocytes Relative 11 %   Monocytes Absolute 1.3 (H) 0.1 - 1.0 K/uL   Eosinophils Relative 0 %   Eosinophils Absolute 0.0 0.0 - 0.5 K/uL   Basophils Relative 0 %   Basophils Absolute 0.0 0.0 - 0.1 K/uL   Immature Granulocytes 1 %   Abs Immature Granulocytes 0.07 0.00 - 0.07 K/uL  Type and screen [facility]     Status: None (Preliminary result)   Collection Time: 04/14/19  4:31 PM  Result Value Ref Range   ABO/RH(D) A POS    Antibody Screen NEG    Sample Expiration 04/17/2019,2359    Unit Number [id number]    Blood Component Type RED CELLS,LR    Unit division 00    Status of Unit ISSUED    Transfusion Status OK TO TRANSFUSE    Crossmatch Result      Compatible Performed at [facility], [address] [place], [place] [address]   Comprehensive metabolic panel     Status: Abnormal   Collection Time: 04/14/19  4:31 PM  Result Value Ref Range   Sodium 137 135 - 145 mmol/L   Potassium 4.2 3.5 - 5.1 mmol/L   Chloride  105 98 - 111 mmol/L   CO2 22 22 - 32 mmol/L   Glucose, Bld 111 (H) 70 - 99 mg/dL   BUN 15 8 - 23 mg/dL   Creatinine, Ser 0.76 0.44 - 1.00 mg/dL   Calcium  9.1 8.9 - 10.3 mg/dL   Total Protein 7.4 6.5 - 8.1 g/dL   Albumin 3.9 3.5 - 5.0 g/dL   AST 54 (H) 15 - 41 U/L   ALT 72 (H) 0 - 44 U/L   Alkaline Phosphatase 86 38 - 126 U/L   Total Bilirubin 1.1 0.3 - 1.2 mg/dL   GFR calc non Af Amer >60 >60 mL/min   GFR calc Af Amer >60 >60 mL/min   Anion gap 10 5 - 15  Prepare RBC     Status: None   Collection Time: 04/14/19  5:00 PM  Result Value Ref Range   Order Confirmation      ORDER PROCESSED BY BLOOD BANK Performed at [facility], [address], [place], [place] [address]    ____________________________________________ ____________________________________________  RADIOLOGY  I personally reviewed all radiographic images ordered to evaluate for the above acute complaints and reviewed radiology reports and findings.  These findings were personally discussed with the patient.  Please see medical record for radiology report.  ____________________________________________   PROCEDURES  Procedure(s) performed:  Procedures    Critical Care performed: no ____________________________________________   INITIAL IMPRESSION / ASSESSMENT AND PLAN / ED COURSE  Pertinent labs & imaging results that were available during my care of the patient were reviewed by me and considered in my medical decision making (see chart for details).   DDX: Somatic anemia, active extravasation, hematoma, blood loss anemia subdural hematoma.  Amy Mcconnell is a 68 y.o. who presents to the ED with symptoms as described above.  She is afebrile hemodynamically stable but describing symptoms of symptomatic anemia.  Repeat CT imaging will be obtained to evaluate for any evidence of persistent bleeding or active extract.  Phyllis blood loss is likely more secondary to initial injury with blood loss at that site and subsequent development of hematoma.  I do not see any evidence of active hemorrhage.  Clinical Course as of Apr 14 1955  Tue Apr 14, 2019   1840 Patient reassessed.  We discussed the results of her CT head.  Does have evidence of large hematoma worsened as compared to previous but no evidence of active extravasation.  There is no evidence of overlying cellulitis or infection at this time.  Given her symptomatic anemia with blood loss from this trauma will give 1 unit.  We discussed the importance of wound care and will be given referral to wound clinic.   [PR]    Clinical Course User Index [PR] Merlyn Lot, MD   The patient was evaluated in Emergency Department today for the symptoms described in the history of present illness. He/she was evaluated in the context of the global COVID-19 pandemic, which necessitated consideration that the patient might be at risk for infection with the SARS-CoV-2 virus that causes COVID-19. Institutional protocols and algorithms that pertain to the evaluation of patients at risk for COVID-19 are in a state of rapid change based on information released by regulatory bodies including the CDC and federal and state organizations. These policies and algorithms were followed during the patient's care in the ED.   As part of my medical decision making, I reviewed the following data within the [place] notes reviewed  and incorporated, Labs reviewed, notes from prior ED visits and [place] Controlled Substance Database   ____________________________________________   FINAL CLINICAL IMPRESSION(S) / ED DIAGNOSES  Final diagnoses:  Scalp hematoma, subsequent encounter  Symptomatic anemia      NEW MEDICATIONS STARTED DURING THIS VISIT:  New Prescriptions   No medications on file     Note:  This document was prepared using Dragon voice recognition software and may include unintentional dictation errors.    Merlyn Lot, MD 04/14/19 [id number]

## 2019-04-14 NOTE — Telephone Encounter (Signed)
Noted. Ordered. We will see what her repeat value is. If it has dropped she will need to go to the ED for evaluation. If it has trended up and she remains asymptomatic we will continue to monitor.

## 2019-04-14 NOTE — Discharge Instructions (Signed)
I recommend you purchase a iron supplement at the pharmacy.  Should use bacitracin on the wound twice daily.  Return for any fevers worsening pain bleeding or for any additional questions or concerns.

## 2019-04-14 NOTE — Telephone Encounter (Signed)
Copied from [place] [phone]. Topic: General - Inquiry >> Apr 14, 2019  2:02 PM Virl Axe D wrote: Reason for CRM: Sharyn Lull with the lab at [facility] stated pt is at the hospital currently however they do not have orders for her. Asking for a callback. No answer on FC line. [phone]

## 2019-04-14 NOTE — ED Provider Notes (Signed)
Vitals:   04/14/19 2200 04/14/19 2215  BP: 114/89 (!) 112/55  Pulse: 83   Resp: (!) 24 20  Temp:    SpO2: 95%     Patient transfusion completed.  She is resting comfortably, reports she would like to go home.  She appears well.  No shortness of breath or chest pain.  Fully alert and oriented.  Return precautions and treatment recommendations and follow-up discussed with the patient who is agreeable with the plan.    Delman Kitten, MD 04/14/19 2250

## 2019-04-14 NOTE — Telephone Encounter (Signed)
Please contact the patient and let her know that her hemoglobin has dropped further after discharge from the ED. It is now 8.0. Please see if she is having chest pain, shortness of breath, fatigue, light headedness, or palpitations. If she is having any of those symptoms she needs to go to the ED and should have someone drive her to the ED. If she is not having any of those symptoms we will need to recheck her hemoglobin today. The quickest way to have that done would be to have her go to the lab at the hospital.

## 2019-04-14 NOTE — ED Triage Notes (Addendum)
Pt sent for lab due to hgb of 7.6. Blood work drawn today. Pt bruised from a fall recently but denies dark stool or coffee ground emesis.   Pt had type and screen done on the 4th. Results in EPIC

## 2019-04-14 NOTE — Telephone Encounter (Signed)
Labs have been drawn and results have been posted in patient's chart.

## 2019-04-14 NOTE — Telephone Encounter (Signed)
Called and spoke to pt.  Pt said that she has some weakness and "swimmy headedness" but feels it is due to bending over to clean her floor today.  Pt denies having any other symptoms.  Pt agrees to go to hospital labs to get her hemoglobin rechecked today.

## 2019-04-15 ENCOUNTER — Telehealth: Payer: Self-pay | Admitting: Family Medicine

## 2019-04-15 DIAGNOSIS — D649 Anemia, unspecified: Secondary | ICD-10-CM

## 2019-04-15 DIAGNOSIS — S0101XA Laceration without foreign body of scalp, initial encounter: Secondary | ICD-10-CM

## 2019-04-15 LAB — TYPE AND SCREEN
ABO/RH(D): A POS
Antibody Screen: NEGATIVE
Unit division: 0

## 2019-04-15 LAB — BPAM RBC
Blood Product Expiration Date: 202006182359
ISSUE DATE / TIME: 202006091824
Unit Type and Rh: 6200

## 2019-04-15 NOTE — Telephone Encounter (Signed)
The patients brother is requesting a letter to excuse him from work for 6.11.20-6.14.20 and returning to work on 6.15.20. He stated that his sister can't take care of herself and she needs his assistance. I will e-mail the letter to him. [email or web address]

## 2019-04-15 NOTE — Telephone Encounter (Signed)
Copied from [place] [phone]. Topic: Referral - Request for Referral >> Apr 15, 2019 10:19 AM Leward Quan A wrote: Has patient seen PCP for this complaint? Yes.   *If NO, is insurance requiring patient see PCP for this issue before PCP can refer them? Referral for which specialty: [facility] Preferred provider/office: not sure Reason for referral: Abrasion on left side of face  Patient would like a call when referral have been sent

## 2019-04-15 NOTE — Telephone Encounter (Signed)
Letter has been e-mailed to her brother.

## 2019-04-15 NOTE — Telephone Encounter (Signed)
I think that should be fine for her to have her staples out on Monday when she see's them. Thanks.

## 2019-04-15 NOTE — Telephone Encounter (Signed)
Referral placed.  I will send this to [facility] and discussed with her.  We will see if she can get the patient in tomorrow to be seen as she does need her staples removed tomorrow.  If she is not able to be seen she will have to go back to the emergency room to have her staples removed

## 2019-04-15 NOTE — Telephone Encounter (Signed)
Called [facility].They have her scheduled for Monday June 15. They did not have anything available this week.

## 2019-04-15 NOTE — Telephone Encounter (Signed)
Letter created.  I have placed this on Amy Mcconnell's desk.  Please make sure [name] gets this letter to send to the patient's brother.  She additionally will need a lab appointment to recheck her hemoglobin given that she received a blood transfusion in the ED and was discharged home.  Please get her scheduled for labs to be completed on Friday.  Orders have been placed.  Thanks.

## 2019-04-17 ENCOUNTER — Ambulatory Visit: Payer: Medicare HMO | Admitting: Family Medicine

## 2019-04-17 ENCOUNTER — Other Ambulatory Visit
Admission: RE | Admit: 2019-04-17 | Discharge: 2019-04-17 | Disposition: A | Discharge status: Home / Self Care | Payer: Medicare HMO | Source: Ambulatory Visit | Attending: Family Medicine | Admitting: Family Medicine

## 2019-04-17 ENCOUNTER — Telehealth: Payer: Self-pay

## 2019-04-17 ENCOUNTER — Other Ambulatory Visit: Payer: Self-pay | Admitting: Family Medicine

## 2019-04-17 DIAGNOSIS — D649 Anemia, unspecified: Secondary | ICD-10-CM | POA: Insufficient documentation

## 2019-04-17 LAB — CBC
HCT: 29.8 % — ABNORMAL LOW (ref 36.0–46.0)
Hemoglobin: 9.5 g/dL — ABNORMAL LOW (ref 12.0–15.0)
MCH: 28.1 pg (ref 26.0–34.0)
MCHC: 31.9 g/dL (ref 30.0–36.0)
MCV: 88.2 fL (ref 80.0–100.0)
Platelets: 330 10*3/uL (ref 150–400)
RBC: 3.38 MIL/uL — ABNORMAL LOW (ref 3.87–5.11)
RDW: 14.6 % (ref 11.5–15.5)
WBC: 14.5 10*3/uL — ABNORMAL HIGH (ref 4.0–10.5)
nRBC: 0 % (ref 0.0–0.2)

## 2019-04-17 NOTE — Telephone Encounter (Signed)
Called and spoke to pt.  Pt stated that she was not feeling up to going out today and wanted to know if she could wait to get her labs checked on Monday when she goes to her appt at [facility].  Checked lab schedule no appts available until Tuesday.  Consulted with PCP.  PCP ok with patient waiting until Tuesday to do labs.  Advised pt to go to ED if symptoms worsen over weekend per PCP.  Pt scheduled a lab appt on 04/21/19 @ 2:30 pm.

## 2019-04-17 NOTE — Telephone Encounter (Signed)
Can you contact the patient and get her in for lab work this afternoon? If she can't do labs this afternoon could you see if she would be willing to go to the medical mall at the hospital for labs on Saturday? Thanks.

## 2019-04-17 NOTE — Telephone Encounter (Signed)
Patient called back stating that she was at the hospital getting her labs drawn.  Pt requested for her upcoming lab appt on 04/21/19 to be cancelled.  Lab appt cancelled and PCP made aware that pt was at hospital getting labs drawn.

## 2019-04-20 ENCOUNTER — Encounter: Payer: Medicare HMO | Attending: Physician Assistant | Admitting: Physician Assistant

## 2019-04-20 ENCOUNTER — Telehealth: Payer: Self-pay

## 2019-04-20 ENCOUNTER — Other Ambulatory Visit: Payer: Self-pay

## 2019-04-20 DIAGNOSIS — S0101XA Laceration without foreign body of scalp, initial encounter: Secondary | ICD-10-CM | POA: Diagnosis not present

## 2019-04-20 DIAGNOSIS — I252 Old myocardial infarction: Secondary | ICD-10-CM | POA: Insufficient documentation

## 2019-04-20 DIAGNOSIS — Z8249 Family history of ischemic heart disease and other diseases of the circulatory system: Secondary | ICD-10-CM | POA: Diagnosis not present

## 2019-04-20 DIAGNOSIS — I10 Essential (primary) hypertension: Secondary | ICD-10-CM | POA: Diagnosis not present

## 2019-04-20 DIAGNOSIS — F3189 Other bipolar disorder: Secondary | ICD-10-CM | POA: Diagnosis not present

## 2019-04-20 DIAGNOSIS — F319 Bipolar disorder, unspecified: Secondary | ICD-10-CM | POA: Diagnosis not present

## 2019-04-20 DIAGNOSIS — E039 Hypothyroidism, unspecified: Secondary | ICD-10-CM | POA: Diagnosis not present

## 2019-04-20 DIAGNOSIS — Z87891 Personal history of nicotine dependence: Secondary | ICD-10-CM | POA: Diagnosis not present

## 2019-04-20 DIAGNOSIS — X58XXXA Exposure to other specified factors, initial encounter: Secondary | ICD-10-CM | POA: Insufficient documentation

## 2019-04-20 DIAGNOSIS — Z8349 Family history of other endocrine, nutritional and metabolic diseases: Secondary | ICD-10-CM | POA: Insufficient documentation

## 2019-04-20 DIAGNOSIS — S0180XA Unspecified open wound of other part of head, initial encounter: Secondary | ICD-10-CM | POA: Diagnosis not present

## 2019-04-20 NOTE — Telephone Encounter (Signed)
Copied from [place] [phone]. Topic: General - Other >> Apr 17, 2019  2:54 PM Yvette Rack wrote: Reason for CRM: Pt called back to apologize for being cranky when speaking with Butch Penny. Pt stated she's on her way to the lab now

## 2019-04-20 NOTE — Progress Notes (Signed)
Amy Mcconnell, Amy Mcconnell ([id number]) Visit Report for 04/20/2019 Abuse/Suicide Risk Screen Details Patient Name: Amy Mcconnell, Amy Mcconnell Date of Service: 04/20/2019 9:30 AM Medical Record Number: [id number] Patient Account Number: [id number] Date of Birth/Sex: [birth year] (67 y.o. Female) Treating RN: Cornell Barman Primary Care Docia Klar: Caryl Bis, ERIC Other Clinician: Referring Genni Buske: Merlyn Lot Treating Donn Wilmot/Extender: Melburn Hake, HOYT Weeks in Treatment: 0 Abuse/Suicide Risk Screen Items Answer ABUSE RISK SCREEN: Has anyone close to you tried to hurt or harm you recentlyo No Do you feel uncomfortable with anyone in your familyo No Has anyone forced you do things that you didnot want to doo No Electronic Signature(s) Signed: 04/20/2019 4:30:53 PM By: Gretta Cool, BSN, RN, CWS, Kim RN, BSN Entered By: Gretta Cool, BSN, RN, CWS, Kim on 04/20/2019 10:17:44 Amy Mcconnell ([id number]) -------------------------------------------------------------------------------- Activities of Daily Living Details Patient Name: Amy Mcconnell Date of Service: 04/20/2019 9:30 AM Medical Record Number: [id number] Patient Account Number: [id number] Date of Birth/Sex: 1951-01-23 (67 y.o. Female) Treating RN: Cornell Barman Primary Care Elexia Friedt: Caryl Bis, ERIC Other Clinician: Referring Chrys Landgrebe: Merlyn Lot Treating Cheyla Duchemin/Extender: Melburn Hake, HOYT Weeks in Treatment: 0 Activities of Daily Living Items Answer Activities of Daily Living (Please select one for each item) Drive Automobile Completely Able Take Medications Completely Able Use Telephone Completely Able Care for Appearance Completely Able Use Toilet Completely Able Bath / Shower Completely Able Dress Self Completely Able Feed Self Completely Able Walk Completely Able Get In / Out Bed Completely Able Housework Completely Able Prepare Meals Completely [place] for Self Completely Able Electronic  Signature(s) Signed: 04/20/2019 4:30:53 PM By: Gretta Cool, BSN, RN, CWS, Kim RN, BSN Entered By: Gretta Cool, BSN, RN, CWS, Kim on 04/20/2019 10:17:56 Amy Mcconnell ([id number]) -------------------------------------------------------------------------------- Education Screening Details Patient Name: Amy Mcconnell Date of Service: 04/20/2019 9:30 AM Medical Record Number: [id number] Patient Account Number: [id number] Date of Birth/Sex: 1951/05/13 (67 y.o. Female) Treating RN: Cornell Barman Primary Care Keighan Amezcua: Caryl Bis, ERIC Other Clinician: Referring Lamontae Ricardo: Merlyn Lot Treating Avree Szczygiel/Extender: Melburn Hake, HOYT Weeks in Treatment: 0 Primary Learner Assessed: Patient Learning Preferences/Education Level/Primary Language Learning Preference: Explanation, Demonstration Highest Education Level: College or Above Preferred Language: English Cognitive Barrier Language Barrier: No Translator Needed: No Memory Deficit: No Emotional Barrier: No Cultural/Religious Beliefs Affecting Medical Care: No Physical Barrier Impaired Vision: No Impaired Hearing: No Decreased Hand dexterity: No Knowledge/Comprehension Knowledge Level: High Comprehension Level: High Ability to understand written High instructions: Ability to understand verbal High instructions: Motivation Anxiety Level: Calm Cooperation: Cooperative Education Importance: Acknowledges Need Interest in Health Problems: Asks Questions Perception: Coherent Willingness to Engage in Self- High Management Activities: Readiness to Engage in Self- High Management Activities: Electronic Signature(s) Signed: 04/20/2019 4:30:53 PM By: Gretta Cool, BSN, RN, CWS, Kim RN, BSN Entered By: Gretta Cool, BSN, RN, CWS, Kim on 04/20/2019 10:18:36 Amy Mcconnell ([id number]) -------------------------------------------------------------------------------- Fall Risk Assessment Details Patient Name: Amy Mcconnell Date of Service:  04/20/2019 9:30 AM Medical Record Number: [id number] Patient Account Number: [id number] Date of Birth/Sex: 10/30/1951 (67 y.o. Female) Treating RN: Cornell Barman Primary Care Alondria Mousseau: Caryl Bis, ERIC Other Clinician: Referring Dorothe Elmore: Merlyn Lot Treating Maimuna Leaman/Extender: Melburn Hake, HOYT Weeks in Treatment: 0 Fall Risk Assessment Items Have you had 2 or more falls in the last 12 monthso 0 No Have you had any fall that resulted in injury in the last 12 monthso 0 No FALLS RISK SCREEN History of falling - immediate or within 3 months 25 Yes Secondary diagnosis (Do you have 2 or more medical diagnoseso) 0 No Ambulatory  aid None/bed rest/wheelchair/nurse 0 Yes Crutches/cane/walker 0 No Furniture 0 No Intravenous therapy Access/Saline/Heparin Lock 0 No Gait/Transferring Normal/ bed rest/ wheelchair 0 Yes Weak (short steps with or without shuffle, stooped but able to lift head while 0 No walking, may seek support from furniture) Impaired (short steps with shuffle, may have difficulty arising from chair, head 0 No down, impaired balance) Mental Status Oriented to own ability 0 Yes Electronic Signature(s) Signed: 04/20/2019 4:30:53 PM By: Gretta Cool, BSN, RN, CWS, Kim RN, BSN Entered By: Gretta Cool, BSN, RN, CWS, Kim on 04/20/2019 10:19:22 Amy Mcconnell ([id number]) -------------------------------------------------------------------------------- Foot Assessment Details Patient Name: Amy Mcconnell Date of Service: 04/20/2019 9:30 AM Medical Record Number: [id number] Patient Account Number: [id number] Date of Birth/Sex: [birth year] (67 y.o. Female) Treating RN: Cornell Barman Primary Care Gilberto Streck: Caryl Bis, ERIC Other Clinician: Referring Derelle Cockrell: Merlyn Lot Treating Brehanna Deveny/Extender: Melburn Hake, HOYT Weeks in Treatment: 0 Foot Assessment Items Site Locations + = Sensation present, - = Sensation absent, C = Callus, U = Ulcer R = Redness, W = Warmth, M = Maceration, PU =  Pre-ulcerative lesion F = Fissure, S = Swelling, D = Dryness Assessment Right: Left: Other Deformity: No No Prior Foot Ulcer: No No Prior Amputation: No No Charcot Joint: No No Ambulatory Status: Ambulatory Without Help Gait: Steady Electronic Signature(s) Signed: 04/20/2019 4:30:53 PM By: Gretta Cool, BSN, RN, CWS, Kim RN, BSN Entered By: Gretta Cool, BSN, RN, CWS, Kim on 04/20/2019 10:19:41 Amy Mcconnell ([id number]) -------------------------------------------------------------------------------- Nutrition Risk Screening Details Patient Name: Amy Mcconnell Date of Service: 04/20/2019 9:30 AM Medical Record Number: [id number] Patient Account Number: [id number] Date of Birth/Sex: 08-Aug-1951 (67 y.o. Female) Treating RN: Cornell Barman Primary Care Ephram Kornegay: Caryl Bis, ERIC Other Clinician: Referring Kerston Landeck: Merlyn Lot Treating Moxon Messler/Extender: Melburn Hake, HOYT Weeks in Treatment: 0 Height (in): 67 Weight (lbs): 142 Body Mass Index (BMI): 22.2 Nutrition Risk Screening Items Score Screening NUTRITION RISK SCREEN: I have an illness or condition that made me change the kind and/or amount of 0 No food I eat I eat fewer than two meals per day 0 No I eat few fruits and vegetables, or milk products 0 No I have three or more drinks of beer, liquor or wine almost every day 0 No I have tooth or mouth problems that make it hard for me to eat 0 No I don't always have enough money to buy the food I need 0 No I eat alone most of the time 0 No I take three or more different prescribed or over-the-counter drugs a day 0 No Without wanting to, I have lost or gained 10 pounds in the last six months 0 No I am not always physically able to shop, cook and/or feed myself 0 No Nutrition Protocols Good Risk Protocol 0 No interventions needed Moderate Risk Protocol High Risk Proctocol Risk Level: Good Risk Score: 0 Electronic Signature(s) Signed: 04/20/2019 4:30:53 PM By: Gretta Cool, BSN, RN, CWS,  Kim RN, BSN Entered By: Gretta Cool, BSN, RN, CWS, Kim on 04/20/2019 10:19:30

## 2019-04-21 ENCOUNTER — Other Ambulatory Visit: Payer: Medicare HMO

## 2019-04-21 ENCOUNTER — Other Ambulatory Visit: Payer: Self-pay | Admitting: Family Medicine

## 2019-04-21 DIAGNOSIS — D649 Anemia, unspecified: Secondary | ICD-10-CM

## 2019-04-22 ENCOUNTER — Telehealth: Payer: Self-pay

## 2019-04-22 ENCOUNTER — Other Ambulatory Visit: Payer: Self-pay | Admitting: Family Medicine

## 2019-04-22 DIAGNOSIS — S0101XA Laceration without foreign body of scalp, initial encounter: Secondary | ICD-10-CM

## 2019-04-22 NOTE — Progress Notes (Signed)
JONEL, SICK ([id number]) Visit Report for 04/20/2019 Chief Complaint Document Details Patient Name: NAYSA, PUSKAS Date of Service: 04/20/2019 9:30 AM Medical Record Number: [id number] Patient Account Number: [id number] Date of Birth/Sex: 05-Dec-1950 (68 y.o. Female) Treating RN: Harold Barban Primary Care Provider: Caryl Bis, ERIC Other Clinician: Referring Provider: Merlyn Lot Treating Provider/Extender: Melburn Hake, HOYT Weeks in Treatment: 0 Information Obtained from: Patient Chief Complaint Scalp laceration Electronic Signature(s) Signed: 04/22/2019 6:29:07 AM By: Worthy Keeler PA-C Entered By: Worthy Keeler on 04/20/2019 10:28:18 [name] ([id number]) -------------------------------------------------------------------------------- HPI Details Patient Name: [name] Date of Service: 04/20/2019 9:30 AM Medical Record Number: [id number] Patient Account Number: [id number] Date of Birth/Sex: 1951-09-04 (68 y.o. Female) Treating RN: Harold Barban Primary Care Provider: Caryl Bis, ERIC Other Clinician: Referring Provider: Merlyn Lot Treating Provider/Extender: Melburn Hake, HOYT Weeks in Treatment: 0 History of Present Illness HPI Description: 04/20/19 on evaluation today patient presents for initial evaluation our clinic concerning the laceration that she has extending from her left temple region across the upper portion of her forehead into the scalp into the posterior portion of her head. This occurred secondary to a fall which occurred on April 08, 2019. Subsequently she was seen in emergency department where this was cleaned and staple close that seems to have done very well for her. Fortunately there's no signs of active infection at this time. There does not appear to be signs of significant issue other than the fact that the blood collection which is around the left temple and Henrene Pastor orbital region has begun to leak out of a small  opening in the wound at the temple location which is caused a significant drainage unfortunately. It does appear that she is very close to the time when we need to move the sutures staples but I would like to actually get this likely just a few more days I will probably bring her back on Friday in order to undertake this. No fevers, chills, nausea, or vomiting noted at this time. Electronic Signature(s) Signed: 04/22/2019 6:29:07 AM By: Worthy Keeler PA-C Entered By: Worthy Keeler on 04/20/2019 16:43:58 [name] ([id number]) -------------------------------------------------------------------------------- Physical Exam Details Patient Name: [name] Date of Service: 04/20/2019 9:30 AM Medical Record Number: [id number] Patient Account Number: [id number] Date of Birth/Sex: [birth year] (68 y.o. Female) Treating RN: Harold Barban Primary Care Provider: Caryl Bis, ERIC Other Clinician: Referring Provider: Merlyn Lot Treating Provider/Extender: STONE III, HOYT Weeks in Treatment: 0 Constitutional sitting or standing blood pressure is within target range for patient.. pulse regular and within target range for patient.Marland Kitchen respirations regular, non-labored and within target range for patient.Marland Kitchen temperature within target range for patient.. Well- nourished and well-hydrated in no acute distress. Eyes conjunctiva clear no eyelid edema noted. pupils equal round and reactive to light and accommodation. Ears, Nose, Mouth, and Throat no gross abnormality of ear auricles or external auditory canals. normal hearing noted during conversation. mucus membranes moist. Respiratory normal breathing without difficulty. clear to auscultation bilaterally. Cardiovascular regular rate and rhythm with normal S1, S2. no clubbing, cyanosis, significant edema, <3 sec cap refill. Gastrointestinal (GI) soft, non-tender, non-distended, +BS. no ventral hernia noted. Musculoskeletal normal  gait and posture. no significant deformity or arthritic changes, no loss or range of motion, no clubbing. Psychiatric this patient is able to make decisions and demonstrates good insight into disease process. Alert and Oriented x 3. pleasant and cooperative. Notes Upon inspection today patient's wound actually appears to be healing quite nicely which is excellent  news. She does have still some crusted and blood areas in the scalp region that I think would be helpful to wash off a little bit more before attempting to remove the staples at this point. Nonetheless overall she seems to be doing quite well other than the obvious swelling that she has in the left. Orbital and temporal region secondary to the trauma that she sustained. Fortunately everything checked out okay at the emergency department she feels blessed to be doing as well as she is. She does have a significant amount of bloody drainage coming from the left temporal region at the distal portion of laceration. One staple was removed today at this distal portion just to allow this to drain more appropriately. Electronic Signature(s) Signed: 04/22/2019 6:29:07 AM By: Worthy Keeler PA-C Entered By: Worthy Keeler on 04/20/2019 16:45:31 [name] ([id number]) -------------------------------------------------------------------------------- Physician Orders Details Patient Name: [name] Date of Service: 04/20/2019 9:30 AM Medical Record Number: [id number] Patient Account Number: [id number] Date of Birth/Sex: 05-Jul-1951 (68 y.o. Female) Treating RN: Harold Barban Primary Care Provider: Caryl Bis, ERIC Other Clinician: Referring Provider: Merlyn Lot Treating Provider/Extender: Melburn Hake, HOYT Weeks in Treatment: 0 Verbal / Phone Orders: No Diagnosis Coding ICD-10 Coding Code Description S01.80XA Unspecified open wound of other part of head, initial encounter I10 Essential (primary) hypertension E03.9  Hypothyroidism, unspecified F31.89 Other bipolar disorder Wound Cleansing Wound #1 Left Head - frontal o May Shower, gently pat wound dry prior to applying new dressing. Primary Wound Dressing Wound #1 Left Head - frontal o XtraSorb - Antibiotic Ointment, apply along suture line Secondary Dressing o Conform/Kerlix - Wrap around head, for pressure Dressing Change Frequency Wound #1 Left Head - frontal o Change dressing every day. Follow-up Appointments Wound #1 Left Head - frontal o Other: - Return Friday, June 19th for Staple removal Home Health Wound #1 Left Head - frontal o [place] for [place] Nurse may visit PRN to address patientos wound care needs. o FACE TO FACE ENCOUNTER: MEDICARE and MEDICAID PATIENTS: I certify that this patient is under my care and that I had a face-to-face encounter that meets the physician face-to-face encounter requirements with this patient on this date. The encounter with the patient was in whole or in part for the following MEDICAL CONDITION: (primary reason for [place]) MEDICAL NECESSITY: I certify, that based on my findings, NURSING services are a medically necessary home health service. HOME BOUND STATUS: I certify that my clinical findings support that this patient is homebound (i.e., Due to illness or injury, pt requires aid of supportive devices such as crutches, cane, wheelchairs, walkers, the use of special transportation or the assistance of another person to leave their place of residence. There is a normal inability to leave the home and doing so requires considerable and taxing effort. Other absences are for medical reasons / religious services and are infrequent or of short duration when for other reasons). MAYLING, ABER ([id number]) o If current dressing causes regression in wound condition, may D/C ordered dressing product/s and apply Normal  Saline Moist Dressing daily until next [place] / Other MD appointment. No Name of regression in wound condition at [phone]. o Please direct any NON-WOUND related issues/requests for orders to patient's Primary Care Physician Electronic Signature(s) Signed: 04/20/2019 3:14:44 PM By: Harold Barban Signed: 04/22/2019 6:29:07 AM By: Worthy Keeler PA-C Entered By: Harold Barban on 04/20/2019 10:46:21 [place]  Lenna Sciara ([id number]) -------------------------------------------------------------------------------- Problem List Details Patient Name: CECILY, LAWHORNE Date of Service: 04/20/2019 9:30 AM Medical Record Number: [id number] Patient Account Number: [id number] Date of Birth/Sex: 1951-03-05 (68 y.o. Female) Treating RN: Harold Barban Primary Care Provider: Caryl Bis, ERIC Other Clinician: Referring Provider: Merlyn Lot Treating Provider/Extender: Melburn Hake, HOYT Weeks in Treatment: 0 Active Problems ICD-10 Evaluated Encounter Code Description Active Date Today Diagnosis S01.80XA Unspecified open wound of other part of head, initial 04/20/2019 No Yes encounter [place] (primary) hypertension 04/20/2019 No Yes E03.9 Hypothyroidism, unspecified 04/20/2019 No Yes F31.89 Other bipolar disorder 04/20/2019 No Yes Inactive Problems Resolved Problems Electronic Signature(s) Signed: 04/22/2019 6:29:07 AM By: Worthy Keeler PA-C Entered By: Worthy Keeler on 04/20/2019 10:26:41 [name] ([id number]) -------------------------------------------------------------------------------- Progress Note Details Patient Name: [name] Date of Service: 04/20/2019 9:30 AM Medical Record Number: [id number] Patient Account Number: [id number] Date of Birth/Sex: 07/22/1951 (68 y.o. Female) Treating RN: Harold Barban Primary Care Provider: Caryl Bis, ERIC Other Clinician: Referring Provider: Merlyn Lot Treating  Provider/Extender: Melburn Hake, HOYT Weeks in Treatment: 0 Subjective Chief Complaint Information obtained from Patient Scalp laceration History of Present Illness (HPI) 04/20/19 on evaluation today patient presents for initial evaluation our clinic concerning the laceration that she has extending from her left temple region across the upper portion of her forehead into the scalp into the posterior portion of her head. This occurred secondary to a fall which occurred on April 08, 2019. Subsequently she was seen in emergency department where this was cleaned and staple close that seems to have done very well for her. Fortunately there's no signs of active infection at this time. There does not appear to be signs of significant issue other than the fact that the blood collection which is around the left temple and Henrene Pastor orbital region has begun to leak out of a small opening in the wound at the temple location which is caused a significant drainage unfortunately. It does appear that she is very close to the time when we need to move the sutures staples but I would like to actually get this likely just a few more days I will probably bring her back on Friday in order to undertake this. No fevers, chills, nausea, or vomiting noted at this time. Patient History Allergies Benicar (Severity: Severe, Reaction: swelling) Family History Heart Disease - Siblings,Paternal Grandparents, Hypertension - Paternal Grandparents,Siblings, Thyroid Problems - Maternal Grandparents,Father,Siblings, No family history of Cancer, Diabetes, Kidney Disease, Lung Disease, Seizures, Stroke, Tuberculosis. Social History Former smoker - Quit 20+ years, Marital Status - Divorced, Alcohol Use - Never, Drug Use - No History, Caffeine Use - Daily. Medical History Cardiovascular Patient has history of Hypertension Denies history of Angina, Arrhythmia, Congestive Heart Failure, Coronary Artery Disease, Deep Vein  Thrombosis, Hypotension, Myocardial Infarction, Peripheral Arterial Disease, Peripheral Venous Disease, Phlebitis, Vasculitis Endocrine Denies history of Type I Diabetes, Type II Diabetes Integumentary (Skin) Denies history of History of Burn, History of pressure wounds Medical And Surgical History Notes Psychiatric Bipolar Review of Systems (ROS) Constitutional Symptoms (General Health) Denies complaints or symptoms of Fatigue, Fever, Chills, Marked Weight Change. KAITLYND, PHILLIPS ([id number]) Eyes Denies complaints or symptoms of Dry Eyes, Vision Changes, Glasses / Contacts. Ear/Nose/Mouth/Throat Denies complaints or symptoms of Difficult clearing ears, Sinusitis. Hematologic/Lymphatic Denies complaints or symptoms of Bleeding / Clotting Disorders, Human Immunodeficiency Virus. Respiratory Denies complaints or symptoms of Chronic or frequent coughs, Shortness of Breath. Cardiovascular Denies complaints or symptoms of Chest pain, LE edema. Gastrointestinal Denies complaints or  symptoms of Frequent diarrhea, Nausea, Vomiting. Endocrine Complains or has symptoms of Thyroid disease - Hypothyroidism. Denies complaints or symptoms of Hepatitis, Polydypsia (Excessive Thirst). Genitourinary Denies complaints or symptoms of Kidney failure/ Dialysis, Incontinence/dribbling. Immunological Denies complaints or symptoms of Hives, Itching. Integumentary (Skin) Complains or has symptoms of Wounds. Musculoskeletal Denies complaints or symptoms of Muscle Pain, Muscle Weakness. Neurologic Denies complaints or symptoms of Numbness/parasthesias, Focal/Weakness. Psychiatric Denies complaints or symptoms of Anxiety, Claustrophobia. Objective Constitutional sitting or standing blood pressure is within target range for patient.. pulse regular and within target range for patient.Marland Kitchen respirations regular, non-labored and within target range for patient.Marland Kitchen temperature within target range for  patient.. Well- nourished and well-hydrated in no acute distress. Vitals Time Taken: 10:06 AM, Height: 67 in, Source: Stated, Weight: 142 lbs, Source: Measured, BMI: 22.2, Temperature: 99.1 F, Pulse: 72 bpm, Respiratory Rate: 16 breaths/min, Blood Pressure: 116/65 mmHg. Eyes conjunctiva clear no eyelid edema noted. pupils equal round and reactive to light and accommodation. Ears, Nose, Mouth, and Throat no gross abnormality of ear auricles or external auditory canals. normal hearing noted during conversation. mucus membranes moist. Respiratory normal breathing without difficulty. clear to auscultation bilaterally. Cardiovascular regular rate and rhythm with normal S1, S2. no clubbing, cyanosis, significant edema, JAELYN, BOURGOIN. ([id number]) Gastrointestinal (GI) soft, non-tender, non-distended, +BS. no ventral hernia noted. Musculoskeletal normal gait and posture. no significant deformity or arthritic changes, no loss or range of motion, no clubbing. Psychiatric this patient is able to make decisions and demonstrates good insight into disease process. Alert and Oriented x 3. pleasant and cooperative. General Notes: Upon inspection today patient's wound actually appears to be healing quite nicely which is excellent news. She does have still some crusted and blood areas in the scalp region that I think would be helpful to wash off a little bit more before attempting to remove the staples at this point. Nonetheless overall she seems to be doing quite well other than the obvious swelling that she has in the left. Orbital and temporal region secondary to the trauma that she sustained. Fortunately everything checked out okay at the emergency department she feels blessed to be doing as well as she is. She does have a significant amount of bloody drainage coming from the left temporal region at the distal portion of laceration. One staple was removed today at this distal portion just to allow  this to drain more appropriately. Integumentary (Hair, Skin) Wound #1 status is Open. Original cause of wound was Trauma. The wound is located on the Left Head - frontal. The wound measures 0.1cm length x 0.2cm width x 0.4cm depth; 0.016cm^2 area and 0.006cm^3 volume. There is Fat Layer (Subcutaneous Tissue) Exposed exposed. There is a large amount of serosanguineous drainage noted. The wound margin is well defined and not attached to the wound base. There is no granulation within the wound bed. There is no necrotic tissue within the wound bed. Assessment Active Problems ICD-10 Unspecified open wound of other part of head, initial encounter Essential (primary) hypertension Hypothyroidism, unspecified Other bipolar disorder Plan Wound Cleansing: Wound #1 Left Head - frontal: May Shower, gently pat wound dry prior to applying new dressing. Primary Wound Dressing: Wound #1 Left Head - frontal: XtraSorb - Antibiotic Ointment, apply along suture line Secondary Dressing: Conform/Kerlix - Wrap around head, for pressure Dressing Change Frequency: Wound #1 Left Head - frontal: Change dressing every day. Follow-up Appointments: CASHAY, MANGANELLI ([id number]) Wound #1 Left Head - frontal: Other: - Return Friday, June 19th  for Staple removal Home Health: Wound #1 Left Head - frontal: [place] for [place] Nurse may visit PRN to address patient s wound care needs. FACE TO FACE ENCOUNTER: MEDICARE and MEDICAID PATIENTS: I certify that this patient is under my care and that I had a face-to-face encounter that meets the physician face-to-face encounter requirements with this patient on this date. The encounter with the patient was in whole or in part for the following MEDICAL CONDITION: (primary reason for [place]) MEDICAL NECESSITY: I certify, that based on my findings, NURSING services are a medically necessary home health  service. HOME BOUND STATUS: I certify that my clinical findings support that this patient is homebound (i.e., Due to illness or injury, pt requires aid of supportive devices such as crutches, cane, wheelchairs, walkers, the use of special transportation or the assistance of another person to leave their place of residence. There is a normal inability to leave the home and doing so requires considerable and taxing effort. Other absences are for medical reasons / religious services and are infrequent or of short duration when for other reasons). If current dressing causes regression in wound condition, may D/C ordered dressing product/s and apply Normal Saline Moist Dressing daily until next [place] / Other MD appointment. [place] of regression in wound condition at [phone]. Please direct any NON-WOUND related issues/requests for orders to patient's Primary Care Physician My suggestion at this point is gonna be that we initiate the above wound care measures for the next week and the patient is in agreement with plan. We will subsequently see were things stand at follow-up. If anything changes worsens meantime patient will contact the office and let me know. Otherwise I'll see her back on Friday in order to see about removing her remaining 31 staples. Electronic Signature(s) Signed: 04/22/2019 6:29:07 AM By: Worthy Keeler PA-C Entered By: Worthy Keeler on 04/20/2019 16:45:55 [name] ([id number]) -------------------------------------------------------------------------------- ROS/PFSH Details Patient Name: [name] Date of Service: 04/20/2019 9:30 AM Medical Record Number: [id number] Patient Account Number: [id number] Date of Birth/Sex: 09-Dec-1950 (68 y.o. Female) Treating RN: Cornell Barman Primary Care Provider: Caryl Bis, ERIC Other Clinician: Referring Provider: Merlyn Lot Treating Provider/Extender: Melburn Hake, HOYT Weeks in  Treatment: 0 Constitutional Symptoms (General Health) Complaints and Symptoms: Negative for: Fatigue; Fever; Chills; Marked Weight Change Eyes Complaints and Symptoms: Negative for: Dry Eyes; Vision Changes; Glasses / Contacts Ear/Nose/Mouth/Throat Complaints and Symptoms: Negative for: Difficult clearing ears; Sinusitis Hematologic/Lymphatic Complaints and Symptoms: Negative for: Bleeding / Clotting Disorders; Human Immunodeficiency Virus Respiratory Complaints and Symptoms: Negative for: Chronic or frequent coughs; Shortness of Breath Cardiovascular Complaints and Symptoms: Negative for: Chest pain; LE edema Medical History: Positive for: Hypertension Negative for: Angina; Arrhythmia; Congestive Heart Failure; Coronary Artery Disease; Deep Vein Thrombosis; Hypotension; Myocardial Infarction; Peripheral Arterial Disease; Peripheral Venous Disease; Phlebitis; Vasculitis Gastrointestinal Complaints and Symptoms: Negative for: Frequent diarrhea; Nausea; Vomiting Endocrine Complaints and Symptoms: Positive for: Thyroid disease - Hypothyroidism Negative for: Hepatitis; Polydypsia (Excessive Thirst) Medical History: Negative for: Type I Diabetes; Type II Diabetes Genitourinary BRINKLEY, PEET ([id number]) Complaints and Symptoms: Negative for: Kidney failure/ Dialysis; Incontinence/dribbling Immunological Complaints and Symptoms: Negative for: Hives; Itching Integumentary (Skin) Complaints and Symptoms: Positive for: Wounds Medical History: Negative for: History of Burn; History of pressure wounds Musculoskeletal Complaints and Symptoms: Negative for: Muscle Pain; Muscle Weakness Neurologic Complaints and Symptoms: Negative for: Numbness/parasthesias; Focal/Weakness Psychiatric Complaints and Symptoms: Negative for:  Anxiety; Claustrophobia Medical History: Past Medical History Notes: Bipolar Immunizations Pneumococcal Vaccine: Received Pneumococcal Vaccination:  No Implantable Devices None Family and Social History Cancer: No; Diabetes: No; Heart Disease: Yes - Siblings,Paternal Grandparents; Hypertension: Yes - Paternal Grandparents,Siblings; Kidney Disease: No; Lung Disease: No; Seizures: No; Stroke: No; Thyroid Problems: Yes - Maternal Grandparents,Father,Siblings; Tuberculosis: No; Former smoker - Quit 20+ years; Marital Status - Divorced; Alcohol Use: Never; Drug Use: No History; Caffeine Use: Daily Electronic Signature(s) Signed: 04/20/2019 4:30:53 PM By: Gretta Cool, BSN, RN, CWS, Kim RN, BSN Signed: 04/22/2019 6:29:07 AM By: Worthy Keeler PA-C Entered By: Gretta Cool BSN, RN, CWS, Kim on 04/20/2019 10:17:35 [name] ([id number]) -------------------------------------------------------------------------------- SuperBill Details Patient Name: [name] Date of Service: 04/20/2019 Medical Record Number: [id number] Patient Account Number: [id number] Date of Birth/Sex: [birth year] (68 y.o. Female) Treating RN: Harold Barban Primary Care Provider: Caryl Bis, ERIC Other Clinician: Referring Provider: Merlyn Lot Treating Provider/Extender: STONE III, HOYT Weeks in Treatment: 0 Diagnosis Coding ICD-10 Codes Code Description S01.80XA Unspecified open wound of other part of head, initial encounter I10 Essential (primary) hypertension E03.9 Hypothyroidism, unspecified F31.89 Other bipolar disorder Facility Procedures CPT4 Code: [id number] Description: 99213 - WOUND CARE VISIT-LEV 3 EST PT Modifier: Quantity: 1 Physician Procedures CPT4 Code: [id number] Description: WC PHYS LEVEL 3 o NEW PT ICD-10 Diagnosis Description S01.80XA Unspecified open wound of other part of head, initial en I10 Essential (primary) hypertension E03.9 Hypothyroidism, unspecified F31.89 Other bipolar disorder Modifier: counter Quantity: 1 Electronic Signature(s) Signed: 04/22/2019 6:29:07 AM By: Worthy Keeler PA-C Entered By: Worthy Keeler on  04/20/2019 16:46:07

## 2019-04-22 NOTE — Progress Notes (Signed)
Amy Mcconnell, Amy Mcconnell ([id number]) Visit Report for 04/20/2019 Allergy List Details Patient Name: Amy Mcconnell, Amy Mcconnell Date of Service: 04/20/2019 9:30 AM Medical Record Number: [id number] Patient Account Number: [id number] Date of Birth/Sex: [birth year] (67 y.o. Female) Treating RN: Cornell Barman Primary Care Amaar Oshita: Caryl Bis, ERIC Other Clinician: Referring Emmarose Klinke: Merlyn Lot Treating Izreal Kock/Extender: Melburn Hake, HOYT Weeks in Treatment: 0 Allergies Active Allergies Benicar Reaction: swelling Severity: Severe Allergy Notes Electronic Signature(s) Signed: 04/20/2019 4:30:53 PM By: Gretta Cool, BSN, RN, CWS, Kim RN, BSN Entered By: Gretta Cool, BSN, RN, CWS, Kim on 04/20/2019 10:13:34 Amy Mcconnell ([id number]) -------------------------------------------------------------------------------- Arrival Information Details Patient Name: Amy Mcconnell Date of Service: 04/20/2019 9:30 AM Medical Record Number: [id number] Patient Account Number: [id number] Date of Birth/Sex: [birth year] (67 y.o. Female) Treating RN: Harold Barban Primary Care Rodriquez Thorner: Caryl Bis, ERIC Other Clinician: Referring Zaydon Kinser: Merlyn Lot Treating Anibal Quinby/Extender: Melburn Hake, HOYT Weeks in Treatment: 0 Visit Information Patient Arrived: Ambulatory Arrival Time: 10:04 Accompanied By: husband Transfer Assistance: None Patient Identification Verified: Yes Secondary Verification Process Completed: Yes Electronic Signature(s) Signed: 04/20/2019 1:31:57 PM By: Lorine Bears RCP, RRT, CHT Entered By: Lorine Bears on 04/20/2019 10:05:21 Amy Mcconnell ([id number]) -------------------------------------------------------------------------------- Clinic Level of Care Assessment Details Patient Name: Amy Mcconnell Date of Service: 04/20/2019 9:30 AM Medical Record Number: [id number] Patient Account Number: [id number] Date of Birth/Sex: Nov 03, 1951 (67 y.o.  Female) Treating RN: Harold Barban Primary Care Saxon Crosby: Caryl Bis, ERIC Other Clinician: Referring Trenisha Lafavor: Merlyn Lot Treating Unika Nazareno/Extender: Melburn Hake, HOYT Weeks in Treatment: 0 Clinic Level of Care Assessment Items TOOL 2 Quantity Score []  - Use when only an EandM is performed on the INITIAL visit 0 ASSESSMENTS - Nursing Assessment / Reassessment X - General Physical Exam (combine w/ comprehensive assessment (listed just below) when 1 20 performed on new pt. evals) X- 1 25 Comprehensive Assessment (HX, ROS, Risk Assessments, Wounds Hx, etc.) ASSESSMENTS - Wound and Skin Assessment / Reassessment X - Simple Wound Assessment / Reassessment - one wound 1 5 []  - 0 Complex Wound Assessment / Reassessment - multiple wounds []  - 0 Dermatologic / Skin Assessment (not related to wound area) ASSESSMENTS - Ostomy and/or Continence Assessment and Care []  - Incontinence Assessment and Management 0 []  - 0 Ostomy Care Assessment and Management (repouching, etc.) PROCESS - Coordination of Care X - Simple Patient / Family Education for ongoing care 1 15 []  - 0 Complex (extensive) Patient / Family Education for ongoing care []  - 0 Staff obtains Programmer, systems, Records, Test Results / Process Orders []  - 0 Staff telephones HHA, Nursing Homes / Clarify orders / etc []  - 0 Routine Transfer to another Facility (non-emergent condition) []  - 0 Routine Hospital Admission (non-emergent condition) []  - 0 New Admissions / Biomedical engineer / Ordering NPWT, Apligraf, etc. []  - 0 Emergency Hospital Admission (emergent condition) X- 1 10 Simple Discharge Coordination []  - 0 Complex (extensive) Discharge Coordination PROCESS - Special Needs []  - Pediatric / Minor Patient Management 0 []  - 0 Isolation Patient Management Amy Mcconnell, Amy Mcconnell ([id number]) []  - 0 Hearing / Language / Visual special needs []  - 0 Assessment of Community assistance (transportation, D/C planning,  etc.) []  - 0 Additional assistance / Altered mentation []  - 0 Support Surface(s) Assessment (bed, cushion, seat, etc.) INTERVENTIONS - Wound Cleansing / Measurement X - Wound Imaging (photographs - any number of wounds) 1 5 []  - 0 Wound Tracing (instead of photographs) X- 1 5 Simple Wound Measurement - one wound []  - 0 Complex Wound Measurement - multiple  wounds X- 1 5 Simple Wound Cleansing - one wound []  - 0 Complex Wound Cleansing - multiple wounds INTERVENTIONS - Wound Dressings X - Small Wound Dressing one or multiple wounds 1 10 []  - 0 Medium Wound Dressing one or multiple wounds []  - 0 Large Wound Dressing one or multiple wounds []  - 0 Application of Medications - injection INTERVENTIONS - Miscellaneous []  - External ear exam 0 []  - 0 Specimen Collection (cultures, biopsies, blood, body fluids, etc.) []  - 0 Specimen(s) / Culture(s) sent or taken to Lab for analysis []  - 0 Patient Transfer (multiple staff / Civil Service fast streamer / Similar devices) []  - 0 Simple Staple / Suture removal (25 or less) []  - 0 Complex Staple / Suture removal (26 or more) []  - 0 Hypo / Hyperglycemic Management (close monitor of Blood Glucose) X- 1 15 Ankle / Brachial Index (ABI) - do not check if billed separately Has the patient been seen at the hospital within the last three years: Yes Total Score: 115 Level Of Care: New/Established - Level 3 Electronic Signature(s) Signed: 04/20/2019 3:14:44 PM By: Harold Barban Entered By: Harold Barban on 04/20/2019 10:38:48 Amy Mcconnell ([id number]) -------------------------------------------------------------------------------- Encounter Discharge Information Details Patient Name: Amy Mcconnell Date of Service: 04/20/2019 9:30 AM Medical Record Number: [id number] Patient Account Number: [id number] Date of Birth/Sex: 12-05-1950 (67 y.o. Female) Treating RN: Army Melia Primary Care Keyerra Lamere: Caryl Bis, ERIC Other Clinician: Referring  Abishai Viegas: Merlyn Lot Treating Aleighya Mcanelly/Extender: Melburn Hake, HOYT Weeks in Treatment: 0 Encounter Discharge Information Items Discharge Condition: Stable Ambulatory Status: Ambulatory Discharge Destination: Home Transportation: Private Auto Accompanied By: self Schedule Follow-up Appointment: Yes Clinical Summary of Care: Electronic Signature(s) Signed: 04/20/2019 11:20:28 AM By: Army Melia Entered By: Army Melia on 04/20/2019 10:53:16 Amy Mcconnell ([id number]) -------------------------------------------------------------------------------- Lower Extremity Assessment Details Patient Name: Amy Mcconnell Date of Service: 04/20/2019 9:30 AM Medical Record Number: [id number] Patient Account Number: [id number] Date of Birth/Sex: 11-22-1950 (67 y.o. Female) Treating RN: Cornell Barman Primary Care Icholas Irby: Caryl Bis, ERIC Other Clinician: Referring Conchita Truxillo: Merlyn Lot Treating Mariyana Fulop/Extender: Melburn Hake, HOYT Weeks in Treatment: 0 Electronic Signature(s) Signed: 04/20/2019 4:30:53 PM By: Gretta Cool, BSN, RN, CWS, Kim RN, BSN Entered By: Gretta Cool, BSN, RN, CWS, Kim on 04/20/2019 10:13:02 Amy Mcconnell ([id number]) -------------------------------------------------------------------------------- Multi Wound Chart Details Patient Name: Amy Mcconnell Date of Service: 04/20/2019 9:30 AM Medical Record Number: [id number] Patient Account Number: [id number] Date of Birth/Sex: 1950-11-18 (67 y.o. Female) Treating RN: Harold Barban Primary Care Karli Wickizer: Caryl Bis, ERIC Other Clinician: Referring Marlee Trentman: Merlyn Lot Treating Brandye Inthavong/Extender: STONE III, HOYT Weeks in Treatment: 0 Vital Signs Height(in): 67 Pulse(bpm): 72 Weight(lbs): 142 Blood Pressure(mmHg): 116/65 Body Mass Index(BMI): 22 Temperature(F): 99.1 Respiratory Rate 16 (breaths/min): Photos: [N/A:N/A] Wound Location: Left Head - frontal N/A N/A Wounding Event: Trauma N/A  N/A Primary Etiology: Trauma, Other N/A N/A Date Acquired: 04/08/2019 N/A N/A Weeks of Treatment: 0 N/A N/A Wound Status: Open N/A N/A Measurements L x W x D 0.1x0.2x0.4 N/A N/A (cm) Area (cm) : 0.016 N/A N/A Volume (cm) : 0.006 N/A N/A % Reduction in Area: 0.00% N/A N/A % Reduction in Volume: 0.00% N/A N/A Classification: Full Thickness Without N/A N/A Exposed Support Structures Exudate Amount: Large N/A N/A Exudate Type: Serosanguineous N/A N/A Exudate Color: red, brown N/A N/A Wound Margin: Well defined, not attached N/A N/A Granulation Amount: None Present (0%) N/A N/A Necrotic Amount: None Present (0%) N/A N/A Exposed Structures: Fat Layer (Subcutaneous N/A N/A Tissue) Exposed: Yes Fascia: No Tendon: No Muscle: No  Joint: No Bone: No Epithelialization: None N/A N/A Amy Mcconnell, Amy Mcconnell ([id number]) Treatment Notes Electronic Signature(s) Signed: 04/20/2019 3:14:44 PM By: Harold Barban Entered By: Harold Barban on 04/20/2019 10:30:01 Amy Mcconnell ([id number]) -------------------------------------------------------------------------------- Multi-Disciplinary Care Plan Details Patient Name: Amy Mcconnell Date of Service: 04/20/2019 9:30 AM Medical Record Number: [id number] Patient Account Number: [id number] Date of Birth/Sex: July 24, 1951 (67 y.o. Female) Treating RN: Harold Barban Primary Care Tjay Velazquez: Caryl Bis, ERIC Other Clinician: Referring Kelley Knoth: Merlyn Lot Treating Fatim Vanderschaaf/Extender: Melburn Hake, HOYT Weeks in Treatment: 0 Active Inactive Wound/Skin Impairment Nursing Diagnoses: Impaired tissue integrity Goals: Ulcer/skin breakdown will have a volume reduction of 30% by week 4 Date Initiated: 04/20/2019 Target Resolution Date: 05/20/2019 Goal Status: Active Interventions: Assess patient/caregiver ability to obtain necessary supplies Assess patient/caregiver ability to perform ulcer/skin care regimen upon admission and as needed Assess  ulceration(s) every visit Provide education on ulcer and skin care Notes: Electronic Signature(s) Signed: 04/20/2019 3:14:44 PM By: Harold Barban Entered By: Harold Barban on 04/20/2019 10:29:43 Amy Mcconnell ([id number]) -------------------------------------------------------------------------------- Pain Assessment Details Patient Name: Amy Mcconnell Date of Service: 04/20/2019 9:30 AM Medical Record Number: [id number] Patient Account Number: [id number] Date of Birth/Sex: 04/25/1951 (67 y.o. Female) Treating RN: Harold Barban Primary Care Jaedyn Marrufo: Caryl Bis, ERIC Other Clinician: Referring Jayma Volpi: Merlyn Lot Treating Amarrah Meinhart/Extender: STONE III, HOYT Weeks in Treatment: 0 Active Problems Location of Pain Severity and Description of Pain Patient Has Paino Yes Site Locations Rate the pain. Current Pain Level: 3 Pain Management and Medication Current Pain Management: Electronic Signature(s) Signed: 04/20/2019 1:31:57 PM By: Lorine Bears RCP, RRT, CHT Signed: 04/20/2019 3:14:44 PM By: Harold Barban Entered By: Lorine Bears on 04/20/2019 10:06:06 Amy Mcconnell ([id number]) -------------------------------------------------------------------------------- Patient/Caregiver Education Details Patient Name: Amy Mcconnell Date of Service: 04/20/2019 9:30 AM Medical Record Number: [id number] Patient Account Number: [id number] Date of Birth/Gender: Nov 09, 1950 (67 y.o. Female) Treating RN: Harold Barban Primary Care Physician: Caryl Bis, ERIC Other Clinician: Referring Physician: Merlyn Lot Treating Physician/Extender: Sharalyn Ink in Treatment: 0 Education Assessment Education Provided To: Patient Education Topics Provided Wound/Skin Impairment: Handouts: Caring for Your Ulcer Methods: Demonstration, Explain/Verbal Responses: State content correctly Electronic Signature(s) Signed: 04/20/2019  3:14:44 PM By: Harold Barban Entered By: Harold Barban on 04/20/2019 10:30:22 Amy Mcconnell ([id number]) -------------------------------------------------------------------------------- Wound Assessment Details Patient Name: Amy Mcconnell Date of Service: 04/20/2019 9:30 AM Medical Record Number: [id number] Patient Account Number: [id number] Date of Birth/Sex: Apr 02, 1951 (67 y.o. Female) Treating RN: Cornell Barman Primary Care Alazne Quant: Caryl Bis, ERIC Other Clinician: Referring Selim Durden: Merlyn Lot Treating Calayah Guadarrama/Extender: Melburn Hake, HOYT Weeks in Treatment: 0 Wound Status Wound Number: 1 Primary Etiology: Trauma, Other Wound Location: Left Head - frontal Wound Status: Open Wounding Event: Trauma Comorbid History: Hypertension Date Acquired: 04/08/2019 Weeks Of Treatment: 0 Clustered Wound: No Photos Wound Measurements Length: (cm) 0.1 Width: (cm) 0.2 Depth: (cm) 0.4 Area: (cm) 0.016 Volume: (cm) 0.006 % Reduction in Area: 0% % Reduction in Volume: 0% Epithelialization: None Wound Description Full Thickness Without Exposed Support Foul Odo Classification: Structures Slough/F Wound Margin: Well defined, not attached Exudate Large Amount: Exudate Type: Serosanguineous Exudate Color: red, brown r After Cleansing: No ibrino No Wound Bed Granulation Amount: None Present (0%) Exposed Structure Necrotic Amount: None Present (0%) Fascia Exposed: No Fat Layer (Subcutaneous Tissue) Exposed: Yes Tendon Exposed: No Muscle Exposed: No Joint Exposed: No Bone Exposed: No Amy Mcconnell, Amy Mcconnell ([id number]) Treatment Notes Wound #1 (Left Head - frontal) Notes TCA, xtrasorb, conform Electronic Signature(s) Signed: 04/20/2019 12:35:55 PM By: Oretha Ellis,  Carmell Austria Signed: 04/20/2019 4:30:53 PM By: Gretta Cool, BSN, RN, CWS, Kim RN, BSN Entered By: Harold Barban on 04/20/2019 12:35:55 Amy Mcconnell  ([id number]) -------------------------------------------------------------------------------- Jones Details Patient Name: Amy Mcconnell Date of Service: 04/20/2019 9:30 AM Medical Record Number: [id number] Patient Account Number: [id number] Date of Birth/Sex: 04/09/1951 (67 y.o. Female) Treating RN: Harold Barban Primary Care Lashe Oliveira: Caryl Bis, ERIC Other Clinician: Referring Ember Gottwald: Merlyn Lot Treating Lovena Kluck/Extender: STONE III, HOYT Weeks in Treatment: 0 Vital Signs Time Taken: 10:06 Temperature (F): 99.1 Height (in): 67 Pulse (bpm): 72 Source: Stated Respiratory Rate (breaths/min): 16 Weight (lbs): 142 Blood Pressure (mmHg): 116/65 Source: Measured Reference Range: 80 - 120 mg / dl Body Mass Index (BMI): 22.2 Electronic Signature(s) Signed: 04/20/2019 1:31:57 PM By: Lorine Bears RCP, RRT, CHT Entered By: Becky Sax, Amado Nash on 04/20/2019 10:06:58

## 2019-04-22 NOTE — Telephone Encounter (Signed)
Copied from [place] [id number]. Topic: General - Other >> Apr 22, 2019  8:30 AM Leward Quan A wrote: Reason for CRM: Patient called to say that she was informed by the wound care center that she will be needing some help at home to dress her wound. She have contacted [facility] and they will be sending information to Dr Caryl Bis so patient just wanted the doctor to be aware and on the look out for the paper work request. Ph# [phone]

## 2019-04-22 NOTE — Telephone Encounter (Signed)
Copied from [place] [id number]. Topic: General - Other >> Apr 22, 2019  8:30 AM Leward Quan A wrote: Reason for CRM: Patient called to say that she was informed by the wound care center that she will be needing some help at home to dress her wound. She have contacted [facility] and they will be sending information to Dr Caryl Bis so patient just wanted the doctor to be aware and on the look out for the paper work request. Ph# [phone] >> Apr 22, 2019  5:56 PM Waylan Rocher, Lumin L wrote: Patient needs order to have [facility] out tomorrow to assist with wound care.  >> Apr 22, 2019 12:52 PM Keene Breath wrote: Patient is calling again to follow up on her request to receive home health services.  Patient wants to know should the referral come from her PCP or should it come from the wound care ctr.  Please advise and let patient know at [phone]

## 2019-04-23 ENCOUNTER — Telehealth: Payer: Self-pay

## 2019-04-23 DIAGNOSIS — I10 Essential (primary) hypertension: Secondary | ICD-10-CM | POA: Diagnosis not present

## 2019-04-23 DIAGNOSIS — E039 Hypothyroidism, unspecified: Secondary | ICD-10-CM | POA: Diagnosis not present

## 2019-04-23 DIAGNOSIS — S0101XA Laceration without foreign body of scalp, initial encounter: Secondary | ICD-10-CM

## 2019-04-23 DIAGNOSIS — F3189 Other bipolar disorder: Secondary | ICD-10-CM | POA: Diagnosis not present

## 2019-04-23 DIAGNOSIS — Z4800 Encounter for change or removal of nonsurgical wound dressing: Secondary | ICD-10-CM | POA: Diagnosis not present

## 2019-04-23 DIAGNOSIS — S0181XD Laceration without foreign body of other part of head, subsequent encounter: Secondary | ICD-10-CM | POA: Diagnosis not present

## 2019-04-23 DIAGNOSIS — W19XXXD Unspecified fall, subsequent encounter: Secondary | ICD-10-CM | POA: Diagnosis not present

## 2019-04-23 NOTE — Telephone Encounter (Signed)
Copied from [place] [id number]. Topic: Referral - Request for Referral >> Apr 23, 2019 11:36 AM Rutherford Nail, NT wrote: Has patient seen PCP for this complaint? Yes.   *If NO, is insurance requiring patient see PCP for this issue before PCP can refer them? Referral for which specialty: Wound Care Preferred provider/office:  Reason for referral: Patient states that she has an appointment tomorrow (04/24/2019) at 8:30am for wound care. States that she is needing a referral for that office so they can get paid by her insurance. Please advise. Would like a call today to let her know if she needs to reschedule with them while waiting on the referral to be placed. CB#: [phone]

## 2019-04-23 NOTE — Telephone Encounter (Signed)
Copied from [place] [id number]. Topic: General - Other >> Apr 23, 2019  8:07 AM Oneta Rack wrote: Caller name: Judson Roch  Relation to pt: RN [facility]  Call back number: [phone]    Reason for call:  Start of care today ordered by [facility] wound care center but patient refused to go to [facility] wound center due patient insurance not covering [facility]. Nurse requesting verbal orders from PCP to start of care

## 2019-04-23 NOTE — Telephone Encounter (Signed)
I previously placed a referral though I will place another one. Please contact the wound care center to let them know I placed this. Please find out if I will need to place a referral every time she needs an appointment.

## 2019-04-24 ENCOUNTER — Other Ambulatory Visit: Payer: Self-pay

## 2019-04-24 ENCOUNTER — Encounter: Payer: Medicare HMO | Admitting: Physician Assistant

## 2019-04-24 DIAGNOSIS — I10 Essential (primary) hypertension: Secondary | ICD-10-CM | POA: Diagnosis not present

## 2019-04-24 DIAGNOSIS — Z8349 Family history of other endocrine, nutritional and metabolic diseases: Secondary | ICD-10-CM | POA: Diagnosis not present

## 2019-04-24 DIAGNOSIS — F319 Bipolar disorder, unspecified: Secondary | ICD-10-CM | POA: Diagnosis not present

## 2019-04-24 DIAGNOSIS — S0101XA Laceration without foreign body of scalp, initial encounter: Secondary | ICD-10-CM | POA: Diagnosis not present

## 2019-04-24 DIAGNOSIS — Z8249 Family history of ischemic heart disease and other diseases of the circulatory system: Secondary | ICD-10-CM | POA: Diagnosis not present

## 2019-04-24 DIAGNOSIS — Z87891 Personal history of nicotine dependence: Secondary | ICD-10-CM | POA: Diagnosis not present

## 2019-04-24 DIAGNOSIS — E039 Hypothyroidism, unspecified: Secondary | ICD-10-CM | POA: Diagnosis not present

## 2019-04-24 DIAGNOSIS — I252 Old myocardial infarction: Secondary | ICD-10-CM | POA: Diagnosis not present

## 2019-04-24 NOTE — Telephone Encounter (Signed)
Noted  

## 2019-04-24 NOTE — Telephone Encounter (Signed)
Pt states she has already had the appt and the second one is ok to leave but the home health nurse states she is healing so fast it may/maynot be needed.  Amy Mcconnell,cma

## 2019-04-25 NOTE — Progress Notes (Signed)
PAULA, BUSENBARK ([id number]) Visit Report for 04/24/2019 Arrival Information Details Patient Name: Amy Mcconnell, Amy Mcconnell Date of Service: 04/24/2019 8:30 AM Medical Record Number: [id number] Patient Account Number: [id number] Date of Birth/Sex: Dec 24, 1950 (67 y.o. F) Treating RN: Harold Barban Primary Care Pinkey Mcjunkin: Caryl Bis, ERIC Other Clinician: Referring Kendric Sindelar: Caryl Bis, ERIC Treating Detroit Frieden/Extender: Melburn Hake, HOYT Weeks in Treatment: 0 Visit Information History Since Last Visit Added or deleted any medications: No Patient Arrived: Ambulatory Any new allergies or adverse reactions: No Arrival Time: 08:46 Had a fall or experienced change in No Accompanied By: self activities of daily living that may affect Transfer Assistance: None risk of falls: Patient Identification Verified: Yes Signs or symptoms of abuse/neglect since last visito No Secondary Verification Process Completed: Yes Hospitalized since last visit: No Has Dressing in Place as Prescribed: Yes Pain Present Now: No Electronic Signature(s) Signed: 04/24/2019 4:04:00 PM By: Harold Barban Entered By: Harold Barban on 04/24/2019 08:47:20 Amy Mcconnell ([id number]) -------------------------------------------------------------------------------- Clinic Level of Care Assessment Details Patient Name: Amy Mcconnell Date of Service: 04/24/2019 8:30 AM Medical Record Number: [id number] Patient Account Number: [id number] Date of Birth/Sex: [birth year] (67 y.o. F) Treating RN: Montey Hora Primary Care Chidera Thivierge: Caryl Bis, ERIC Other Clinician: Referring Pami Wool: Caryl Bis, ERIC Treating Felise Georgia/Extender: Melburn Hake, HOYT Weeks in Treatment: 0 Clinic Level of Care Assessment Items TOOL 4 Quantity Score []  - Use when only an EandM is performed on FOLLOW-UP visit 0 ASSESSMENTS - Nursing Assessment / Reassessment X - Reassessment of Co-morbidities (includes updates in patient status) 1 10 X- 1  5 Reassessment of Adherence to Treatment Plan ASSESSMENTS - Wound and Skin Assessment / Reassessment X - Simple Wound Assessment / Reassessment - one wound 1 5 []  - 0 Complex Wound Assessment / Reassessment - multiple wounds []  - 0 Dermatologic / Skin Assessment (not related to wound area) ASSESSMENTS - Focused Assessment []  - Circumferential Edema Measurements - multi extremities 0 []  - 0 Nutritional Assessment / Counseling / Intervention []  - 0 Lower Extremity Assessment (monofilament, tuning fork, pulses) []  - 0 Peripheral Arterial Disease Assessment (using hand held doppler) ASSESSMENTS - Ostomy and/or Continence Assessment and Care []  - Incontinence Assessment and Management 0 []  - 0 Ostomy Care Assessment and Management (repouching, etc.) PROCESS - Coordination of Care X - Simple Patient / Family Education for ongoing care 1 15 []  - 0 Complex (extensive) Patient / Family Education for ongoing care X- 1 10 Staff obtains Programmer, systems, Records, Test Results / Process Orders []  - 0 Staff telephones HHA, Nursing Homes / Clarify orders / etc []  - 0 Routine Transfer to another Facility (non-emergent condition) []  - 0 Routine Hospital Admission (non-emergent condition) []  - 0 New Admissions / Biomedical engineer / Ordering NPWT, Apligraf, etc. []  - 0 Emergency Hospital Admission (emergent condition) X- 1 10 Simple Discharge Coordination Amy Mcconnell, Amy Mcconnell ([id number]) []  - 0 Complex (extensive) Discharge Coordination PROCESS - Special Needs []  - Pediatric / Minor Patient Management 0 []  - 0 Isolation Patient Management []  - 0 Hearing / Language / Visual special needs []  - 0 Assessment of Community assistance (transportation, D/C planning, etc.) []  - 0 Additional assistance / Altered mentation []  - 0 Support Surface(s) Assessment (bed, cushion, seat, etc.) INTERVENTIONS - Wound Cleansing / Measurement X - Simple Wound Cleansing - one wound 1 5 []  - 0 Complex Wound  Cleansing - multiple wounds X- 1 5 Wound Imaging (photographs - any number of wounds) []  - 0 Wound Tracing (instead of photographs) X- 1 5 Simple Wound  Measurement - one wound []  - 0 Complex Wound Measurement - multiple wounds INTERVENTIONS - Wound Dressings X - Small Wound Dressing one or multiple wounds 1 10 []  - 0 Medium Wound Dressing one or multiple wounds []  - 0 Large Wound Dressing one or multiple wounds X- 1 5 Application of Medications - topical []  - 0 Application of Medications - injection INTERVENTIONS - Miscellaneous []  - External ear exam 0 []  - 0 Specimen Collection (cultures, biopsies, blood, body fluids, etc.) []  - 0 Specimen(s) / Culture(s) sent or taken to Lab for analysis []  - 0 Patient Transfer (multiple staff / Civil Service fast streamer / Similar devices) []  - 0 Simple Staple / Suture removal (25 or less) X- 1 10 Complex Staple / Suture removal (26 or more) []  - 0 Hypo / Hyperglycemic Management (close monitor of Blood Glucose) []  - 0 Ankle / Brachial Index (ABI) - do not check if billed separately X- 1 5 Vital Signs Amy Mcconnell, [name] ([id number]) Has the patient been seen at the hospital within the last three years: Yes Total Score: 100 Level Of Care: New/Established - Level 3 Electronic Signature(s) Signed: 04/24/2019 5:01:18 PM By: Montey Hora Entered By: Montey Hora on 04/24/2019 09:33:46 Amy Mcconnell ([id number]) -------------------------------------------------------------------------------- Lower Extremity Assessment Details Patient Name: Amy Mcconnell Date of Service: 04/24/2019 8:30 AM Medical Record Number: [id number] Patient Account Number: [id number] Date of Birth/Sex: [birth year] (67 y.o. F) Treating RN: Harold Barban Primary Care Destanee Bedonie: Caryl Bis, ERIC Other Clinician: Referring Dayven Linsley: Caryl Bis, ERIC Treating Alayjah Boehringer/Extender: Melburn Hake, HOYT Weeks in Treatment: 0 Electronic Signature(s) Signed: 04/24/2019 4:04:00 PM  By: Harold Barban Entered By: Harold Barban on 04/24/2019 08:49:12 Amy Mcconnell ([id number]) -------------------------------------------------------------------------------- Multi Wound Chart Details Patient Name: Amy Mcconnell Date of Service: 04/24/2019 8:30 AM Medical Record Number: [id number] Patient Account Number: [id number] Date of Birth/Sex: 1951-02-11 (67 y.o. F) Treating RN: Montey Hora Primary Care Einer Meals: Caryl Bis, ERIC Other Clinician: Referring Tabor Bartram: Caryl Bis, ERIC Treating Isaul Landi/Extender: Melburn Hake, HOYT Weeks in Treatment: 0 Vital Signs Height(in): 67 Pulse(bpm): 66 Weight(lbs): 142 Blood Pressure(mmHg): 126/69 Body Mass Index(BMI): 22 Temperature(F): 98.4 Respiratory Rate 18 (breaths/min): Photos: [N/A:N/A] Wound Location: Left Head - frontal N/A N/A Wounding Event: Trauma N/A N/A Primary Etiology: Trauma, Other N/A N/A Comorbid History: Hypertension N/A N/A Date Acquired: 04/08/2019 N/A N/A Weeks of Treatment: 0 N/A N/A Wound Status: Open N/A N/A Measurements L x W x D 0.1x0.1x0.4 N/A N/A (cm) Area (cm) : 0.008 N/A N/A Volume (cm) : 0.003 N/A N/A % Reduction in Area: 50.00% N/A N/A % Reduction in Volume: 50.00% N/A N/A Classification: Full Thickness Without N/A N/A Exposed Support Structures Exudate Amount: Medium N/A N/A Exudate Type: Serosanguineous N/A N/A Exudate Color: red, brown N/A N/A Wound Margin: Well defined, not attached N/A N/A Granulation Amount: None Present (0%) N/A N/A Necrotic Amount: None Present (0%) N/A N/A Exposed Structures: Fat Layer (Subcutaneous N/A N/A Tissue) Exposed: Yes Fascia: No Tendon: No Muscle: No Joint: No Bone: No Epithelialization: None N/A N/A Amy Mcconnell, Amy Mcconnell ([id number]) Treatment Notes Electronic Signature(s) Signed: 04/24/2019 5:01:18 PM By: Montey Hora Entered By: Montey Hora on 04/24/2019 09:03:44 Amy Mcconnell  ([id number]) -------------------------------------------------------------------------------- Multi-Disciplinary Care Plan Details Patient Name: Amy Mcconnell Date of Service: 04/24/2019 8:30 AM Medical Record Number: [id number] Patient Account Number: [id number] Date of Birth/Sex: Mar 20, 1951 (67 y.o. F) Treating RN: Montey Hora Primary Care Lemar Bakos: Caryl Bis, ERIC Other Clinician: Referring Porcia Morganti: Caryl Bis, ERIC Treating Ianmichael Amescua/Extender: Melburn Hake, HOYT Weeks in Treatment: 0 Active Inactive Abuse / Safety /  Falls / Self Care Management Nursing Diagnoses: History of Falls Goals: Patient will not experience any injury related to falls Date Initiated: 04/24/2019 Target Resolution Date: 07/11/2019 Goal Status: Active Interventions: Assess fall risk on admission and as needed Notes: Orientation to the Wound Care Program Nursing Diagnoses: Knowledge deficit related to the wound healing center program Goals: Patient/caregiver will verbalize understanding of the [place] Date Initiated: 04/24/2019 Target Resolution Date: 07/11/2019 Goal Status: Active Interventions: Provide education on orientation to the wound center Notes: Pain, Acute or Chronic Nursing Diagnoses: Pain, acute or chronic: actual or potential Goals: Patient/caregiver will verbalize comfort level met Date Initiated: 04/24/2019 Target Resolution Date: 07/11/2019 Goal Status: Active Interventions: Assess comfort goal upon admission Amy Mcconnell, Amy Mcconnell ([id number]) Notes: Wound/Skin Impairment Nursing Diagnoses: Impaired tissue integrity Goals: Ulcer/skin breakdown will have a volume reduction of 30% by week 4 Date Initiated: 04/20/2019 Target Resolution Date: 05/20/2019 Goal Status: Active Interventions: Assess patient/caregiver ability to obtain necessary supplies Assess patient/caregiver ability to perform ulcer/skin care regimen upon admission and as needed Assess ulceration(s)  every visit Provide education on ulcer and skin care Notes: Electronic Signature(s) Signed: 04/24/2019 5:01:18 PM By: Montey Hora Entered By: Montey Hora on 04/24/2019 09:04:44 Amy Mcconnell ([id number]) -------------------------------------------------------------------------------- Pain Assessment Details Patient Name: Amy Mcconnell Date of Service: 04/24/2019 8:30 AM Medical Record Number: [id number] Patient Account Number: [id number] Date of Birth/Sex: [birth year] (67 y.o. F) Treating RN: Harold Barban Primary Care Zackerie Sara: Caryl Bis, ERIC Other Clinician: Referring Marquiz Sotelo: Caryl Bis, ERIC Treating Almalik Weissberg/Extender: Melburn Hake, HOYT Weeks in Treatment: 0 Active Problems Location of Pain Severity and Description of Pain Patient Has Paino No Site Locations Pain Management and Medication Current Pain Management: Electronic Signature(s) Signed: 04/24/2019 4:04:00 PM By: Harold Barban Entered By: Harold Barban on 04/24/2019 08:47:27 Amy Mcconnell ([id number]) -------------------------------------------------------------------------------- Wound Assessment Details Patient Name: Amy Mcconnell Date of Service: 04/24/2019 8:30 AM Medical Record Number: [id number] Patient Account Number: [id number] Date of Birth/Sex: 02/21/1951 (67 y.o. F) Treating RN: Harold Barban Primary Care Zohaib Heeney: Caryl Bis, ERIC Other Clinician: Referring Emmerson Taddei: Caryl Bis, ERIC Treating Lonn Im/Extender: STONE III, HOYT Weeks in Treatment: 0 Wound Status Wound Number: 1 Primary Etiology: Trauma, Other Wound Location: Left Head - frontal Wound Status: Open Wounding Event: Trauma Comorbid History: Hypertension Date Acquired: 04/08/2019 Weeks Of Treatment: 0 Clustered Wound: No Photos Wound Measurements Length: (cm) 0.1 Width: (cm) 0.1 Depth: (cm) 0.4 Area: (cm) 0.008 Volume: (cm) 0.003 % Reduction in Area: 50% % Reduction in Volume:  50% Epithelialization: None Tunneling: No Undermining: No Wound Description Full Thickness Without Exposed Support Foul Odo Classification: Structures Slough/F Wound Margin: Well defined, not attached Exudate Medium Amount: Exudate Type: Serosanguineous Exudate Color: red, brown r After Cleansing: No ibrino No Wound Bed Granulation Amount: None Present (0%) Exposed Structure Necrotic Amount: None Present (0%) Fascia Exposed: No Fat Layer (Subcutaneous Tissue) Exposed: Yes Tendon Exposed: No Muscle Exposed: No Joint Exposed: No Bone Exposed: No Amy Mcconnell, Amy Mcconnell ([id number]) Electronic Signature(s) Signed: 04/24/2019 4:04:00 PM By: Harold Barban Entered By: Harold Barban on 04/24/2019 08:56:49 Amy Mcconnell ([id number]) -------------------------------------------------------------------------------- [facility] Details Patient Name: Amy Mcconnell Date of Service: 04/24/2019 8:30 AM Medical Record Number: [id number] Patient Account Number: [id number] Date of Birth/Sex: [birth year] (67 y.o. F) Treating RN: Harold Barban Primary Care Earnestene Angello: Caryl Bis, ERIC Other Clinician: Referring Mehek Grega: Caryl Bis, ERIC Treating Jathen Sudano/Extender: Melburn Hake, HOYT Weeks in Treatment: 0 Vital Signs Time Taken: 08:40 Temperature (F): 98.4 Height (in): 67 Pulse (bpm): 66 Weight (lbs): 142 Respiratory Rate (breaths/min): 18  Body Mass Index (BMI): 22.2 Blood Pressure (mmHg): 126/69 Reference Range: 80 - 120 mg / dl Electronic Signature(s) Signed: 04/24/2019 4:04:00 PM By: Harold Barban Entered By: Harold Barban on 04/24/2019 08:49:03

## 2019-04-25 NOTE — Progress Notes (Signed)
Amy Mcconnell, Amy Mcconnell ([id number]) Visit Report for 04/24/2019 Chief Complaint Document Details Patient Name: Amy Mcconnell, Amy Mcconnell Date of Service: 04/24/2019 8:30 AM Medical Record Number: [id number] Patient Account Number: [id number] Date of Birth/Sex: 1951-02-01 (68 y.o. F) Treating RN: Montey Hora Primary Care Provider: Caryl Bis, ERIC Other Clinician: Referring Provider: Caryl Bis, ERIC Treating Provider/Extender: Melburn Hake, Laurna Shetley Weeks in Treatment: 0 Information Obtained from: Patient Chief Complaint Scalp laceration Electronic Signature(s) Signed: 04/24/2019 5:06:58 PM By: Worthy Keeler PA-C Entered By: Worthy Keeler on 04/24/2019 08:54:18 Amy Mcconnell ([id number]) -------------------------------------------------------------------------------- HPI Details Patient Name: Amy Mcconnell Date of Service: 04/24/2019 8:30 AM Medical Record Number: [id number] Patient Account Number: [id number] Date of Birth/Sex: 08-Jan-1951 (68 y.o. F) Treating RN: Montey Hora Primary Care Provider: Caryl Bis, ERIC Other Clinician: Referring Provider: Caryl Bis, ERIC Treating Provider/Extender: Melburn Hake, Owynn Mosqueda Weeks in Treatment: 0 History of Present Illness HPI Description: 04/20/19 on evaluation today patient presents for initial evaluation our clinic concerning the laceration that she has extending from her left temple region across the upper portion of her forehead into the scalp into the posterior portion of her head. This occurred secondary to a fall which occurred on April 08, 2019. Subsequently she was seen in emergency department where this was cleaned and staple close that seems to have done very well for her. Fortunately there's no signs of active infection at this time. There does not appear to be signs of significant issue other than the fact that the blood collection which is around the left temple and Henrene Pastor orbital region has begun to leak out of a small opening in the wound  at the temple location which is caused a significant drainage unfortunately. It does appear that she is very close to the time when we need to move the sutures staples but I would like to actually get this likely just a few more days I will probably bring her back on Friday in order to undertake this. No fevers, chills, nausea, or vomiting noted at this time. 04/24/19 on evaluation today patient actually appears to be doing better in regard to her scalp wound in the region over the left for head and. Orbital area. She's not having any drainage like what she did when I saw her back on Monday fortunately this is doing much better. I do think the staples are ready to come out at this point. Overall there's no signs of active infection currently. 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 Nonetheless I'm not sure if this is going to be something that even is reversible or not she does want to know who she could see to try to see if anything could be done in that regard. Electronic Signature(s) Signed: 04/24/2019 5:06:58 PM By: Worthy Keeler PA-C Entered By: Worthy Keeler on 04/24/2019 10:32:19 Amy Mcconnell ([id number]) -------------------------------------------------------------------------------- Physical Exam Details Patient Name: Amy Mcconnell Date of Service: 04/24/2019 8:30 AM Medical Record Number: [id number] Patient Account Number: [id number] Date of Birth/Sex: 04-23-1951 (68 y.o. F) Treating RN: Montey Hora Primary Care Provider: Caryl Bis, ERIC Other Clinician: Referring Provider: Caryl Bis, ERIC Treating Provider/Extender: STONE III, Emanual Lamountain Weeks in Treatment: 0 Constitutional Well-nourished and well-hydrated in no acute distress. Respiratory normal breathing without difficulty. clear to auscultation  bilaterally. Cardiovascular regular rate and rhythm with normal S1, S2. Psychiatric this patient is able to  make decisions and demonstrates good insight into disease process. Alert and Oriented x 3. pleasant and cooperative. Notes On evaluation today patient's wound actually appears to be doing great and I do think we need to go ahead and remove the staples at this time. We did go through the process of removing the Staples very carefully to ensure that we got all of them. In the end there actually 34 staples removed. In the beginning I was only able to fine 28. However underneath one of the reasons that was scabbed where it seems like the most trauma occurred there were several more in this location totaling 34 altogether. Nonetheless we were able to get everything out and it appears that the suture line is very well healed and appears to be doing excellent at this point. Electronic Signature(s) Signed: 04/24/2019 5:06:58 PM By: Worthy Keeler PA-C Entered By: Worthy Keeler on 04/24/2019 10:33:22 Amy Mcconnell ([id number]) -------------------------------------------------------------------------------- Physician Orders Details Patient Name: Amy Mcconnell Date of Service: 04/24/2019 8:30 AM Medical Record Number: [id number] Patient Account Number: [id number] Date of Birth/Sex: [birth year] (68 y.o. F) Treating RN: Montey Hora Primary Care Provider: Caryl Bis, ERIC Other Clinician: Referring Provider: Caryl Bis, ERIC Treating Provider/Extender: Melburn Hake, Qamar Aughenbaugh Weeks in Treatment: 0 Verbal / Phone Orders: No Diagnosis Coding ICD-10 Coding Code Description S01.80XA Unspecified open wound of other part of head, initial encounter I10 Essential (primary) hypertension E03.9 Hypothyroidism, unspecified F31.89 Other bipolar disorder Wound Cleansing Wound #1 Left Head - frontal o May Shower, gently pat wound dry prior to applying new dressing. - Please use baby shampoo when  washing the area and please do not scratch Primary Wound Dressing Wound #1 Left Head - frontal o Other: - antibiotic ointment to staple line Dressing Change Frequency Wound #1 Left Head - frontal o Change dressing every day. Follow-up Appointments Wound #1 Left Head - frontal o Return Appointment in 1 week. Home Health Wound #1 Left Head - frontal o [place] Nurse may visit PRN to address patientos wound care needs. o FACE TO FACE ENCOUNTER: MEDICARE and MEDICAID PATIENTS: I certify that this patient is under my care and that I had a face-to-face encounter that meets the physician face-to-face encounter requirements with this patient on this date. The encounter with the patient was in whole or in part for the following MEDICAL CONDITION: (primary reason for [place]) MEDICAL NECESSITY: I certify, that based on my findings, NURSING services are a medically necessary home health service. HOME BOUND STATUS: I certify that my clinical findings support that this patient is homebound (i.e., Due to illness or injury, pt requires aid of supportive devices such as crutches, cane, wheelchairs, walkers, the use of special transportation or the assistance of another person to leave their place of residence. There is a normal inability to leave the home and doing so requires considerable and taxing effort. Other absences are for medical reasons / religious services and are infrequent or of short duration when for other reasons). o If current dressing causes regression in wound condition, may D/C ordered dressing product/s and apply Normal Saline Moist Dressing daily until next [place] / Other MD appointment. [place] of regression in wound condition at [phone]. o Please direct any NON-WOUND related issues/requests for orders to patient's Primary Care Physician RAHA, TENNISON ([id number]) Electronic  Signature(s) Signed: 04/24/2019 5:01:18 PM By: Montey Hora Signed: 04/24/2019 5:06:58 PM By: Worthy Keeler PA-C Entered By: Montey Hora  on 04/24/2019 09:32:25 Amy Mcconnell, Amy Mcconnell ([id number]) -------------------------------------------------------------------------------- Problem List Details Patient Name: AVAIYAH, STRUBEL Date of Service: 04/24/2019 8:30 AM Medical Record Number: [id number] Patient Account Number: [id number] Date of Birth/Sex: June 15, 1951 (68 y.o. F) Treating RN: Montey Hora Primary Care Provider: Caryl Bis, ERIC Other Clinician: Referring Provider: Caryl Bis, ERIC Treating Provider/Extender: Melburn Hake, Kaeli Nichelson Weeks in Treatment: 0 Active Problems ICD-10 Evaluated Encounter Code Description Active Date Today Diagnosis S01.80XA Unspecified open wound of other part of head, initial 04/20/2019 No Yes encounter [place] (primary) hypertension 04/20/2019 No Yes E03.9 Hypothyroidism, unspecified 04/20/2019 No Yes F31.89 Other bipolar disorder 04/20/2019 No Yes Inactive Problems Resolved Problems Electronic Signature(s) Signed: 04/24/2019 5:06:58 PM By: Worthy Keeler PA-C Entered By: Worthy Keeler on 04/24/2019 08:54:13 Amy Mcconnell ([id number]) -------------------------------------------------------------------------------- Progress Note Details Patient Name: Amy Mcconnell Date of Service: 04/24/2019 8:30 AM Medical Record Number: [id number] Patient Account Number: [id number] Date of Birth/Sex: Aug 23, 1951 (68 y.o. F) Treating RN: Montey Hora Primary Care Provider: Caryl Bis, ERIC Other Clinician: Referring Provider: Caryl Bis, ERIC Treating Provider/Extender: Melburn Hake, Trinka Keshishyan Weeks in Treatment: 0 Subjective Chief Complaint Information obtained from Patient Scalp laceration History of Present Illness (HPI) 04/20/19 on evaluation today patient presents for initial evaluation our clinic concerning the laceration that she has  extending from her left temple region across the upper portion of her forehead into the scalp into the posterior portion of her head. This occurred secondary to a fall which occurred on April 08, 2019. Subsequently she was seen in emergency department where this was cleaned and staple close that seems to have done very well for her. Fortunately there's no signs of active infection at this time. There does not appear to be signs of significant issue other than the fact that the blood collection which is around the left temple and Henrene Pastor orbital region has begun to leak out of a small opening in the wound at the temple location which is caused a significant drainage unfortunately. It does appear that she is very close to the time when we need to move the sutures staples but I would like to actually get this likely just a few more days I will probably bring her back on Friday in order to undertake this. No fevers, chills, nausea, or vomiting noted at this time. 04/24/19 on evaluation today patient actually appears to be doing better in regard to her scalp wound in the region over the left for head and. Orbital area. She's not having any drainage like what she did when I saw her back on Monday fortunately this is doing much better. I do think the staples are ready to come out at this point. Overall there's no signs of active infection currently. 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 Nonetheless I'm not sure if this is going to be something that even is reversible or not she does want to know who she could see to try to see if anything could be done in that regard. Patient History Information obtained from Patient. Family History Heart Disease - Siblings,Paternal Grandparents, Hypertension - Paternal Grandparents,Siblings, Thyroid Problems -  Maternal Grandparents,Father,Siblings, No family history of Cancer, Diabetes, Kidney Disease, Lung Disease, Seizures, Stroke, Tuberculosis. Social History Former smoker - Quit 20+ years, Marital Status - Divorced, Alcohol Use - Never, Drug Use - No History, Caffeine Use - Daily.  Medical History Cardiovascular Patient has history of Hypertension Denies history of Angina, Arrhythmia, Congestive Heart Failure, Coronary Artery Disease, Deep Vein Thrombosis, Hypotension, Myocardial Infarction, Peripheral Arterial Disease, Peripheral Venous Disease, Phlebitis, Vasculitis Endocrine Denies history of Type I Diabetes, Type II Diabetes Integumentary (Skin) Denies history of History of Burn, History of pressure wounds Medical And Surgical History Notes Amy Mcconnell, Amy Mcconnell ([id number]) Psychiatric Bipolar Review of Systems (ROS) Constitutional Symptoms (General Health) Denies complaints or symptoms of Fatigue, Fever, Chills, Marked Weight Change. Respiratory Denies complaints or symptoms of Chronic or frequent coughs, Shortness of Breath. Cardiovascular Denies complaints or symptoms of Chest pain, LE edema. Psychiatric Denies complaints or symptoms of Anxiety, Claustrophobia. Objective Constitutional Well-nourished and well-hydrated in no acute distress. Vitals Time Taken: 8:40 AM, Height: 67 in, Weight: 142 lbs, BMI: 22.2, Temperature: 98.4 F, Pulse: 66 bpm, Respiratory Rate: 18 breaths/min, Blood Pressure: 126/69 mmHg. Respiratory normal breathing without difficulty. clear to auscultation bilaterally. Cardiovascular regular rate and rhythm with normal S1, S2. Psychiatric this patient is able to make decisions and demonstrates good insight into disease process. Alert and Oriented x 3. pleasant and cooperative. General Notes: On evaluation today patient's wound actually appears to be doing great and I do think we need to go ahead and remove the staples at this time. We did go through the  process of removing the Staples very carefully to ensure that we got all of them. In the end there actually 34 staples removed. In the beginning I was only able to fine 28. However underneath one of the reasons that was scabbed where it seems like the most trauma occurred there were several more in this location totaling 34 altogether. Nonetheless we were able to get everything out and it appears that the suture line is very well healed and appears to be doing excellent at this point. Integumentary (Hair, Skin) Wound #1 status is Open. Original cause of wound was Trauma. The wound is located on the Left Head - frontal. The wound measures 0.1cm length x 0.1cm width x 0.4cm depth; 0.008cm^2 area and 0.003cm^3 volume. There is Fat Layer (Subcutaneous Tissue) Exposed exposed. There is no tunneling or undermining noted. There is a medium amount of serosanguineous drainage noted. The wound margin is well defined and not attached to the wound base. There is no granulation within the wound bed. There is no necrotic tissue within the wound bed. Amy Mcconnell, Amy Mcconnell ([id number]) Assessment Active Problems ICD-10 Unspecified open wound of other part of head, initial encounter Essential (primary) hypertension Hypothyroidism, unspecified Other bipolar disorder Plan Wound Cleansing: Wound #1 Left Head - frontal: May Shower, gently pat wound dry prior to applying new dressing. - Please use baby shampoo when washing the area and please do not scratch Primary Wound Dressing: Wound #1 Left Head - frontal: Other: - antibiotic ointment to staple line Dressing Change Frequency: Wound #1 Left Head - frontal: Change dressing every day. Follow-up Appointments: Wound #1 Left Head - frontal: Return Appointment in 1 week. Home Health: Wound #1 Left Head - frontal: [place] Nurse may visit PRN to address patient s wound care needs. FACE TO FACE ENCOUNTER: MEDICARE and MEDICAID  PATIENTS: I certify that this patient is under my care and that I had a face-to-face encounter that meets the physician face-to-face encounter requirements with this patient on this date. The encounter with the patient was in whole or in part for the following MEDICAL CONDITION: (primary reason for [place]) MEDICAL NECESSITY: I certify,  that based on my findings, NURSING services are a medically necessary home health service. HOME BOUND STATUS: I certify that my clinical findings support that this patient is homebound (i.e., Due to illness or injury, pt requires aid of supportive devices such as crutches, cane, wheelchairs, walkers, the use of special transportation or the assistance of another person to leave their place of residence. There is a normal inability to leave the home and doing so requires considerable and taxing effort. Other absences are for medical reasons / religious services and are infrequent or of short duration when for other reasons). If current dressing causes regression in wound condition, may D/C ordered dressing product/s and apply Normal Saline Moist Dressing daily until next [place] / Other MD appointment. [place] of regression in wound condition at [phone]. Please direct any NON-WOUND related issues/requests for orders to patient's Primary Care Physician At this point I think that the best thing is gonna be just to use the knee is born ointment over the laceration edge word is healing. She's in agreement this plan. I do not think that there's any additional need for whom a pressure dressing which is good news since this has been causing her headaches. Subsequently we're gonna see were things stand at follow-up. Please see above for specific wound care orders. We will see patient for re-evaluation in 1 week(s) here in the clinic. If anything worsens or changes patient will contact our office for additional recommendations. if  she is doing as well as expected that point we will likely discharge her from wound care services. Amy Mcconnell, Amy Mcconnell ([id number]) Electronic Signature(s) Signed: 04/24/2019 5:06:58 PM By: Worthy Keeler PA-C Entered By: Worthy Keeler on 04/24/2019 10:36:43 Amy Mcconnell, Amy Mcconnell ([id number]) -------------------------------------------------------------------------------- ROS/PFSH Details Patient Name: Amy Mcconnell Date of Service: 04/24/2019 8:30 AM Medical Record Number: [id number] Patient Account Number: [id number] Date of Birth/Sex: December 04, 1950 (68 y.o. F) Treating RN: Montey Hora Primary Care Provider: Caryl Bis, ERIC Other Clinician: Referring Provider: Caryl Bis, ERIC Treating Provider/Extender: Melburn Hake, Asaf Elmquist Weeks in Treatment: 0 Information Obtained From Patient Constitutional Symptoms (General Health) Complaints and Symptoms: Negative for: Fatigue; Fever; Chills; Marked Weight Change Respiratory Complaints and Symptoms: Negative for: Chronic or frequent coughs; Shortness of Breath Cardiovascular Complaints and Symptoms: Negative for: Chest pain; LE edema Medical History: Positive for: Hypertension Negative for: Angina; Arrhythmia; Congestive Heart Failure; Coronary Artery Disease; Deep Vein Thrombosis; Hypotension; Myocardial Infarction; Peripheral Arterial Disease; Peripheral Venous Disease; Phlebitis; Vasculitis Psychiatric Complaints and Symptoms: Negative for: Anxiety; Claustrophobia Medical History: Past Medical History Notes: Bipolar Endocrine Medical History: Negative for: Type I Diabetes; Type II Diabetes Integumentary (Skin) Medical History: Negative for: History of Burn; History of pressure wounds Immunizations Pneumococcal Vaccine: Received Pneumococcal Vaccination: No Implantable Devices None Family and Social History Amy Mcconnell, Amy Mcconnell ([id number]) Cancer: No; Diabetes: No; Heart Disease: Yes - Siblings,Paternal Grandparents;  Hypertension: Yes - Paternal Grandparents,Siblings; Kidney Disease: No; Lung Disease: No; Seizures: No; Stroke: No; Thyroid Problems: Yes - Maternal Grandparents,Father,Siblings; Tuberculosis: No; Former smoker - Quit 20+ years; Marital Status - Divorced; Alcohol Use: Never; Drug Use: No History; Caffeine Use: Daily Physician Affirmation I have reviewed and agree with the above information. Electronic Signature(s) Signed: 04/24/2019 5:01:18 PM By: Montey Hora Signed: 04/24/2019 5:06:58 PM By: Worthy Keeler PA-C Entered By: Worthy Keeler on 04/24/2019 10:32:36 Amy Mcconnell ([id number]) -------------------------------------------------------------------------------- SuperBill Details Patient Name: Amy Mcconnell Date of Service: 04/24/2019 Medical Record Number: [id number] Patient Account Number: [id number] Date of Birth/Sex:  1951-10-07 (68 y.o. F) Treating RN: Montey Hora Primary Care Provider: Caryl Bis, ERIC Other Clinician: Referring Provider: Caryl Bis, ERIC Treating Provider/Extender: Melburn Hake, Montoya Watkin Weeks in Treatment: 0 Diagnosis Coding ICD-10 Codes Code Description S01.80XA Unspecified open wound of other part of head, initial encounter I10 Essential (primary) hypertension E03.9 Hypothyroidism, unspecified F31.89 Other bipolar disorder Facility Procedures CPT4 Code: [id number] Description: 99213 - WOUND CARE VISIT-LEV 3 EST PT Modifier: Quantity: 1 Physician Procedures CPT4 Code: [id number] Description: 00867 - WC PHYS LEVEL 4 - EST PT ICD-10 Diagnosis Description S01.80XA Unspecified open wound of other part of head, initial enc I10 Essential (primary) hypertension E03.9 Hypothyroidism, unspecified F31.89 Other bipolar disorder Modifier: ounter Quantity: 1 Electronic Signature(s) Signed: 04/24/2019 5:06:58 PM By: Worthy Keeler PA-C Entered By: Worthy Keeler on 04/24/2019 10:37:00

## 2019-05-01 ENCOUNTER — Other Ambulatory Visit: Payer: Self-pay

## 2019-05-01 ENCOUNTER — Telehealth: Payer: Self-pay | Admitting: *Deleted

## 2019-05-01 ENCOUNTER — Ambulatory Visit: Payer: Medicare HMO | Admitting: Physician Assistant

## 2019-05-01 ENCOUNTER — Encounter: Payer: Medicare HMO | Admitting: Physician Assistant

## 2019-05-01 DIAGNOSIS — F3189 Other bipolar disorder: Secondary | ICD-10-CM | POA: Diagnosis not present

## 2019-05-01 DIAGNOSIS — S0181XD Laceration without foreign body of other part of head, subsequent encounter: Secondary | ICD-10-CM | POA: Diagnosis not present

## 2019-05-01 DIAGNOSIS — S0101XA Laceration without foreign body of scalp, initial encounter: Secondary | ICD-10-CM | POA: Diagnosis not present

## 2019-05-01 DIAGNOSIS — W19XXXD Unspecified fall, subsequent encounter: Secondary | ICD-10-CM | POA: Diagnosis not present

## 2019-05-01 DIAGNOSIS — Z87891 Personal history of nicotine dependence: Secondary | ICD-10-CM | POA: Diagnosis not present

## 2019-05-01 DIAGNOSIS — Z8249 Family history of ischemic heart disease and other diseases of the circulatory system: Secondary | ICD-10-CM | POA: Diagnosis not present

## 2019-05-01 DIAGNOSIS — E039 Hypothyroidism, unspecified: Secondary | ICD-10-CM | POA: Diagnosis not present

## 2019-05-01 DIAGNOSIS — I252 Old myocardial infarction: Secondary | ICD-10-CM | POA: Diagnosis not present

## 2019-05-01 DIAGNOSIS — Z4800 Encounter for change or removal of nonsurgical wound dressing: Secondary | ICD-10-CM | POA: Diagnosis not present

## 2019-05-01 DIAGNOSIS — I10 Essential (primary) hypertension: Secondary | ICD-10-CM | POA: Diagnosis not present

## 2019-05-01 DIAGNOSIS — F319 Bipolar disorder, unspecified: Secondary | ICD-10-CM | POA: Diagnosis not present

## 2019-05-01 DIAGNOSIS — Z8349 Family history of other endocrine, nutritional and metabolic diseases: Secondary | ICD-10-CM | POA: Diagnosis not present

## 2019-05-01 NOTE — Telephone Encounter (Signed)
Copied from [place] [phone]. Topic: Referral - Request for Referral >> May 01, 2019 12:37 PM Erick Blinks wrote: Has patient seen PCP for this complaint? Yes  *If NO, is insurance requiring patient see PCP for this issue before PCP can refer them? Referral for which specialty: Wound care Preferred provider/office: Wound care center -[facility]  Reason for referral: Referral needed for insurance purposes  Please advise

## 2019-05-01 NOTE — Telephone Encounter (Signed)
Can you please contact the wound care center to see why I need to put another referral in? I previously placed 2 referrals.

## 2019-05-03 NOTE — Progress Notes (Signed)
Amy, Mcconnell ([id number]) Visit Report for 05/01/2019 Arrival Information Details Patient Name: Amy Mcconnell, Amy Mcconnell Date of Service: 05/01/2019 11:00 AM Medical Record Number: [id number] Patient Account Number: [id number] Date of Birth/Sex: Oct 02, 1951 (67 y.o. F) Treating RN: Cornell Barman Primary Care Tyaira Heward: Caryl Bis, ERIC Other Clinician: Referring Tamirah George: Caryl Bis, ERIC Treating Danialle Dement/Extender: Melburn Hake, HOYT Weeks in Treatment: 1 Visit Information History Since Last Visit Added or deleted any medications: No Patient Arrived: Ambulatory Any new allergies or adverse reactions: No Arrival Time: 11:05 Had a fall or experienced change in No Accompanied By: self activities of daily living that may affect Transfer Assistance: None risk of falls: Patient Identification Verified: Yes Signs or symptoms of abuse/neglect since last visito No Secondary Verification Process Completed: Yes Hospitalized since last visit: No Implantable device outside of the clinic excluding No cellular tissue based products placed in the center since last visit: Pain Present Now: No Electronic Signature(s) Signed: 05/01/2019 4:38:11 PM By: Gretta Cool, BSN, RN, CWS, Kim RN, BSN Entered By: Gretta Cool, BSN, RN, CWS, Kim on 05/01/2019 11:06:33 Amy Mcconnell ([id number]) -------------------------------------------------------------------------------- Clinic Level of Care Assessment Details Patient Name: Amy Mcconnell Date of Service: 05/01/2019 11:00 AM Medical Record Number: [id number] Patient Account Number: [id number] Date of Birth/Sex: [birth year] (67 y.o. F) Treating RN: Cornell Barman Primary Care Braiden Presutti: Caryl Bis, ERIC Other Clinician: Referring Josephanthony Tindel: Caryl Bis, ERIC Treating Lexys Milliner/Extender: Melburn Hake, HOYT Weeks in Treatment: 1 Clinic Level of Care Assessment Items TOOL 4 Quantity Score []  - Use when only an EandM is performed on FOLLOW-UP visit 0 ASSESSMENTS - Nursing Assessment  / Reassessment []  - Reassessment of Co-morbidities (includes updates in patient status) 0 X- 1 5 Reassessment of Adherence to Treatment Plan ASSESSMENTS - Wound and Skin Assessment / Reassessment X - Simple Wound Assessment / Reassessment - one wound 1 5 []  - 0 Complex Wound Assessment / Reassessment - multiple wounds []  - 0 Dermatologic / Skin Assessment (not related to wound area) ASSESSMENTS - Focused Assessment []  - Circumferential Edema Measurements - multi extremities 0 []  - 0 Nutritional Assessment / Counseling / Intervention []  - 0 Lower Extremity Assessment (monofilament, tuning fork, pulses) []  - 0 Peripheral Arterial Disease Assessment (using hand held doppler) ASSESSMENTS - Ostomy and/or Continence Assessment and Care []  - Incontinence Assessment and Management 0 []  - 0 Ostomy Care Assessment and Management (repouching, etc.) PROCESS - Coordination of Care X - Simple Patient / Family Education for ongoing care 1 15 []  - 0 Complex (extensive) Patient / Family Education for ongoing care []  - 0 Staff obtains Programmer, systems, Records, Test Results / Process Orders []  - 0 Staff telephones HHA, Nursing Homes / Clarify orders / etc []  - 0 Routine Transfer to another Facility (non-emergent condition) []  - 0 Routine Hospital Admission (non-emergent condition) []  - 0 New Admissions / Biomedical engineer / Ordering NPWT, Apligraf, etc. []  - 0 Emergency Hospital Admission (emergent condition) X- 1 10 Simple Discharge Coordination CALAIS, SVEHLA ([id number]) []  - 0 Complex (extensive) Discharge Coordination PROCESS - Special Needs []  - Pediatric / Minor Patient Management 0 []  - 0 Isolation Patient Management []  - 0 Hearing / Language / Visual special needs []  - 0 Assessment of Community assistance (transportation, D/C planning, etc.) []  - 0 Additional assistance / Altered mentation []  - 0 Support Surface(s) Assessment (bed, cushion, seat, etc.) INTERVENTIONS -  Wound Cleansing / Measurement X - Simple Wound Cleansing - one wound 1 5 []  - 0 Complex Wound Cleansing - multiple wounds X- 1 5 Wound Imaging (  photographs - any number of wounds) []  - 0 Wound Tracing (instead of photographs) X- 1 5 Simple Wound Measurement - one wound []  - 0 Complex Wound Measurement - multiple wounds INTERVENTIONS - Wound Dressings []  - Small Wound Dressing one or multiple wounds 0 []  - 0 Medium Wound Dressing one or multiple wounds X- 1 20 Large Wound Dressing one or multiple wounds X- 1 5 Application of Medications - topical []  - 0 Application of Medications - injection INTERVENTIONS - Miscellaneous []  - External ear exam 0 []  - 0 Specimen Collection (cultures, biopsies, blood, body fluids, etc.) []  - 0 Specimen(s) / Culture(s) sent or taken to Lab for analysis []  - 0 Patient Transfer (multiple staff / Civil Service fast streamer / Similar devices) []  - 0 Simple Staple / Suture removal (25 or less) []  - 0 Complex Staple / Suture removal (26 or more) []  - 0 Hypo / Hyperglycemic Management (close monitor of Blood Glucose) []  - 0 Ankle / Brachial Index (ABI) - do not check if billed separately X- 1 5 Vital Signs COOKIE, PORE ([id number]) Has the patient been seen at the hospital within the last three years: Yes Total Score: 80 Level Of Care: New/Established - Level 3 Electronic Signature(s) Signed: 05/01/2019 4:38:11 PM By: Gretta Cool, BSN, RN, CWS, Kim RN, BSN Entered By: Gretta Cool, BSN, RN, CWS, Kim on 05/01/2019 11:38:59 Amy Mcconnell ([id number]) -------------------------------------------------------------------------------- Encounter Discharge Information Details Patient Name: Amy Mcconnell Date of Service: 05/01/2019 11:00 AM Medical Record Number: [id number] Patient Account Number: [id number] Date of Birth/Sex: 09/15/1951 (67 y.o. F) Treating RN: Cornell Barman Primary Care Damesha Lawler: Caryl Bis, ERIC Other Clinician: Referring Chastelyn Athens: Caryl Bis,  ERIC Treating Sahasra Belue/Extender: Sharalyn Ink in Treatment: 1 Encounter Discharge Information Items Discharge Condition: Stable Ambulatory Status: Ambulatory Discharge Destination: Home Transportation: Private Auto Accompanied By: self Schedule Follow-up Appointment: Yes Clinical Summary of Care: Electronic Signature(s) Signed: 05/01/2019 4:38:11 PM By: Gretta Cool, BSN, RN, CWS, Kim RN, BSN Entered By: Gretta Cool, BSN, RN, CWS, Kim on 05/01/2019 11:40:25 Amy Mcconnell ([id number]) -------------------------------------------------------------------------------- Lower Extremity Assessment Details Patient Name: Amy Mcconnell Date of Service: 05/01/2019 11:00 AM Medical Record Number: [id number] Patient Account Number: [id number] Date of Birth/Sex: August 19, 1951 (67 y.o. F) Treating RN: Cornell Barman Primary Care Lukas Pelcher: Caryl Bis, ERIC Other Clinician: Referring Krimson Massmann: Caryl Bis, ERIC Treating Calayah Guadarrama/Extender: Sharalyn Ink in Treatment: 1 Electronic Signature(s) Signed: 05/01/2019 4:38:11 PM By: Gretta Cool, BSN, RN, CWS, Kim RN, BSN Entered By: Gretta Cool, BSN, RN, CWS, Kim on 05/01/2019 11:11:21 Amy Mcconnell ([id number]) -------------------------------------------------------------------------------- Multi Wound Chart Details Patient Name: Amy Mcconnell Date of Service: 05/01/2019 11:00 AM Medical Record Number: [id number] Patient Account Number: [id number] Date of Birth/Sex: 1951-07-15 (67 y.o. F) Treating RN: Cornell Barman Primary Care Sandria Mcenroe: Caryl Bis, ERIC Other Clinician: Referring Gerell Fortson: Caryl Bis, ERIC Treating Monick Rena/Extender: Melburn Hake, HOYT Weeks in Treatment: 1 Vital Signs Height(in): 67 Pulse(bpm): 59 Weight(lbs): 142 Blood Pressure(mmHg): 103/64 Body Mass Index(BMI): 22 Temperature(F): 98.7 Respiratory Rate 16 (breaths/min): Photos: [N/A:N/A] Wound Location: Left Head - frontal N/A N/A Wounding Event: Trauma N/A N/A Primary  Etiology: Trauma, Other N/A N/A Comorbid History: Hypertension N/A N/A Date Acquired: 04/08/2019 N/A N/A Weeks of Treatment: 1 N/A N/A Wound Status: Open N/A N/A Measurements L x W x D 0.1x0.1x0.1 N/A N/A (cm) Area (cm) : 0.008 N/A N/A Volume (cm) : 0.001 N/A N/A % Reduction in Area: 50.00% N/A N/A % Reduction in Volume: 83.30% N/A N/A Classification: Full Thickness Without N/A N/A Exposed Support Structures Exudate Amount: Medium N/A  N/A Exudate Type: Serosanguineous N/A N/A Exudate Color: red, brown N/A N/A Wound Margin: Well defined, not attached N/A N/A Granulation Amount: None Present (0%) N/A N/A Necrotic Amount: Large (67-100%) N/A N/A Exposed Structures: Fat Layer (Subcutaneous N/A N/A Tissue) Exposed: Yes Fascia: No Tendon: No Muscle: No Joint: No Bone: No Epithelialization: None N/A N/A IPEK, WESTRA ([id number]) Treatment Notes Electronic Signature(s) Signed: 05/01/2019 4:38:11 PM By: Gretta Cool, BSN, RN, CWS, Kim RN, BSN Entered By: Gretta Cool, BSN, RN, CWS, Kim on 05/01/2019 11:35:23 Amy Mcconnell ([id number]) -------------------------------------------------------------------------------- Multi-Disciplinary Care Plan Details Patient Name: Amy Mcconnell Date of Service: 05/01/2019 11:00 AM Medical Record Number: [id number] Patient Account Number: [id number] Date of Birth/Sex: 07-07-1951 (67 y.o. F) Treating RN: Cornell Barman Primary Care Rashell Shambaugh: Caryl Bis, ERIC Other Clinician: Referring Jasmeen Fritsch: Caryl Bis, ERIC Treating Suzan Manon/Extender: Melburn Hake, HOYT Weeks in Treatment: 1 Active Inactive Abuse / Safety / Falls / Self Care Management Nursing Diagnoses: History of Falls Goals: Patient will not experience any injury related to falls Date Initiated: 04/24/2019 Target Resolution Date: 07/11/2019 Goal Status: Active Interventions: Assess fall risk on admission and as needed Notes: Orientation to the Wound Care Program Nursing Diagnoses: Knowledge  deficit related to the wound healing center program Goals: Patient/caregiver will verbalize understanding of the [facility] Date Initiated: 04/24/2019 Target Resolution Date: 07/11/2019 Goal Status: Active Interventions: Provide education on orientation to the wound center Notes: Pain, Acute or Chronic Nursing Diagnoses: Pain, acute or chronic: actual or potential Goals: Patient/caregiver will verbalize comfort level met Date Initiated: 04/24/2019 Target Resolution Date: 07/11/2019 Goal Status: Active Interventions: Assess comfort goal upon admission TIFANI, DACK ([id number]) Notes: Wound/Skin Impairment Nursing Diagnoses: Impaired tissue integrity Goals: Ulcer/skin breakdown will have a volume reduction of 30% by week 4 Date Initiated: 04/20/2019 Target Resolution Date: 05/20/2019 Goal Status: Active Interventions: Assess patient/caregiver ability to obtain necessary supplies Assess patient/caregiver ability to perform ulcer/skin care regimen upon admission and as needed Assess ulceration(s) every visit Provide education on ulcer and skin care Notes: Electronic Signature(s) Signed: 05/01/2019 4:38:11 PM By: Gretta Cool, BSN, RN, CWS, Kim RN, BSN Entered By: Gretta Cool, BSN, RN, CWS, Kim on 05/01/2019 11:34:47 Amy Mcconnell ([id number]) -------------------------------------------------------------------------------- Pain Assessment Details Patient Name: Amy Mcconnell Date of Service: 05/01/2019 11:00 AM Medical Record Number: [id number] Patient Account Number: [id number] Date of Birth/Sex: 1951-02-06 (67 y.o. F) Treating RN: Cornell Barman Primary Care Jaxsen Bernhart: Caryl Bis, ERIC Other Clinician: Referring Sugar Vanzandt: Caryl Bis, ERIC Treating Kaleea Penner/Extender: Melburn Hake, HOYT Weeks in Treatment: 1 Active Problems Location of Pain Severity and Description of Pain Patient Has Paino No Site Locations Pain Management and Medication Current Pain  Management: Electronic Signature(s) Signed: 05/01/2019 4:38:11 PM By: Gretta Cool, BSN, RN, CWS, Kim RN, BSN Entered By: Gretta Cool, BSN, RN, CWS, Kim on 05/01/2019 11:06:40 Amy Mcconnell ([id number]) -------------------------------------------------------------------------------- Patient/Caregiver Education Details Patient Name: Amy Mcconnell Date of Service: 05/01/2019 11:00 AM Medical Record Number: [id number] Patient Account Number: [id number] Date of Birth/Gender: [birth year] (67 y.o. F) Treating RN: Cornell Barman Primary Care Physician: Caryl Bis, ERIC Other Clinician: Referring Physician: Caryl Bis, ERIC Treating Physician/Extender: Sharalyn Ink in Treatment: 1 Education Assessment Education Provided To: Patient Education Topics Provided Wound/Skin Impairment: Handouts: Caring for Your Ulcer Methods: Demonstration, Explain/Verbal Responses: State content correctly Electronic Signature(s) Signed: 05/01/2019 4:38:11 PM By: Gretta Cool, BSN, RN, CWS, Kim RN, BSN Entered By: Gretta Cool, BSN, RN, CWS, Kim on 05/01/2019 11:39:16 Amy Mcconnell ([id number]) -------------------------------------------------------------------------------- Wound Assessment Details Patient Name: Amy Mcconnell Date of Service: 05/01/2019 11:00 AM Medical  Record Number: [id number] Patient Account Number: [id number] Date of Birth/Sex: 05/07/1951 (67 y.o. F) Treating RN: Cornell Barman Primary Care Khai Torbert: Caryl Bis, ERIC Other Clinician: Referring Bayla Mcgovern: Caryl Bis, ERIC Treating Kodee Drury/Extender: Melburn Hake, HOYT Weeks in Treatment: 1 Wound Status Wound Number: 1 Primary Etiology: Trauma, Other Wound Location: Left Head - frontal Wound Status: Open Wounding Event: Trauma Comorbid History: Hypertension Date Acquired: 04/08/2019 Weeks Of Treatment: 1 Clustered Wound: No Photos Wound Measurements Length: (cm) 0.1 Width: (cm) 0.1 Depth: (cm) 0.1 Area: (cm) 0.008 Volume: (cm) 0.001 %  Reduction in Area: 50% % Reduction in Volume: 83.3% Epithelialization: None Wound Description Full Thickness Without Exposed Support Classification: Structures Wound Margin: Well defined, not attached Exudate Medium Amount: Exudate Type: Serosanguineous Exudate Color: red, brown Foul Odor After Cleansing: No Slough/Fibrino No Wound Bed Granulation Amount: None Present (0%) Exposed Structure Necrotic Amount: Large (67-100%) Fascia Exposed: No Fat Layer (Subcutaneous Tissue) Exposed: Yes Tendon Exposed: No Muscle Exposed: No Joint Exposed: No Bone Exposed: No RAELEA, GOSSE ([id number]) Treatment Notes Wound #1 (Left Head - frontal) Notes Surgicell, gauze, kerlix and coban to secure and apply pressure Electronic Signature(s) Signed: 05/01/2019 4:38:11 PM By: Gretta Cool, BSN, RN, CWS, Kim RN, BSN Entered By: Gretta Cool, BSN, RN, CWS, Kim on 05/01/2019 11:11:06 Amy Mcconnell ([id number]) -------------------------------------------------------------------------------- [place] Details Patient Name: Amy Mcconnell Date of Service: 05/01/2019 11:00 AM Medical Record Number: [id number] Patient Account Number: [id number] Date of Birth/Sex: November 29, 1950 (67 y.o. F) Treating RN: Cornell Barman Primary Care Nilo Fallin: Caryl Bis, ERIC Other Clinician: Referring Jaquaveon Bilal: Caryl Bis, ERIC Treating Palmer Shorey/Extender: Melburn Hake, HOYT Weeks in Treatment: 1 Vital Signs Time Taken: 11:06 Temperature (F): 98.7 Height (in): 67 Pulse (bpm): 59 Weight (lbs): 142 Respiratory Rate (breaths/min): 16 Body Mass Index (BMI): 22.2 Blood Pressure (mmHg): 103/64 Reference Range: 80 - 120 mg / dl Electronic Signature(s) Signed: 05/01/2019 4:38:11 PM By: Gretta Cool, BSN, RN, CWS, Kim RN, BSN Entered By: Gretta Cool, BSN, RN, CWS, Kim on 05/01/2019 31:54:00

## 2019-05-03 NOTE — Progress Notes (Signed)
KORISSA, HORSFORD ([id number]) Visit Report for 05/01/2019 Chief Complaint Document Details Patient Name: Amy Mcconnell, Amy Mcconnell Date of Service: 05/01/2019 11:00 AM Medical Record Number: [id number] Patient Account Number: [id number] Date of Birth/Sex: September 21, 1951 (68 y.o. F) Treating RN: Harold Barban Primary Care Provider: Caryl Bis, ERIC Other Clinician: Referring Provider: Caryl Bis, ERIC Treating Provider/Extender: Melburn Hake, HOYT Weeks in Treatment: 1 Information Obtained from: Patient Chief Complaint Scalp laceration Electronic Signature(s) Signed: 05/03/2019 11:25:56 PM By: Worthy Keeler PA-C Entered By: Worthy Keeler on 05/01/2019 11:12:04 Amy Mcconnell ([id number]) -------------------------------------------------------------------------------- HPI Details Patient Name: Amy Mcconnell Date of Service: 05/01/2019 11:00 AM Medical Record Number: [id number] Patient Account Number: [id number] Date of Birth/Sex: 1951/08/19 (68 y.o. F) Treating RN: Harold Barban Primary Care Provider: Caryl Bis, ERIC Other Clinician: Referring Provider: Caryl Bis, ERIC Treating Provider/Extender: Melburn Hake, HOYT Weeks in Treatment: 1 History of Present Illness HPI Description: 04/20/19 on evaluation today patient presents for initial evaluation our clinic concerning the laceration that she has extending from her left temple region across the upper portion of her forehead into the scalp into the posterior portion of her head. This occurred secondary to a fall which occurred on April 08, 2019. Subsequently she was seen in emergency department where this was cleaned and staple close that seems to have done very well for her. Fortunately there's no signs of active infection at this time. There does not appear to be signs of significant issue other than the fact that the blood collection which is around the left temple and Henrene Pastor orbital region has begun to leak out of a small opening in the  wound at the temple location which is caused a significant drainage unfortunately. It does appear that she is very close to the time when we need to move the sutures staples but I would like to actually get this likely just a few more days I will probably bring her back on Friday in order to undertake this. No fevers, chills, nausea, or vomiting noted at this time. 04/24/19 on evaluation today patient actually appears to be doing better in regard to her scalp wound in the region over the left for head and. Orbital area. She's not having any drainage like what she did when I saw her back on Monday fortunately this is doing much better. I do think the staples are ready to come out at this point. Overall there's no signs of active infection currently. 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 Nonetheless I'm not sure if this is going to be something that even is reversible or not she does want to know who she could see to try to see if anything could be done in that regard. 05/01/19 on evaluation today patient actually appears to be doing quite well in regard to the majority of her scalp laceration region. With that being said she had one area right on the left temple where she appeared to have almost what appeared to be a blood blister underneath. This was On the top. It was somewhat loose and while attempting to look at this to see how it was attached it actually broke loose and started bleeding as it was a very superficial artery at the site that caused issues. Subsequently we did have to manages to get everything stopped as far as the bleeding was concerned. Fortunately there's  no signs of active infection at this time. Electronic Signature(s) Signed: 05/03/2019 11:25:56 PM By: Worthy Keeler PA-C Entered By: Worthy Keeler on 05/01/2019  11:35:37 Amy Mcconnell ([id number]) -------------------------------------------------------------------------------- Physical Exam Details Patient Name: Amy Mcconnell Date of Service: 05/01/2019 11:00 AM Medical Record Number: [id number] Patient Account Number: [id number] Date of Birth/Sex: [birth year] (68 y.o. F) Treating RN: Harold Barban Primary Care Provider: Caryl Bis, ERIC Other Clinician: Referring Provider: Caryl Bis, ERIC Treating Provider/Extender: STONE III, HOYT Weeks in Treatment: 1 Constitutional Well-nourished and well-hydrated in no acute distress. Respiratory normal breathing without difficulty. clear to auscultation bilaterally. Cardiovascular regular rate and rhythm with normal S1, S2. Psychiatric this patient is able to make decisions and demonstrates good insight into disease process. Alert and Oriented x 3. pleasant and cooperative. Notes Again patient's scab which was very loose on the left temple region which is really the only area that still open or even given her any trouble whatsoever at this time when I was looking at it on the posterior side actually broke loose and she had a superficial artery at the site which was bleeding and we had some difficulty getting this control. We did end up using surgicel to help achieve hemostasis and hopefully keep this from causing any issues going forward as well. Pressure dressing was applied post application. The good news is hemostasis was achieved with pressure and the use of surgicel. No actual debridement was performed today. Electronic Signature(s) Signed: 05/03/2019 11:25:56 PM By: Worthy Keeler PA-C Entered By: Worthy Keeler on 05/01/2019 11:37:46 Amy Mcconnell ([id number]) -------------------------------------------------------------------------------- Physician Orders Details Patient Name: Amy Mcconnell Date of Service: 05/01/2019 11:00 AM Medical Record Number: [id number] Patient  Account Number: [id number] Date of Birth/Sex: [birth year] (68 y.o. F) Treating RN: Cornell Barman Primary Care Provider: Caryl Bis, ERIC Other Clinician: Referring Provider: Caryl Bis, ERIC Treating Provider/Extender: Melburn Hake, HOYT Weeks in Treatment: 1 Verbal / Phone Orders: No Diagnosis Coding ICD-10 Coding Code Description S01.80XA Unspecified open wound of other part of head, initial encounter I10 Essential (primary) hypertension E03.9 Hypothyroidism, unspecified F31.89 Other bipolar disorder Wound Cleansing Wound #1 Left Head - frontal o May Shower, gently pat wound dry prior to applying new dressing. - Please use baby shampoo when washing the area and please do not scratch Primary Wound Dressing Wound #1 Left Head - frontal o Other: - Surgicell to stop bleeding. Dressing Change Frequency Wound #1 Left Head - frontal o Other: - only if needed. Follow-up Appointments Wound #1 Left Head - frontal o Return Appointment in 1 week. o Other: - Monday Home Health Wound #1 Left Head - frontal o [place] Nurse may visit PRN to address patientos wound care needs. o FACE TO FACE ENCOUNTER: MEDICARE and MEDICAID PATIENTS: I certify that this patient is under my care and that I had a face-to-face encounter that meets the physician face-to-face encounter requirements with this patient on this date. The encounter with the patient was in whole or in part for the following MEDICAL CONDITION: (primary reason for [place]) MEDICAL NECESSITY: I certify, that based on my findings, NURSING services are a medically necessary home health service. HOME BOUND STATUS: I certify that my clinical findings support that this patient is homebound (i.e., Due to illness or injury, pt requires aid of supportive devices such as crutches, cane, wheelchairs, walkers, the use of special transportation or the assistance of another person to leave their place  of residence. There is a normal  inability to leave the home and doing so requires considerable and taxing effort. Other absences are for medical reasons / religious services and are infrequent or of short duration when for other reasons). o If current dressing causes regression in wound condition, may D/C ordered dressing product/s and apply Normal Saline Moist Dressing daily until next [place] / Other MD appointment. [place] of regression in wound condition at [phone]. Amy Mcconnell, Amy Mcconnell ([id number]) o Please direct any NON-WOUND related issues/requests for orders to patient's Primary Care Physician Electronic Signature(s) Signed: 05/01/2019 4:38:11 PM By: Gretta Cool, BSN, RN, CWS, Kim RN, BSN Signed: 05/03/2019 11:25:56 PM By: Worthy Keeler PA-C Entered By: Gretta Cool BSN, RN, CWS, Kim on 05/01/2019 11:37:48 Amy Mcconnell ([id number]) -------------------------------------------------------------------------------- Problem List Details Patient Name: Amy Mcconnell Date of Service: 05/01/2019 11:00 AM Medical Record Number: [id number] Patient Account Number: [id number] Date of Birth/Sex: [birth year] (68 y.o. F) Treating RN: Harold Barban Primary Care Provider: Caryl Bis, ERIC Other Clinician: Referring Provider: Caryl Bis, ERIC Treating Provider/Extender: Melburn Hake, HOYT Weeks in Treatment: 1 Active Problems ICD-10 Evaluated Encounter Code Description Active Date Today Diagnosis S01.80XA Unspecified open wound of other part of head, initial 04/20/2019 No Yes encounter [place] (primary) hypertension 04/20/2019 No Yes E03.9 Hypothyroidism, unspecified 04/20/2019 No Yes F31.89 Other bipolar disorder 04/20/2019 No Yes Inactive Problems Resolved Problems Electronic Signature(s) Signed: 05/03/2019 11:25:56 PM By: Worthy Keeler PA-C Entered By: Worthy Keeler on 05/01/2019 11:11:55 Amy Mcconnell  ([id number]) -------------------------------------------------------------------------------- Progress Note Details Patient Name: Amy Mcconnell Date of Service: 05/01/2019 11:00 AM Medical Record Number: [id number] Patient Account Number: [id number] Date of Birth/Sex: Mar 20, 1951 (68 y.o. F) Treating RN: Harold Barban Primary Care Provider: Caryl Bis, ERIC Other Clinician: Referring Provider: Caryl Bis, ERIC Treating Provider/Extender: Melburn Hake, HOYT Weeks in Treatment: 1 Subjective Chief Complaint Information obtained from Patient Scalp laceration History of Present Illness (HPI) 04/20/19 on evaluation today patient presents for initial evaluation our clinic concerning the laceration that she has extending from her left temple region across the upper portion of her forehead into the scalp into the posterior portion of her head. This occurred secondary to a fall which occurred on April 08, 2019. Subsequently she was seen in emergency department where this was cleaned and staple close that seems to have done very well for her. Fortunately there's no signs of active infection at this time. There does not appear to be signs of significant issue other than the fact that the blood collection which is around the left temple and Henrene Pastor orbital region has begun to leak out of a small opening in the wound at the temple location which is caused a significant drainage unfortunately. It does appear that she is very close to the time when we need to move the sutures staples but I would like to actually get this likely just a few more days I will probably bring her back on Friday in order to undertake this. No fevers, chills, nausea, or vomiting noted at this time. 04/24/19 on evaluation today patient actually appears to be doing better in regard to her scalp wound in the region over the left for head and. Orbital area. She's not having any drainage like what she did when I saw her back on Monday  fortunately this is doing much better. I do think the staples are ready to come out at this point. Overall there's no signs of active infection currently. She does seem to have some paralysis of the eyebrow in particular  noted on evaluation at this point and I think this is likely due to potentially some nerve injury with everything that was going on. Nonetheless I'm not sure if this is going to be something that even is reversible or not she does want to know who she could see to try to see if anything could be done in that regard. 05/01/19 on evaluation today patient actually appears to be doing quite well in regard to the majority of her scalp laceration region. With that being said she had one area right on the left temple where she appeared to have almost what appeared to be a blood blister underneath. This was On the top. It was somewhat loose and while attempting to look at this to see how it was attached it actually broke loose and started bleeding as it was a very superficial artery at the site that caused issues. Subsequently we did have to manages to get everything stopped as far as the bleeding was concerned. Fortunately there's no signs of active infection at this time. Patient History Information obtained from Patient. Family History Heart Disease - Siblings,Paternal Grandparents, Hypertension - Paternal Grandparents,Siblings, Thyroid Problems - Maternal Grandparents,Father,Siblings, No family history of Cancer, Diabetes, Kidney Disease, Lung Disease, Seizures, Stroke, Tuberculosis. Social History Former smoker - Quit 20+ years, Marital Status - Divorced, Alcohol Use - Never, Drug Use - No History, Caffeine Use - Daily. Medical History Cardiovascular Patient has history of Hypertension Denies history of Angina, Arrhythmia, Congestive Heart Failure, Coronary Artery Disease, Deep Vein Thrombosis, Amy Mcconnell, Amy Mcconnell ([id number]) Hypotension, Myocardial Infarction, Peripheral  Arterial Disease, Peripheral Venous Disease, Phlebitis, Vasculitis Endocrine Denies history of Type I Diabetes, Type II Diabetes Integumentary (Skin) Denies history of History of Burn, History of pressure wounds Medical And Surgical History Notes Psychiatric Bipolar Review of Systems (ROS) Constitutional Symptoms (General Health) Denies complaints or symptoms of Fatigue, Fever, Chills, Marked Weight Change. Respiratory Denies complaints or symptoms of Chronic or frequent coughs, Shortness of Breath. Cardiovascular Denies complaints or symptoms of Chest pain, LE edema. Psychiatric Denies complaints or symptoms of Anxiety, Claustrophobia. Objective Constitutional Well-nourished and well-hydrated in no acute distress. Vitals Time Taken: 11:06 AM, Height: 67 in, Weight: 142 lbs, BMI: 22.2, Temperature: 98.7 F, Pulse: 59 bpm, Respiratory Rate: 16 breaths/min, Blood Pressure: 103/64 mmHg. Respiratory normal breathing without difficulty. clear to auscultation bilaterally. Cardiovascular regular rate and rhythm with normal S1, S2. Psychiatric this patient is able to make decisions and demonstrates good insight into disease process. Alert and Oriented x 3. pleasant and cooperative. General Notes: Again patient's scab which was very loose on the left temple region which is really the only area that still open or even given her any trouble whatsoever at this time when I was looking at it on the posterior side actually broke loose and she had a superficial artery at the site which was bleeding and we had some difficulty getting this control. We did end up using surgicel to help achieve hemostasis and hopefully keep this from causing any issues going forward as well. Pressure dressing was applied post application. The good news is hemostasis was achieved with pressure and the use of surgicel. No actual debridement was performed today. Integumentary (Hair, Skin) Wound #1 status is Open.  Original cause of wound was Trauma. The wound is located on the Left Head - frontal. The wound measures 0.1cm length x 0.1cm width x 0.1cm depth; 0.008cm^2 area and 0.001cm^3 volume. There is Fat Layer (Subcutaneous Tissue) Exposed exposed. There is a  medium amount of serosanguineous drainage noted. The wound Amy Mcconnell, Amy Mcconnell. ([id number]) margin is well defined and not attached to the wound base. There is no granulation within the wound bed. There is a large (67-100%) amount of necrotic tissue within the wound bed. Assessment Active Problems ICD-10 Unspecified open wound of other part of head, initial encounter Essential (primary) hypertension Hypothyroidism, unspecified Other bipolar disorder Plan Wound Cleansing: Wound #1 Left Head - frontal: May Shower, gently pat wound dry prior to applying new dressing. - Please use baby shampoo when washing the area and please do not scratch Primary Wound Dressing: Wound #1 Left Head - frontal: Other: - Surgicell to stop bleeding. Dressing Change Frequency: Wound #1 Left Head - frontal: Other: - only if needed. Follow-up Appointments: Wound #1 Left Head - frontal: Return Appointment in 1 week. Other: - Monday Home Health: Wound #1 Left Head - frontal: [place] Nurse may visit PRN to address patient s wound care needs. FACE TO FACE ENCOUNTER: MEDICARE and MEDICAID PATIENTS: I certify that this patient is under my care and that I had a face-to-face encounter that meets the physician face-to-face encounter requirements with this patient on this date. The encounter with the patient was in whole or in part for the following MEDICAL CONDITION: (primary reason for [place]) MEDICAL NECESSITY: I certify, that based on my findings, NURSING services are a medically necessary home health service. HOME BOUND STATUS: I certify that my clinical findings support that this patient is homebound (i.e., Due to illness or  injury, pt requires aid of supportive devices such as crutches, cane, wheelchairs, walkers, the use of special transportation or the assistance of another person to leave their place of residence. There is a normal inability to leave the home and doing so requires considerable and taxing effort. Other absences are for medical reasons / religious services and are infrequent or of short duration when for other reasons). If current dressing causes regression in wound condition, may D/C ordered dressing product/s and apply Normal Saline Moist Dressing daily until next [place] / Other MD appointment. [place] of regression in wound condition at [phone]. Please direct any NON-WOUND related issues/requests for orders to patient's Primary Care Physician Amy Mcconnell, Amy Mcconnell ([id number]) My suggestion at this point is gonna be that we go ahead and bring the patient back on Monday in order to reevaluate and see how things are doing to ensure that she's not having any trouble or difficulties with the area. Otherwise we will leave the surgicel in place in order to hopefully keep this from reopening that again while the new skin grows. We will subsequently see her back for reevaluation following that point likely a week later. In the meantime between now and then she will use the pressure dressings over the area. Please see above for specific wound care orders. We will see patient for re-evaluation in 1 week(s) here in the clinic. If anything worsens or changes patient will contact our office for additional recommendations. Electronic Signature(s) Signed: 05/03/2019 11:25:56 PM By: Worthy Keeler PA-C Entered By: Worthy Keeler on 05/01/2019 11:39:00 Amy Mcconnell ([id number]) -------------------------------------------------------------------------------- ROS/PFSH Details Patient Name: Amy Mcconnell Date of Service: 05/01/2019 11:00 AM Medical Record Number:  [id number] Patient Account Number: [id number] Date of Birth/Sex: September 23, 1951 (68 y.o. F) Treating RN: Harold Barban Primary Care Provider: Caryl Bis, ERIC Other Clinician: Referring Provider: Caryl Bis, ERIC Treating Provider/Extender: STONE III, HOYT Weeks in Treatment: 1 Information Obtained  From Patient Constitutional Symptoms (General Health) Complaints and Symptoms: Negative for: Fatigue; Fever; Chills; Marked Weight Change Respiratory Complaints and Symptoms: Negative for: Chronic or frequent coughs; Shortness of Breath Cardiovascular Complaints and Symptoms: Negative for: Chest pain; LE edema Medical History: Positive for: Hypertension Negative for: Angina; Arrhythmia; Congestive Heart Failure; Coronary Artery Disease; Deep Vein Thrombosis; Hypotension; Myocardial Infarction; Peripheral Arterial Disease; Peripheral Venous Disease; Phlebitis; Vasculitis Psychiatric Complaints and Symptoms: Negative for: Anxiety; Claustrophobia Medical History: Past Medical History Notes: Bipolar Endocrine Medical History: Negative for: Type I Diabetes; Type II Diabetes Integumentary (Skin) Medical History: Negative for: History of Burn; History of pressure wounds Immunizations Pneumococcal Vaccine: Received Pneumococcal Vaccination: No Implantable Devices None Family and Social History Amy Mcconnell, Amy Mcconnell ([id number]) Cancer: No; Diabetes: No; Heart Disease: Yes - Siblings,Paternal Grandparents; Hypertension: Yes - Paternal Grandparents,Siblings; Kidney Disease: No; Lung Disease: No; Seizures: No; Stroke: No; Thyroid Problems: Yes - Maternal Grandparents,Father,Siblings; Tuberculosis: No; Former smoker - Quit 20+ years; Marital Status - Divorced; Alcohol Use: Never; Drug Use: No History; Caffeine Use: Daily Physician Affirmation I have reviewed and agree with the above information. Electronic Signature(s) Signed: 05/01/2019 4:12:29 PM By: Harold Barban Signed: 05/03/2019  11:25:56 PM By: Worthy Keeler PA-C Entered By: Worthy Keeler on 05/01/2019 11:35:53 Amy Mcconnell ([id number]) -------------------------------------------------------------------------------- SuperBill Details Patient Name: Amy Mcconnell Date of Service: 05/01/2019 Medical Record Number: [id number] Patient Account Number: [id number] Date of Birth/Sex: 1951/05/25 (68 y.o. F) Treating RN: Harold Barban Primary Care Provider: Caryl Bis, ERIC Other Clinician: Referring Provider: Caryl Bis, ERIC Treating Provider/Extender: Melburn Hake, HOYT Weeks in Treatment: 1 Diagnosis Coding ICD-10 Codes Code Description S01.80XA Unspecified open wound of other part of head, initial encounter I10 Essential (primary) hypertension E03.9 Hypothyroidism, unspecified F31.89 Other bipolar disorder Facility Procedures CPT4 Code: [id number] Description: 99213 - WOUND CARE VISIT-LEV 3 EST PT Modifier: Quantity: 1 Physician Procedures CPT4 Code: 6073710 Description: 62694 - WC PHYS LEVEL 4 - EST PT ICD-10 Diagnosis Description S01.80XA Unspecified open wound of other part of head, initial enc I10 Essential (primary) hypertension E03.9 Hypothyroidism, unspecified F31.89 Other bipolar disorder Modifier: ounter Quantity: 1 Electronic Signature(s) Signed: 05/03/2019 11:25:56 PM By: Worthy Keeler PA-C Entered By: Worthy Keeler on 05/01/2019 11:39:12

## 2019-05-04 ENCOUNTER — Other Ambulatory Visit: Payer: Self-pay

## 2019-05-04 ENCOUNTER — Encounter: Payer: Medicare HMO | Admitting: Physician Assistant

## 2019-05-04 DIAGNOSIS — Z8249 Family history of ischemic heart disease and other diseases of the circulatory system: Secondary | ICD-10-CM | POA: Diagnosis not present

## 2019-05-04 DIAGNOSIS — S0181XD Laceration without foreign body of other part of head, subsequent encounter: Secondary | ICD-10-CM | POA: Diagnosis not present

## 2019-05-04 DIAGNOSIS — Z87891 Personal history of nicotine dependence: Secondary | ICD-10-CM | POA: Diagnosis not present

## 2019-05-04 DIAGNOSIS — I252 Old myocardial infarction: Secondary | ICD-10-CM | POA: Diagnosis not present

## 2019-05-04 DIAGNOSIS — I10 Essential (primary) hypertension: Secondary | ICD-10-CM | POA: Diagnosis not present

## 2019-05-04 DIAGNOSIS — F319 Bipolar disorder, unspecified: Secondary | ICD-10-CM | POA: Diagnosis not present

## 2019-05-04 DIAGNOSIS — F3189 Other bipolar disorder: Secondary | ICD-10-CM | POA: Diagnosis not present

## 2019-05-04 DIAGNOSIS — R234 Changes in skin texture: Secondary | ICD-10-CM | POA: Diagnosis not present

## 2019-05-04 DIAGNOSIS — E039 Hypothyroidism, unspecified: Secondary | ICD-10-CM | POA: Diagnosis not present

## 2019-05-04 DIAGNOSIS — Z8349 Family history of other endocrine, nutritional and metabolic diseases: Secondary | ICD-10-CM | POA: Diagnosis not present

## 2019-05-04 DIAGNOSIS — Z4800 Encounter for change or removal of nonsurgical wound dressing: Secondary | ICD-10-CM | POA: Diagnosis not present

## 2019-05-04 DIAGNOSIS — S0101XA Laceration without foreign body of scalp, initial encounter: Secondary | ICD-10-CM | POA: Diagnosis not present

## 2019-05-04 DIAGNOSIS — W19XXXD Unspecified fall, subsequent encounter: Secondary | ICD-10-CM | POA: Diagnosis not present

## 2019-05-04 NOTE — Telephone Encounter (Signed)
Noted. Thank you for checking with them.

## 2019-05-04 NOTE — Telephone Encounter (Signed)
Spoke with [facility] Fred[facility] Munro) stated patient does not need another referral from PCP , Freda Munro believes patient is confused and thinks she needs on for each appointment says he will explain to patient when she comes into office no referral needed.

## 2019-05-05 ENCOUNTER — Telehealth: Payer: Self-pay

## 2019-05-05 NOTE — Telephone Encounter (Signed)
Copied from [place] [id number]. Topic: Quick Communication - See Telephone Encounter >> May 04, 2019  5:47 PM [place] wrote: CRM for notification. See Telephone encounter for: 05/04/19. AFTER HOURS  Please reach out to pt tomorrow. Had a falling injury on June the 3rd and as she gets better realizing her foot is bothering her and is having some neck pain. Please follow up with appt if possible.# [phone]

## 2019-05-05 NOTE — Telephone Encounter (Signed)
Called to triage patient.  No answer.  LMTCB.

## 2019-05-06 ENCOUNTER — Encounter: Payer: Self-pay | Admitting: Family Medicine

## 2019-05-06 ENCOUNTER — Other Ambulatory Visit: Payer: Self-pay

## 2019-05-06 ENCOUNTER — Ambulatory Visit (INDEPENDENT_AMBULATORY_CARE_PROVIDER_SITE_OTHER): Payer: Medicare HMO | Admitting: Family Medicine

## 2019-05-06 DIAGNOSIS — S0101XA Laceration without foreign body of scalp, initial encounter: Secondary | ICD-10-CM

## 2019-05-06 DIAGNOSIS — M542 Cervicalgia: Secondary | ICD-10-CM

## 2019-05-06 DIAGNOSIS — M79672 Pain in left foot: Secondary | ICD-10-CM | POA: Diagnosis not present

## 2019-05-06 NOTE — Progress Notes (Signed)
Amy Mcconnell ([id number]) Visit Report for 05/04/2019 Chief Complaint Document Details Patient Name: Amy Mcconnell, Amy Mcconnell Date of Service: 05/04/2019 3:00 PM Medical Record Number: [id number] Patient Account Number: [id number] Date of Birth/Sex: 11-08-1950 (68 y.o. F) Treating RN: Harold Barban Primary Care Provider: Caryl Bis, ERIC Other Clinician: Referring Provider: Caryl Bis, ERIC Treating Provider/Extender: Melburn Hake, Lynnsie Linders Weeks in Treatment: 2 Information Obtained from: Patient Chief Complaint Scalp laceration Electronic Signature(s) Signed: 05/06/2019 12:14:37 AM By: Worthy Keeler PA-C Entered By: Worthy Keeler on 05/04/2019 15:03:59 Amy Mcconnell ([id number]) -------------------------------------------------------------------------------- HPI Details Patient Name: Amy Mcconnell Date of Service: 05/04/2019 3:00 PM Medical Record Number: [id number] Patient Account Number: [id number] Date of Birth/Sex: 1951/04/18 (68 y.o. F) Treating RN: Harold Barban Primary Care Provider: Caryl Bis, ERIC Other Clinician: Referring Provider: Caryl Bis, ERIC Treating Provider/Extender: Melburn Hake, Jadi Deyarmin Weeks in Treatment: 2 History of Present Illness HPI Description: 04/20/19 on evaluation today patient presents for initial evaluation our clinic concerning the laceration that she has extending from her left temple region across the upper portion of her forehead into the scalp into the posterior portion of her head. This occurred secondary to a fall which occurred on April 08, 2019. Subsequently she was seen in emergency department where this was cleaned and staple close that seems to have done very well for her. Fortunately there's no signs of active infection at this time. There does not appear to be signs of significant issue other than the fact that the blood collection which is around the left temple and Henrene Pastor orbital region has begun to leak out of a small opening in the  wound at the temple location which is caused a significant drainage unfortunately. It does appear that she is very close to the time when we need to move the sutures staples but I would like to actually get this likely just a few more days I will probably bring her back on Friday in order to undertake this. No fevers, chills, nausea, or vomiting noted at this time. 04/24/19 on evaluation today patient actually appears to be doing better in regard to her scalp wound in the region over the left for head and. Orbital area. She's not having any drainage like what she did when I saw her back on Monday fortunately this is doing much better. I do think the staples are ready to come out at this point. Overall there's no signs of active infection currently. 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 Nonetheless I'm not sure if this is going to be something that even is reversible or not she does want to know who she could see to try to see if anything could be done in that regard. 05/01/19 on evaluation today patient actually appears to be doing quite well in regard to the majority of her scalp laceration region. With that being said she had one area right on the left temple where she appeared to have almost what appeared to be a blood blister underneath. This was On the top. It was somewhat loose and while attempting to look at this to see how it was attached it actually broke loose and started bleeding as it was a very superficial artery at the site that caused issues. Subsequently we did have to manages to get everything stopped as far as the bleeding was concerned. Fortunately there's  no signs of active infection at this time. 05/04/19 patient presents today for follow-up evaluation after I saw her at the end of last week. She had a blood blister over  the last portion of the scalp wound at the temple on the left that had not completely healed. In fact this calls quite a bit of bleeding when it inadvertently popped off as well. Fortunately that all seems to be doing much better today. She is doing quite well and overall there is no bleeding and no evidence that this is threatening to do so. Electronic Signature(s) Signed: 05/06/2019 12:14:37 AM By: Worthy Keeler PA-C Entered By: Worthy Keeler on 05/05/2019 05:01:30 Amy Mcconnell ([id number]) -------------------------------------------------------------------------------- Physical Exam Details Patient Name: Amy Mcconnell Date of Service: 05/04/2019 3:00 PM Medical Record Number: [id number] Patient Account Number: [id number] Date of Birth/Sex: 1951-09-01 (68 y.o. F) Treating RN: Harold Barban Primary Care Provider: Caryl Bis, ERIC Other Clinician: Referring Provider: Caryl Bis, ERIC Treating Provider/Extender: STONE III, Marwa Fuhrman Weeks in Treatment: 2 Constitutional Well-nourished and well-hydrated in no acute distress. Respiratory normal breathing without difficulty. clear to auscultation bilaterally. Cardiovascular regular rate and rhythm with normal S1, S2. Psychiatric this patient is able to make decisions and demonstrates good insight into disease process. Alert and Oriented x 3. pleasant and cooperative. Notes Patient's wound bed really just appears to be for the most part he scabbed with the surgicel in. It really does not need to beplace in the dressing applied to this based on what I'm seeing at this point which is good news. The pressure dressing that we did last week seems to have done his job. Electronic Signature(s) Signed: 05/06/2019 12:14:37 AM By: Worthy Keeler PA-C Entered By: Worthy Keeler on 05/05/2019 05:02:22 Amy Mcconnell ([id number]) -------------------------------------------------------------------------------- Physician Orders  Details Patient Name: Amy Mcconnell Date of Service: 05/04/2019 3:00 PM Medical Record Number: [id number] Patient Account Number: [id number] Date of Birth/Sex: [birth year] (68 y.o. F) Treating RN: Harold Barban Primary Care Provider: Caryl Bis, ERIC Other Clinician: Referring Provider: Caryl Bis, ERIC Treating Provider/Extender: Melburn Hake, Aylla Huffine Weeks in Treatment: 2 Verbal / Phone Orders: No Diagnosis Coding ICD-10 Coding Code Description S01.80XA Unspecified open wound of other part of head, initial encounter I10 Essential (primary) hypertension E03.9 Hypothyroidism, unspecified F31.89 Other bipolar disorder Wound Cleansing Wound #1 Left Head - frontal o May Shower, gently pat wound dry prior to applying new dressing. - Please use baby shampoo when washing the area and please do not scratch Primary Wound Dressing Wound #1 Left Head - frontal o Other: - Leave open to air, wash carefully Follow-up Appointments Wound #1 Left Head - frontal o Return Appointment in 2 weeks. o Other: - Monday Home Health Wound #1 Left Head - frontal o D/C Home Health Services Electronic Signature(s) Signed: 05/04/2019 4:40:23 PM By: Harold Barban Signed: 05/06/2019 12:14:37 AM By: Worthy Keeler PA-C Entered By: Harold Barban on 05/04/2019 15:42:12 Amy Mcconnell ([id number]) -------------------------------------------------------------------------------- Problem List Details Patient Name: Amy Mcconnell Date of Service: 05/04/2019 3:00 PM Medical Record Number: [id number] Patient Account Number: [id number] Date of Birth/Sex: 1951-06-19 (68 y.o. F) Treating RN: Harold Barban Primary Care Provider: Caryl Bis, ERIC Other Clinician: Referring Provider: Caryl Bis, ERIC Treating Provider/Extender: Melburn Hake, Efrat Zuidema Weeks in Treatment: 2 Active Problems ICD-10 Evaluated Encounter Code Description Active Date Today Diagnosis S01.80XA Unspecified open wound of other  part of head, initial 04/20/2019 No Yes encounter I10 Essential (primary) hypertension 04/20/2019 No Yes E03.9 Hypothyroidism, unspecified 04/20/2019 No Yes F31.89 Other bipolar  disorder 04/20/2019 No Yes Inactive Problems Resolved Problems Electronic Signature(s) Signed: 05/06/2019 12:14:37 AM By: Worthy Keeler PA-C Entered By: Worthy Keeler on 05/04/2019 15:03:52 Amy Mcconnell ([id number]) -------------------------------------------------------------------------------- Progress Note Details Patient Name: Amy Mcconnell Date of Service: 05/04/2019 3:00 PM Medical Record Number: [id number] Patient Account Number: [id number] Date of Birth/Sex: 1951/02/15 (68 y.o. F) Treating RN: Harold Barban Primary Care Provider: Caryl Bis, ERIC Other Clinician: Referring Provider: Caryl Bis, ERIC Treating Provider/Extender: Melburn Hake, Noha Milberger Weeks in Treatment: 2 Subjective Chief Complaint Information obtained from Patient Scalp laceration History of Present Illness (HPI) 04/20/19 on evaluation today patient presents for initial evaluation our clinic concerning the laceration that she has extending from her left temple region across the upper portion of her forehead into the scalp into the posterior portion of her head. This occurred secondary to a fall which occurred on April 08, 2019. Subsequently she was seen in emergency department where this was cleaned and staple close that seems to have done very well for her. Fortunately there's no signs of active infection at this time. There does not appear to be signs of significant issue other than the fact that the blood collection which is around the left temple and Henrene Pastor orbital region has begun to leak out of a small opening in the wound at the temple location which is caused a significant drainage unfortunately. It does appear that she is very close to the time when we need to move the sutures staples but I would like to actually get this  likely just a few more days I will probably bring her back on Friday in order to undertake this. No fevers, chills, nausea, or vomiting noted at this time. 04/24/19 on evaluation today patient actually appears to be doing better in regard to her scalp wound in the region over the left for head and. Orbital area. She's not having any drainage like what she did when I saw her back on Monday fortunately this is doing much better. I do think the staples are ready to come out at this point. Overall there's no signs of active infection currently. 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 Nonetheless I'm not sure if this is going to be something that even is reversible or not she does want to know who she could see to try to see if anything could be done in that regard. 05/01/19 on evaluation today patient actually appears to be doing quite well in regard to the majority of her scalp laceration region. With that being said she had one area right on the left temple where she appeared to have almost what appeared to be a blood blister underneath. This was On the top. It was somewhat loose and while attempting to look at this to see how it was attached it actually broke loose and started bleeding as it was a very superficial artery at the site that caused issues. Subsequently we did have to manages to get everything stopped as far as the bleeding was concerned. Fortunately there's no signs of active infection at this time. 05/04/19 patient presents today for follow-up evaluation after I saw her at the end of last week. She had a blood blister over the last portion of the scalp wound at the temple on the left that had not completely healed. In fact this calls  quite a bit of bleeding when it inadvertently popped off as well. Fortunately that all seems to be  doing much better today. She is doing quite well and overall there is no bleeding and no evidence that this is threatening to do so. Patient History Information obtained from Patient. Family History Heart Disease - Siblings,Paternal Grandparents, Hypertension - Paternal Grandparents,Siblings, Thyroid Problems - Maternal Grandparents,Father,Siblings, No family history of Cancer, Diabetes, Kidney Disease, Lung Disease, Seizures, Stroke, Tuberculosis. Social History Former smoker - Quit 20+ years, Marital Status - Divorced, Alcohol Use - Never, Drug Use - No History, Caffeine Use - Daily. Amy Mcconnell, Amy Mcconnell ([id number]) Medical History Cardiovascular Patient has history of Hypertension Denies history of Angina, Arrhythmia, Congestive Heart Failure, Coronary Artery Disease, Deep Vein Thrombosis, Hypotension, Myocardial Infarction, Peripheral Arterial Disease, Peripheral Venous Disease, Phlebitis, Vasculitis Endocrine Denies history of Type I Diabetes, Type II Diabetes Integumentary (Skin) Denies history of History of Burn, History of pressure wounds Medical And Surgical History Notes Psychiatric Bipolar Review of Systems (ROS) Constitutional Symptoms (General Health) Denies complaints or symptoms of Fatigue, Fever, Chills, Marked Weight Change. Respiratory Denies complaints or symptoms of Chronic or frequent coughs, Shortness of Breath. Cardiovascular Denies complaints or symptoms of Chest pain, LE edema. Psychiatric Denies complaints or symptoms of Anxiety, Claustrophobia. Objective Constitutional Well-nourished and well-hydrated in no acute distress. Vitals Time Taken: 3:12 PM, Height: 67 in, Weight: 142 lbs, BMI: 22.2, Temperature: 99.2 F, Pulse: 75 bpm, Respiratory Rate: 16 breaths/min, Blood Pressure: 103/67 mmHg. Respiratory normal breathing without difficulty. clear to auscultation bilaterally. Cardiovascular regular rate and rhythm with normal S1, S2. Psychiatric this  patient is able to make decisions and demonstrates good insight into disease process. Alert and Oriented x 3. pleasant and cooperative. General Notes: Patient's wound bed really just appears to be for the most part he scabbed with the surgicel in. It really does not need to beplace in the dressing applied to this based on what I'm seeing at this point which is good news. The pressure dressing that we did last week seems to have done his job. Integumentary (Hair, Skin) Wound #1 status is Open. Original cause of wound was Trauma. The wound is located on the Left Head - frontal. The wound Amy Mcconnell, Amy Mcconnell. ([id number]) measures 0.1cm length x 0.1cm width x 0.1cm depth; 0.008cm^2 area and 0.001cm^3 volume. There is Fat Layer (Subcutaneous Tissue) Exposed exposed. There is no tunneling or undermining noted. There is a medium amount of serosanguineous drainage noted. The wound margin is well defined and not attached to the wound base. There is small (1-33%) pink granulation within the wound bed. There is a large (67-100%) amount of necrotic tissue within the wound bed. Assessment Active Problems ICD-10 Unspecified open wound of other part of head, initial encounter Essential (primary) hypertension Hypothyroidism, unspecified Other bipolar disorder Plan Wound Cleansing: Wound #1 Left Head - frontal: May Shower, gently pat wound dry prior to applying new dressing. - Please use baby shampoo when washing the area and please do not scratch Primary Wound Dressing: Wound #1 Left Head - frontal: Other: - Leave open to air, wash carefully Follow-up Appointments: Wound #1 Left Head - frontal: Return Appointment in 2 weeks. Other: - Monday Home Health: Wound #1 Left Head - frontal: D/C Home Health Services My suggestion at this point is gonna be that we go ahead and continue with the above wound to measures for the next week. Really we're not gonna do anything to this area she doesn't really need  to put the ointment on it and subsequently I think that mainly just leaving this open air is the best thing to do at this point. Eventually the scab with the will pop off but will let that happen naturally on its own.surgicel I'll plan to see her back for reevaluation two weeks just to see were things stand. Please see above for specific wound care orders. We will see patient for re-evaluation in 2 week(s) here in the clinic. If anything worsens or changes patient will contact our office for additional recommendations. Electronic Signature(s) Signed: 05/06/2019 12:14:37 AM By: Worthy Keeler PA-C Entered By: Worthy Keeler on 05/05/2019 05:02:56 Amy Mcconnell ([id number]) -------------------------------------------------------------------------------- ROS/PFSH Details Patient Name: Amy Mcconnell Date of Service: 05/04/2019 3:00 PM Medical Record Number: [id number] Patient Account Number: [id number] Date of Birth/Sex: Jun 26, 1951 (68 y.o. F) Treating RN: Harold Barban Primary Care Provider: Caryl Bis, ERIC Other Clinician: Referring Provider: Caryl Bis, ERIC Treating Provider/Extender: Melburn Hake, Ariela Mochizuki Weeks in Treatment: 2 Information Obtained From Patient Constitutional Symptoms (General Health) Complaints and Symptoms: Negative for: Fatigue; Fever; Chills; Marked Weight Change Respiratory Complaints and Symptoms: Negative for: Chronic or frequent coughs; Shortness of Breath Cardiovascular Complaints and Symptoms: Negative for: Chest pain; LE edema Medical History: Positive for: Hypertension Negative for: Angina; Arrhythmia; Congestive Heart Failure; Coronary Artery Disease; Deep Vein Thrombosis; Hypotension; Myocardial Infarction; Peripheral Arterial Disease; Peripheral Venous Disease; Phlebitis; Vasculitis Psychiatric Complaints and Symptoms: Negative for: Anxiety; Claustrophobia Medical History: Past Medical History Notes: Bipolar Endocrine Medical  History: Negative for: Type I Diabetes; Type II Diabetes Integumentary (Skin) Medical History: Negative for: History of Burn; History of pressure wounds Immunizations Pneumococcal Vaccine: Received Pneumococcal Vaccination: No Implantable Devices None Family and Social History Amy Mcconnell, Amy Mcconnell ([id number]) Cancer: No; Diabetes: No; Heart Disease: Yes - Siblings,Paternal Grandparents; Hypertension: Yes - Paternal Grandparents,Siblings; Kidney Disease: No; Lung Disease: No; Seizures: No; Stroke: No; Thyroid Problems: Yes - Maternal Grandparents,Father,Siblings; Tuberculosis: No; Former smoker - Quit 20+ years; Marital Status - Divorced; Alcohol Use: Never; Drug Use: No History; Caffeine Use: Daily Physician Affirmation I have reviewed and agree with the above information. Electronic Signature(s) Signed: 05/05/2019 3:07:20 PM By: Harold Barban Signed: 05/06/2019 12:14:37 AM By: Worthy Keeler PA-C Entered By: Worthy Keeler on 05/05/2019 05:02:06 Amy Mcconnell ([id number]) -------------------------------------------------------------------------------- SuperBill Details Patient Name: Amy Mcconnell Date of Service: 05/04/2019 Medical Record Number: [id number] Patient Account Number: [id number] Date of Birth/Sex: 08/26/1951 (68 y.o. F) Treating RN: Harold Barban Primary Care Provider: Caryl Bis, ERIC Other Clinician: Referring Provider: Caryl Bis, ERIC Treating Provider/Extender: Melburn Hake, Adar Rase Weeks in Treatment: 2 Diagnosis Coding ICD-10 Codes Code Description S01.80XA Unspecified open wound of other part of head, initial encounter I10 Essential (primary) hypertension E03.9 Hypothyroidism, unspecified F31.89 Other bipolar disorder Facility Procedures CPT4 Code: [id number] Description: 94765 - WOUND CARE VISIT-LEV 2 EST PT Modifier: Quantity: 1 Physician Procedures CPT4 Code: [id number] Description: 65681 - WC PHYS LEVEL 4 - EST PT ICD-10 Diagnosis Description  S01.80XA Unspecified open wound of other part of head, initial enc I10 Essential (primary) hypertension E03.9 Hypothyroidism, unspecified F31.89 Other bipolar disorder Modifier: ounter Quantity: 1 Electronic Signature(s) Signed: 05/06/2019 12:14:37 AM By: Worthy Keeler PA-C Entered By: Worthy Keeler on 05/05/2019 05:03:09

## 2019-05-06 NOTE — Assessment & Plan Note (Signed)
Seems to be healing quite well with this.  She will continue to follow with wound care.
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
## 2019-05-06 NOTE — Progress Notes (Signed)
NYELA, CORTINAS ([id number]) Visit Report for 05/04/2019 Arrival Information Details Patient Name: Amy Mcconnell, Amy Mcconnell Date of Service: 05/04/2019 3:00 PM Medical Record Number: [id number] Patient Account Number: [id number] Date of Birth/Sex: 06/24/1951 (68 y.o. F) Treating RN: Army Melia Primary Care Alazay Leicht: Caryl Bis, ERIC Other Clinician: Referring Kamarri Lovvorn: Caryl Bis, ERIC Treating Sequoyah Counterman/Extender: Melburn Hake, HOYT Weeks in Treatment: 2 Visit Information History Since Last Visit Added or deleted any medications: No Patient Arrived: Ambulatory Any new allergies or adverse reactions: No Arrival Time: 15:12 Had a fall or experienced change in No Accompanied By: self activities of daily living that may affect Transfer Assistance: None risk of falls: Signs or symptoms of abuse/neglect since last visito No Hospitalized since last visit: No Has Dressing in Place as Prescribed: Yes Pain Present Now: No Electronic Signature(s) Signed: 05/04/2019 3:40:05 PM By: Army Melia Entered By: Army Melia on 05/04/2019 15:12:25 [name] ([id number]) -------------------------------------------------------------------------------- Clinic Level of Care Assessment Details Patient Name: [name] Date of Service: 05/04/2019 3:00 PM Medical Record Number: [id number] Patient Account Number: [id number] Date of Birth/Sex: [birth year] (68 y.o. F) Treating RN: Harold Barban Primary Care Callista Hoh: Caryl Bis, ERIC Other Clinician: Referring Vicci Reder: Caryl Bis, ERIC Treating Rylynne Schicker/Extender: Melburn Hake, HOYT Weeks in Treatment: 2 Clinic Level of Care Assessment Items TOOL 4 Quantity Score []  - Use when only an EandM is performed on FOLLOW-UP visit 0 ASSESSMENTS - Nursing Assessment / Reassessment X - Reassessment of Co-morbidities (includes updates in patient status) 1 10 X- 1 5 Reassessment of Adherence to Treatment Plan ASSESSMENTS - Wound and Skin Assessment /  Reassessment X - Simple Wound Assessment / Reassessment - one wound 1 5 []  - 0 Complex Wound Assessment / Reassessment - multiple wounds []  - 0 Dermatologic / Skin Assessment (not related to wound area) ASSESSMENTS - Focused Assessment []  - Circumferential Edema Measurements - multi extremities 0 []  - 0 Nutritional Assessment / Counseling / Intervention []  - 0 Lower Extremity Assessment (monofilament, tuning fork, pulses) []  - 0 Peripheral Arterial Disease Assessment (using hand held doppler) ASSESSMENTS - Ostomy and/or Continence Assessment and Care []  - Incontinence Assessment and Management 0 []  - 0 Ostomy Care Assessment and Management (repouching, etc.) PROCESS - Coordination of Care X - Simple Patient / Family Education for ongoing care 1 15 []  - 0 Complex (extensive) Patient / Family Education for ongoing care []  - 0 Staff obtains Programmer, systems, Records, Test Results / Process Orders []  - 0 Staff telephones HHA, Nursing Homes / Clarify orders / etc []  - 0 Routine Transfer to another Facility (non-emergent condition) []  - 0 Routine Hospital Admission (non-emergent condition) []  - 0 New Admissions / Biomedical engineer / Ordering NPWT, Apligraf, etc. []  - 0 Emergency Hospital Admission (emergent condition) X- 1 10 Simple Discharge Coordination BRAYLEI, TOTINO ([id number]) []  - 0 Complex (extensive) Discharge Coordination PROCESS - Special Needs []  - Pediatric / Minor Patient Management 0 []  - 0 Isolation Patient Management []  - 0 Hearing / Language / Visual special needs []  - 0 Assessment of Community assistance (transportation, D/C planning, etc.) []  - 0 Additional assistance / Altered mentation []  - 0 Support Surface(s) Assessment (bed, cushion, seat, etc.) INTERVENTIONS - Wound Cleansing / Measurement []  - Simple Wound Cleansing - one wound 0 []  - 0 Complex Wound Cleansing - multiple wounds X- 1 5 Wound Imaging (photographs - any number of wounds) []   - 0 Wound Tracing (instead of photographs) X- 1 5 Simple Wound Measurement - one wound []  - 0 Complex Wound Measurement -  multiple wounds INTERVENTIONS - Wound Dressings X - Small Wound Dressing one or multiple wounds 1 10 []  - 0 Medium Wound Dressing one or multiple wounds []  - 0 Large Wound Dressing one or multiple wounds []  - 0 Application of Medications - topical []  - 0 Application of Medications - injection INTERVENTIONS - Miscellaneous []  - External ear exam 0 []  - 0 Specimen Collection (cultures, biopsies, blood, body fluids, etc.) []  - 0 Specimen(s) / Culture(s) sent or taken to Lab for analysis []  - 0 Patient Transfer (multiple staff / Harrel Lemon Lift / Similar devices) []  - 0 Simple Staple / Suture removal (25 or less) []  - 0 Complex Staple / Suture removal (26 or more) []  - 0 Hypo / Hyperglycemic Management (close monitor of Blood Glucose) []  - 0 Ankle / Brachial Index (ABI) - do not check if billed separately X- 1 5 Vital Signs JULIE-ANN, VANMAANEN. ([id number]) Has the patient been seen at the hospital within the last three years: Yes Total Score: 70 Level Of Care: New/Established - Level 2 Electronic Signature(s) Signed: 05/04/2019 4:40:23 PM By: Harold Barban Entered By: Harold Barban on 05/04/2019 15:43:27 [name] ([id number]) -------------------------------------------------------------------------------- Encounter Discharge Information Details Patient Name: [name] Date of Service: 05/04/2019 3:00 PM Medical Record Number: [id number] Patient Account Number: [id number] Date of Birth/Sex: Sep 11, 1951 (68 y.o. F) Treating RN: Harold Barban Primary Care Lasharn Bufkin: Caryl Bis, ERIC Other Clinician: Referring Reilley Latorre: Caryl Bis, ERIC Treating Aurora Rody/Extender: Sharalyn Ink in Treatment: 2 Encounter Discharge Information Items Discharge Condition: Stable Ambulatory Status: Ambulatory Discharge Destination:  Home Transportation: Private Auto Accompanied By: self Schedule Follow-up Appointment: Yes Clinical Summary of Care: Electronic Signature(s) Signed: 05/04/2019 4:40:23 PM By: Harold Barban Entered By: Harold Barban on 05/04/2019 15:44:38 [name] ([id number]) -------------------------------------------------------------------------------- Lower Extremity Assessment Details Patient Name: [name] Date of Service: 05/04/2019 3:00 PM Medical Record Number: [id number] Patient Account Number: [id number] Date of Birth/Sex: [birth year] (68 y.o. F) Treating RN: Army Melia Primary Care Amarius Toto: Caryl Bis ERIC Other Clinician: Referring Kenyatta Gloeckner: Caryl Bis, ERIC Treating Walter Grima/Extender: Melburn Hake, HOYT Weeks in Treatment: 2 Electronic Signature(s) Signed: 05/04/2019 3:40:05 PM By: Army Melia Entered By: Army Melia on 05/04/2019 15:15:49 [name] ([id number]) -------------------------------------------------------------------------------- Multi Wound Chart Details Patient Name: [name] Date of Service: 05/04/2019 3:00 PM Medical Record Number: [id number] Patient Account Number: [id number] Date of Birth/Sex: [birth year] (68 y.o. F) Treating RN: Harold Barban Primary Care Davion Meara: Caryl Bis, ERIC Other Clinician: Referring Najia Hurlbutt: Caryl Bis, ERIC Treating Jahel Wavra/Extender: Melburn Hake, HOYT Weeks in Treatment: 2 Vital Signs Height(in): 66 Pulse(bpm): 75 Weight(lbs): 142 Blood Pressure(mmHg): 103/67 Body Mass Index(BMI): 22 Temperature(F): 99.2 Respiratory Rate 16 (breaths/min): Photos: [N/A:N/A] Wound Location: Left Head - frontal N/A N/A Wounding Event: Trauma N/A N/A Primary Etiology: Trauma, Other N/A N/A Comorbid History: Hypertension N/A N/A Date Acquired: 04/08/2019 N/A N/A Weeks of Treatment: 2 N/A N/A Wound Status: Open N/A N/A Measurements L x W x D 0.1x0.1x0.1 N/A N/A (cm) Area (cm) : 0.008 N/A N/A Volume  (cm) : 0.001 N/A N/A % Reduction in Area: 50.00% N/A N/A % Reduction in Volume: 83.30% N/A N/A Classification: Full Thickness Without N/A N/A Exposed Support Structures Exudate Amount: Medium N/A N/A Exudate Type: Serosanguineous N/A N/A Exudate Color: red, brown N/A N/A Wound Margin: Well defined, not attached N/A N/A Granulation Amount: Small (1-33%) N/A N/A Granulation Quality: Pink N/A N/A Necrotic Amount: Large (67-100%) N/A N/A Exposed Structures: Fat Layer (Subcutaneous N/A N/A Tissue) Exposed: Yes Fascia: No Tendon: No Muscle: No  Joint: No Bone: No BETH, GOODLIN ([id number]) Epithelialization: None N/A N/A Treatment Notes Electronic Signature(s) Signed: 05/04/2019 4:40:23 PM By: Harold Barban Entered By: Harold Barban on 05/04/2019 15:38:34 [name] ([id number]) -------------------------------------------------------------------------------- Multi-Disciplinary Care Plan Details Patient Name: [name] Date of Service: 05/04/2019 3:00 PM Medical Record Number: [id number] Patient Account Number: [id number] Date of Birth/Sex: August 07, 1951 (68 y.o. F) Treating RN: Harold Barban Primary Care Dedee Liss: Caryl Bis, ERIC Other Clinician: Referring Mariaeduarda Defranco: Caryl Bis, ERIC Treating Hrithik Boschee/Extender: Melburn Hake, HOYT Weeks in Treatment: 2 Active Inactive Abuse / Safety / Falls / Self Care Management Nursing Diagnoses: History of Falls Goals: Patient will not experience any injury related to falls Date Initiated: 04/24/2019 Target Resolution Date: 07/11/2019 Goal Status: Active Interventions: Assess fall risk on admission and as needed Notes: Orientation to the Wound Care Program Nursing Diagnoses: Knowledge deficit related to the wound healing center program Goals: Patient/caregiver will verbalize understanding of the [facility] Date Initiated: 04/24/2019 Target Resolution Date: 07/11/2019 Goal Status:  Active Interventions: Provide education on orientation to the wound center Notes: Pain, Acute or Chronic Nursing Diagnoses: Pain, acute or chronic: actual or potential Goals: Patient/caregiver will verbalize comfort level met Date Initiated: 04/24/2019 Target Resolution Date: 07/11/2019 Goal Status: Active Interventions: Assess comfort goal upon admission DEASIAH, HAGBERG ([id number]) Notes: Wound/Skin Impairment Nursing Diagnoses: Impaired tissue integrity Goals: Ulcer/skin breakdown will have a volume reduction of 30% by week 4 Date Initiated: 04/20/2019 Target Resolution Date: 05/20/2019 Goal Status: Active Interventions: Assess patient/caregiver ability to obtain necessary supplies Assess patient/caregiver ability to perform ulcer/skin care regimen upon admission and as needed Assess ulceration(s) every visit Provide education on ulcer and skin care Notes: Electronic Signature(s) Signed: 05/04/2019 4:40:23 PM By: Harold Barban Entered By: Harold Barban on 05/04/2019 15:38:25 [name] ([id number]) -------------------------------------------------------------------------------- Pain Assessment Details Patient Name: [name] Date of Service: 05/04/2019 3:00 PM Medical Record Number: [id number] Patient Account Number: [id number] Date of Birth/Sex: 1951-09-14 (68 y.o. F) Treating RN: Army Melia Primary Care Willim Turnage: Caryl Bis, ERIC Other Clinician: Referring Amadea Keagy: Caryl Bis, ERIC Treating Guerline Happ/Extender: Melburn Hake, HOYT Weeks in Treatment: 2 Active Problems Location of Pain Severity and Description of Pain Patient Has Paino No Site Locations Pain Management and Medication Current Pain Management: Electronic Signature(s) Signed: 05/04/2019 3:40:05 PM By: Army Melia Entered By: Army Melia on 05/04/2019 15:12:31 [name]  ([id number]) -------------------------------------------------------------------------------- Patient/Caregiver Education Details Patient Name: [name] Date of Service: 05/04/2019 3:00 PM Medical Record Number: [id number] Patient Account Number: [id number] Date of Birth/Gender: [birth year] (68 y.o. F) Treating RN: Harold Barban Primary Care Physician: Caryl Bis, ERIC Other Clinician: Referring Physician: Caryl Bis, ERIC Treating Physician/Extender: Sharalyn Ink in Treatment: 2 Education Assessment Education Provided To: Patient Education Topics Provided Wound/Skin Impairment: Handouts: Caring for Your Ulcer Methods: Demonstration, Explain/Verbal Responses: State content correctly Electronic Signature(s) Signed: 05/04/2019 4:40:23 PM By: Harold Barban Entered By: Harold Barban on 05/04/2019 15:38:50 [name] ([id number]) -------------------------------------------------------------------------------- Wound Assessment Details Patient Name: [name] Date of Service: 05/04/2019 3:00 PM Medical Record Number: [id number] Patient Account Number: [id number] Date of Birth/Sex: 1951-09-03 (68 y.o. F) Treating RN: Army Melia Primary Care Keely Drennan: Caryl Bis, ERIC Other Clinician: Referring Shai Rasmussen: Caryl Bis, ERIC Treating Maryella Abood/Extender: STONE III, HOYT Weeks in Treatment: 2 Wound Status Wound Number: 1 Primary Etiology: Trauma, Other Wound Location: Left Head - frontal Wound Status: Open Wounding Event: Trauma Comorbid History: Hypertension Date Acquired: 04/08/2019 Weeks Of Treatment: 2 Clustered Wound: No Photos Wound Measurements Length: (cm) 0.1 Width: (cm) 0.1  Depth: (cm) 0.1 Area: (cm) 0.008 Volume: (cm) 0.001 % Reduction in Area: 50% % Reduction in Volume: 83.3% Epithelialization: None Tunneling: No Undermining: No Wound Description Full Thickness Without Exposed Support Foul Odo Classification: Structures  Slough/F Wound Margin: Well defined, not attached Exudate Medium Amount: Exudate Type: Serosanguineous Exudate Color: red, brown r After Cleansing: No ibrino No Wound Bed Granulation Amount: Small (1-33%) Exposed Structure Granulation Quality: Pink Fascia Exposed: No Necrotic Amount: Large (67-100%) Fat Layer (Subcutaneous Tissue) Exposed: Yes Tendon Exposed: No Muscle Exposed: No Joint Exposed: No Bone Exposed: No JENNIFFER, VESSELS ([id number]) Treatment Notes Wound #1 (Left Head - frontal) Notes Leave open to air Electronic Signature(s) Signed: 05/04/2019 3:40:05 PM By: Army Melia Entered By: Army Melia on 05/04/2019 15:15:28 [name] ([id number]) -------------------------------------------------------------------------------- Vitals Details Patient Name: [name] Date of Service: 05/04/2019 3:00 PM Medical Record Number: [id number] Patient Account Number: [id number] Date of Birth/Sex: 03/19/1951 (68 y.o. F) Treating RN: Army Melia Primary Care Kassidi Elza: Caryl Bis, ERIC Other Clinician: Referring Bettina Warn: Caryl Bis, ERIC Treating Treg Diemer/Extender: Melburn Hake, HOYT Weeks in Treatment: 2 Vital Signs Time Taken: 15:12 Temperature (F): 99.2 Height (in): 67 Pulse (bpm): 75 Weight (lbs): 142 Respiratory Rate (breaths/min): 16 Body Mass Index (BMI): 22.2 Blood Pressure (mmHg): 103/67 Reference Range: 80 - 120 mg / dl Electronic Signature(s) Signed: 05/04/2019 3:40:05 PM By: Army Melia Entered By: Army Melia on 05/04/2019 15:13:38

## 2019-05-06 NOTE — Progress Notes (Signed)
Virtual Visit via video Note  This visit type was conducted due to national recommendations for restrictions regarding the COVID-19 pandemic (e.g. social distancing).  This format is felt to be most appropriate for this patient at this time.  All issues noted in this document were discussed and addressed.  No physical exam was performed (except for noted visual exam findings with Video Visits).   I connected with Amy Mcconnell today at  8:30 AM EDT by a video enabled telemedicine application and verified that I am speaking with the correct person using two identifiers. Location patient: home Location provider: work  Persons participating in the virtual visit: patient, provider  I discussed the limitations, risks, security and privacy concerns of performing an evaluation and management service by telephone and the availability of in person appointments. I also discussed with the patient that there may be a patient responsible charge related to this service. The patient expressed understanding and agreed to proceed.  Reason for visit: Follow-up.  HPI: Scalp laceration: Patient is status post fall about 3 weeks ago.  She had a significant laceration on her scalp.  She has been following with wound care and has home health following her as well.  She is healing quite quickly and does not have any significant pain.  She did have an area that drained blood and when they pulled the scab off there was an arterial bleed for which she now has a cellulose plug in place that she is not supposed to remove or touch to any degree.  She has had no additional bleeding.  Neck and shoulder pain: Patient notes that since she has fallen she has had issues with some neck and shoulder discomfort.  She had a CT cervical spine that was unremarkable for fracture.  She has trouble turning to the left and right and leaning back with her neck.  No radiation of the pain down her arms.  No numbness or weakness.  She notes  somebody told her this was a whiplash injury.  Left foot pain: Patient notes this has been bothering her since she was evaluated in the emergency room.  She noted it was purple at the hospital and that has improved.  She notes it has improved quite a bit though remains stiff and bothersome.  It is swollen across the top.  She has been taking Tylenol every 6 hours for this.  She was walking with a cane previously though has abandoned that.   ROS: See pertinent positives and negatives per HPI.  Past Medical History:  Diagnosis Date  . 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 Anxiety   . Bipolar disorder ([place])    Dr. Thurmond Butts - every 3 months  . Broken foot    left   . Contact dermatitis and other eczema due to other specified agent   . Contusion of unspecified part of lower limb   . Essential hypertension, benign   . Heart murmur   . Other and unspecified hyperlipidemia   . Other specified disorders of thyroid   . Reflux esophagitis   . Tachycardia    a. isolated episode, seen in ED with negative work up    Past Surgical History:  Procedure Laterality Date  . CATARACT EXTRACTION    . SALPINGECTOMY Left   . STERILIZATION     Her decision since dz of bipolar    Family History  Problem Relation Age of Onset  . Arrhythmia Mother  A-Fib  . Heart failure Mother   . Hyperlipidemia Mother   . Hypertension Mother   . Alzheimer's disease Mother   . Hypertension Father   . Hyperlipidemia Father   . Mental retardation Father 47       Suicide  . Hypertension Brother   . Hyperlipidemia Brother   . Hypertension Brother   . Hyperlipidemia Brother   . Hyperlipidemia Brother   . Hypertension Brother   . Alcohol abuse Brother   . Depression Brother   . Hyperlipidemia Brother   . Hypertension Brother   . Breast cancer Maternal Aunt     SOCIAL HX: Former smoker   Current Outpatient Medications:  .  acetaminophen (TYLENOL) 500 MG tablet, Take 1,000 mg by mouth every 6  (six) hours as needed., Disp: , Rfl:  .  amLODipine (NORVASC) 5 MG tablet, TAKE 1 TABLET BY MOUTH ONCE DAILY, Disp: 90 tablet, Rfl: 3 .  Ascorbic Acid (VITAMIN C) 1000 MG tablet, Take 1,000 mg by mouth daily., Disp: , Rfl:  .  atorvastatin (LIPITOR) 80 MG tablet, Take 1 tablet by mouth once daily, Disp: 90 tablet, Rfl: 1 .  B Complex Vitamins (VITAMIN B COMPLEX PO), Take by mouth daily., Disp: , Rfl:  .  calcium carbonate (OS-CAL) 600 MG TABS tablet, Take 600 mg by mouth 2 (two) times daily with a meal. , Disp: , Rfl:  .  Cholecalciferol (VITAMIN D3) 50 MCG (2000 UT) capsule, Take 2,000 Units by mouth daily., Disp: , Rfl:  .  diphenhydrAMINE (BENADRYL) 25 mg capsule, Take 25 mg by mouth daily as needed for allergies. For food and fragrance sensitivities, Disp: , Rfl:  .  doxazosin (CARDURA) 8 MG tablet, Take 1 tablet by mouth twice daily, Disp: 180 tablet, Rfl: 1 .  EPINEPHrine 0.3 mg/0.3 mL IJ SOAJ injection, Inject 0.3 mLs (0.3 mg total) into the muscle once., Disp: 2 Device, Rfl: 0 .  fexofenadine (ALLEGRA) 180 MG tablet, Take 180 mg by mouth. 2-3 times daily prn For food and fragrance sensitivities/intolerances, Disp: , Rfl:  .  levothyroxine (SYNTHROID, LEVOTHROID) 125 MCG tablet, Take 125 mcg by mouth daily before breakfast., Disp: , Rfl:  .  lithium carbonate 150 MG capsule, Take 150 mg by mouth 2 (two) times daily with a meal., Disp: , Rfl:  .  magnesium oxide (MAG-OX) 400 MG tablet, Take 400 mg by mouth daily., Disp: , Rfl:  .  Multiple Vitamins-Minerals (MULTIVITAMIN WITH MINERALS) tablet, Take 1 tablet by mouth daily., Disp: , Rfl:  .  Multiple Vitamins-Minerals (VISION FORMULA/LUTEIN) TABS, Take by mouth., Disp: , Rfl:  .  Oxcarbazepine (TRILEPTAL) 300 MG tablet, Take 300 mg one tablet in the am & two tablets in the pm., Disp: , Rfl:  .  propranolol ER (INDERAL LA) 60 MG 24 hr capsule, TAKE 1 CAPSULE BY MOUTH ONCE DAILY, Disp: 90 capsule, Rfl: 3 .  Tdap (BOOSTRIX) 5-2.5-18.5 LF-MCG/0.5  injection, Inject 0.5 mLs into the muscle once., Disp: 0.5 mL, Rfl: 0 .  vitamin B-12 (CYANOCOBALAMIN) 1000 MCG tablet, Take 1,000 mcg by mouth daily., Disp: , Rfl:  .  zinc gluconate 50 MG tablet, Take 50 mg by mouth daily., Disp: , Rfl:  .  ziprasidone (GEODON) 60 MG capsule, Take 60 mg by mouth daily., Disp: , Rfl:  .  aspirin 81 MG tablet, Take 81 mg by mouth daily., Disp: , Rfl:  .  Flaxseed, Linseed, (FLAXSEED OIL PO), Take by mouth., Disp: , Rfl:  .  Omega-3  Fatty Acids (FISH OIL) 1000 MG CAPS, Take by mouth 2 (two) times daily. , Disp: , Rfl:  .  vitamin E 1000 UNIT capsule, Take 1,000 Units by mouth daily., Disp: , Rfl:   EXAM:  VITALS per patient if applicable: None.  GENERAL: alert, oriented, appears well and in no acute distress  HEENT: atraumatic, conjunttiva clear, no obvious abnormalities on inspection of external nose and ears  NECK: normal movements of the head and neck  LUNGS: on inspection no signs of respiratory distress, breathing rate appears normal, no obvious gross SOB, gasping or wheezing  CV: no obvious cyanosis  MS: moves all visible extremities without noticeable abnormality  PSYCH/NEURO: pleasant and cooperative, no obvious depression or anxiety, speech and thought processing grossly intact  ASSESSMENT AND PLAN:  Discussed the following assessment and plan:  Scalp laceration Seems to be healing quite well with this.  She will continue to follow with wound care.  Left foot pain She continues to have issues with this.  She had negative x-ray.  We will see if physical therapy can work with her.  She will continue Tylenol as needed.  Neck pain I suspect whiplash injury.  This has not worsened since her prior injury.  Discussed that I did not think that she would need additional x-rays at this time.  We will have physical therapy work with her.    I discussed the assessment and treatment plan with the patient. The patient was provided an opportunity to  ask questions and all were answered. The patient agreed with the plan and demonstrated an understanding of the instructions.   The patient was advised to call back or seek an in-person evaluation if the symptoms worsen or if the condition fails to improve as anticipated.   Tommi Rumps, MD

## 2019-05-06 NOTE — Assessment & Plan Note (Signed)
She continues to have issues with this.  She had negative x-ray.  We will see if physical therapy can work with her.  She will continue Tylenol as needed.

## 2019-05-11 DIAGNOSIS — I10 Essential (primary) hypertension: Secondary | ICD-10-CM | POA: Diagnosis not present

## 2019-05-11 DIAGNOSIS — F3189 Other bipolar disorder: Secondary | ICD-10-CM | POA: Diagnosis not present

## 2019-05-11 DIAGNOSIS — Z4800 Encounter for change or removal of nonsurgical wound dressing: Secondary | ICD-10-CM | POA: Diagnosis not present

## 2019-05-11 DIAGNOSIS — S0181XD Laceration without foreign body of other part of head, subsequent encounter: Secondary | ICD-10-CM | POA: Diagnosis not present

## 2019-05-11 DIAGNOSIS — W19XXXD Unspecified fall, subsequent encounter: Secondary | ICD-10-CM | POA: Diagnosis not present

## 2019-05-11 DIAGNOSIS — E039 Hypothyroidism, unspecified: Secondary | ICD-10-CM | POA: Diagnosis not present

## 2019-05-17 ENCOUNTER — Encounter: Payer: Self-pay | Admitting: Emergency Medicine

## 2019-05-17 ENCOUNTER — Emergency Department
Admission: EM | Admit: 2019-05-17 | Discharge: 2019-05-17 | Disposition: A | Discharge status: Home / Self Care | Payer: Medicare HMO | Attending: Emergency Medicine | Admitting: Emergency Medicine

## 2019-05-17 ENCOUNTER — Other Ambulatory Visit: Payer: Self-pay

## 2019-05-17 DIAGNOSIS — Z79899 Other long term (current) drug therapy: Secondary | ICD-10-CM | POA: Insufficient documentation

## 2019-05-17 DIAGNOSIS — Y999 Unspecified external cause status: Secondary | ICD-10-CM | POA: Insufficient documentation

## 2019-05-17 DIAGNOSIS — Y929 Unspecified place or not applicable: Secondary | ICD-10-CM | POA: Diagnosis not present

## 2019-05-17 DIAGNOSIS — Y9389 Activity, other specified: Secondary | ICD-10-CM | POA: Insufficient documentation

## 2019-05-17 DIAGNOSIS — I10 Essential (primary) hypertension: Secondary | ICD-10-CM | POA: Diagnosis not present

## 2019-05-17 DIAGNOSIS — Z87891 Personal history of nicotine dependence: Secondary | ICD-10-CM | POA: Diagnosis not present

## 2019-05-17 DIAGNOSIS — S0103XA Puncture wound without foreign body of scalp, initial encounter: Secondary | ICD-10-CM | POA: Insufficient documentation

## 2019-05-17 DIAGNOSIS — X58XXXA Exposure to other specified factors, initial encounter: Secondary | ICD-10-CM | POA: Insufficient documentation

## 2019-05-17 MED ORDER — LIDOCAINE HCL (PF) 1 % IJ SOLN
INTRAMUSCULAR | Status: AC
  Filled 2019-05-17: qty 15

## 2019-05-17 MED ORDER — CEPHALEXIN 500 MG PO CAPS: 500.0000 mg | ORAL_CAPSULE | Freq: Three times a day (TID) | ORAL | 0 refills | Status: AC

## 2019-05-17 MED ORDER — LIDOCAINE-EPINEPHRINE (PF) 2 %-1:200000 IJ SOLN
10.0000 mL | Freq: Once | INTRAMUSCULAR | Status: DC
  Filled 2019-05-17: qty 10

## 2019-05-17 NOTE — ED Notes (Signed)
Suture cart placed at bedside. 

## 2019-05-17 NOTE — ED Provider Notes (Signed)
[facility] Emergency Department Provider Note  ____________________________________________   First MD Initiated Contact with Patient 05/17/19 0901     (approximate)  I have reviewed the triage vital signs and the nursing notes.   HISTORY  Chief Complaint Head Bleeding    HPI Amy Mcconnell is a 68 y.o. female  Here with head/scalp wound. Pt sustained large lac to scalp just over 1 month ago, was seen here and treated. Pt reports that she's been healing well and seeing wound clinic for this. However, she has had a small superficial area of skin breakdown along her left temple that has had persistent artiolar bleeding. She denies any new wounds. She put her glasses on earlier and hit the wound, causing the wound to begin bleeding again. Controlled w/ light pressure. Denies any CP, SOB, lightheadedness. No increased swelling or pain to the site. She is requesting the wound be closed.        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 Thurmond Butts - every 3 months   Broken foot    left    Contact dermatitis and other eczema due to other specified agent    Contusion of unspecified part of lower limb    Essential hypertension, benign    Heart murmur    Other and unspecified hyperlipidemia    Other specified disorders of thyroid    Reflux esophagitis    Tachycardia    a. 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 Authorizing Provider  acetaminophen (TYLENOL) 500 MG tablet Take 1,000 mg by mouth every 6 (six) hours as needed.    [provider]  amLODipine (NORVASC) 5 MG tablet TAKE 1 TABLET BY MOUTH ONCE DAILY 10/30/18   Minna Merritts, MD  Ascorbic Acid (VITAMIN C) 1000 MG tablet Take 1,000 mg by mouth daily.    [provider]  aspirin 81 MG tablet Take 81 mg by mouth daily.    [provider]  atorvastatin (LIPITOR) 80 MG tablet Take 1 tablet by mouth once daily 01/30/19   Minna Merritts, MD  B Complex Vitamins (VITAMIN B COMPLEX PO) Take by mouth daily.    [provider]  calcium carbonate (OS-CAL) 600 MG TABS tablet Take 600 mg by mouth 2 (two) times daily with a meal.     [provider]  cephALEXin (KEFLEX) 500 MG capsule Take 1 capsule (500 mg total) by mouth 3 (three) times daily for 5 days. 05/17/19 05/22/19  Duffy Bruce, MD  Cholecalciferol (VITAMIN D3) 50 MCG (2000 UT) capsule Take 2,000 Units by mouth daily.    [provider]  diphenhydrAMINE (BENADRYL) 25 mg capsule Take 25 mg by mouth daily as needed for allergies. For food and fragrance sensitivities    [provider]  doxazosin (CARDURA) 8 MG tablet Take 1 tablet by mouth twice daily 01/30/19   Minna Merritts, MD  EPINEPHrine 0.3 mg/0.3 mL IJ SOAJ injection Inject 0.3 mLs (0.3 mg total) into the muscle once. 03/30/16   Leone Haven, MD  fexofenadine (ALLEGRA) 180 MG tablet Take 180 mg by mouth. 2-3 times daily prn For food and fragrance sensitivities/intolerances    [provider]    Flaxseed, Linseed, (FLAXSEED OIL PO) Take by mouth.    [provider]  levothyroxine (SYNTHROID, LEVOTHROID) 125 MCG tablet Take 125 mcg by mouth daily before breakfast.    [provider]  lithium carbonate 150 MG capsule Take 150 mg by mouth 2 (two) times daily with a meal.    [provider]  magnesium oxide (MAG-OX) 400 MG tablet Take 400 mg by mouth daily.    [provider]  Multiple Vitamins-Minerals (MULTIVITAMIN WITH MINERALS) tablet Take 1 tablet by mouth daily.    [provider]  Multiple Vitamins-Minerals (VISION FORMULA/LUTEIN) TABS Take by mouth.    [provider]  Omega-3 Fatty Acids (FISH OIL) 1000 MG CAPS Take by mouth 2 (two) times daily.     [provider]  Oxcarbazepine (TRILEPTAL) 300 MG tablet Take 300 mg one tablet in the am & two tablets in the pm.    [provider]  propranolol ER (INDERAL LA) 60 MG 24 hr capsule TAKE 1 CAPSULE BY MOUTH ONCE DAILY 10/17/18   Minna Merritts, MD  Tdap (BOOSTRIX) 5-2.5-18.5 LF-MCG/0.5 injection Inject 0.5 mLs into the muscle once. 12/29/14   Crecencio Mc, MD  vitamin B-12 (CYANOCOBALAMIN) 1000 MCG tablet Take 1,000 mcg by mouth daily.    [provider]  vitamin E 1000 UNIT capsule Take 1,000 Units by mouth daily.    [provider]  zinc gluconate 50 MG tablet Take 50 mg by mouth daily.    [provider]  ziprasidone (GEODON) 60 MG capsule Take 60 mg by mouth daily.    [provider]    Allergies Ace inhibitors, Angiotensin receptor blockers, Benicar [olmesartan], Poison ivy extract [poison ivy extract], Purell instant hand [alcohol], and Triclosan  Family History  Problem Relation Age of Onset   Arrhythmia Mother        A-Fib   Heart failure Mother    Hyperlipidemia Mother    Hypertension Mother    Alzheimer's disease Mother    Hypertension Father    Hyperlipidemia Father    Mental retardation Father 58        Suicide   Hypertension Brother    Hyperlipidemia Brother    Hypertension Brother    Hyperlipidemia Brother    Hyperlipidemia Brother    Hypertension Brother    Alcohol abuse Brother    Depression Brother    Hyperlipidemia Brother    Hypertension Brother    Breast cancer Maternal Aunt     Social History Social History   Tobacco Use   Smoking status: Former Smoker    Packs/day: 0.50    Years: 13.00    Pack years: 6.50    Quit date: 01/23/2000    Years since quitting: 19.3   Smokeless tobacco: Never Used  Substance Use Topics   Alcohol use: No   Drug use: No    Review of Systems  Review of Systems  Constitutional: Negative for chills, fatigue and fever.  HENT: Negative for congestion and sore throat.   Eyes: Negative for visual disturbance.  Respiratory: Negative for cough and shortness of breath.   Cardiovascular: Negative for chest pain.  Gastrointestinal: Negative for abdominal pain, diarrhea, nausea and vomiting.  Genitourinary: Negative for flank pain.  Musculoskeletal: Negative for back pain and neck pain.  Skin: Positive for wound. Negative for rash.  Allergic/Immunologic: Negative for immunocompromised state.  Neurological: Negative for weakness and numbness.  Hematological: Does not bruise/bleed easily.     ____________________________________________  PHYSICAL EXAM:      VITAL SIGNS: ED Triage Vitals [05/17/19 0854]  Enc Vitals Group     BP      Pulse      Resp      Temp      Temp src      SpO2      Weight      Height      Head Circumference      Peak Flow      Pain Score 0     Pain Loc      Pain Edu?      Excl. in [place]?      Physical Exam Vitals signs and nursing note reviewed.  Constitutional:      General: She is not in acute distress.    Appearance: She is well-developed.  HENT:     Head: Normocephalic and atraumatic.     Comments: Approx 0.75 cm irregular wound to left temple, with apparent capillary bleeding,  no surrounding hematoma. No redness. No surrounding swelling. Scalp wound well healed and well approximated. Eyes:     Conjunctiva/sclera: Conjunctivae normal.  Neck:     Musculoskeletal: Neck supple.  Cardiovascular:     Rate and Rhythm: Normal rate and regular rhythm.     Heart sounds: Normal heart sounds.  Pulmonary:     Effort: Pulmonary effort is normal. No respiratory distress.     Breath sounds: No wheezing.  Abdominal:     General: There is no distension.  Skin:    General: Skin is warm.     Capillary Refill: Capillary refill takes less than 2 seconds.     Findings: No rash.  Neurological:     Mental Status: She is alert and oriented to person, place, and time.     Motor: No abnormal muscle tone.       ____________________________________________   LABS (all labs ordered are listed, but only abnormal results are displayed)  Labs Reviewed - No data to display  ____________________________________________  EKG: None ________________________________________  RADIOLOGY All imaging, including plain films, CT scans, and ultrasounds, independently reviewed by me, and interpretations confirmed via formal radiology reads.  ED MD interpretation:   None  Official radiology report(s): No results found.  ____________________________________________  PROCEDURES   Procedure(s) performed (including Critical Care):  Marland KitchenMarland KitchenLaceration Repair  Date/Time: 05/17/2019 9:50 AM Performed by: Duffy Bruce, MD Authorized by: Duffy Bruce, MD   Consent:    Consent obtained:  Verbal   Consent given by:  Patient   Risks discussed:  Infection, need for additional repair, pain, tendon damage, retained foreign body, vascular damage, poor cosmetic result, poor wound healing and nerve damage   Alternatives discussed:  Referral and delayed treatment Anesthesia (see MAR for exact dosages):    Anesthesia method:  Local infiltration   Local anesthetic:  Lidocaine 1% WITH  epi Laceration details:    Location:  Face   Face location:  Forehead   Length (cm):  0.8 Repair type:    Repair type:  Simple Pre-procedure details:    Preparation:  Patient was prepped and draped in usual sterile fashion and imaging obtained to evaluate for foreign bodies Exploration:    Hemostasis achieved with:  Direct pressure   Wound exploration: wound explored through full range of motion and entire depth of wound probed and visualized   Treatment:    Area cleansed with:  Betadine   Amount of cleaning:  Extensive   Irrigation solution:  Sterile water   Irrigation method:  Pressure wash Mucous membrane repair:    Suture size:  6-0   Wound mucous membrane closure material used: Ethilon.   Suture technique:  Simple interrupted   Number of sutures:  3 Approximation:    Approximation:  Close Post-procedure details:    Dressing:  Open (no dressing)   Patient tolerance of procedure:  Tolerated well, no immediate complications    ____________________________________________  INITIAL IMPRESSION / MDM / ASSESSMENT AND PLAN / ED COURSE  As part of my medical decision making, I reviewed the following data within the [place] Notes from prior ED visits and [place] Controlled Substance Database      *BETTYJEAN STEFANSKI was evaluated in Emergency Department on 05/17/2019 for the symptoms described in the history of present illness. She was evaluated in the context of the global COVID-19 pandemic, which necessitated consideration that the patient might be at risk for infection with the SARS-CoV-2 virus that causes COVID-19. Institutional protocols and algorithms that pertain to the evaluation of patients at risk for COVID-19 are in a state of rapid change based on information released by regulatory bodies including the CDC and federal and state organizations. These policies and algorithms were followed during the patient's care in the ED.  Some ED evaluations and interventions  may be delayed as a result of limited staffing during the pandemic.*      Medical Decision Making: 68 yo F here with left scalp wound. Overall, her wound appears to be healing very well. The small, punctate area of bleeding is likely 2/2 superficial arteriolar bleeding under the distal, inferior edge of the wound. Pt is adamant she wants this closed. I discussed risks of poor cosemesis, skin necrosis, and infection given delayed nature of closure but pt adamant to have it closed. Given underlying arteriolar bleeding I think this is reasonable. Following hemostasis w/ direct pressure, wound edges sharply excised then closed with 6-0 suture. Dermabond applied as well to aid in closure. Tolerated well w/ total hemostasis. Will start on ppx ABX, refer for close wound f/u.  ____________________________________________  FINAL CLINICAL IMPRESSION(S) / ED DIAGNOSES  Final diagnoses:  Puncture wound of scalp without foreign body, initial encounter     MEDICATIONS GIVEN DURING THIS VISIT:  Medications  lidocaine (PF) (XYLOCAINE) 1 % injection (has no administration in time range)     ED Discharge Orders         Ordered    cephALEXin (KEFLEX) 500 MG capsule  3 times daily     05/17/19 1610           Note:  This document was prepared using Dragon voice recognition software and may include unintentional dictation errors.   Duffy Bruce, MD 05/17/19 [phone]

## 2019-05-17 NOTE — ED Triage Notes (Signed)
Pt arrived via [place] with brother, states she fell on 6/3 and had 35 staples placed, pt states her hgb dropped at one point, pt has been following up at the wound center and states 1.5 weeks ago a scab came off and found the patient had an arterial bleed, pt had cellulose dressing placed.  Pt states this morning her glasses caused the scab to come off and started bleeding again.  Pt is not on any blood thinning medication at this time.

## 2019-05-19 ENCOUNTER — Encounter: Payer: Medicare HMO | Attending: Internal Medicine | Admitting: Internal Medicine

## 2019-05-19 ENCOUNTER — Other Ambulatory Visit: Payer: Self-pay

## 2019-05-19 DIAGNOSIS — T8131XA Disruption of external operation (surgical) wound, not elsewhere classified, initial encounter: Secondary | ICD-10-CM | POA: Diagnosis not present

## 2019-05-19 DIAGNOSIS — W19XXXA Unspecified fall, initial encounter: Secondary | ICD-10-CM | POA: Insufficient documentation

## 2019-05-19 DIAGNOSIS — S0101XA Laceration without foreign body of scalp, initial encounter: Secondary | ICD-10-CM | POA: Diagnosis not present

## 2019-05-19 DIAGNOSIS — I10 Essential (primary) hypertension: Secondary | ICD-10-CM | POA: Diagnosis not present

## 2019-05-19 DIAGNOSIS — F319 Bipolar disorder, unspecified: Secondary | ICD-10-CM | POA: Insufficient documentation

## 2019-05-19 DIAGNOSIS — Z87891 Personal history of nicotine dependence: Secondary | ICD-10-CM | POA: Diagnosis not present

## 2019-05-19 DIAGNOSIS — E039 Hypothyroidism, unspecified: Secondary | ICD-10-CM | POA: Insufficient documentation

## 2019-05-20 NOTE — Progress Notes (Signed)
FINNLEY, LEWIS ([id number]) Visit Report for 05/19/2019 Arrival Information Details Patient Name: Amy Mcconnell, Amy Mcconnell Date of Service: 05/19/2019 10:30 AM Medical Record Number: [id number] Patient Account Number: [id number] Date of Birth/Sex: April 09, 1951 (67 y.o. F) Treating RN: Army Melia Primary Care Candi Profit: Caryl Bis, ERIC Other Clinician: Referring Cherylyn Sundby: Caryl Bis, ERIC Treating Jay Haskew/Extender: Amy Mcconnell in Treatment: 4 Visit Information History Since Last Visit Added or deleted any medications: No Patient Arrived: Ambulatory Any new allergies or adverse reactions: No Arrival Time: 10:32 Had a fall or experienced change in No Accompanied By: self activities of daily living that may affect Transfer Assistance: None risk of falls: Signs or symptoms of abuse/neglect since last visito No Hospitalized since last visit: No Has Dressing in Place as Prescribed: Yes Pain Present Now: No Electronic Signature(s) Signed: 05/19/2019 3:04:02 PM By: Army Melia Entered By: Army Melia on 05/19/2019 10:33:01 Amy Mcconnell ([id number]) -------------------------------------------------------------------------------- Clinic Level of Care Assessment Details Patient Name: Amy Mcconnell Date of Service: 05/19/2019 10:30 AM Medical Record Number: [id number] Patient Account Number: [id number] Date of Birth/Sex: [birth year] (67 y.o. F) Treating RN: Montey Hora Primary Care Kaly Mcquary: Caryl Bis, ERIC Other Clinician: Referring Deryl Ports: Caryl Bis, ERIC Treating Avah Bashor/Extender: Amy Mcconnell in Treatment: 4 Clinic Level of Care Assessment Items TOOL 4 Quantity Score _0  - Use when only an EandM is performed on FOLLOW-UP visit 0 ASSESSMENTS - Nursing Assessment / Reassessment X - Reassessment of Co-morbidities (includes updates in patient status) 1 10 X- 1 5 Reassessment of Adherence to Treatment Plan ASSESSMENTS - Wound and Skin Assessment /  Reassessment X - Simple Wound Assessment / Reassessment - one wound 1 5 _1  - 0 Complex Wound Assessment / Reassessment - multiple wounds _2  - 0 Dermatologic / Skin Assessment (not related to wound area) ASSESSMENTS - Focused Assessment _3  - Circumferential Edema Measurements - multi extremities 0 _4  - 0 Nutritional Assessment / Counseling / Intervention _5  - 0 Lower Extremity Assessment (monofilament, tuning fork, pulses) _6  - 0 Peripheral Arterial Disease Assessment (using hand held doppler) ASSESSMENTS - Ostomy and/or Continence Assessment and Care _7  - Incontinence Assessment and Management 0 _8  - 0 Ostomy Care Assessment and Management (repouching, etc.) PROCESS - Coordination of Care X - Simple Patient / Family Education for ongoing care 1 15 _9  - 0 Complex (extensive) Patient / Family Education for ongoing care X- 1 10 Staff obtains Programmer, systems, Records, Test Results / Process Orders _10  - 0 Staff telephones HHA, Nursing Homes / Clarify orders / etc _11  - 0 Routine Transfer to another Facility (non-emergent condition) _12  - 0 Routine Hospital Admission (non-emergent condition) _13  - 0 New Admissions / Biomedical engineer / Ordering NPWT, Apligraf, etc. _14  - 0 Emergency Hospital Admission (emergent condition) X- 1 10 Simple Discharge Coordination Amy Mcconnell, Amy Mcconnell ([id number]) _15  - 0 Complex (extensive) Discharge Coordination PROCESS - Special Needs _16  - Pediatric / Minor Patient Management 0 _17  - 0 Isolation Patient Management _18  - 0 Hearing / Language / Visual special needs _19  - 0 Assessment of Community assistance (transportation, D/C planning, etc.) _20  - 0 Additional assistance / Altered mentation _21  - 0 Support Surface(s) Assessment (bed, cushion, seat, etc.) INTERVENTIONS - Wound Cleansing / Measurement X - Simple Wound Cleansing - one wound 1 5 _22  - 0 Complex Wound Cleansing - multiple wounds X- 1 5 Wound Imaging (photographs - any number of  wounds) _23  - 0 Wound Tracing (instead of photographs) X- 1 5 Simple Wound Measurement - one wound _24  - 0 Complex Wound Measurement -  multiple wounds INTERVENTIONS - Wound Dressings _0  - Small Wound Dressing one or multiple wounds 0 _1  - 0 Medium Wound Dressing one or multiple wounds _2  - 0 Large Wound Dressing one or multiple wounds X- 1 5 Application of Medications - topical <[id number]>_2<\/[id number]>_9  - 0 Application of Medications - injection INTERVENTIONS - Miscellaneous _4  - External ear exam 0 _5  - 0 Specimen Collection (cultures, biopsies, blood, body fluids, etc.) _6  - 0 Specimen(s) / Culture(s) sent or taken to Lab for analysis _7  - 0 Patient Transfer (multiple staff / Harrel Lemon Lift / Similar devices) _8  - 0 Simple Staple / Suture removal (25 or less) _9  - 0 Complex Staple / Suture removal (26 or more) _10  - 0 Hypo / Hyperglycemic Management (close monitor of Blood Glucose) _11  - 0 Ankle / Brachial Index (ABI) - do not check if billed separately X- 1 5 Vital Signs Amy Mcconnell, Amy Mcconnell. ([id number]) Has the patient been seen at the hospital within the last three years: Yes Total Score: 80 Level Of Care: New/Established - Level 3 Electronic Signature(s) Signed: 05/19/2019 4:43:58 PM By: Montey Hora Entered By: Montey Hora on 05/19/2019 10:47:03 Amy Mcconnell ([id number]) -------------------------------------------------------------------------------- Encounter Discharge Information Details Patient Name: Amy Mcconnell Date of Service: 05/19/2019 10:30 AM Medical Record Number: [id number] Patient Account Number: [id number] Date of Birth/Sex: November 23, 1950 (67 y.o. F) Treating RN: Montey Hora Primary Care Shivaay Stormont: Caryl Bis, ERIC Other Clinician: Referring Rosaland Shiffman: Caryl Bis, ERIC Treating Amy Mcconnell/Extender: Amy Mcconnell in Treatment: 4 Encounter Discharge Information Items Discharge Condition: Stable Ambulatory Status: Ambulatory Discharge Destination:  Home Transportation: Private Auto Accompanied By: self Schedule Follow-up Appointment: Yes Clinical Summary of Care: Electronic Signature(s) Signed: 05/19/2019 4:43:58 PM By: Montey Hora Entered By: Montey Hora on 05/19/2019 10:49:01 Amy Mcconnell ([id number]) -------------------------------------------------------------------------------- Lower Extremity Assessment Details Patient Name: Amy Mcconnell Date of Service: 05/19/2019 10:30 AM Medical Record Number: [id number] Patient Account Number: [id number] Date of Birth/Sex: [birth year] (67 y.o. F) Treating RN: Army Melia Primary Care Cordarryl Monrreal: Caryl Bis ERIC Other Clinician: Referring Cathyrn Deas: Caryl Bis, ERIC Treating Lory Nowaczyk/Extender: Amy Mcconnell in Treatment: 4 Electronic Signature(s) Signed: 05/19/2019 3:04:02 PM By: Army Melia Entered By: Army Melia on 05/19/2019 10:35:16 Amy Mcconnell ([id number]) -------------------------------------------------------------------------------- Multi Wound Chart Details Patient Name: Amy Mcconnell Date of Service: 05/19/2019 10:30 AM Medical Record Number: [id number] Patient Account Number: [id number] Date of Birth/Sex: 03/19/1951 (67 y.o. F) Treating RN: Montey Hora Primary Care Jailon Schaible: Caryl Bis, ERIC Other Clinician: Referring Tmya Wigington: Caryl Bis, ERIC Treating Rockne Dearinger/Extender: Tannya Mcconnell in Treatment: 4 Vital Signs Height(in): 76 Pulse(bpm): 76 Weight(lbs): 142 Blood Pressure(mmHg): 147/74 Body Mass Index(BMI): 22 Temperature(F): 98.3 Respiratory Rate 16 (breaths/min): Photos: [N/A:N/A] Wound Location: Left Head - frontal N/A N/A Wounding Event: Trauma N/A N/A Primary Etiology: Trauma, Other N/A N/A Comorbid History: Hypertension N/A N/A Date Acquired: 04/08/2019 N/A N/A Weeks of Treatment: 4 N/A N/A Wound Status: Open N/A N/A Measurements L x W x D 0.1x0.1x0.1 N/A N/A (cm) Area (cm) : 0.008 N/A N/A Volume  (cm) : 0.001 N/A N/A % Reduction in Area: 50.00% N/A N/A % Reduction in Volume: 83.30% N/A N/A Classification: Full Thickness Without N/A N/A Exposed Support Structures Exudate Amount: Medium N/A N/A Exudate Type: Serosanguineous N/A N/A Exudate Color: red, brown N/A N/A Wound Margin: Well defined, not attached N/A N/A Granulation Amount: Small (1-33%) N/A N/A Granulation Quality: Pink N/A N/A Necrotic Amount: None Present (0%) N/A N/A Exposed Structures: Fat Layer (Subcutaneous N/A N/A Tissue) Exposed: Yes Fascia: No Tendon: No Muscle: No Joint: No Bone:  No Amy Mcconnell, Amy Mcconnell ([id number]) Epithelialization: None N/A N/A Treatment Notes Electronic Signature(s) Signed: 05/19/2019 4:43:58 PM By: Montey Hora Entered By: Montey Hora on 05/19/2019 10:43:45 Amy Mcconnell ([id number]) -------------------------------------------------------------------------------- Multi-Disciplinary Care Plan Details Patient Name: Amy Mcconnell Date of Service: 05/19/2019 10:30 AM Medical Record Number: [id number] Patient Account Number: [id number] Date of Birth/Sex: 09/11/1951 (67 y.o. F) Treating RN: Montey Hora Primary Care Espen Bethel: Caryl Bis, ERIC Other Clinician: Referring Eshawn Coor: Caryl Bis, ERIC Treating Vernecia Umble/Extender: Amy Mcconnell in Treatment: 4 Active Inactive Abuse / Safety / Falls / Self Care Management Nursing Diagnoses: History of Falls Goals: Patient will not experience any injury related to falls Date Initiated: 04/24/2019 Target Resolution Date: 07/11/2019 Goal Status: Active Interventions: Assess fall risk on admission and as needed Notes: Orientation to the Wound Care Program Nursing Diagnoses: Knowledge deficit related to the wound healing center program Goals: Patient/caregiver will verbalize understanding of the [facility] Program Date Initiated: 04/24/2019 Target Resolution Date: 07/11/2019 Goal Status:  Active Interventions: Provide education on orientation to the wound center Notes: Pain, Acute or Chronic Nursing Diagnoses: Pain, acute or chronic: actual or potential Goals: Patient/caregiver will verbalize comfort level met Date Initiated: 04/24/2019 Target Resolution Date: 07/11/2019 Goal Status: Active Interventions: Assess comfort goal upon admission Amy Mcconnell, Amy Mcconnell ([id number]) Notes: Wound/Skin Impairment Nursing Diagnoses: Impaired tissue integrity Goals: Ulcer/skin breakdown will have a volume reduction of 30% by week 4 Date Initiated: 04/20/2019 Target Resolution Date: 05/20/2019 Goal Status: Active Interventions: Assess patient/caregiver ability to obtain necessary supplies Assess patient/caregiver ability to perform ulcer/skin care regimen upon admission and as needed Assess ulceration(s) every visit Provide education on ulcer and skin care Notes: Electronic Signature(s) Signed: 05/19/2019 4:43:58 PM By: Montey Hora Entered By: Montey Hora on 05/19/2019 10:43:34 Amy Mcconnell ([id number]) -------------------------------------------------------------------------------- Pain Assessment Details Patient Name: Amy Mcconnell Date of Service: 05/19/2019 10:30 AM Medical Record Number: [id number] Patient Account Number: [id number] Date of Birth/Sex: 12/01/1950 (67 y.o. F) Treating RN: Army Melia Primary Care Keegan Ducey: Caryl Bis, ERIC Other Clinician: Referring Detta Mellin: Caryl Bis, ERIC Treating Zalma Channing/Extender: Amy Mcconnell in Treatment: 4 Active Problems Location of Pain Severity and Description of Pain Patient Has Paino No Site Locations Pain Management and Medication Current Pain Management: Electronic Signature(s) Signed: 05/19/2019 3:04:02 PM By: Army Melia Entered By: Army Melia on 05/19/2019 10:33:11 Amy Mcconnell  ([id number]) -------------------------------------------------------------------------------- Patient/Caregiver Education Details Patient Name: Amy Mcconnell Date of Service: 05/19/2019 10:30 AM Medical Record Number: [id number] Patient Account Number: [id number] Date of Birth/Gender: 08/18/1951 (67 y.o. F) Treating RN: Montey Hora Primary Care Physician: Caryl Bis, ERIC Other Clinician: Referring Physician: Caryl Bis, ERIC Treating Physician/Extender: Ahliyah Mcconnell in Treatment: 4 Education Assessment Education Provided To: Patient Education Topics Provided Wound/Skin Impairment: Handouts: Other: wound care s ordered Methods: Explain/Verbal Responses: State content correctly Electronic Signature(s) Signed: 05/19/2019 4:43:58 PM By: Montey Hora Entered By: Montey Hora on 05/19/2019 10:48:30 Amy Mcconnell ([id number]) -------------------------------------------------------------------------------- Wound Assessment Details Patient Name: Amy Mcconnell Date of Service: 05/19/2019 10:30 AM Medical Record Number: [id number] Patient Account Number: [id number] Date of Birth/Sex: 1950/11/26 (67 y.o. F) Treating RN: Army Melia Primary Care Roizy Harold: Caryl Bis, ERIC Other Clinician: Referring Anna-Marie Coller: Caryl Bis, ERIC Treating Sharetha Newson/Extender: Amy Mcconnell in Treatment: 4 Wound Status Wound Number: 1 Primary Etiology: Trauma, Other Wound Location: Left Head - frontal Wound Status: Open Wounding Event: Trauma Comorbid History: Hypertension Date Acquired: 04/08/2019 Weeks Of Treatment: 4 Clustered Wound: No Photos Wound Measurements Length: (cm) 0.1 Width: (cm) 0.1 Depth: (cm) 0.1 Area: (cm) 0.008 Volume: (  cm) 0.001 % Reduction in Area: 50% % Reduction in Volume: 83.3% Epithelialization: None Tunneling: No Undermining: No Wound Description Full Thickness Without Exposed Support Classification: Structures Wound Margin: Well  defined, not attached Exudate Medium Amount: Exudate Type: Serosanguineous Exudate Color: red, brown Foul Odor After Cleansing: No Slough/Fibrino No Wound Bed Granulation Amount: Small (1-33%) Exposed Structure Granulation Quality: Pink Fascia Exposed: No Necrotic Amount: None Present (0%) Fat Layer (Subcutaneous Tissue) Exposed: Yes Tendon Exposed: No Muscle Exposed: No Joint Exposed: No Bone Exposed: No LESLEYANN, FICHTER ([id number]) Treatment Notes Wound #1 (Left Head - frontal) Notes Leave open to air Electronic Signature(s) Signed: 05/19/2019 3:04:02 PM By: Army Melia Entered By: Army Melia on 05/19/2019 10:34:59 Amy Mcconnell ([id number]) -------------------------------------------------------------------------------- Vitals Details Patient Name: Amy Mcconnell Date of Service: 05/19/2019 10:30 AM Medical Record Number: [id number] Patient Account Number: [id number] Date of Birth/Sex: 01-12-1951 (67 y.o. F) Treating RN: Army Melia Primary Care Merita Hawks: Caryl Bis, ERIC Other Clinician: Referring Eligah Anello: Caryl Bis, ERIC Treating Destyn Schuyler/Extender: Ginnie Mcconnell in Treatment: 4 Vital Signs Time Taken: 10:33 Temperature (F): 98.3 Height (in): 67 Pulse (bpm): 64 Weight (lbs): 142 Respiratory Rate (breaths/min): 16 Body Mass Index (BMI): 22.2 Blood Pressure (mmHg): 147/74 Reference Range: 80 - 120 mg / dl Electronic Signature(s) Signed: 05/19/2019 3:04:02 PM By: Army Melia Entered By: Army Melia on 05/19/2019 10:33:30

## 2019-05-20 NOTE — Progress Notes (Signed)
[name], Mcconnell ([id number]) Visit Report for 05/19/2019 HPI Details Patient Name: Amy Mcconnell, Amy Mcconnell Date of Service: 05/19/2019 10:30 AM Medical Record Number: [id number] Patient Account Number: [id number] Date of Birth/Sex: Mar 03, 1951 (68 y.o. F) Treating RN: Montey Hora Primary Care Provider: Caryl Bis, ERIC Other Clinician: Referring Provider: Caryl Bis, ERIC Treating Provider/Extender: Quinetta Gust in Treatment: 4 History of Present Illness HPI Description: 04/20/19 on evaluation today patient presents for initial evaluation our clinic concerning the laceration that she has extending from her left temple region across the upper portion of her forehead into the scalp into the posterior portion of her head. This occurred secondary to a fall which occurred on April 08, 2019. Subsequently she was seen in emergency department where this was cleaned and staple close that seems to have done very well for her. Fortunately there's no signs of active infection at this time. There does not appear to be signs of significant issue other than the fact that the blood collection which is around the left temple and Henrene Pastor orbital region has begun to leak out of a small opening in the wound at the temple location which is caused a significant drainage unfortunately. It does appear that she is very close to the time when we need to move the sutures staples but I would like to actually get this likely just a few more days I will probably bring her back on Friday in order to undertake this. No fevers, chills, nausea, or vomiting noted at this time. 04/24/19 on evaluation today patient actually appears to be doing better in regard to her scalp wound in the region over the left for head and. Orbital area. She's not having any drainage like what she did when I saw her back on Monday fortunately this is doing much better. I do think the staples are ready to come out at this point. Overall there's no  signs of active infection currently. 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 Nonetheless I'm not sure if this is going to be something that even is reversible or not she does want to know who she could see to try to see if anything could be done in that regard. 05/01/19 on evaluation today patient actually appears to be doing quite well in regard to the majority of her scalp laceration region. With that being said she had one area right on the left temple where she appeared to have almost what appeared to be a blood blister underneath. This was On the top. It was somewhat loose and while attempting to look at this to see how it was attached it actually broke loose and started bleeding as it was a very superficial artery at the site that caused issues. Subsequently we did have to manages to get everything stopped as far as the bleeding was concerned. Fortunately there's no signs of active infection at this time. 05/04/19 patient presents today for follow-up evaluation after I saw her at the end of last week. She had a blood blister over the last portion of the scalp wound at the temple on the left that had not completely healed. In fact this calls quite a bit of bleeding when it inadvertently popped off as well. Fortunately that all seems to be doing much better today. She is doing quite well and overall there is no bleeding and  no evidence that this is threatening to do so. 05/19/19-Patient presents today after being to the emergency room on Sunday when she had spontaneous bleeding from the left edge of the scalp wound over the left temple, this was sutured at her request in the emergency room with 3 nonabsorbable sutures. Since then patient has been keeping the wound clean not putting any ointment on it and actually feels this is getting  better. Electronic Signature(s) Signed: 05/19/2019 10:46:28 AM By: Tobi Bastos Entered By: Tobi Bastos on 05/19/2019 10:46:27 Amy Mcconnell ([id number]) -------------------------------------------------------------------------------- Physical Exam Details Patient Name: Amy Mcconnell Date of Service: 05/19/2019 10:30 AM Medical Record Number: [id number] Patient Account Number: [id number] Date of Birth/Sex: 1951-10-28 (68 y.o. F) Treating RN: Montey Hora Primary Care Provider: Caryl Bis, ERIC Other Clinician: Referring Provider: Caryl Bis, ERIC Treating Provider/Extender: Nastassja Gust in Treatment: 4 Constitutional alert and oriented x 3. sitting or standing blood pressure is within target range for patient.. supine blood pressure is within target range for patient.. pulse regular and within target range for patient.Marland Kitchen respirations regular, non-labored and within target range for patient.Marland Kitchen temperature within target range for patient.. . . Well-nourished and well-hydrated in no acute distress. Notes The left temple edge of the wound that had dehisced due to bleeding with 3 sutures now and coapted edges with no fluid collection or induration the area around the wound now looks clean And dry Electronic Signature(s) Signed: 05/19/2019 10:47:25 AM By: Tobi Bastos Entered By: Tobi Bastos on 05/19/2019 10:47:25 Amy Mcconnell ([id number]) -------------------------------------------------------------------------------- Physician Orders Details Patient Name: Amy Mcconnell Date of Service: 05/19/2019 10:30 AM Medical Record Number: [id number] Patient Account Number: [id number] Date of Birth/Sex: 1951/04/30 (68 y.o. F) Treating RN: Montey Hora Primary Care Provider: Caryl Bis, ERIC Other Clinician: Referring Provider: Caryl Bis, ERIC Treating Provider/Extender: Lorriann Gust in Treatment: 4 Verbal / Phone Orders: No Diagnosis  Coding Wound Cleansing Wound #1 Left Head - frontal o May Shower, gently pat wound dry prior to applying new dressing. - Please use baby shampoo when washing the area and please do not scratch Primary Wound Dressing Wound #1 Left Head - frontal o Other: - Leave open to air, wash carefully Follow-up Appointments Wound #1 Left Head - frontal o Return Appointment in 1 week. - next Friday 05/29/19 Electronic Signature(s) Signed: 05/19/2019 4:11:29 PM By: Tobi Bastos Signed: 05/19/2019 4:43:58 PM By: Montey Hora Entered By: Montey Hora on 05/19/2019 10:44:57 Amy Mcconnell ([id number]) -------------------------------------------------------------------------------- Progress Note Details Patient Name: Amy Mcconnell Date of Service: 05/19/2019 10:30 AM Medical Record Number: [id number] Patient Account Number: [id number] Date of Birth/Sex: [birth year] (67 y.o. F) Treating RN: Montey Hora Primary Care Provider: Caryl Bis, ERIC Other Clinician: Referring Provider: Caryl Bis, ERIC Treating Provider/Extender: Madalen Gust in Treatment: 4 Subjective History of Present Illness (HPI) 04/20/19 on evaluation today patient presents for initial evaluation our clinic concerning the laceration that she has extending from her left temple region across the upper portion of her forehead into the scalp into the posterior portion of her head. This occurred secondary to a fall which occurred on April 08, 2019. Subsequently she was seen in emergency department where this was cleaned and staple close that seems to have done very well for her. Fortunately there's no signs of active infection at this time. There does not appear to be signs of significant issue other than the fact that the blood collection which is around the left temple and Henrene Pastor orbital region has begun to leak out of a small  opening in the wound at the temple location which is caused a significant drainage  unfortunately. It does appear that she is very close to the time when we need to move the sutures staples but I would like to actually get this likely just a few more days I will probably bring her back on Friday in order to undertake this. No fevers, chills, nausea, or vomiting noted at this time. 04/24/19 on evaluation today patient actually appears to be doing better in regard to her scalp wound in the region over the left for head and. Orbital area. She's not having any drainage like what she did when I saw her back on Monday fortunately this is doing much better. I do think the staples are ready to come out at this point. Overall there's no signs of active infection currently. 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 Nonetheless I'm not sure if this is going to be something that even is reversible or not she does want to know who she could see to try to see if anything could be done in that regard. 05/01/19 on evaluation today patient actually appears to be doing quite well in regard to the majority of her scalp laceration region. With that being said she had one area right on the left temple where she appeared to have almost what appeared to be a blood blister underneath. This was On the top. It was somewhat loose and while attempting to look at this to see how it was attached it actually broke loose and started bleeding as it was a very superficial artery at the site that caused issues. Subsequently we did have to manages to get everything stopped as far as the bleeding was concerned. Fortunately there's no signs of active infection at this time. 05/04/19 patient presents today for follow-up evaluation after I saw her at the end of last week. She had a blood blister over the last portion of the scalp wound at the temple on the left that had  not completely healed. In fact this calls quite a bit of bleeding when it inadvertently popped off as well. Fortunately that all seems to be doing much better today. She is doing quite well and overall there is no bleeding and no evidence that this is threatening to do so. 05/19/19-Patient presents today after being to the emergency room on Sunday when she had spontaneous bleeding from the left edge of the scalp wound over the left temple, this was sutured at her request in the emergency room with 3 nonabsorbable sutures. Since then patient has been keeping the wound clean not putting any ointment on it and actually feels this is getting better. Objective Constitutional alert and oriented x 3. sitting or standing blood pressure is within target range for patient.. supine blood pressure is within PIXIE, BURGENER. ([id number]) target range for patient.. pulse regular and within target range for patient.Marland Kitchen respirations regular, non-labored and within target range for patient.Marland Kitchen temperature within target range for patient.. Well-nourished and well-hydrated in no acute distress. Vitals Time Taken: 10:33 AM, Height: 67 in, Weight: 142 lbs, BMI: 22.2, Temperature: 98.3 F, Pulse: 64 bpm, Respiratory Rate: 16 breaths/min, Blood Pressure: 147/74 mmHg. General Notes: The left temple edge of the wound that had dehisced due to bleeding with 3 sutures now and coapted edges with no  fluid collection or induration the area around the wound now looks clean And dry Integumentary (Hair, Skin) Wound #1 status is Open. Original cause of wound was Trauma. The wound is located on the Left Head - frontal. The wound measures 0.1cm length x 0.1cm width x 0.1cm depth; 0.008cm^2 area and 0.001cm^3 volume. There is Fat Layer (Subcutaneous Tissue) Exposed exposed. There is no tunneling or undermining noted. There is a medium amount of serosanguineous drainage noted. The wound margin is well defined and not attached to the  wound base. There is small (1-33%) pink granulation within the wound bed. There is no necrotic tissue within the wound bed. 1. Sutured wound that had dehisced, with intact surrounding skin, sutures can be removed next visit 2. Continue local care to keep the area clean and dry Plan Wound Cleansing: Wound #1 Left Head - frontal: May Shower, gently pat wound dry prior to applying new dressing. - Please use baby shampoo when washing the area and please do not scratch Primary Wound Dressing: Wound #1 Left Head - frontal: Other: - Leave open to air, wash carefully Follow-up Appointments: Wound #1 Left Head - frontal: Return Appointment in 1 week. - next Friday 05/29/19 Electronic Signature(s) Signed: 05/19/2019 10:48:13 AM By: Tobi Bastos Entered By: Tobi Bastos on 05/19/2019 10:48:12 Amy Mcconnell ([id number]) -------------------------------------------------------------------------------- [place] Details Patient Name: Amy Mcconnell Date of Service: 05/19/2019 Medical Record Number: [id number] Patient Account Number: [id number] Date of Birth/Sex: Oct 17, 1951 (67 y.o. F) Treating RN: Montey Hora Primary Care Provider: Caryl Bis, ERIC Other Clinician: Referring Provider: Caryl Bis, ERIC Treating Provider/Extender: Betzayda Gust in Treatment: 4 Diagnosis Coding ICD-10 Codes Code Description S01.80XA Unspecified open wound of other part of head, initial encounter I10 Essential (primary) hypertension E03.9 Hypothyroidism, unspecified F31.89 Other bipolar disorder Facility Procedures CPT4 Code: [id number] Description: 99213 - WOUND CARE VISIT-LEV 3 EST PT Modifier: Quantity: 1 Physician Procedures CPT4 Code: 4268341 Description: 96222 - WC PHYS LEVEL 3 - EST PT ICD-10 Diagnosis Description S01.80XA Unspecified open wound of other part of head, initial enc Modifier: ounter Quantity: 1 Electronic Signature(s) Signed: 05/19/2019 10:48:30 AM By: Tobi Bastos Entered By: Tobi Bastos on 05/19/2019 10:48:29

## 2019-05-21 ENCOUNTER — Telehealth: Payer: Self-pay

## 2019-05-21 NOTE — Telephone Encounter (Signed)
Pt taking atorvastatin  

## 2019-05-22 ENCOUNTER — Telehealth: Payer: Self-pay | Admitting: *Deleted

## 2019-05-22 ENCOUNTER — Telehealth: Payer: Self-pay | Admitting: Lab

## 2019-05-22 DIAGNOSIS — S0101XD Laceration without foreign body of scalp, subsequent encounter: Secondary | ICD-10-CM

## 2019-05-22 NOTE — Telephone Encounter (Signed)
I will send this to Lenna Sciara so that she can accomplish the prior authorization.  I do not believe we ever received anything asking for prior authorization for her physical therapy.  I can refer her again to the wound center though I was previously informed that she would not need repeated referrals to the wound center as she has been referred previously.

## 2019-05-22 NOTE — Telephone Encounter (Signed)
Returned Pt call in regards to her needing a prior authorization sent to [employer]
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
## 2019-05-22 NOTE — Telephone Encounter (Signed)
Copied from [place] [phone]. Topic: General - Other >> May 22, 2019  9:58 AM Rainey Pines A wrote: Patient needs a callback today in regards to getting authorization for PT  on her neck,shoulders,and foot for [facility]. Patient is scheduled for an appointment on Monday that she cannot attend because prior authorization wasn't done.

## 2019-05-25 ENCOUNTER — Ambulatory Visit: Payer: Medicare HMO

## 2019-05-27 ENCOUNTER — Ambulatory Visit: Payer: Medicare HMO

## 2019-05-28 ENCOUNTER — Telehealth: Payer: Self-pay

## 2019-05-28 NOTE — Telephone Encounter (Signed)
Copied from [place] [phone]. Topic: General - Other >> May 28, 2019  9:57 AM Rainey Pines A wrote: Patient is returning [facility] call and would like a callback

## 2019-05-28 NOTE — Telephone Encounter (Signed)
No other referral is required for the wound clinic. She has already been scheduled for 7/24. I am not sure why [employer] is requiring prior authorization for her PT. Unless it is new, I've never had to do it before. I will contact [employer] to see what I need to do.

## 2019-05-28 NOTE — Telephone Encounter (Signed)
Spoke to Santiago Glad at [employer] (who prior authorizations go through for PT). The ordering providers office does not do the prior authorization for this. The treating therapy office is responsible. That office will need to call [phone]. I will contact them with the phone number to get this done. Call ref# [id number]

## 2019-05-28 NOTE — Telephone Encounter (Signed)
Spoke to [facility] PT. They state that they have let Amy Mcconnell know that she will need to come in first to be evaluated then the physical therapist will have to fill out a form to submit to [facility]. He can not fill it out until she is evaluated. They state that she refused to come in until [facility] authorizes it. I have lvm for her to rtc to me so I can advise her again regarding how to get this authorized.

## 2019-05-29 ENCOUNTER — Encounter: Payer: Medicare HMO | Admitting: Physician Assistant

## 2019-05-29 ENCOUNTER — Other Ambulatory Visit: Payer: Self-pay

## 2019-05-29 DIAGNOSIS — E039 Hypothyroidism, unspecified: Secondary | ICD-10-CM | POA: Diagnosis not present

## 2019-05-29 DIAGNOSIS — I10 Essential (primary) hypertension: Secondary | ICD-10-CM | POA: Diagnosis not present

## 2019-05-29 DIAGNOSIS — F319 Bipolar disorder, unspecified: Secondary | ICD-10-CM | POA: Diagnosis not present

## 2019-05-29 DIAGNOSIS — Z87891 Personal history of nicotine dependence: Secondary | ICD-10-CM | POA: Diagnosis not present

## 2019-05-29 DIAGNOSIS — S0101XA Laceration without foreign body of scalp, initial encounter: Secondary | ICD-10-CM | POA: Diagnosis not present

## 2019-05-29 NOTE — Progress Notes (Addendum)
Amy Mcconnell, Amy Mcconnell ([id number]) Visit Report for 05/29/2019 Arrival Information Details Patient Name: Amy Mcconnell, Amy Mcconnell Date of Service: 05/29/2019 9:00 AM Medical Record Number: [id number] Patient Account Number: [id number] Date of Birth/Sex: September 05, 1951 (67 y.o. F) Treating RN: Amy Mcconnell Primary Care Amy Mcconnell: Amy Mcconnell, Amy Mcconnell Other Clinician: Referring Amy Mcconnell: Amy Mcconnell, Amy Mcconnell Treating Amy Mcconnell/Extender: Amy Mcconnell, Amy Mcconnell Weeks in Treatment: 5 Visit Information History Since Last Visit Added or deleted any medications: No Patient Arrived: Ambulatory Any new allergies or adverse reactions: No Arrival Time: 08:55 Had a fall or experienced change in No Accompanied By: self activities of daily living that may affect Transfer Assistance: None risk of falls: Patient Identification Verified: Yes Signs or symptoms of abuse/neglect since last visito No Secondary Verification Process Completed: Yes Hospitalized since last visit: No Has Dressing in Place as Prescribed: Yes Pain Present Now: No Electronic Signature(s) Signed: 05/29/2019 3:58:20 PM By: Amy Mcconnell Entered By: Amy Mcconnell on 05/29/2019 08:55:31 Amy Mcconnell ([id number]) -------------------------------------------------------------------------------- Clinic Level of Care Assessment Details Patient Name: Amy Mcconnell Date of Service: 05/29/2019 9:00 AM Medical Record Number: [id number] Patient Account Number: [id number] Date of Birth/Sex: Feb 05, 1951 (67 y.o. F) Treating RN: Amy Mcconnell Primary Care Amy Mcconnell: Amy Mcconnell, Amy Mcconnell Other Clinician: Referring Amy Mcconnell: Amy Mcconnell, Amy Mcconnell Treating Amy Mcconnell/Extender: Amy Mcconnell, Amy Mcconnell Weeks in Treatment: 5 Clinic Level of Care Assessment Items TOOL 4 Quantity Score []  - Use when only an EandM is performed on FOLLOW-UP visit 0 ASSESSMENTS - Nursing Assessment / Reassessment X - Reassessment of Co-morbidities (includes updates in patient status) 1 10 X- 1  5 Reassessment of Adherence to Treatment Plan ASSESSMENTS - Wound and Skin Assessment / Reassessment X - Simple Wound Assessment / Reassessment - one wound 1 5 []  - 0 Complex Wound Assessment / Reassessment - multiple wounds []  - 0 Dermatologic / Skin Assessment (not related to wound area) ASSESSMENTS - Focused Assessment []  - Circumferential Edema Measurements - multi extremities 0 []  - 0 Nutritional Assessment / Counseling / Intervention []  - 0 Lower Extremity Assessment (monofilament, tuning fork, pulses) []  - 0 Peripheral Arterial Disease Assessment (using hand held doppler) ASSESSMENTS - Ostomy and/or Continence Assessment and Care []  - Incontinence Assessment and Management 0 []  - 0 Ostomy Care Assessment and Management (repouching, etc.) PROCESS - Coordination of Care X - Simple Patient / Family Education for ongoing care 1 15 []  - 0 Complex (extensive) Patient / Family Education for ongoing care X- 1 10 Staff obtains Programmer, systems, Records, Test Results / Process Orders []  - 0 Staff telephones HHA, Nursing Homes / Clarify orders / etc []  - 0 Routine Transfer to another Facility (non-emergent condition) []  - 0 Routine Hospital Admission (non-emergent condition) []  - 0 New Admissions / Biomedical engineer / Ordering NPWT, Apligraf, etc. []  - 0 Emergency Hospital Admission (emergent condition) X- 1 10 Simple Discharge Coordination Amy Mcconnell, Amy Mcconnell ([id number]) []  - 0 Complex (extensive) Discharge Coordination PROCESS - Special Needs []  - Pediatric / Minor Patient Management 0 []  - 0 Isolation Patient Management []  - 0 Hearing / Language / Visual special needs []  - 0 Assessment of Community assistance (transportation, D/C planning, etc.) []  - 0 Additional assistance / Altered mentation []  - 0 Support Surface(s) Assessment (bed, cushion, seat, etc.) INTERVENTIONS - Wound Cleansing / Measurement X - Simple Wound Cleansing - one wound 1 5 []  - 0 Complex Wound  Cleansing - multiple wounds X- 1 5 Wound Imaging (photographs - any number of wounds) []  - 0 Wound Tracing (instead of photographs) X- 1 5 Simple Wound  Measurement - one wound []  - 0 Complex Wound Measurement - multiple wounds INTERVENTIONS - Wound Dressings []  - Small Wound Dressing one or multiple wounds 0 []  - 0 Medium Wound Dressing one or multiple wounds []  - 0 Large Wound Dressing one or multiple wounds []  - 0 Application of Medications - topical []  - 0 Application of Medications - injection INTERVENTIONS - Miscellaneous []  - External ear exam 0 []  - 0 Specimen Collection (cultures, biopsies, blood, body fluids, etc.) []  - 0 Specimen(s) / Culture(s) sent or taken to Lab for analysis []  - 0 Patient Transfer (multiple staff / Civil Service fast streamer / Similar devices) X- 1 5 Simple Staple / Suture removal (25 or less) []  - 0 Complex Staple / Suture removal (26 or more) []  - 0 Hypo / Hyperglycemic Management (close monitor of Blood Glucose) []  - 0 Ankle / Brachial Index (ABI) - do not check if billed separately X- 1 5 Vital Signs Amy Mcconnell, Amy Mcconnell. ([id number]) Has the patient been seen at the hospital within the last three years: Yes Total Score: 80 Level Of Care: ____ Electronic Signature(s) Signed: 05/29/2019 4:09:36 PM By: Amy Mcconnell Entered By: Amy Mcconnell on 05/29/2019 10:02:05 Amy Mcconnell ([id number]) -------------------------------------------------------------------------------- Encounter Discharge Information Details Patient Name: Amy Mcconnell Date of Service: 05/29/2019 9:00 AM Medical Record Number: [id number] Patient Account Number: [id number] Date of Birth/Sex: [birth year] (67 y.o. F) Treating RN: Amy Mcconnell Primary Care Ladeja Pelham: Amy Mcconnell, Amy Mcconnell Other Clinician: Referring Chesney Suares: Amy Mcconnell, Amy Mcconnell Treating Atarah Cadogan/Extender: Amy Mcconnell, Amy Mcconnell Weeks in Treatment: 5 Encounter Discharge Information Items Discharge Condition:  Stable Ambulatory Status: Ambulatory Discharge Destination: Home Transportation: Private Auto Accompanied By: self Schedule Follow-up Appointment: No Clinical Summary of Care: Electronic Signature(s) Signed: 05/29/2019 10:02:51 AM By: Amy Mcconnell Entered By: Amy Mcconnell on 05/29/2019 10:02:51 Amy Mcconnell ([id number]) -------------------------------------------------------------------------------- Lower Extremity Assessment Details Patient Name: Amy Mcconnell Date of Service: 05/29/2019 9:00 AM Medical Record Number: [id number] Patient Account Number: [id number] Date of Birth/Sex: Nov 30, 1950 (67 y.o. F) Treating RN: Amy Mcconnell Primary Care Letrice Pollok: Amy Mcconnell, Amy Mcconnell Other Clinician: Referring Cheetara Hoge: Amy Mcconnell, Amy Mcconnell Treating Cheyanne Lamison/Extender: Amy Mcconnell, Amy Mcconnell Weeks in Treatment: 5 Electronic Signature(s) Signed: 05/29/2019 3:58:20 PM By: Amy Mcconnell Entered By: Amy Mcconnell on 05/29/2019 09:00:22 Amy Mcconnell ([id number]) -------------------------------------------------------------------------------- Multi Wound Chart Details Patient Name: Amy Mcconnell Date of Service: 05/29/2019 9:00 AM Medical Record Number: [id number] Patient Account Number: [id number] Date of Birth/Sex: [birth year] (67 y.o. F) Treating RN: Amy Mcconnell Primary Care Bracken Moffa: Amy Mcconnell, Amy Mcconnell Other Clinician: Referring Elbia Paro: Amy Mcconnell, Amy Mcconnell Treating Brooklee Michelin/Extender: STONE III, Amy Mcconnell Weeks in Treatment: 5 Vital Signs Height(in): 3 Pulse(bpm): 89 Weight(lbs): 142 Blood Pressure(mmHg): 123/75 Body Mass Index(BMI): 22 Temperature(F): 97.9 Respiratory Rate 18 (breaths/min): Photos: [N/A:N/A] Wound Location: Left Head - frontal N/A N/A Wounding Event: Trauma N/A N/A Primary Etiology: Trauma, Other N/A N/A Comorbid History: Hypertension N/A N/A Date Acquired: 04/08/2019 N/A N/A Weeks of Treatment: 5 N/A N/A Wound Status: Open N/A N/A Measurements L x W x  D 0.1x0.1x0.1 N/A N/A (cm) Area (cm) : 0.008 N/A N/A Volume (cm) : 0.001 N/A N/A % Reduction in Area: 50.00% N/A N/A % Reduction in Volume: 83.30% N/A N/A Classification: Full Thickness Without N/A N/A Exposed Support Structures Exudate Amount: Medium N/A N/A Exudate Type: Serosanguineous N/A N/A Exudate Color: red, brown N/A N/A Wound Margin: Well defined, not attached N/A N/A Granulation Amount: Small (1-33%) N/A N/A Granulation Quality: Pink N/A N/A Necrotic Amount: None Present (0%) N/A N/A Exposed Structures: Fat Layer (Subcutaneous N/A N/A Tissue)  Exposed: Yes Fascia: No Tendon: No Muscle: No Joint: No Bone: No Amy Mcconnell, Amy Mcconnell ([id number]) Epithelialization: None N/A N/A Treatment Notes Electronic Signature(s) Signed: 05/29/2019 4:09:36 PM By: Amy Mcconnell Entered By: Amy Mcconnell on 05/29/2019 09:27:50 Amy Mcconnell ([id number]) -------------------------------------------------------------------------------- Multi-Disciplinary Care Plan Details Patient Name: Amy Mcconnell Date of Service: 05/29/2019 9:00 AM Medical Record Number: [id number] Patient Account Number: [id number] Date of Birth/Sex: Mar 15, 1951 (67 y.o. F) Treating RN: Amy Mcconnell Primary Care Falyn Rubel: Amy Mcconnell, Amy Mcconnell Other Clinician: Referring Lyden Redner: Amy Mcconnell, Amy Mcconnell Treating Khamron Gellert/Extender: Sharalyn Ink in Treatment: 5 Active Inactive Electronic Signature(s) Signed: 06/01/2019 8:51:21 AM By: Gretta Cool, BSN, RN, CWS, Kim RN, BSN Signed: 06/01/2019 4:17:28 PM By: Amy Mcconnell Previous Signature: 05/29/2019 4:09:36 PM Version By: Amy Mcconnell Entered By: Gretta Cool BSN, RN, CWS, Kim on 06/01/2019 08:51:19 Amy Mcconnell, Amy Mcconnell ([id number]) -------------------------------------------------------------------------------- Pain Assessment Details Patient Name: Amy Mcconnell Date of Service: 05/29/2019 9:00 AM Medical Record Number: [id number] Patient Account Number:  [id number] Date of Birth/Sex: 15-Jun-1951 (67 y.o. F) Treating RN: Amy Mcconnell Primary Care Yazir Koerber: Amy Mcconnell, Amy Mcconnell Other Clinician: Referring Darek Eifler: Amy Mcconnell, Amy Mcconnell Treating Merelyn Klump/Extender: Amy Mcconnell, Amy Mcconnell Weeks in Treatment: 5 Active Problems Location of Pain Severity and Description of Pain Patient Has Paino No Site Locations Pain Management and Medication Current Pain Management: Electronic Signature(s) Signed: 05/29/2019 3:58:20 PM By: Amy Mcconnell Entered By: Amy Mcconnell on 05/29/2019 08:55:39 Amy Mcconnell ([id number]) -------------------------------------------------------------------------------- Patient/Caregiver Education Details Patient Name: Amy Mcconnell Date of Service: 05/29/2019 9:00 AM Medical Record Number: [id number] Patient Account Number: [id number] Date of Birth/Gender: Sep 15, 1951 (67 y.o. F) Treating RN: Amy Mcconnell Primary Care Physician: Amy Mcconnell, Amy Mcconnell Other Clinician: Referring Physician: Caryl Mcconnell, Amy Mcconnell Treating Physician/Extender: Sharalyn Ink in Treatment: 5 Education Assessment Education Provided To: Patient Education Topics Provided Basic Hygiene: Handouts: Other: care of newly healed wound site Methods: Explain/Verbal Responses: State content correctly Electronic Signature(s) Signed: 05/29/2019 4:09:36 PM By: Amy Mcconnell Entered By: Amy Mcconnell on 05/29/2019 10:02:35 Amy Mcconnell ([id number]) -------------------------------------------------------------------------------- Wound Assessment Details Patient Name: Amy Mcconnell Date of Service: 05/29/2019 9:00 AM Medical Record Number: [id number] Patient Account Number: [id number] Date of Birth/Sex: [birth year] (67 y.o. F) Treating RN: Amy Mcconnell Primary Care Tameka Hoiland: Amy Mcconnell, Amy Mcconnell Other Clinician: Referring Tanysha Quant: Amy Mcconnell, Amy Mcconnell Treating Truong Delcastillo/Extender: Amy Mcconnell, Amy Mcconnell Weeks in Treatment: 5 Wound Status Wound Number:  1 Primary Etiology: Trauma, Other Wound Location: Left Head - frontal Wound Status: Healed - Epithelialized Wounding Event: Trauma Comorbid History: Hypertension Date Acquired: 04/08/2019 Weeks Of Treatment: 5 Clustered Wound: No Photos Wound Measurements Length: (cm) 0 % Reduct Width: (cm) 0 % Reduct Depth: (cm) 0 Epitheli Area: (cm) 0 Tunneli Volume: (cm) 0 Undermi ion in Area: 100% ion in Volume: 100% alization: None ng: No ning: No Wound Description Full Thickness Without Exposed Support Foul Odo Classification: Structures Slough/F Wound Margin: Well defined, not attached Exudate Medium Amount: Exudate Type: Serosanguineous Exudate Color: red, brown r After Cleansing: No ibrino No Wound Bed Granulation Amount: Small (1-33%) Exposed Structure Granulation Quality: Pink Fascia Exposed: No Necrotic Amount: None Present (0%) Fat Layer (Subcutaneous Tissue) Exposed: Yes Tendon Exposed: No Muscle Exposed: No Joint Exposed: No Bone Exposed: No Amy Mcconnell, Amy Mcconnell ([id number]) Electronic Signature(s) Signed: 05/29/2019 4:09:36 PM By: Amy Mcconnell Entered By: Amy Mcconnell on 05/29/2019 09:30:58 Amy Mcconnell ([id number]) -------------------------------------------------------------------------------- [place] Details Patient Name: Amy Mcconnell Date of Service: 05/29/2019 9:00 AM Medical Record Number: [id number] Patient Account Number: [id number] Date of Birth/Sex: [birth year] (67 y.o. F) Treating RN: Amy Mcconnell Primary Care  Kemyah Buser: Amy Mcconnell, Amy Mcconnell Other Clinician: Referring Amaliya Whitelaw: Amy Mcconnell, Amy Mcconnell Treating Evalisse Prajapati/Extender: Amy Mcconnell, Amy Mcconnell Weeks in Treatment: 5 Vital Signs Time Taken: 08:56 Temperature (F): 97.9 Height (in): 67 Pulse (bpm): 57 Weight (lbs): 142 Respiratory Rate (breaths/min): 18 Body Mass Index (BMI): 22.2 Blood Pressure (mmHg): 123/75 Reference Range: 80 - 120 mg / dl Electronic Signature(s) Signed: 05/29/2019  3:58:20 PM By: Amy Mcconnell Entered By: Amy Mcconnell on 05/29/2019 08:57:18

## 2019-05-29 NOTE — Progress Notes (Signed)
Amy, Mcconnell ([id number]) Visit Report for 05/29/2019 Chief Complaint Document Details Patient Name: Amy Mcconnell, Amy Mcconnell Date of Service: 05/29/2019 9:00 AM Medical Record Number: [id number] Patient Account Number: [id number] Date of Birth/Sex: [birth year] (68 y.o. F) Treating RN: Montey Hora Primary Care Provider: Caryl Bis, ERIC Other Clinician: Referring Provider: Caryl Bis, ERIC Treating Provider/Extender: Melburn Hake, HOYT Weeks in Treatment: 5 Information Obtained from: Patient Chief Complaint Scalp laceration Electronic Signature(s) Signed: 05/29/2019 4:51:26 PM By: Worthy Keeler PA-C Entered By: Worthy Keeler on 05/29/2019 09:22:03 Amy Mcconnell ([id number]) -------------------------------------------------------------------------------- HPI Details Patient Name: Amy Mcconnell Date of Service: 05/29/2019 9:00 AM Medical Record Number: [id number] Patient Account Number: [id number] Date of Birth/Sex: 1951/03/04 (68 y.o. F) Treating RN: Montey Hora Primary Care Provider: Caryl Bis, ERIC Other Clinician: Referring Provider: Caryl Bis, ERIC Treating Provider/Extender: Melburn Hake, HOYT Weeks in Treatment: 5 History of Present Illness HPI Description: 04/20/19 on evaluation today patient presents for initial evaluation our clinic concerning the laceration that she has extending from her left temple region across the upper portion of her forehead into the scalp into the posterior portion of her head. This occurred secondary to a fall which occurred on April 08, 2019. Subsequently she was seen in emergency department where this was cleaned and staple close that seems to have done very well for her. Fortunately there's no signs of active infection at this time. There does not appear to be signs of significant issue other than the fact that the blood collection which is around the left temple and Henrene Pastor orbital region has begun to leak out of a small opening in the wound  at the temple location which is caused a significant drainage unfortunately. It does appear that she is very close to the time when we need to move the sutures staples but I would like to actually get this likely just a few more days I will probably bring her back on Friday in order to undertake this. No fevers, chills, nausea, or vomiting noted at this time. 04/24/19 on evaluation today patient actually appears to be doing better in regard to her scalp wound in the region over the left for head and. Orbital area. She's not having any drainage like what she did when I saw her back on Monday fortunately this is doing much better. I do think the staples are ready to come out at this point. Overall there's no signs of active infection currently. 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 Nonetheless I'm not sure if this is going to be something that even is reversible or not she does want to know who she could see to try to see if anything could be done in that regard. 05/01/19 on evaluation today patient actually appears to be doing quite well in regard to the majority of her scalp laceration region. With that being said she had one area right on the left temple where she appeared to have almost what appeared to be a blood blister underneath. This was On the top. It was somewhat loose and while attempting to look at this to see how it was attached it actually broke loose and started bleeding as it was a very superficial artery at the site that caused issues. Subsequently we did have to manages to get everything stopped as far as the bleeding was concerned. Fortunately there's  no signs of active infection at this time. 05/04/19 patient presents today for follow-up evaluation after I saw her at the end of last week. She had a blood blister over  the last portion of the scalp wound at the temple on the left that had not completely healed. In fact this calls quite a bit of bleeding when it inadvertently popped off as well. Fortunately that all seems to be doing much better today. She is doing quite well and overall there is no bleeding and no evidence that this is threatening to do so. 05/19/19-Patient presents today after being to the emergency room on Sunday when she had spontaneous bleeding from the left edge of the scalp wound over the left temple, this was sutured at her request in the emergency room with 3 nonabsorbable sutures. Since then patient has been keeping the wound clean not putting any ointment on it and actually feels this is getting better. 05/29/19 on evaluation today patient presents for follow-up. She was last seen on 05/19/19 at which point she just been the day before to receive sutures in the form of location where she continued have issues with bleeding. Fortunately she seems to be doing excellent today the sutures have done a great job actually put surgical glue over top of this which I did not realize to begin with and wasn't attempted to improve the sutures but I realized they were actually glued into the surgical glue. Nonetheless she seems to feel it very well and I think at this point it is definitely time to get the sutures out. Electronic Signature(s) Signed: 05/29/2019 4:51:26 PM By: Worthy Keeler PA-C Entered By: Worthy Keeler on 05/29/2019 09:34:33 Amy Mcconnell ([id number]) -------------------------------------------------------------------------------- Physical Exam Details Patient Name: Amy Mcconnell Date of Service: 05/29/2019 9:00 AM Medical Record Number: [id number] Patient Account Number: [id number] Date of Birth/Sex: [birth year] (68 y.o. F) Treating RN: Montey Hora Primary Care Provider: Caryl Bis, ERIC Other Clinician: Referring Provider: Caryl Bis, ERIC Treating  Provider/Extender: STONE III, HOYT Weeks in Treatment: 5 Constitutional Well-nourished and well-hydrated in no acute distress. Respiratory normal breathing without difficulty. Psychiatric this patient is able to make decisions and demonstrates good insight into disease process. Alert and Oriented x 3. pleasant and cooperative. Notes Patient's wound bed currently did require a careful suture removal I had to lift up the surgical glue where the sutures were glued into this and as I lifted it up I snipped one side of the suture in each case. There were three total. I was then able to continue to pull the glue off and once removed this appears to be completely healed underneath which is excellent news. The patient is extremely pleased to hear this. Electronic Signature(s) Signed: 05/29/2019 4:51:26 PM By: Worthy Keeler PA-C Entered By: Worthy Keeler on 05/29/2019 09:35:05 Amy Mcconnell ([id number]) -------------------------------------------------------------------------------- Physician Orders Details Patient Name: Amy Mcconnell Date of Service: 05/29/2019 9:00 AM Medical Record Number: [id number] Patient Account Number: [id number] Date of Birth/Sex: 02-10-1951 (68 y.o. F) Treating RN: Montey Hora Primary Care Provider: Caryl Bis, ERIC Other Clinician: Referring Provider: Caryl Bis, ERIC Treating Provider/Extender: Melburn Hake, HOYT Weeks in Treatment: 5 Verbal / Phone Orders: No Diagnosis Coding ICD-10 Coding Code Description S01.80XA Unspecified open wound of other part of head, initial encounter I10 Essential (primary) hypertension E03.9 Hypothyroidism, unspecified F31.89 Other bipolar disorder Discharge From [facility] o Discharge from [place] may continue for 2 more weeks to verify skin integrity and then discharge. Electronic  Signature(s) Signed: 05/29/2019 9:42:14 AM By: Montey Hora Signed: 05/29/2019 4:51:26 PM By: Worthy Keeler  PA-C Entered By: Montey Hora on 05/29/2019 09:42:13 Amy Mcconnell ([id number]) -------------------------------------------------------------------------------- Problem List Details Patient Name: Amy Mcconnell Date of Service: 05/29/2019 9:00 AM Medical Record Number: [id number] Patient Account Number: [id number] Date of Birth/Sex: [birth year] (68 y.o. F) Treating RN: Montey Hora Primary Care Provider: Caryl Bis, ERIC Other Clinician: Referring Provider: Caryl Bis, ERIC Treating Provider/Extender: Melburn Hake, HOYT Weeks in Treatment: 5 Active Problems ICD-10 Evaluated Encounter Code Description Active Date Today Diagnosis S01.80XA Unspecified open wound of other part of head, initial 04/20/2019 No Yes encounter [place] (primary) hypertension 04/20/2019 No Yes E03.9 Hypothyroidism, unspecified 04/20/2019 No Yes F31.89 Other bipolar disorder 04/20/2019 No Yes Inactive Problems Resolved Problems Electronic Signature(s) Signed: 05/29/2019 4:51:26 PM By: Worthy Keeler PA-C Entered By: Worthy Keeler on 05/29/2019 09:21:53 Amy Mcconnell ([id number]) -------------------------------------------------------------------------------- Progress Note Details Patient Name: Amy Mcconnell Date of Service: 05/29/2019 9:00 AM Medical Record Number: [id number] Patient Account Number: [id number] Date of Birth/Sex: 11/08/1950 (68 y.o. F) Treating RN: Montey Hora Primary Care Provider: Caryl Bis, ERIC Other Clinician: Referring Provider: Caryl Bis, ERIC Treating Provider/Extender: Melburn Hake, HOYT Weeks in Treatment: 5 Subjective Chief Complaint Information obtained from Patient Scalp laceration History of Present Illness (HPI) 04/20/19 on evaluation today patient presents for initial evaluation our clinic concerning the laceration that she has extending from her left temple region across the upper portion of her forehead into the scalp into the posterior portion of  her head. This occurred secondary to a fall which occurred on April 08, 2019. Subsequently she was seen in emergency department where this was cleaned and staple close that seems to have done very well for her. Fortunately there's no signs of active infection at this time. There does not appear to be signs of significant issue other than the fact that the blood collection which is around the left temple and Henrene Pastor orbital region has begun to leak out of a small opening in the wound at the temple location which is caused a significant drainage unfortunately. It does appear that she is very close to the time when we need to move the sutures staples but I would like to actually get this likely just a few more days I will probably bring her back on Friday in order to undertake this. No fevers, chills, nausea, or vomiting noted at this time. 04/24/19 on evaluation today patient actually appears to be doing better in regard to her scalp wound in the region over the left for head and. Orbital area. She's not having any drainage like what she did when I saw her back on Monday fortunately this is doing much better. I do think the staples are ready to come out at this point. Overall there's no signs of active infection currently. 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 Nonetheless I'm not sure if this is going to be something that even is reversible or not she does want to know who she could see to try to see if anything could be done in that regard. 05/01/19 on evaluation today patient actually appears to be doing quite well in regard to the majority of her scalp laceration region. With that being said she had one area right on the left temple where she appeared to  have almost what appeared to be a blood blister underneath. This was On the top. It was  somewhat loose and while attempting to look at this to see how it was attached it actually broke loose and started bleeding as it was a very superficial artery at the site that caused issues. Subsequently we did have to manages to get everything stopped as far as the bleeding was concerned. Fortunately there's no signs of active infection at this time. 05/04/19 patient presents today for follow-up evaluation after I saw her at the end of last week. She had a blood blister over the last portion of the scalp wound at the temple on the left that had not completely healed. In fact this calls quite a bit of bleeding when it inadvertently popped off as well. Fortunately that all seems to be doing much better today. She is doing quite well and overall there is no bleeding and no evidence that this is threatening to do so. 05/19/19-Patient presents today after being to the emergency room on Sunday when she had spontaneous bleeding from the left edge of the scalp wound over the left temple, this was sutured at her request in the emergency room with 3 nonabsorbable sutures. Since then patient has been keeping the wound clean not putting any ointment on it and actually feels this is getting better. 05/29/19 on evaluation today patient presents for follow-up. She was last seen on 05/19/19 at which point she just been the day before to receive sutures in the form of location where she continued have issues with bleeding. Fortunately she seems to be doing excellent today the sutures have done a great job actually put surgical glue over top of this which I did not realize to begin with and wasn't attempted to improve the sutures but I realized they were actually glued into the surgical glue. Nonetheless she seems to feel it very well and I think at this point it is definitely time to get the sutures out. DAUNA, ZISKA ([id number]) Patient History Information obtained from Patient. Family History Heart Disease  - Siblings,Paternal Grandparents, Hypertension - Paternal Grandparents,Siblings, Thyroid Problems - Maternal Grandparents,Father,Siblings, No family history of Cancer, Diabetes, Kidney Disease, Lung Disease, Seizures, Stroke, Tuberculosis. Social History Former smoker - Quit 20+ years, Marital Status - Divorced, Alcohol Use - Never, Drug Use - No History, Caffeine Use - Daily. Medical History Cardiovascular Patient has history of Hypertension Denies history of Angina, Arrhythmia, Congestive Heart Failure, Coronary Artery Disease, Deep Vein Thrombosis, Hypotension, Myocardial Infarction, Peripheral Arterial Disease, Peripheral Venous Disease, Phlebitis, Vasculitis Endocrine Denies history of Type I Diabetes, Type II Diabetes Integumentary (Skin) Denies history of History of Burn, History of pressure wounds Medical And Surgical History Notes Psychiatric Bipolar Review of Systems (ROS) Constitutional Symptoms (General Health) Denies complaints or symptoms of Fatigue, Fever, Chills, Marked Weight Change. Respiratory Denies complaints or symptoms of Chronic or frequent coughs, Shortness of Breath. Cardiovascular Denies complaints or symptoms of Chest pain, LE edema. Psychiatric Denies complaints or symptoms of Anxiety, Claustrophobia. Objective Constitutional Well-nourished and well-hydrated in no acute distress. Vitals Time Taken: 8:56 AM, Height: 67 in, Weight: 142 lbs, BMI: 22.2, Temperature: 97.9 F, Pulse: 57 bpm, Respiratory Rate: 18 breaths/min, Blood Pressure: 123/75 mmHg. Respiratory normal breathing without difficulty. Psychiatric this patient is able to make decisions and demonstrates good insight into disease process. Alert and Oriented x 3. pleasant and cooperative. CONNI, KNIGHTON ([id number]) General Notes: Patient's wound bed currently did require a careful suture removal I  had to lift up the surgical glue where the sutures were glued into this and as I lifted it  up I snipped one side of the suture in each case. There were three total. I was then able to continue to pull the glue off and once removed this appears to be completely healed underneath which is excellent news. The patient is extremely pleased to hear this. Integumentary (Hair, Skin) Wound #1 status is Healed - Epithelialized. Original cause of wound was Trauma. The wound is located on the Left Head - frontal. The wound measures 0cm length x 0cm width x 0cm depth; 0cm^2 area and 0cm^3 volume. There is Fat Layer (Subcutaneous Tissue) Exposed exposed. There is no tunneling or undermining noted. There is a medium amount of serosanguineous drainage noted. The wound margin is well defined and not attached to the wound base. There is small (1-33%) pink granulation within the wound bed. There is no necrotic tissue within the wound bed. Assessment Active Problems ICD-10 Unspecified open wound of other part of head, initial encounter Essential (primary) hypertension Hypothyroidism, unspecified Other bipolar disorder Plan Discharge From [facility] Services: Discharge from [place] this point I'm gonna discontinue wound care services that she seems to be doing well. I would recommend using a little bit of some kind of cover dressing such as a Band-Aid for the next two weeks just into this area toughens up. Once it toughens up she should be okay to no longer need to do that any further. If anything changes worsens meantime she will contact the office and let me know. Please see above for specific wound care orders. We will see patient for re-evaluation in 1 week(s) here in the clinic. If anything worsens or changes patient will contact our office for additional recommendations. Electronic Signature(s) Signed: 05/29/2019 4:51:26 PM By: Worthy Keeler PA-C Entered By: Worthy Keeler on 05/29/2019 09:35:43 Amy Mcconnell  ([id number]) -------------------------------------------------------------------------------- ROS/PFSH Details Patient Name: Amy Mcconnell Date of Service: 05/29/2019 9:00 AM Medical Record Number: [id number] Patient Account Number: [id number] Date of Birth/Sex: 1951/07/17 (68 y.o. F) Treating RN: Montey Hora Primary Care Provider: Caryl Bis, ERIC Other Clinician: Referring Provider: Caryl Bis, ERIC Treating Provider/Extender: Melburn Hake, HOYT Weeks in Treatment: 5 Information Obtained From Patient Constitutional Symptoms (General Health) Complaints and Symptoms: Negative for: Fatigue; Fever; Chills; Marked Weight Change Respiratory Complaints and Symptoms: Negative for: Chronic or frequent coughs; Shortness of Breath Cardiovascular Complaints and Symptoms: Negative for: Chest pain; LE edema Medical History: Positive for: Hypertension Negative for: Angina; Arrhythmia; Congestive Heart Failure; Coronary Artery Disease; Deep Vein Thrombosis; Hypotension; Myocardial Infarction; Peripheral Arterial Disease; Peripheral Venous Disease; Phlebitis; Vasculitis Psychiatric Complaints and Symptoms: Negative for: Anxiety; Claustrophobia Medical History: Past Medical History Notes: Bipolar Endocrine Medical History: Negative for: Type I Diabetes; Type II Diabetes Integumentary (Skin) Medical History: Negative for: History of Burn; History of pressure wounds Immunizations Pneumococcal Vaccine: Received Pneumococcal Vaccination: No Implantable Devices None Family and Social History HALIEY, ROMBERG ([id number]) Cancer: No; Diabetes: No; Heart Disease: Yes - Siblings,Paternal Grandparents; Hypertension: Yes - Paternal Grandparents,Siblings; Kidney Disease: No; Lung Disease: No; Seizures: No; Stroke: No; Thyroid Problems: Yes - Maternal Grandparents,Father,Siblings; Tuberculosis: No; Former smoker - Quit 20+ years; Marital Status - Divorced; Alcohol Use: Never; Drug Use: No  History; Caffeine Use: Daily Physician Affirmation I have reviewed and agree with the above information. Electronic Signature(s) Signed: 05/29/2019 4:09:36 PM By: Montey Hora Signed: 05/29/2019 4:51:26 PM By: Worthy Keeler PA-C Entered By: Worthy Keeler  on 05/29/2019 09:34:48 BRITINY, DEFRAIN ([id number]) -------------------------------------------------------------------------------- SuperBill Details Patient Name: Amy Mcconnell Date of Service: 05/29/2019 Medical Record Number: [id number] Patient Account Number: [id number] Date of Birth/Sex: [birth year] (68 y.o. F) Treating RN: Montey Hora Primary Care Provider: Caryl Bis, ERIC Other Clinician: Referring Provider: Caryl Bis, ERIC Treating Provider/Extender: Melburn Hake, HOYT Weeks in Treatment: 5 Diagnosis Coding ICD-10 Codes Code Description S01.80XA Unspecified open wound of other part of head, initial encounter I10 Essential (primary) hypertension E03.9 Hypothyroidism, unspecified F31.89 Other bipolar disorder Facility Procedures CPT4 Code: [id number] Description: 99213 - WOUND CARE VISIT-LEV 3 EST PT Modifier: Quantity: 1 Physician Procedures CPT4 Code: [id number] Description: 27062 - WC PHYS LEVEL 3 - EST PT ICD-10 Diagnosis Description S01.80XA Unspecified open wound of other part of head, initial enc I10 Essential (primary) hypertension E03.9 Hypothyroidism, unspecified F31.89 Other bipolar disorder Modifier: ounter Quantity: 1 Electronic Signature(s) Signed: 05/29/2019 10:02:13 AM By: Montey Hora Signed: 05/29/2019 4:51:26 PM By: Worthy Keeler PA-C Entered By: Montey Hora on 05/29/2019 10:02:13

## 2019-06-01 ENCOUNTER — Ambulatory Visit: Payer: Medicare HMO

## 2019-06-01 ENCOUNTER — Ambulatory Visit: Payer: Medicare HMO | Attending: Family Medicine

## 2019-06-01 ENCOUNTER — Other Ambulatory Visit: Payer: Self-pay

## 2019-06-01 DIAGNOSIS — M6281 Muscle weakness (generalized): Secondary | ICD-10-CM | POA: Diagnosis not present

## 2019-06-01 DIAGNOSIS — R262 Difficulty in walking, not elsewhere classified: Secondary | ICD-10-CM

## 2019-06-01 DIAGNOSIS — M25572 Pain in left ankle and joints of left foot: Secondary | ICD-10-CM | POA: Diagnosis not present

## 2019-06-01 DIAGNOSIS — M542 Cervicalgia: Secondary | ICD-10-CM | POA: Diagnosis not present

## 2019-06-01 NOTE — Therapy (Signed)
[facility] [address], [place], [address] Phone: [phone]   Fax:  [phone]  Physical Therapy Evaluation  Patient Details  Name: Amy Mcconnell MRN: [id number] Date of Birth: [birth year] Referring Provider (PT): Tommi Rumps, MD   Encounter Date: 06/01/2019  PT End of Session - 06/01/19 1542    Visit Number  1    Number of Visits  13    Date for PT Re-Evaluation  07/16/19    Authorization Type  1    Authorization Time Period  of 10 Medicare    PT Start Time  1542    PT Stop Time  1701    PT Time Calculation (min)  79 min    Activity Tolerance  Patient tolerated treatment well    Behavior During Therapy  [facility] for tasks assessed/performed       Past Medical History:  Diagnosis Date  . 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 Anxiety   . Bipolar disorder ([place])    Dr. Thurmond Butts - every 3 months  . Broken foot    left   . Contact dermatitis and other eczema due to other specified agent   . Contusion of unspecified part of lower limb   . Essential hypertension, benign   . Heart murmur   . Other and unspecified hyperlipidemia   . Other specified disorders of thyroid   . Reflux esophagitis   . Tachycardia    a. isolated episode, seen in ED with negative work up    Past Surgical History:  Procedure Laterality Date  . CATARACT EXTRACTION    . SALPINGECTOMY Left   . STERILIZATION     Her decision since dz of bipolar    There were no vitals filed for this visit.   Subjective Assessment - 06/01/19 1546    Subjective  neck pain includes B UT, as well as B scalene area: 0/10 currently (pt not moving her head), 9/10 at worst for the past 2 weeks (when driving).  L foot pain: dorsal and lateral foot: 0/10 currently (pt sitting), 6/10 at most for the past 2 weeks (when walking, lateral foot)    Pertinent History  Neck and L foot pain. Pt went out the back door and hit her head on the roof  of the stairway. Pt fell backwards slammed the L side of her face onto the concrete floor. Had laceration on her head. Had stiches which were removed last Friday. Had hemorrhages L lateral head. Pt fell on April 08, 2019. No other falls beside that. Looking up has improved since injury. Cervical rotation level of comfort has stayed about the same.  Had PT before in which massage helped her L shoulder.  L foot pain began at the same day of her fall. Had bruising (L heel, dorsal foot, and toes)  and swelling L foot after injury.    Patient Stated Goals  Want the pain to go away and the swelling in her L foot to go away.    Currently in Pain?  No/denies    Pain Score  0-No pain    Pain Type  Acute pain    Aggravating Factors   neck: driving and turning her head (takes her breath away), looking up    Pain Relieving Factors  rest         [facility] PT Assessment - 06/01/19 1600      Assessment   Medical Diagnosis  L  foot pain, neck pain    Referring Provider (PT)  Tommi Rumps, MD    Onset Date/Surgical Date  04/08/19    Prior Therapy  No known PT for current condition. Had prior PT for L shoulder with good results.       Precautions   Precaution Comments  Healing laceration at scalp      Restrictions   Other Position/Activity Restrictions  no known restrictions      Balance Screen   Has the patient fallen in the past 6 months  Yes    How many times?  1    Has the patient had a decrease in activity level because of a fear of falling?   No    Is the patient reluctant to leave their home because of a fear of falling?   No      Home Environment   Additional Comments  Pt lives in a 2 story home alone. 2 steps to enter the garage (usual entrance) no rails. 13 steps to the basement where her washer and dryer are with on rail. 5 steps front door, no rail currently.       Prior Government social research officer work   at [employer]  PLOF: better able to turn her head as well as  ambulate more comfortably.       Cognition   Overall Cognitive Status  Within Functional Limits for tasks assessed      Observation/Other Assessments   Observations  Functional squat: decreased knee flexion, decreased ankle DF. More L cervical rotation and side bending in supine (gravity elimitated) compared to sitting.       Posture/Postural Control   Posture Comments  B protracted shouldres and neck, R cervical side bend, backward lean (hanging on "Y" posture), slight R lateral lean, L scapular winging,       AROM   Left Ankle Dorsiflexion  2   7 degrees AAROM   Cervical Flexion  full, B lateral neck pain with return motion     Cervical Extension  limited     Cervical - Right Side Bend  WFL   supine: WFL with pain   Cervical - Left Side Bend  very limited with R lateral neck pain   supine: WFL wiht pain    Cervical - Right Rotation  limited with R lateral neck pain   supine: 23 degrees with pain   Cervical - Left Rotation  very limited with R lateral neck pain    supine: 27 degrees with pain      Strength   Right Hip Extension  4-/5    Right Hip ABduction  4/5    Left Hip Extension  4-/5    Left Hip ABduction  4-/5      Palpation   Palpation comment  R suboccipital muscle tension      Ambulation/Gait   Gait Comments  antalgic, decreased stance L LE, decreased L ankle DF during foot flat to push off phase.                 Objective measurements completed on examination: See above findings.     The patient has been informed of current processes in place at [facility]patient [facility] to protect patients from Covid-19 exposure including social distancing, schedule modifications, and new cleaning procedures. After discussing their particular risk with a therapist based on the patient's personal risk factors, the patient has decided to proceed with in-person therapy.  [place] Access code Access Code: [email or web address]  Therapeutic exercise Supine cervical nodding 10x3. R  suboccipital discomfort which eases with repetition.   Supine chin tucks 10x5 seconds for 2 sets. HEP  Then with L cervical rotation 10x   Less R lateral neck discomfort with L cervical rotation with chin tuck position  Seated L cervical rotation with chin tuck and scapular retraction   More comfortable     Supine L ankle DF AAROM 10x2  Standing wall L gastroc stretch 30 seconds x 2  Reviewed HEP. Pt demonstrated and verbalized understanding. Handout provided.    Improved exercise technique, movement at target joints, use of target muscles after mod verbal, visual, tactile cues.    Manual therapy   Supine STM to R suboccipital muscle to decrease stiffness  No change in supine L cervical rotation comfort level    supine A to P to L ankle grade 3- to 3   Response to treatment  Improved L ankle DF AROM to 5 degrees and decreased L ankle discomfort after after treatment to promote ankle DF. Decreased neck pain with cervical rotation following treatment to promote anterior cervical muscle and scapular activation.     Clinical impression Patient is a 68 year old female who came to physical therapy secondary to neck and L foot pain since her fall April 08, 2019. She also presents with altered gait pattern and posture, anterior cervical and B scapular weakness, decreased cervical motor control, decreased B glute med and max strength, decreased L ankle DF AROM, and difficulty performing functional tasks such as driving and walking. Pt will benefit from skilled physical therapy services to address the aforementioned deficits.      PT Education - 06/01/19 1714    Education Details  ther-ex, HEP, plan of care    Person(s) Educated  Patient    Methods  Explanation;Demonstration;Tactile cues;Verbal cues;Handout    Comprehension  Returned demonstration;Verbalized understanding      PT Short Term Goals - 06/01/19 1720      PT SHORT TERM GOAL #1   Title  Pt will be independent with her  HEP to decrease pain, improve strength and function.    Baseline  Pt has started her HEP. (06/01/2019)    Time  3    Period  Weeks    Status  New    Target Date  06/25/19        PT Long Term Goals - 06/01/19 1721      PT LONG TERM GOAL #1   Title  Pt will have a decrease in neck pain to 3/10 or less at worst to promote ability to drive and turn her head more comfortably.    Baseline  9/10 neck pain at worst for the past 2 weeks (06/01/2019)    Time  6    Period  Weeks    Status  New    Target Date  07/16/19      PT LONG TERM GOAL #2   Title  Patient will have a decrease in L foot and ankle pain to 2/10 or less at worst to promote ability to ambulate more comfortably.    Baseline  6/10 L foot pain at worst for the past 2 weeks. (06/01/2019)    Time  6    Period  Weeks    Status  New    Target Date  07/16/19      PT LONG TERM GOAL #3   Title  Patient will improve B hip  extension and abduction strength by at least 1/2 MMT grade to promote ability to ambulate with less L foot and ankle pain/discomfort.    Time  6    Period  Weeks    Status  New    Target Date  07/16/19             Plan - 06/01/19 1715    Clinical Impression Statement  Patient is a 68 year old female who came to physical therapy secondary to neck and L foot pain since her fall April 08, 2019. She also presents with altered gait pattern and posture, anterior cervical and B scapular weakness, decreased cervical motor control, decreased B glute med and max strength, decreased L ankle DF AROM, and difficulty performing functional tasks such as driving and walking. Pt will benefit from skilled physical therapy services to address the aforementioned deficits.    Personal Factors and Comorbidities  Age    Examination-Activity Limitations  Squat;Other   turning her head   Stability/Clinical Decision Making  Stable/Uncomplicated    Clinical Decision Making  Low    Rehab Potential  Good    PT Frequency  2x / week     PT Duration  6 weeks    PT Treatment/Interventions  Manual techniques;Neuromuscular re-education;Therapeutic exercise;Therapeutic activities;Aquatic Therapy;Biofeedback;Canalith Repostioning;Electrical Stimulation;Iontophoresis 4mg /ml Dexamethasone;Traction;Ultrasound;Patient/family education;Dry needling;Spinal Manipulations;Joint Manipulations   traction, manipulation if appropriate   PT Next Visit Plan  anterior cervical, B scapular, B hip, trunk, ankle strengthening, manual techniques, modalities PRN.    Consulted and Agree with Plan of Care  Patient       Patient will benefit from skilled therapeutic intervention in order to improve the following deficits and impairments:  Pain, Postural dysfunction, Improper body mechanics, Difficulty walking, Decreased strength, Decreased range of motion  Visit Diagnosis: 1. Cervicalgia   2. Pain in left ankle and joints of left foot   3. Muscle weakness (generalized)   4. Difficulty in walking, not elsewhere classified        Problem List Patient Active Problem List   Diagnosis Date Noted  . Neck pain 05/06/2019  . Scalp laceration 04/13/2019  . Anemia 04/13/2019  . Hypothyroidism 02/28/2019  . Paroxysmal tachycardia ([place]) 10/14/2018  . Stress incontinence 04/01/2016  . Bradycardia 08/19/2015  . Bipolar disorder ([place]) 03/30/2015  . Anxiety   . Essential hypertension 10/06/2014  . Left foot pain 08/20/2014  . Chest pain with moderate risk for cardiac etiology 07/23/2014  . 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 Tachycardia 01/22/2014  . Smoking history 01/22/2014  . Hyperlipidemia 01/22/2014     Joneen Boers PT, DPT   06/01/2019, 5:48 PM  [facility] [address], [place], [address] Phone: [phone]   Fax:  [phone]  Name: LARYAH NEUSER MRN: [id number] Date of Birth: 19-Jul-1951

## 2019-06-01 NOTE — Patient Instructions (Signed)
[employer] Access code Access Code: [email or web address]  Wall gastroc stretch 30 seconds x 3 for 2-3x per day  Supine head nod 3x10 for 2-3x/day  Supine chin tucks 10x3 with 5 second holds

## 2019-06-05 ENCOUNTER — Other Ambulatory Visit: Payer: Self-pay

## 2019-06-05 ENCOUNTER — Other Ambulatory Visit (INDEPENDENT_AMBULATORY_CARE_PROVIDER_SITE_OTHER): Payer: Medicare HMO

## 2019-06-05 DIAGNOSIS — D649 Anemia, unspecified: Secondary | ICD-10-CM | POA: Diagnosis not present

## 2019-06-05 LAB — CBC WITH DIFFERENTIAL/PLATELET
Basophils Absolute: 0 10*3/uL (ref 0.0–0.1)
Basophils Relative: 0.6 % (ref 0.0–3.0)
Eosinophils Absolute: 0.1 10*3/uL (ref 0.0–0.7)
Eosinophils Relative: 2.3 % (ref 0.0–5.0)
HCT: 36.1 % (ref 36.0–46.0)
Hemoglobin: 11.6 g/dL — ABNORMAL LOW (ref 12.0–15.0)
Lymphocytes Relative: 24.7 % (ref 12.0–46.0)
Lymphs Abs: 1.2 10*3/uL (ref 0.7–4.0)
MCHC: 32.3 g/dL (ref 30.0–36.0)
MCV: 87.9 fl (ref 78.0–100.0)
Monocytes Absolute: 0.5 10*3/uL (ref 0.1–1.0)
Monocytes Relative: 10.4 % (ref 3.0–12.0)
Neutro Abs: 2.9 10*3/uL (ref 1.4–7.7)
Neutrophils Relative %: 62 % (ref 43.0–77.0)
Platelets: 163 10*3/uL (ref 150.0–400.0)
RBC: 4.1 Mil/uL (ref 3.87–5.11)
RDW: 14.3 % (ref 11.5–15.5)
WBC: 4.7 10*3/uL (ref 4.0–10.5)

## 2019-06-08 ENCOUNTER — Other Ambulatory Visit: Payer: Self-pay

## 2019-06-08 ENCOUNTER — Ambulatory Visit: Payer: Medicare HMO | Attending: Family Medicine

## 2019-06-08 DIAGNOSIS — M542 Cervicalgia: Secondary | ICD-10-CM | POA: Diagnosis not present

## 2019-06-08 DIAGNOSIS — M25572 Pain in left ankle and joints of left foot: Secondary | ICD-10-CM | POA: Diagnosis not present

## 2019-06-08 DIAGNOSIS — M6281 Muscle weakness (generalized): Secondary | ICD-10-CM | POA: Diagnosis not present

## 2019-06-08 DIAGNOSIS — R262 Difficulty in walking, not elsewhere classified: Secondary | ICD-10-CM | POA: Diagnosis not present

## 2019-06-08 NOTE — Patient Instructions (Addendum)
standing B shoulder flexion with scapular retraction , emphasis on neutral neck 10x2 with 5 lbs each hand   Improved comfort with L cervical rotation. Slight increased R lateral neck discomfort with R cervical rotation. Pt can continue the exercise at home so long as it does not make her neck worse (pt already performing this at home but wanted to check with PT). Pt verbalized understanding.

## 2019-06-08 NOTE — Therapy (Signed)
[facility] [address], [place], [address] Phone: [phone]   Fax:  [phone]  Physical Therapy Treatment  Patient Details  Name: Amy Mcconnell MRN: [id number] Date of Birth: 04-25-1951 Referring Provider (PT): Tommi Rumps, MD   Encounter Date: 06/08/2019  PT End of Session - 06/08/19 1352    Visit Number  2    Number of Visits  13    Date for PT Re-Evaluation  07/16/19    Authorization Type  2    Authorization Time Period  of 10 Medicare    PT Start Time  1352   pt arrived late   PT Stop Time  1434    PT Time Calculation (min)  42 min    Activity Tolerance  Patient tolerated treatment well    Behavior During Therapy  [employer] for tasks assessed/performed       Past Medical History:  Diagnosis Date  . 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 Anxiety   . Bipolar disorder ([place])    Dr. Thurmond Butts - every 3 months  . Broken foot    left   . Contact dermatitis and other eczema due to other specified agent   . Contusion of unspecified part of lower limb   . Essential hypertension, benign   . Heart murmur   . Other and unspecified hyperlipidemia   . Other specified disorders of thyroid   . Reflux esophagitis   . Tachycardia    a. isolated episode, seen in ED with negative work up    Past Surgical History:  Procedure Laterality Date  . CATARACT EXTRACTION    . SALPINGECTOMY Left   . STERILIZATION     Her decision since dz of bipolar    There were no vitals filed for this visit.  Subjective Assessment - 06/08/19 1353    Subjective  Neck was feeling better earlier. However, pt was driving and had to look to the L which bothered her neck. The L foot is much better.  No L ankle and foot pain currently.  4/10 neck pain with L cervical rotation currently.    Pertinent History  Neck and L foot pain. Pt went out the back door and hit her head on the roof of the stairway. Pt fell backwards  slammed the L side of her face onto the concrete floor. Had laceration on her head. Had stiches which were removed last Friday. Had hemorrhages L lateral head. Pt fell on April 08, 2019. No other falls beside that. Looking up has improved since injury. Cervical rotation level of comfort has stayed about the same.  Had PT before in which massage helped her L shoulder.  L foot pain began at the same day of her fall. Had bruising (L heel, dorsal foot, and toes)  and swelling L foot after injury.    Patient Stated Goals  Want the pain to go away and the swelling in her L foot to go away.    Currently in Pain?  Yes    Pain Score  4                                PT Education - 06/08/19 1423    Education Details  ther-ex    Person(s) Educated  Patient    Methods  Explanation;Demonstration;Tactile cues;Verbal cues    Comprehension  Returned demonstration;Verbalized understanding  Objective     Medbridge Access code Access Code: [email or web address]  Therapeutic exercise  Seated manually resisted scapular retraction targeting the lower trap muscles   L 10x5 seconds   R 10x5 seconds    standing B shoulder flexion with scapular retraction , emphasis on neutral neck 10x2 with 5 lbs each hand   Improved comfort with L cervical rotation. Slight increased R lateral neck discomfort with R cervical rotation. Pt can continue the exercise at home so long as it does not make her neck worse.   Seated manually resisted L cervical lateral shift in neutral to decrease R lateral shift posture.   10x5 seconds for 3 sets  Seated gentle eccentric L cervical rotation, resistance from PT 10x3  Decreased neck pain with L cervical rotation   Seated manually resisted R scapular depression isometrics in neutral 10x5 seconds for 2 sets   Improved exercise technique, movement at target joints, use of target muscles after mod verbal, visual, tactile cues.    Manual therapy   Seated STM  to B cervical paraspinal muscle  Seated STM R upper trap and rhomboid muscles  Seated STM R scalene muscle area   Response to treatment  Improved comfort level with L cervical rotation after treatment to decrease muscle tension around neck as well as eccentric activation of R cervical rotator muscles. Pt tolerated session well without aggravation of symptoms.     Clinical impression Continued working on treatment to decrease muscle tension around the neck as well as promoting scapular strength to promote upper thoracic extension as well as posture. Improved L cervical rotation comfort level with eccentric activation of R cervical rotator muscles. Pt tolerated session well without aggravation of symptoms. Pt will benefit from continued skilled physical therapy services to decrease pain, improve cervical ROM, improve UE strength and function.      PT Short Term Goals - 06/01/19 1720      PT SHORT TERM GOAL #1   Title  Pt will be independent with her HEP to decrease pain, improve strength and function.    Baseline  Pt has started her HEP. (06/01/2019)    Time  3    Period  Weeks    Status  New    Target Date  06/25/19        PT Long Term Goals - 06/01/19 1721      PT LONG TERM GOAL #1   Title  Pt will have a decrease in neck pain to 3/10 or less at worst to promote ability to drive and turn her head more comfortably.    Baseline  9/10 neck pain at worst for the past 2 weeks (06/01/2019)    Time  6    Period  Weeks    Status  New    Target Date  07/16/19      PT LONG TERM GOAL #2   Title  Patient will have a decrease in L foot and ankle pain to 2/10 or less at worst to promote ability to ambulate more comfortably.    Baseline  6/10 L foot pain at worst for the past 2 weeks. (06/01/2019)    Time  6    Period  Weeks    Status  New    Target Date  07/16/19      PT LONG TERM GOAL #3   Title  Patient will improve B hip extension and abduction strength by at least 1/2 MMT grade  to promote ability to ambulate with less  L foot and ankle pain/discomfort.    Time  6    Period  Weeks    Status  New    Target Date  07/16/19            Plan - 06/08/19 1949    Clinical Impression Statement  Continued working on treatment to decrease muscle tension around the neck as well as promoting scapular strength to promote upper thoracic extension as well as posture. Improved L cervical rotation comfort level with eccentric activation of R cervical rotator muscles. Pt tolerated session well without aggravation of symptoms. Pt will benefit from continued skilled physical therapy services to decrease pain, improve cervical ROM, improve UE strength and function.    Personal Factors and Comorbidities  Age    Examination-Activity Limitations  Squat;Other   turning her head   Stability/Clinical Decision Making  Stable/Uncomplicated    Rehab Potential  Good    PT Frequency  2x / week    PT Duration  6 weeks    PT Treatment/Interventions  Manual techniques;Neuromuscular re-education;Therapeutic exercise;Therapeutic activities;Aquatic Therapy;Biofeedback;Canalith Repostioning;Electrical Stimulation;Iontophoresis 4mg /ml Dexamethasone;Traction;Ultrasound;Patient/family education;Dry needling;Spinal Manipulations;Joint Manipulations   traction, manipulation if appropriate   PT Next Visit Plan  anterior cervical, B scapular, B hip, trunk, ankle strengthening, manual techniques, modalities PRN.    Consulted and Agree with Plan of Care  Patient       Patient will benefit from skilled therapeutic intervention in order to improve the following deficits and impairments:  Pain, Postural dysfunction, Improper body mechanics, Difficulty walking, Decreased strength, Decreased range of motion  Visit Diagnosis: 1. Cervicalgia   2. Muscle weakness (generalized)        Problem List Patient Active Problem List   Diagnosis Date Noted  . Neck pain 05/06/2019  . Scalp laceration 04/13/2019  .  Anemia 04/13/2019  . Hypothyroidism 02/28/2019  . Paroxysmal tachycardia ([place]) 10/14/2018  . Stress incontinence 04/01/2016  . Bradycardia 08/19/2015  . Bipolar disorder ([place]) 03/30/2015  . Anxiety   . Essential hypertension 10/06/2014  . Left foot pain 08/20/2014  . Chest pain with moderate risk for cardiac etiology 07/23/2014  . 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 Tachycardia 01/22/2014  . Smoking history 01/22/2014  . Hyperlipidemia 01/22/2014   Joneen Boers PT, DPT   06/08/2019, 8:02 PM  [facility] [address], [place], [address] Phone: [phone]   Fax:  [phone]  Name: ALEZANDRA EGLI MRN: [id number] Date of Birth: 1950/11/25

## 2019-06-09 ENCOUNTER — Encounter: Payer: Self-pay | Admitting: *Deleted

## 2019-06-11 ENCOUNTER — Other Ambulatory Visit: Payer: Self-pay

## 2019-06-11 ENCOUNTER — Ambulatory Visit: Payer: Medicare HMO

## 2019-06-11 DIAGNOSIS — M542 Cervicalgia: Secondary | ICD-10-CM | POA: Diagnosis not present

## 2019-06-11 DIAGNOSIS — M6281 Muscle weakness (generalized): Secondary | ICD-10-CM | POA: Diagnosis not present

## 2019-06-11 DIAGNOSIS — M25572 Pain in left ankle and joints of left foot: Secondary | ICD-10-CM | POA: Diagnosis not present

## 2019-06-11 DIAGNOSIS — R262 Difficulty in walking, not elsewhere classified: Secondary | ICD-10-CM | POA: Diagnosis not present

## 2019-06-11 NOTE — Therapy (Signed)
[facility] [address] [place], [address] Phone: [phone]   Fax:  [phone]  Physical Therapy Treatment  Patient Details  Name: Amy Mcconnell MRN: [id number] Date of Birth: [birth year] Referring Provider (PT): Tommi Rumps, MD   Encounter Date: 06/11/2019  PT End of Session - 06/11/19 1432    Visit Number  3    Number of Visits  13    Date for PT Re-Evaluation  07/16/19    Authorization Type  3    Authorization Time Period  of 10 Medicare    PT Start Time  1435    PT Stop Time  1522    PT Time Calculation (min)  47 min    Activity Tolerance  Patient tolerated treatment well    Behavior During Therapy  [facility] for tasks assessed/performed       Past Medical History:  Diagnosis Date  . 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 Anxiety   . Bipolar disorder ([place])    Dr. Thurmond Butts - every 3 months  . Broken foot    left   . Contact dermatitis and other eczema due to other specified agent   . Contusion of unspecified part of lower limb   . Essential hypertension, benign   . Heart murmur   . Other and unspecified hyperlipidemia   . Other specified disorders of thyroid   . Reflux esophagitis   . Tachycardia    a. isolated episode, seen in ED with negative work up    Past Surgical History:  Procedure Laterality Date  . CATARACT EXTRACTION    . SALPINGECTOMY Left   . STERILIZATION     Her decision since dz of bipolar    There were no vitals filed for this visit.  Subjective Assessment - 06/11/19 1439    Subjective  4/10 R lateral neck pain when turning her head a lot to the L. L ankle is better.    Pertinent History  Neck and L foot pain. Pt went out the back door and hit her head on the roof of the stairway. Pt fell backwards slammed the L side of her face onto the concrete floor. Had laceration on her head. Had stiches which were removed last Friday. Had hemorrhages L lateral head. Pt  fell on April 08, 2019. No other falls beside that. Looking up has improved since injury. Cervical rotation level of comfort has stayed about the same.  Had PT before in which massage helped her L shoulder.  L foot pain began at the same day of her fall. Had bruising (L heel, dorsal foot, and toes)  and swelling L foot after injury.    Patient Stated Goals  Want the pain to go away and the swelling in her L foot to go away.    Currently in Pain?  Yes    Pain Score  4                                PT Education - 06/11/19 1443    Education Details  ther-ex    Person(s) Educated  Patient    Methods  Explanation;Demonstration;Tactile cues;Verbal cues    Comprehension  Returned demonstration;Verbalized understanding       Objective    Medbridge Access codeAccess Code: [email or web address]  Therapeutic exercise  Seated cervical nod isometrics in chin tuck position 10x5 seconds for 3  sets  Seated manually resisted L cervical lateral shift in neutral to decrease R lateral shift posture.              10x5 seconds for 3 sets  R shoulder extension with scapular retraction yellow band   Then L cervical rotation   10x3    Slight decrease in neck pain with L cervical rotation  Standing B scapular retraction yellow band 10x5 seconds for 3 sets  Caudal pressure R first rib area  With L cervical rotation 10x3   B scapular retraction with chin tuck and L cervical rotation. Less pain with L cervical rotation    Improved exercise technique, movement at target joints, use of target muscles after mod verbal, visual, tactile cues.   Manual therapy  Seated STM L upper trap    Response to treatment  Pt tolerated session well without aggravation of symptoms.    Clinical impression Decreased neck pain with L cervical rotation with activation of R scapular muscles and anterior cervical muscles. Pt will benefit from continued skilled physical therapy services to decrease  pain, improve cervical rotation, improve strength, and function.       PT Short Term Goals - 06/01/19 1720      PT SHORT TERM GOAL #1   Title  Pt will be independent with her HEP to decrease pain, improve strength and function.    Baseline  Pt has started her HEP. (06/01/2019)    Time  3    Period  Weeks    Status  New    Target Date  06/25/19        PT Long Term Goals - 06/01/19 1721      PT LONG TERM GOAL #1   Title  Pt will have a decrease in neck pain to 3/10 or less at worst to promote ability to drive and turn her head more comfortably.    Baseline  9/10 neck pain at worst for the past 2 weeks (06/01/2019)    Time  6    Period  Weeks    Status  New    Target Date  07/16/19      PT LONG TERM GOAL #2   Title  Patient will have a decrease in L foot and ankle pain to 2/10 or less at worst to promote ability to ambulate more comfortably.    Baseline  6/10 L foot pain at worst for the past 2 weeks. (06/01/2019)    Time  6    Period  Weeks    Status  New    Target Date  07/16/19      PT LONG TERM GOAL #3   Title  Patient will improve B hip extension and abduction strength by at least 1/2 MMT grade to promote ability to ambulate with less L foot and ankle pain/discomfort.    Time  6    Period  Weeks    Status  New    Target Date  07/16/19            Plan - 06/11/19 1429    Clinical Impression Statement  Decreased neck pain with L cervical rotation with activation of R scapular muscles and anterior cervical muscles. Pt will benefit from continued skilled physical therapy services to decrease pain, improve cervical rotation, improve strength, and function.    Personal Factors and Comorbidities  Age    Examination-Activity Limitations  Squat;Other   turning her head   Stability/Clinical Decision Making  Stable/Uncomplicated  Rehab Potential  Good    PT Frequency  2x / week    PT Duration  6 weeks    PT Treatment/Interventions  Manual techniques;Neuromuscular  re-education;Therapeutic exercise;Therapeutic activities;Aquatic Therapy;Biofeedback;Canalith Repostioning;Electrical Stimulation;Iontophoresis 4mg /ml Dexamethasone;Traction;Ultrasound;Patient/family education;Dry needling;Spinal Manipulations;Joint Manipulations   traction, manipulation if appropriate   PT Next Visit Plan  anterior cervical, B scapular, B hip, trunk, ankle strengthening, manual techniques, modalities PRN.    Consulted and Agree with Plan of Care  Patient       Patient will benefit from skilled therapeutic intervention in order to improve the following deficits and impairments:  Pain, Postural dysfunction, Improper body mechanics, Difficulty walking, Decreased strength, Decreased range of motion  Visit Diagnosis: 1. Cervicalgia   2. Muscle weakness (generalized)        Problem List Patient Active Problem List   Diagnosis Date Noted  . Neck pain 05/06/2019  . Scalp laceration 04/13/2019  . Anemia 04/13/2019  . Hypothyroidism 02/28/2019  . Paroxysmal tachycardia ([place]) 10/14/2018  . Stress incontinence 04/01/2016  . Bradycardia 08/19/2015  . Bipolar disorder ([place]) 03/30/2015  . Anxiety   . Essential hypertension 10/06/2014  . Left foot pain 08/20/2014  . Chest pain with moderate risk for cardiac etiology 07/23/2014  . 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 Tachycardia 01/22/2014  . Smoking history 01/22/2014  . Hyperlipidemia 01/22/2014    Joneen Boers PT, DPT  06/11/2019, 6:19 PM  [facility] [address] [place], [address] Phone: [phone]   Fax:  [phone]  Name: Amy Mcconnell MRN: [id number] Date of Birth: 1951/07/16

## 2019-06-15 ENCOUNTER — Ambulatory Visit: Payer: Medicare HMO

## 2019-06-15 ENCOUNTER — Other Ambulatory Visit: Payer: Self-pay

## 2019-06-15 DIAGNOSIS — M542 Cervicalgia: Secondary | ICD-10-CM | POA: Diagnosis not present

## 2019-06-15 DIAGNOSIS — R262 Difficulty in walking, not elsewhere classified: Secondary | ICD-10-CM | POA: Diagnosis not present

## 2019-06-15 DIAGNOSIS — M6281 Muscle weakness (generalized): Secondary | ICD-10-CM | POA: Diagnosis not present

## 2019-06-15 DIAGNOSIS — M25572 Pain in left ankle and joints of left foot: Secondary | ICD-10-CM | POA: Diagnosis not present

## 2019-06-15 NOTE — Therapy (Signed)
[facility] [address] [place], [address] Phone: [phone]   Fax:  [phone]  Physical Therapy Treatment  Patient Details  Name: Amy Mcconnell MRN: [id number] Date of Birth: Dec 17, 1950 Referring Provider (PT): Tommi Rumps, MD   Encounter Date: 06/15/2019  PT End of Session - 06/15/19 1350    Visit Number  4    Number of Visits  13    Date for PT Re-Evaluation  07/16/19    Authorization Type  4    Authorization Time Period  of 10 Medicare    PT Start Time  1350    PT Stop Time  1433    PT Time Calculation (min)  43 min    Activity Tolerance  Patient tolerated treatment well    Behavior During Therapy  [facility] for tasks assessed/performed       Past Medical History:  Diagnosis Date  . 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 Anxiety   . Bipolar disorder ([place])    Dr. Thurmond Butts - every 3 months  . Broken foot    left   . Contact dermatitis and other eczema due to other specified agent   . Contusion of unspecified part of lower limb   . Essential hypertension, benign   . Heart murmur   . Other and unspecified hyperlipidemia   . Other specified disorders of thyroid   . Reflux esophagitis   . Tachycardia    a. isolated episode, seen in ED with negative work up    Past Surgical History:  Procedure Laterality Date  . CATARACT EXTRACTION    . SALPINGECTOMY Left   . STERILIZATION     Her decision since dz of bipolar    There were no vitals filed for this visit.  Subjective Assessment - 06/15/19 1352    Subjective  Neck feels pretty good. Has been doing her exercises. Did not have any trouble turning her head to the L when driving here today.    Pertinent History  Neck and L foot pain. Pt went out the back door and hit her head on the roof of the stairway. Pt fell backwards slammed the L side of her face onto the concrete floor. Had laceration on her head. Had stiches which were removed  last Friday. Had hemorrhages L lateral head. Pt fell on April 08, 2019. No other falls beside that. Looking up has improved since injury. Cervical rotation level of comfort has stayed about the same.  Had PT before in which massage helped her L shoulder.  L foot pain began at the same day of her fall. Had bruising (L heel, dorsal foot, and toes)  and swelling L foot after injury.    Patient Stated Goals  Want the pain to go away and the swelling in her L foot to go away.    Currently in Pain?  Yes    Pain Score  4    3-4/10 with R cervical rotation.                              PT Education - 06/15/19 1930    Education Details  ther-ex    Person(s) Educated  Patient    Methods  Explanation;Demonstration;Tactile cues;Verbal cues    Comprehension  Returned demonstration;Verbalized understanding      Objective    Medbridge Access codeAccess Code: [email or web address]  Manual therapy  Seated  STM R levator scapulae and R rhomboid muscles  Decreased R lateral neck pain with R cervical rotation  Decreased neck pain with L cervical rotation.   Seated STM L cervical paraspinal and L rhomboid muscle to decrease tension    Therapeutic exercise  R cervical rotation with PT assist to decrease R cervical side bend. Decreased R lateral neck pain with R cervical rotation  Seated L cervical side bend isometrics in neutral 10x3 with 3 second holds  Seated cervical rotation with PT assist for neutral neck (decrease R cervical side bend)  R 10x3  L 10x3   Seated cervical nod isometrics in chin tuck position 10x5 seconds for 3 sets  Difficulty with motor control when B scapular retraction was added. Pt demonstrates tendency for cervical protraction  Seated manually resisted scapular retraction targeting lower trap muscle  L 10x5 seconds for 3 sets  Decreased pain with L and R cervical rotation.   Improved exercise technique, movement at target joints, use of target muscles  after mod verbal, visual, tactile cues.      Response to treatment Pt tolerated session well without aggravation of symptoms.   Clinical impression Continued worlking on L lower trap strengthening and anterior cervical strengthening and decreasing posterior cervical paraspinal, rhomboid and levator scapula muscle tension. Also worked on cervical rotation controlling R cervical side bend. Decreased pain with R and L cervical rotation after session. Pt will benefit from continued skilled physical therapy services to decrease pain, improve ROM, strength and function.      PT Short Term Goals - 06/01/19 1720      PT SHORT TERM GOAL #1   Title  Pt will be independent with her HEP to decrease pain, improve strength and function.    Baseline  Pt has started her HEP. (06/01/2019)    Time  3    Period  Weeks    Status  New    Target Date  06/25/19        PT Long Term Goals - 06/01/19 1721      PT LONG TERM GOAL #1   Title  Pt will have a decrease in neck pain to 3/10 or less at worst to promote ability to drive and turn her head more comfortably.    Baseline  9/10 neck pain at worst for the past 2 weeks (06/01/2019)    Time  6    Period  Weeks    Status  New    Target Date  07/16/19      PT LONG TERM GOAL #2   Title  Patient will have a decrease in L foot and ankle pain to 2/10 or less at worst to promote ability to ambulate more comfortably.    Baseline  6/10 L foot pain at worst for the past 2 weeks. (06/01/2019)    Time  6    Period  Weeks    Status  New    Target Date  07/16/19      PT LONG TERM GOAL #3   Title  Patient will improve B hip extension and abduction strength by at least 1/2 MMT grade to promote ability to ambulate with less L foot and ankle pain/discomfort.    Time  6    Period  Weeks    Status  New    Target Date  07/16/19             Plan - 06/15/19 1931    Clinical Impression Statement  Continued worlking  on L lower trap strengthening and  anterior cervical strengthening and decreasing posterior cervical paraspinal, rhomboid and levator scapula muscle tension. Also worked on cervical rotation controlling R cervical side bend. Decreased pain with R and L cervical rotation after session. Pt will benefit from continued skilled physical therapy services to decrease pain, improve ROM, strength and function.    Personal Factors and Comorbidities  Age    Examination-Activity Limitations  Squat;Other   turning her head   Stability/Clinical Decision Making  Stable/Uncomplicated    Rehab Potential  Good    PT Frequency  2x / week    PT Duration  6 weeks    PT Treatment/Interventions  Manual techniques;Neuromuscular re-education;Therapeutic exercise;Therapeutic activities;Aquatic Therapy;Biofeedback;Canalith Repostioning;Electrical Stimulation;Iontophoresis 4mg /ml Dexamethasone;Traction;Ultrasound;Patient/family education;Dry needling;Spinal Manipulations;Joint Manipulations   traction, manipulation if appropriate   PT Next Visit Plan  anterior cervical, B scapular, B hip, trunk, ankle strengthening, manual techniques, modalities PRN.    Consulted and Agree with Plan of Care  Patient       Patient will benefit from skilled therapeutic intervention in order to improve the following deficits and impairments:  Pain, Postural dysfunction, Improper body mechanics, Difficulty walking, Decreased strength, Decreased range of motion  Visit Diagnosis: 1. Cervicalgia   2. Muscle weakness (generalized)        Problem List Patient Active Problem List   Diagnosis Date Noted  . Neck pain 05/06/2019  . Scalp laceration 04/13/2019  . Anemia 04/13/2019  . Hypothyroidism 02/28/2019  . Paroxysmal tachycardia ([place]) 10/14/2018  . Stress incontinence 04/01/2016  . Bradycardia 08/19/2015  . Bipolar disorder ([place]) 03/30/2015  . Anxiety   . Essential hypertension 10/06/2014  . Left foot pain 08/20/2014  . Chest pain with moderate risk for cardiac  etiology 07/23/2014  . 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 Tachycardia 01/22/2014  . Smoking history 01/22/2014  . Hyperlipidemia 01/22/2014    Joneen Boers PT, DPT   06/15/2019, 7:39 PM  [facility] [address], [place], [address]  Phone: [phone]   Fax:  [phone]  Name: Amy Mcconnell MRN: [id number] Date of Birth: 02-06-1951

## 2019-06-18 ENCOUNTER — Other Ambulatory Visit: Payer: Self-pay

## 2019-06-18 ENCOUNTER — Ambulatory Visit: Payer: Medicare HMO

## 2019-06-18 DIAGNOSIS — M542 Cervicalgia: Secondary | ICD-10-CM | POA: Diagnosis not present

## 2019-06-18 DIAGNOSIS — M6281 Muscle weakness (generalized): Secondary | ICD-10-CM | POA: Diagnosis not present

## 2019-06-18 DIAGNOSIS — M25572 Pain in left ankle and joints of left foot: Secondary | ICD-10-CM | POA: Diagnosis not present

## 2019-06-18 DIAGNOSIS — R262 Difficulty in walking, not elsewhere classified: Secondary | ICD-10-CM | POA: Diagnosis not present

## 2019-06-18 NOTE — Patient Instructions (Signed)
Cervical Flexion Isometrics    With head at neutral, gently press your chin against your thumbs to feel the muscles in front of your neck activate.    Hold for 5 seconds   Repeat 10 times   Perform 3 sets daily

## 2019-06-18 NOTE — Therapy (Signed)
[facility] [address], [place], [address] Phone: [phone]   Fax:  [phone]  Physical Therapy Treatment  Patient Details  Name: Amy Mcconnell MRN: [id number] Date of Birth: 1951-06-11 Referring Provider (PT): Tommi Rumps, MD   Encounter Date: 06/18/2019  PT End of Session - 06/18/19 1552    Visit Number  5    Number of Visits  13    Date for PT Re-Evaluation  07/16/19    Authorization Type  5    Authorization Time Period  of 10 Medicare    PT Start Time  1552    PT Stop Time  9150    PT Time Calculation (min)  46 min    Activity Tolerance  Patient tolerated treatment well    Behavior During Therapy  [facility] for tasks assessed/performed       Past Medical History:  Diagnosis Date  . 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 Anxiety   . Bipolar disorder ([place])    Dr. Thurmond Butts - every 3 months  . Broken foot    left   . Contact dermatitis and other eczema due to other specified agent   . Contusion of unspecified part of lower limb   . Essential hypertension, benign   . Heart murmur   . Other and unspecified hyperlipidemia   . Other specified disorders of thyroid   . Reflux esophagitis   . Tachycardia    a. isolated episode, seen in ED with negative work up    Past Surgical History:  Procedure Laterality Date  . CATARACT EXTRACTION    . SALPINGECTOMY Left   . STERILIZATION     Her decision since dz of bipolar    There were no vitals filed for this visit.  Subjective Assessment - 06/18/19 1553    Subjective  Pt was removing drawers from one room to the other. Was careful about it. No neck pain. Able to turn her head far to the L while driving. Better than she was last week. Not taking as much pain medicine.    Pertinent History  Neck and L foot pain. Pt went out the back door and hit her head on the roof of the stairway. Pt fell backwards slammed the L side of her face onto the  concrete floor. Had laceration on her head. Had stiches which were removed last Friday. Had hemorrhages L lateral head. Pt fell on April 08, 2019. No other falls beside that. Looking up has improved since injury. Cervical rotation level of comfort has stayed about the same.  Had PT before in which massage helped her L shoulder.  L foot pain began at the same day of her fall. Had bruising (L heel, dorsal foot, and toes)  and swelling L foot after injury.    Patient Stated Goals  Want the pain to go away and the swelling in her L foot to go away.    Currently in Pain?  No/denies    Pain Score  0-No pain                               PT Education - 06/18/19 1556    Education Details  ther-ex    Person(s) Educated  Patient    Methods  Explanation;Demonstration;Tactile cues;Verbal cues    Comprehension  Verbalized understanding;Returned demonstration       Objective  Medbridge Access codeAccess Code: [email or web address]  Manual therapy  Seated STM R levator scapulae and R rhomboid muscles           Seated STM L rhomboid muscle to decrease tension   Seated STM R and L cervical paraspinal muscles   Seated STM R first rib area  Improved comfort with L cervical rotation  Therapeutic exercise  Seated manually resisted scapular retraction targeting lower trap muscle             L 10x5 seconds for 3 sets             Decreased pain with L and R cervical rotation.    Seated L cervical side bend isometrics in neutral 10x with 5 second holds, manually resisted  Seated cervical nod isometrics, thumbs under chin 10x5 seconds for 3 sets   Reviewed HEP. Pt demonstrated and verbalized understanding.     Response to treatment Pt tolerated session well without aggravation of symptoms.   Clinical impression   Continued working on decreasing muscle tightness posterior cervical area to promote better mechanics with rotation. Pt improving ability to perform L  cervical rotation more comfortably based on subjective reports. Pt will benefit from continued skilled physical therapy services to decrease pain, muscle tension, improve posture, strength, and function.        PT Short Term Goals - 06/01/19 1720      PT SHORT TERM GOAL #1   Title  Pt will be independent with her HEP to decrease pain, improve strength and function.    Baseline  Pt has started her HEP. (06/01/2019)    Time  3    Period  Weeks    Status  New    Target Date  06/25/19        PT Long Term Goals - 06/01/19 1721      PT LONG TERM GOAL #1   Title  Pt will have a decrease in neck pain to 3/10 or less at worst to promote ability to drive and turn her head more comfortably.    Baseline  9/10 neck pain at worst for the past 2 weeks (06/01/2019)    Time  6    Period  Weeks    Status  New    Target Date  07/16/19      PT LONG TERM GOAL #2   Title  Patient will have a decrease in L foot and ankle pain to 2/10 or less at worst to promote ability to ambulate more comfortably.    Baseline  6/10 L foot pain at worst for the past 2 weeks. (06/01/2019)    Time  6    Period  Weeks    Status  New    Target Date  07/16/19      PT LONG TERM GOAL #3   Title  Patient will improve B hip extension and abduction strength by at least 1/2 MMT grade to promote ability to ambulate with less L foot and ankle pain/discomfort.    Time  6    Period  Weeks    Status  New    Target Date  07/16/19            Plan - 06/18/19 1614    Clinical Impression Statement  Continued working on decreasing muscle tightness posterior cervical area to promote better mechanics with rotation. Pt improving ability to perform L cervical more comfortably based on subjective reports. Pt will benefit from continued skilled physical therapy services to  decrease pain, muscle tension, improve posture, strength and function.    Personal Factors and Comorbidities  Age    Examination-Activity Limitations   Squat;Other   turning her head   Stability/Clinical Decision Making  Stable/Uncomplicated    Rehab Potential  Good    PT Frequency  2x / week    PT Duration  6 weeks    PT Treatment/Interventions  Manual techniques;Neuromuscular re-education;Therapeutic exercise;Therapeutic activities;Aquatic Therapy;Biofeedback;Canalith Repostioning;Electrical Stimulation;Iontophoresis 4mg /ml Dexamethasone;Traction;Ultrasound;Patient/family education;Dry needling;Spinal Manipulations;Joint Manipulations   traction, manipulation if appropriate   PT Next Visit Plan  anterior cervical, B scapular, B hip, trunk, ankle strengthening, manual techniques, modalities PRN.    Consulted and Agree with Plan of Care  Patient       Patient will benefit from skilled therapeutic intervention in order to improve the following deficits and impairments:  Pain, Postural dysfunction, Improper body mechanics, Difficulty walking, Decreased strength, Decreased range of motion  Visit Diagnosis: 1. Cervicalgia   2. Muscle weakness (generalized)        Problem List Patient Active Problem List   Diagnosis Date Noted  . Neck pain 05/06/2019  . Scalp laceration 04/13/2019  . Anemia 04/13/2019  . Hypothyroidism 02/28/2019  . Paroxysmal tachycardia ([place]) 10/14/2018  . Stress incontinence 04/01/2016  . Bradycardia 08/19/2015  . Bipolar disorder ([place]) 03/30/2015  . Anxiety   . Essential hypertension 10/06/2014  . Left foot pain 08/20/2014  . Chest pain with moderate risk for cardiac etiology 07/23/2014  . 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 Tachycardia 01/22/2014  . Smoking history 01/22/2014  . Hyperlipidemia 01/22/2014    Joneen Boers PT, DPT   06/18/2019, 4:53 PM  [facility] [address], [place], [address] Phone: [phone]   Fax:  [phone]  Name: Amy Mcconnell MRN: [id number] Date of Birth: September 08, 1951

## 2019-06-19 DIAGNOSIS — F3111 Bipolar disorder, current episode manic without psychotic features, mild: Secondary | ICD-10-CM | POA: Diagnosis not present

## 2019-06-22 ENCOUNTER — Other Ambulatory Visit: Payer: Self-pay

## 2019-06-22 ENCOUNTER — Encounter: Payer: Self-pay | Admitting: Physical Therapy

## 2019-06-22 ENCOUNTER — Ambulatory Visit: Payer: Medicare HMO | Admitting: Physical Therapy

## 2019-06-22 DIAGNOSIS — M6281 Muscle weakness (generalized): Secondary | ICD-10-CM | POA: Diagnosis not present

## 2019-06-22 DIAGNOSIS — M25572 Pain in left ankle and joints of left foot: Secondary | ICD-10-CM

## 2019-06-22 DIAGNOSIS — R262 Difficulty in walking, not elsewhere classified: Secondary | ICD-10-CM | POA: Diagnosis not present

## 2019-06-22 DIAGNOSIS — M542 Cervicalgia: Secondary | ICD-10-CM

## 2019-06-22 DIAGNOSIS — Z79899 Other long term (current) drug therapy: Secondary | ICD-10-CM | POA: Diagnosis not present

## 2019-06-22 NOTE — Therapy (Signed)
[facility] [address], [place], [address] Phone: [phone]   Fax:  [phone]  Physical Therapy Treatment  Patient Details  Name: Amy Mcconnell MRN: [id number] Date of Birth: [birth year] Referring Provider (PT): Tommi Rumps, MD   Encounter Date: 06/22/2019  PT End of Session - 06/22/19 1951    Visit Number  6    Number of Visits  13    Date for PT Re-Evaluation  07/16/19    Authorization Type  6    Authorization Time Period  of 10 Medicare    PT Start Time  9794    PT Stop Time  1425    PT Time Calculation (min)  40 min    Activity Tolerance  Patient tolerated treatment well    Behavior During Therapy  [facility] for tasks assessed/performed       Past Medical History:  Diagnosis Date  . 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 Anxiety   . Bipolar disorder ([place])    Dr. Thurmond Butts - every 3 months  . Broken foot    left   . Contact dermatitis and other eczema due to other specified agent   . Contusion of unspecified part of lower limb   . Essential hypertension, benign   . Heart murmur   . Other and unspecified hyperlipidemia   . Other specified disorders of thyroid   . Reflux esophagitis   . Tachycardia    a. isolated episode, seen in ED with negative work up    Past Surgical History:  Procedure Laterality Date  . CATARACT EXTRACTION    . SALPINGECTOMY Left   . STERILIZATION     Her decision since dz of bipolar    There were no vitals filed for this visit.  Subjective Assessment - 06/22/19 1350    Subjective  Patient reports 0/10 pain sitting doing nothing. When rotating neck L it hurts but much less than it did before (3/10). The pain is towards the R base of the neck. Ankle is still hurting at times but is much better. Has been doing HEP with no questions. Would like to work on neck again today.    Pertinent History  Neck and L foot pain. Pt went out the back door and hit her  head on the roof of the stairway. Pt fell backwards slammed the L side of her face onto the concrete floor. Had laceration on her head. Had stiches which were removed last Friday. Had hemorrhages L lateral head. Pt fell on April 08, 2019. No other falls beside that. Looking up has improved since injury. Cervical rotation level of comfort has stayed about the same.  Had PT before in which massage helped her L shoulder.  L foot pain began at the same day of her fall. Had bruising (L heel, dorsal foot, and toes)  and swelling L foot after injury.    Patient Stated Goals  Want the pain to go away and the swelling in her L foot to go away.    Currently in Pain?  Yes    Pain Score  3    when rotating      Objective    Medbridge Access codeAccess Code: [email or web address]  Manual therapy  Seated STM R levator scapulae and R rhomboid muscles  Tender at R levator distal attachment  Seated STM L rhomboid muscle to decrease tension  Seated STM R and L cervical paraspinal muscles  Tender and tight at R mid-cervical spine  Seated STM R first rib area              Seated STM to bilateral UT region  Therapeutic exercise  Seated manually resisted scapular retraction targeting lower trap muscle L 10x5 seconds for 3 sets  Seated repeated R side bend with self-guidance and gentle overpressure x 10  Mild end range pain, no significant effect following.   Seated repeated R rotation with UE for head guidance and gentle overpressure x 10,   improved R rotation and decreased pain.   Seated repeated L rotation with UE for head guidance and gentle overpressure x 10,  No significant effect  Reveiwed HEP including isometric head nod in sitting x 3.   Patient able to describe HEP in detail    Response to treatment Pt tolerated session well with some decreased symptoms by end of session, especially when turning to the R.    Clinical impression Continued working on  decreasing muscle tightness posterior cervical area to promote better mechanics with rotation. Repeated motions for possible relevant lateral component had no effect on L rotation or pain, but did improve R rotation and pain some. Patient continues to demo tightness and overactivity of several cervical and thoracic region muscles. Pt required multimodal cuing for proper technique and to facilitate improved neuromuscular control, strength, range of motion, and functional ability resulting in improved performance and form. Pt will benefit from continued skilled physical therapy services to decrease pain, muscle tension, improve posture, strength, and function.     PT Education - 06/22/19 1951    Education Details  exercise purpose and form. self-management techniques.    Person(s) Educated  Patient    Methods  Explanation;Demonstration;Tactile cues;Verbal cues    Comprehension  Verbalized understanding;Returned demonstration       PT Short Term Goals - 06/01/19 1720      PT SHORT TERM GOAL #1   Title  Pt will be independent with her HEP to decrease pain, improve strength and function.    Baseline  Pt has started her HEP. (06/01/2019)    Time  3    Period  Weeks    Status  New    Target Date  06/25/19        PT Long Term Goals - 06/01/19 1721      PT LONG TERM GOAL #1   Title  Pt will have a decrease in neck pain to 3/10 or less at worst to promote ability to drive and turn her head more comfortably.    Baseline  9/10 neck pain at worst for the past 2 weeks (06/01/2019)    Time  6    Period  Weeks    Status  New    Target Date  07/16/19      PT LONG TERM GOAL #2   Title  Patient will have a decrease in L foot and ankle pain to 2/10 or less at worst to promote ability to ambulate more comfortably.    Baseline  6/10 L foot pain at worst for the past 2 weeks. (06/01/2019)    Time  6    Period  Weeks    Status  New    Target Date  07/16/19      PT LONG TERM GOAL #3   Title   Patient will improve B hip extension and abduction strength by at least 1/2 MMT grade to promote ability to ambulate with less L foot and  ankle pain/discomfort.    Time  6    Period  Weeks    Status  New    Target Date  07/16/19            Plan - 06/22/19 1958    Clinical Impression Statement  Continued working on decreasing muscle tightness posterior cervical area to promote better mechanics with rotation. Repeated motions for possible relevant lateral component had no effect on L rotation or pain, but did improve R rotation and pain some. Patient continues to demo tightness and overactivity of several cervical and thoracic region muscles. Pt required multimodal cuing for proper technique and to facilitate improved neuromuscular control, strength, range of motion, and functional ability resulting in improved performance and form. Pt will benefit from continued skilled physical therapy services to decrease pain, muscle tension, improve posture, strength, and function.    Personal Factors and Comorbidities  Age    Examination-Activity Limitations  Squat;Other   turning her head   Stability/Clinical Decision Making  Stable/Uncomplicated    Rehab Potential  Good    PT Frequency  2x / week    PT Duration  6 weeks    PT Treatment/Interventions  Manual techniques;Neuromuscular re-education;Therapeutic exercise;Therapeutic activities;Aquatic Therapy;Biofeedback;Canalith Repostioning;Electrical Stimulation;Iontophoresis 4mg /ml Dexamethasone;Traction;Ultrasound;Patient/family education;Dry needling;Spinal Manipulations;Joint Manipulations   traction, manipulation if appropriate   PT Next Visit Plan  anterior cervical, B scapular, B hip, trunk, ankle strengthening, manual techniques, modalities PRN.    Consulted and Agree with Plan of Care  Patient       Patient will benefit from skilled therapeutic intervention in order to improve the following deficits and impairments:  Pain, Postural  dysfunction, Improper body mechanics, Difficulty walking, Decreased strength, Decreased range of motion  Visit Diagnosis: 1. Cervicalgia   2. Muscle weakness (generalized)   3. Pain in left ankle and joints of left foot   4. Difficulty in walking, not elsewhere classified        Problem List Patient Active Problem List   Diagnosis Date Noted  . Neck pain 05/06/2019  . Scalp laceration 04/13/2019  . Anemia 04/13/2019  . Hypothyroidism 02/28/2019  . Paroxysmal tachycardia ([place]) 10/14/2018  . Stress incontinence 04/01/2016  . Bradycardia 08/19/2015  . Bipolar disorder ([place]) 03/30/2015  . Anxiety   . Essential hypertension 10/06/2014  . Left foot pain 08/20/2014  . Chest pain with moderate risk for cardiac etiology 07/23/2014  . 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 Tachycardia 01/22/2014  . Smoking history 01/22/2014  . Hyperlipidemia 01/22/2014    Everlean Alstrom. Graylon Good, PT, DPT 06/22/19, 7:59 PM  [facility] [address], [place], [address] Phone: [phone]   Fax:  [phone]  Name: Amy Mcconnell MRN: [id number] Date of Birth: 27-Dec-1950

## 2019-06-24 ENCOUNTER — Ambulatory Visit: Payer: Medicare HMO

## 2019-06-29 ENCOUNTER — Ambulatory Visit: Payer: Medicare HMO

## 2019-07-03 ENCOUNTER — Other Ambulatory Visit: Payer: Self-pay | Admitting: Radiology

## 2019-07-03 DIAGNOSIS — Z853 Personal history of malignant neoplasm of breast: Secondary | ICD-10-CM

## 2019-07-03 DIAGNOSIS — R922 Inconclusive mammogram: Secondary | ICD-10-CM

## 2019-07-07 ENCOUNTER — Ambulatory Visit: Payer: Medicare HMO

## 2019-07-15 ENCOUNTER — Ambulatory Visit: Payer: Medicare HMO

## 2019-07-20 ENCOUNTER — Ambulatory Visit: Payer: Medicare HMO

## 2019-07-21 ENCOUNTER — Telehealth: Payer: Self-pay | Admitting: Cardiovascular Disease

## 2019-07-21 NOTE — Telephone Encounter (Signed)
Left voicemail message to call back  

## 2019-07-21 NOTE — Telephone Encounter (Signed)
Patient calling Patient has been taking calcium supplements but has read it can cause calcium deposits in arteries Patient would like to know doctors advice, please call

## 2019-07-22 ENCOUNTER — Ambulatory Visit: Payer: Medicare HMO | Attending: Family Medicine

## 2019-07-22 ENCOUNTER — Other Ambulatory Visit: Payer: Self-pay

## 2019-07-22 DIAGNOSIS — M25572 Pain in left ankle and joints of left foot: Secondary | ICD-10-CM

## 2019-07-22 DIAGNOSIS — M542 Cervicalgia: Secondary | ICD-10-CM | POA: Diagnosis not present

## 2019-07-22 DIAGNOSIS — M6281 Muscle weakness (generalized): Secondary | ICD-10-CM | POA: Diagnosis not present

## 2019-07-22 DIAGNOSIS — R262 Difficulty in walking, not elsewhere classified: Secondary | ICD-10-CM

## 2019-07-22 NOTE — Patient Instructions (Signed)
Access Code: [email or web address]  URL: [email or web address]  Date: 07/22/2019  Prepared by: Joneen Boers   Exercises Gastroc Stretch on Wall - 3 reps - 1 sets - 30 seconds hold - 2-3x daily - 7x weekly Supine Head Nod Deep Neck Flexor Training - 10 reps - 3 sets - 2-3x daily - 7x weekly Supine Chin Tuck - 10 reps - 3 sets - 5 seconds hold - 1x daily - 7x weekly Seated Scapular Retraction - 10 reps - 3 sets - 5 seconds hold - 1x daily - 7x weekly Supine Scapular Protraction in Flexion with Dumbbells - 10 reps - 3 sets - 5 second holds hold - 1x daily - 7x weekly Scapular Retraction with Resistance - 10 reps - 3 sets - 5 seconds hold - 1x daily - 7x weekly

## 2019-07-22 NOTE — Therapy (Signed)
[facility] [address], [place], [address] Phone: [phone]   Fax:  [phone]  Physical Therapy Treatment  Patient Details  Name: Amy Mcconnell MRN: [id number] Date of Birth: [birth year] Referring Provider (PT): Tommi Rumps, MD   Encounter Date: 07/22/2019  PT End of Session - 07/22/19 1621    Visit Number  7    Number of Visits  13    Date for PT Re-Evaluation  07/30/19    Authorization Type  7    Authorization Time Period  of 10 Medicare    PT Start Time  1621    PT Stop Time  1701    PT Time Calculation (min)  40 min    Activity Tolerance  Patient tolerated treatment well    Behavior During Therapy  [facility] for tasks assessed/performed       Past Medical History:  Diagnosis Date  . 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 Anxiety   . Bipolar disorder ([place])    Dr. Thurmond Butts - every 3 months  . Broken foot    left   . Contact dermatitis and other eczema due to other specified agent   . Contusion of unspecified part of lower limb   . Essential hypertension, benign   . Heart murmur   . Other and unspecified hyperlipidemia   . Other specified disorders of thyroid   . Reflux esophagitis   . Tachycardia    a. isolated episode, seen in ED with negative work up    Past Surgical History:  Procedure Laterality Date  . CATARACT EXTRACTION    . SALPINGECTOMY Left   . STERILIZATION     Her decision since dz of bipolar    There were no vitals filed for this visit.  Subjective Assessment - 07/22/19 1622    Subjective  Picked up 12 half bucket full of pairs which helped her neck. Has not driven in a week. Felt just a little twinge on her neck when looking to the L when driving. The pain is no where near to where it was. No neck pain currently.  3/10 neck pain at most for the past 7 days. The home exercises have helped. L foot is still a little twingy but when she does her exercises, it feels  better. 1-2/10 at most for the past 7 days. Feels like she is good to go with the exercises at home after today's session if she does well when she gets home.    Pertinent History  Neck and L foot pain. Pt went out the back door and hit her head on the roof of the stairway. Pt fell backwards slammed the L side of her face onto the concrete floor. Had laceration on her head. Had stiches which were removed last Friday. Had hemorrhages L lateral head. Pt fell on April 08, 2019. No other falls beside that. Looking up has improved since injury. Cervical rotation level of comfort has stayed about the same.  Had PT before in which massage helped her L shoulder.  L foot pain began at the same day of her fall. Had bruising (L heel, dorsal foot, and toes)  and swelling L foot after injury.    Patient Stated Goals  Want the pain to go away and the swelling in her L foot to go away.    Currently in Pain?  No/denies    Pain Score  0-No pain  [facility] PT Assessment - 07/22/19 1628      Strength   Right Hip Extension  4/5    Right Hip ABduction  4+/5    Left Hip Extension  4/5    Left Hip ABduction  4/5                           PT Education - 07/22/19 1827    Education Details  ther-ex, HEP, plan of care    Person(s) Educated  Patient    Methods  Explanation;Demonstration;Tactile cues;Verbal cues;Handout    Comprehension  Returned demonstration;Verbalized understanding      Objective    Medbridge Access codeAccess Code: [email or web address]    Therapeutic exercise  Prone manually resisted hip extension, manually resisted S/L hip abduction 1x each way for each LE  Reviewed progress with hip strength with pt.   L scapular retraction with L cervical rotation  Decreased R upper neck pain  Standing L shoulder red band scapular retraction 10x3  Supine serratus reach 3 lbs 10x3 with 5 second holds  Decreased neck pain with L cervical rotation  Reviewed current HEP. Pt  demonstrated and verbalized understanding, handout provided.   Reviewed plan of care: pt to perform HEP for 1 week and return for a follow up session to assess progress with HEP.       Improved exercise technique, movement at target joints, use of target muscles after mod verbal, visual, tactile cues.    Manual therapy  Seated STM R rhomboid muscle   Decreased neck pain to 0/10 with L cervical rotation    Response to treatment No neck pain with L cervical rotation after session.    Clinical impression Pt demonstrates significant decrease in neck and L foot and ankle pain as well as improved bilateral hip extension and abduction strength since initial evaluation. Pt is making very good progress with PT towards goals. Pt to continue with HEP x 1 week and return for follow up to assess progress. Pt will benefit from continued skilled physical therapy services to decrease pain, improve strength and function.     PT Short Term Goals - 07/22/19 1828      PT SHORT TERM GOAL #1   Title  Pt will be independent with her HEP to decrease pain, improve strength and function.    Baseline  Pt has started her HEP. (06/01/2019); pt demonstrates indepencence and consistency with performing her HEP based on subjective reports (07/22/2019)    Time  3    Period  Weeks    Status  Achieved    Target Date  06/25/19        PT Long Term Goals - 07/22/19 1829      PT LONG TERM GOAL #1   Title  Pt will have a decrease in neck pain to 3/10 or less at worst to promote ability to drive and turn her head more comfortably.    Baseline  9/10 neck pain at worst for the past 2 weeks (06/01/2019); 3/10 (07/22/2019)    Time  1    Period  Weeks    Status  Partially Met    Target Date  07/30/19      PT LONG TERM GOAL #2   Title  Patient will have a decrease in L foot and ankle pain to 2/10 or less at worst to promote ability to ambulate more comfortably.    Baseline  6/10 L foot pain at worst for  the  past 2 weeks. (06/01/2019); 1-2/10 (07/22/2019)    Time  6    Period  Weeks    Status  Achieved    Target Date  07/16/19      PT LONG TERM GOAL #3   Title  Patient will improve B hip extension and abduction strength by at least 1/2 MMT grade to promote ability to ambulate with less L foot and ankle pain/discomfort.    Time  6    Period  Weeks    Status  Achieved    Target Date  07/16/19            Plan - 07/22/19 1828    Clinical Impression Statement  Pt demonstrates significant decrease in neck and L foot and ankle pain as well as improved bilateral hip extension and abduction strength since initial evaluation. Pt is making very good progress with PT towards goals. Pt to continue with HEP x 1 week and return for follow up to assess progress. Pt will benefit from continued skilled physical therapy services to decrease pain, improve strength and function.    Personal Factors and Comorbidities  Age    Examination-Activity Limitations  Squat;Other   turning her head   Stability/Clinical Decision Making  Stable/Uncomplicated    Clinical Decision Making  Low    Rehab Potential  Good    PT Frequency  One time visit    PT Treatment/Interventions  Manual techniques;Neuromuscular re-education;Therapeutic exercise;Therapeutic activities;Aquatic Therapy;Biofeedback;Canalith Repostioning;Electrical Stimulation;Iontophoresis 97m/ml Dexamethasone;Traction;Ultrasound;Patient/family education;Dry needling;Spinal Manipulations;Joint Manipulations   traction, manipulation if appropriate   PT Next Visit Plan  anterior cervical, B scapular, B hip, trunk, ankle strengthening, manual techniques, modalities PRN.    Consulted and Agree with Plan of Care  Patient       Patient will benefit from skilled therapeutic intervention in order to improve the following deficits and impairments:  Pain, Postural dysfunction, Improper body mechanics, Difficulty walking, Decreased strength, Decreased range of  motion  Visit Diagnosis: Cervicalgia - Plan: PT plan of care cert/re-cert  Muscle weakness (generalized) - Plan: PT plan of care cert/re-cert  Pain in left ankle and joints of left foot - Plan: PT plan of care cert/re-cert  Difficulty in walking, not elsewhere classified - Plan: PT plan of care cert/re-cert     Problem List Patient Active Problem List   Diagnosis Date Noted  . Neck pain 05/06/2019  . Scalp laceration 04/13/2019  . Anemia 04/13/2019  . Hypothyroidism 02/28/2019  . [place] 10/14/2018  . Stress incontinence 04/01/2016  . Bradycardia 08/19/2015  . [place] 03/30/2015  . Anxiety   . Essential hypertension 10/06/2014  . Left foot pain 08/20/2014  . Chest pain with moderate risk for cardiac etiology 07/23/2014  . 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 Tachycardia 01/22/2014  . Smoking history 01/22/2014  . Hyperlipidemia 01/22/2014    MJoneen BoersPT, DPT   07/22/2019, 6:39 PM  [facility] [address] [place] [address]Phone: [phone]  Fax:  [phone] Name: BCHEVI LIMMRN: [id number] of Birth: 102/19/1952

## 2019-07-23 NOTE — Telephone Encounter (Signed)
Spoke with patient and reviewed that it is safe to take and it should not cause any buildup. She was relieved to hear that and was thankful for the call back. She had no further concerns and no other questions for now.

## 2019-07-27 ENCOUNTER — Ambulatory Visit: Payer: Medicare HMO

## 2019-07-29 ENCOUNTER — Other Ambulatory Visit: Payer: Self-pay

## 2019-07-29 ENCOUNTER — Ambulatory Visit: Payer: Medicare HMO

## 2019-07-29 DIAGNOSIS — R262 Difficulty in walking, not elsewhere classified: Secondary | ICD-10-CM | POA: Diagnosis not present

## 2019-07-29 DIAGNOSIS — M6281 Muscle weakness (generalized): Secondary | ICD-10-CM | POA: Diagnosis not present

## 2019-07-29 DIAGNOSIS — M25572 Pain in left ankle and joints of left foot: Secondary | ICD-10-CM

## 2019-07-29 DIAGNOSIS — M542 Cervicalgia: Secondary | ICD-10-CM

## 2019-07-29 NOTE — Therapy (Signed)
[facility] [address], [place], [address] Phone: [phone]   Fax:  [phone]  Physical Therapy Treatment And Discharge Summary  Patient Details  Name: Amy Mcconnell MRN: [id number] Date of Birth: February 17, 1951 Referring Provider (PT): Tommi Rumps, MD   Encounter Date: 07/29/2019  PT End of Session - 07/29/19 1404    Visit Number  8    Number of Visits  13    Date for PT Re-Evaluation  07/30/19    Authorization Type  8    Authorization Time Period  of 10 Medicare    PT Start Time  6945    PT Stop Time  1434    PT Time Calculation (min)  29 min    Activity Tolerance  Patient tolerated treatment well    Behavior During Therapy  [facility] for tasks assessed/performed       Past Medical History:  Diagnosis Date  . 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 Anxiety   . Bipolar disorder ([place])    Dr. Thurmond Butts - every 3 months  . Broken foot    left   . Contact dermatitis and other eczema due to other specified agent   . Contusion of unspecified part of lower limb   . Essential hypertension, benign   . Heart murmur   . Other and unspecified hyperlipidemia   . Other specified disorders of thyroid   . Reflux esophagitis   . Tachycardia    a. isolated episode, seen in ED with negative work up    Past Surgical History:  Procedure Laterality Date  . CATARACT EXTRACTION    . SALPINGECTOMY Left   . STERILIZATION     Her decision since dz of bipolar    There were no vitals filed for this visit.  Subjective Assessment - 07/29/19 1405    Subjective  Neck is good. Picks up mulch properly. Can turn her head while turning her head. Still just a tiny bit pressure R lateral neck but not pain.  Feels like she can continue her HEP at home after today.    Pertinent History  Neck and L foot pain. Pt went out the back door and hit her head on the roof of the stairway. Pt fell backwards slammed the L side of  her face onto the concrete floor. Had laceration on her head. Had stiches which were removed last Friday. Had hemorrhages L lateral head. Pt fell on April 08, 2019. No other falls beside that. Looking up has improved since injury. Cervical rotation level of comfort has stayed about the same.  Had PT before in which massage helped her L shoulder.  L foot pain began at the same day of her fall. Had bruising (L heel, dorsal foot, and toes)  and swelling L foot after injury.    Patient Stated Goals  Want the pain to go away and the swelling in her L foot to go away.    Currently in Pain?  No/denies    Pain Score  0-No pain                               PT Education - 07/29/19 1411    Education Details  ther-ex, HEP    Person(s) Educated  Patient    Methods  Explanation;Demonstration;Tactile cues;Verbal cues;Handout    Comprehension  Returned demonstration;Verbalized understanding  Objective    Medbridge Access codeAccess Code: [email or web address]  HEP cervical flexion isometrics   Therapeutic exercise  Reviewed some HEP with pt. Pt demonstrated and verbalized understanding  Seated cervical flexion isometrics 10x3 with 5 second holds   Seated manually resisted L serratus reach, shoulder flexed at 90 degrees 10x3 with 5 second holds  Decreased neck pressure   Improved exercise technique, movement at target joints, use of target muscles after min verbal, visual, tactile cues.      Manual therapy  Seated STM R rhomboid muscle        Seated STM B cervical paraspinal muscles    Response to treatment Pt tolerated session well without aggravation of symptoms.    Clinical impression Pt demonstrates significant decrease in neck and L foot pain, improved ability to turn her head while driving with less pain, improved hip strength and ability to perform tasks such as driving and garden work more comfortably since initial evaluation. Pt has made  very good progress with PT towards goals. Skilled physical therapy services discharged with pt continuing with her HEP.      PT Short Term Goals - 07/22/19 1828      PT SHORT TERM GOAL #1   Title  Pt will be independent with her HEP to decrease pain, improve strength and function.    Baseline  Pt has started her HEP. (06/01/2019); pt demonstrates indepencence and consistency with performing her HEP based on subjective reports (07/22/2019)    Time  3    Period  Weeks    Status  Achieved    Target Date  06/25/19        PT Long Term Goals - 07/29/19 1408      PT LONG TERM GOAL #1   Title  Pt will have a decrease in neck pain to 3/10 or less at worst to promote ability to drive and turn her head more comfortably.    Baseline  9/10 neck pain at worst for the past 2 weeks (06/01/2019); 3/10 (07/22/2019) and (07/29/2019)    Time  1    Period  Weeks    Status  Achieved    Target Date  07/30/19      PT LONG TERM GOAL #2   Title  Patient will have a decrease in L foot and ankle pain to 2/10 or less at worst to promote ability to ambulate more comfortably.    Baseline  6/10 L foot pain at worst for the past 2 weeks. (06/01/2019); 1-2/10 (07/22/2019); 3/10 at most for the past 7 days (07/29/2019)    Time  6    Period  Weeks    Status  Partially Met    Target Date  07/30/19      PT LONG TERM GOAL #3   Title  Patient will improve B hip extension and abduction strength by at least 1/2 MMT grade to promote ability to ambulate with less L foot and ankle pain/discomfort.    Time  6    Period  Weeks    Status  Achieved    Target Date  07/30/19            Plan - 07/29/19 1403    Clinical Impression Statement  Pt demonstrates significant decrease in neck and L foot pain, improved ability to turn her head while driving with less pain, improved hip strength and ability to perform tasks such as driving and garden work more comfortably since initial evaluation. Pt has made very good  progress with  PT towards goals. Skilled physical therapy services discharged with pt continuing with her HEP.    Personal Factors and Comorbidities  Age    Examination-Activity Limitations  Squat;Other   turning her head   Stability/Clinical Decision Making  Stable/Uncomplicated    Rehab Potential  Good    PT Frequency  One time visit    PT Treatment/Interventions  Manual techniques;Neuromuscular re-education;Therapeutic exercise;Therapeutic activities;Aquatic Therapy;Biofeedback;Canalith Repostioning;Electrical Stimulation;Iontophoresis 39m/ml Dexamethasone;Traction;Ultrasound;Patient/family education;Dry needling;Spinal Manipulations;Joint Manipulations   traction, manipulation if appropriate   PT Next Visit Plan  anterior cervical, B scapular, B hip, trunk, ankle strengthening, manual techniques, modalities PRN.    Consulted and Agree with Plan of Care  Patient       Patient will benefit from skilled therapeutic intervention in order to improve the following deficits and impairments:  Pain, Postural dysfunction, Improper body mechanics, Difficulty walking, Decreased strength, Decreased range of motion  Visit Diagnosis: Cervicalgia  Muscle weakness (generalized)  Pain in left ankle and joints of left foot  Difficulty in walking, not elsewhere classified     Problem List Patient Active Problem List   Diagnosis Date Noted  . Neck pain 05/06/2019  . Scalp laceration 04/13/2019  . Anemia 04/13/2019  . Hypothyroidism 02/28/2019  . [place]ox[place]mal tachycardia ([place] 10/14/2018  . Stress incontinence 04/01/2016  . Bradycardia 08/19/2015  . Bipolar disorder ([place] 03/30/2015  . Anxiety   . Essential hypertension 10/06/2014  . Left foot pain 08/20/2014  . Chest pain with moderate risk for cardiac etiology 07/23/2014  . 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 Tachycardia 01/22/2014  . Smoking history 01/22/2014  . Hyperlipidemia 01/22/2014    Thank you for your referral.   MJoneen BoersPT,  DPT   07/29/2019, 2:47 PM  [facility] [address] [place] [address]Phone: [phone]  Fax:  [phone] Name: Amy BRYARSMRN: [id number] of Birth: [birth year]

## 2019-07-29 NOTE — Patient Instructions (Addendum)
Cervical Flexion Isometrics               With head at neutral, gently press your chin against your thumbs to feel the muscles in front of your neck activate.               Hold for 5 seconds              Repeat 10 times              Perform 3 sets daily     Access Code: [email or web address]  URL: [email or web address]  Date: 07/29/2019  Prepared by: Joneen Boers   Exercises Gastroc Stretch on Wall - 3 reps - 1 sets - 30 seconds hold - 2-3x daily - 7x weekly Supine Head Nod Deep Neck Flexor Training - 10 reps - 3 sets - 2-3x daily - 7x weekly Supine Chin Tuck - 10 reps - 3 sets - 5 seconds hold - 1x daily - 7x weekly Seated Scapular Retraction - 10 reps - 3 sets - 5 seconds hold - 1x daily - 7x weekly Supine Scapular Protraction in Flexion with Dumbbells - 10 reps - 3 sets - 5 second holds hold - 1x daily - 7x weekly Scapular Retraction with Resistance - 10 reps - 3 sets - 5 seconds hold - 1x daily - 7x weekly

## 2019-08-05 ENCOUNTER — Other Ambulatory Visit: Payer: Self-pay

## 2019-08-07 ENCOUNTER — Encounter: Payer: Self-pay | Admitting: Family Medicine

## 2019-08-07 ENCOUNTER — Ambulatory Visit (INDEPENDENT_AMBULATORY_CARE_PROVIDER_SITE_OTHER): Payer: Medicare HMO | Admitting: Family Medicine

## 2019-08-07 ENCOUNTER — Other Ambulatory Visit: Payer: Self-pay

## 2019-08-07 ENCOUNTER — Telehealth: Payer: Self-pay | Admitting: Family Medicine

## 2019-08-07 VITALS — BP 140/80 | HR 67 | Temp 96.6°F | Ht 67.5 in | Wt 149.4 lb

## 2019-08-07 DIAGNOSIS — R7989 Other specified abnormal findings of blood chemistry: Secondary | ICD-10-CM | POA: Diagnosis not present

## 2019-08-07 DIAGNOSIS — E039 Hypothyroidism, unspecified: Secondary | ICD-10-CM | POA: Diagnosis not present

## 2019-08-07 DIAGNOSIS — I10 Essential (primary) hypertension: Secondary | ICD-10-CM | POA: Diagnosis not present

## 2019-08-07 DIAGNOSIS — D649 Anemia, unspecified: Secondary | ICD-10-CM

## 2019-08-07 DIAGNOSIS — S0101XA Laceration without foreign body of scalp, initial encounter: Secondary | ICD-10-CM | POA: Diagnosis not present

## 2019-08-07 LAB — CBC
HCT: 41.7 % (ref 36.0–46.0)
Hemoglobin: 13.5 g/dL (ref 12.0–15.0)
MCHC: 32.5 g/dL (ref 30.0–36.0)
MCV: 85.1 fl (ref 78.0–100.0)
Platelets: 196 10*3/uL (ref 150.0–400.0)
RBC: 4.9 Mil/uL (ref 3.87–5.11)
RDW: 14.1 % (ref 11.5–15.5)
WBC: 5.9 10*3/uL (ref 4.0–10.5)

## 2019-08-07 LAB — COMPREHENSIVE METABOLIC PANEL
ALT: 34 U/L (ref 0–35)
AST: 32 U/L (ref 0–37)
Albumin: 4.6 g/dL (ref 3.5–5.2)
Alkaline Phosphatase: 103 U/L (ref 39–117)
BUN: 21 mg/dL (ref 6–23)
CO2: 27 mEq/L (ref 19–32)
Calcium: 10.2 mg/dL (ref 8.4–10.5)
Chloride: 106 mEq/L (ref 96–112)
Creatinine, Ser: 0.83 mg/dL (ref 0.40–1.20)
GFR: 68.21 mL/min (ref >60.00)
Glucose, Bld: 90 mg/dL (ref 70–99)
Potassium: 4.2 mEq/L (ref 3.5–5.1)
Sodium: 141 mEq/L (ref 135–145)
Total Bilirubin: 0.5 mg/dL (ref 0.2–1.2)
Total Protein: 7.8 g/dL (ref 6.0–8.3)

## 2019-08-07 LAB — TSH: TSH: 3.78 u[IU]/mL (ref 0.35–4.50)

## 2019-08-07 NOTE — Patient Instructions (Signed)
Nice to see. We will get lab work today and contact you with results.

## 2019-08-07 NOTE — Assessment & Plan Note (Signed)
Check TSH 

## 2019-08-07 NOTE — Assessment & Plan Note (Signed)
Plan to recheck today.

## 2019-08-07 NOTE — Assessment & Plan Note (Signed)
Recheck CBC. 

## 2019-08-07 NOTE — Telephone Encounter (Signed)
Patient was seen this morning stated blood pressure was elevated. She wanted Dr. Caryl Bis to know she is no longer taking melatonin. She thinks this is the reason blood pressure was high.

## 2019-08-07 NOTE — Assessment & Plan Note (Signed)
Well-controlled at home.  She will continue her current regimen.

## 2019-08-07 NOTE — Assessment & Plan Note (Signed)
Well-healed.  She will continue to monitor.  Discussed that it may take some time for the discomfort and numbness to improve or resolve.

## 2019-08-07 NOTE — Progress Notes (Signed)
  Tommi Rumps, MD Phone: [phone]  Amy Mcconnell is a 68 y.o. female who presents today for follow-up.  Scalp laceration: Notes this is healing quite well.  She does occasionally have pain in her forehead though that has been improving.  She does have some numbness at the scalp though no other numbness elsewhere.  No bleeding.  No vision changes, numbness, or weakness.  Hypertension: Typically running 130/65-70.  Taking amlodipine, doxazosin, propranolol.  No chest pain, shortness of breath, or edema.  Hyperlipidemia: Taking Lipitor.  No right upper quadrant pain or myalgias.  Elevated LFTs: No alcohol intake.  Not much Tylenol intake.  Patient requests to have a lithium level checked.  She notes she takes lithium twice daily.  She notes this was last checked by her psychiatrist 1 month ago.  She notes they typically check it every 6 months.  Social History   Tobacco Use  Smoking Status Former Smoker  . Packs/day: 0.50  . Years: 13.00  . Pack years: 6.50  . Quit date: 01/23/2000  . Years since quitting: 19.5  Smokeless Tobacco Never Used     ROS see history of present illness  Objective  Physical Exam Vitals:   08/07/19 1000 08/07/19 1033  BP: 140/90 140/80  Pulse: 67   Temp: (!) 96.6 F (35.9 C)   SpO2: 98%     BP Readings from Last 3 Encounters:  08/07/19 140/80  05/17/19 106/77  04/14/19 109/61   Wt Readings from Last 3 Encounters:  08/07/19 149 lb 6.4 oz (67.8 kg)  04/14/19 148 lb (67.1 kg)  04/13/19 148 lb 9.6 oz (67.4 kg)    Physical Exam Constitutional:      General: She is not in acute distress.    Appearance: She is not diaphoretic.  HENT:     Head:     Comments: Well-healed laceration over scalp Cardiovascular:     Rate and Rhythm: Normal rate and regular rhythm.     Heart sounds: Normal heart sounds.  Pulmonary:     Effort: Pulmonary effort is normal.     Breath sounds: Normal breath sounds.  Abdominal:     General: Bowel sounds  are normal. There is no distension.     Palpations: Abdomen is soft.     Tenderness: There is no abdominal tenderness. There is no guarding or rebound.  Musculoskeletal:     Right lower leg: No edema.     Left lower leg: No edema.  Skin:    General: Skin is warm and dry.  Neurological:     Mental Status: She is alert.      Assessment/Plan: Please see individual problem list.  Essential hypertension Well-controlled at home.  She will continue her current regimen.  Hypothyroidism Check TSH.  Anemia Recheck CBC.  Scalp laceration Well-healed.  She will continue to monitor.  Discussed that it may take some time for the discomfort and numbness to improve or resolve.  Elevated LFTs Plan to recheck today.   Orders Placed This Encounter  Procedures  . Comp Met (CMET)  . CBC  . TSH    No orders of the defined types were placed in this encounter.    Tommi Rumps, MD [place]

## 2019-08-07 NOTE — Telephone Encounter (Signed)
Patient was seen this morning stated blood pressure was elevated. She wanted Dr. Caryl Bis to know she is no longer taking melatonin. She thinks this is the reason blood pressure was high.  Amy Mcconnell,cma

## 2019-08-08 ENCOUNTER — Encounter: Payer: Self-pay | Admitting: Family Medicine

## 2019-08-08 NOTE — Telephone Encounter (Signed)
Noted. I would not think that stopping melatonin would cause an increase in her BP. Her BP was ok in the office.

## 2019-08-09 ENCOUNTER — Other Ambulatory Visit: Payer: Self-pay | Admitting: Cardiovascular Disease

## 2019-08-11 ENCOUNTER — Telehealth: Payer: Self-pay

## 2019-08-11 NOTE — Telephone Encounter (Signed)
A  Letter was mailed out today to patient with lab results. Alitza Cowman,cma

## 2019-09-11 DIAGNOSIS — F3111 Bipolar disorder, current episode manic without psychotic features, mild: Secondary | ICD-10-CM | POA: Diagnosis not present

## 2019-09-16 DIAGNOSIS — Z79899 Other long term (current) drug therapy: Secondary | ICD-10-CM | POA: Diagnosis not present

## 2019-10-06 ENCOUNTER — Telehealth: Payer: Self-pay | Admitting: *Deleted

## 2019-10-06 NOTE — Telephone Encounter (Signed)
Copied from [name]. Topic: General - Inquiry >> Oct 06, 2019  5:04 PM Amy Mcconnell wrote: Reason for CRM: Patient has received a message for refill for Diabetes and pt does not have that . Must have been done in error . Please advise

## 2019-10-07 NOTE — Telephone Encounter (Signed)
Patient advised not to give any information.

## 2019-10-12 ENCOUNTER — Other Ambulatory Visit: Payer: Self-pay | Admitting: Cardiovascular Disease

## 2019-11-02 ENCOUNTER — Other Ambulatory Visit: Payer: Self-pay | Admitting: Cardiovascular Disease

## 2019-11-08 ENCOUNTER — Other Ambulatory Visit: Payer: Self-pay | Admitting: Cardiovascular Disease

## 2019-11-10 ENCOUNTER — Encounter (INDEPENDENT_AMBULATORY_CARE_PROVIDER_SITE_OTHER): Payer: Self-pay

## 2019-11-10 ENCOUNTER — Ambulatory Visit (INDEPENDENT_AMBULATORY_CARE_PROVIDER_SITE_OTHER): Payer: Medicare HMO

## 2019-11-10 ENCOUNTER — Other Ambulatory Visit: Payer: Self-pay

## 2019-11-10 VITALS — BP 119/80 | Ht 67.5 in | Wt 149.0 lb

## 2019-11-10 DIAGNOSIS — Z0000 Encounter for general adult medical examination without abnormal findings: Secondary | ICD-10-CM

## 2019-11-10 NOTE — Progress Notes (Signed)
Subjective:   Amy Mcconnell is a 69 y.o. female who presents for Medicare Annual (Subsequent) preventive examination.  Review of Systems:  No ROS.  Medicare Wellness Virtual Visit.  Visual/audio telehealth visit, UTA vital signs.   See social history for additional risk factors.   Cardiac Risk Factors include: advanced age (>9men, >21 women);hypertension     Objective:     Vitals: BP 119/80 (BP Location: Left Arm, Patient Position: Sitting, Cuff Size: Normal)   Ht 5' 7.5" (1.715 m)   Wt 149 lb (67.6 kg)   BMI 22.99 kg/m   Body mass index is 22.99 kg/m.  Advanced Directives 11/10/2019 05/17/2019 04/14/2019 04/09/2019 11/07/2018 11/06/2017 10/22/2016  Does Patient Have a Medical Advance Directive? Yes No No No No No No  Type of Paramedic of [place];Living will - - - - - -  Does patient want to make changes to medical advance directive? No - Patient declined - - - - - -  Copy of [place] in Chart? No - copy requested - - - - - -  Would patient like information on creating a medical advance directive? - No - Patient declined No - Patient declined No - Patient declined No - Patient declined Yes (MAU/Ambulatory/Procedural Areas - Information given) No - Patient declined    Tobacco Social History   Tobacco Use  Smoking Status Former Smoker  . Packs/day: 0.50  . Years: 13.00  . Pack years: 6.50  . Quit date: 01/23/2000  . Years since quitting: 19.8  Smokeless Tobacco Never Used     Counseling given: Not Answered   Clinical Intake:  Pre-visit preparation completed: Yes        Diabetes: No  How often do you need to have someone help you when you read instructions, pamphlets, or other written materials from your doctor or pharmacy?: 1 - Never  Interpreter Needed?: No     Past Medical History:  Diagnosis Date  . 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 Anxiety   . Bipolar disorder ([place])    Dr. Thurmond Butts - every 3 months   . Broken foot    left   . Contact dermatitis and other eczema due to other specified agent   . Contusion of unspecified part of lower limb   . Essential hypertension, benign   . Heart murmur   . Other and unspecified hyperlipidemia   . Other specified disorders of thyroid   . Reflux esophagitis   . Tachycardia    a. isolated episode, seen in ED with negative work up   Past Surgical History:  Procedure Laterality Date  . CATARACT EXTRACTION    . SALPINGECTOMY Left   . STERILIZATION     Her decision since dz of bipolar   Family History  Problem Relation Age of Onset  . Arrhythmia Mother        A-Fib  . Heart failure Mother   . Hyperlipidemia Mother   . Hypertension Mother   . Alzheimer's disease Mother   . Hypertension Father   . Hyperlipidemia Father   . Mental retardation Father 26       Suicide  . Hypertension Brother   . Hyperlipidemia Brother   . Hypertension Brother   . Hyperlipidemia Brother   . Hyperlipidemia Brother   . Hypertension Brother   . Alcohol abuse Brother   . Depression Brother   . Hyperlipidemia Brother   . Hypertension Brother   . Breast  cancer Maternal Aunt    Social History   Socioeconomic History  . Marital status: Divorced    Spouse name: Not on file  . Number of children: 0  . Years of education: 39  . Highest education level: Not on file  Occupational History  . Not on file  Tobacco Use  . Smoking status: Former Smoker    Packs/day: 0.50    Years: 13.00    Pack years: 6.50    Quit date: 01/23/2000    Years since quitting: 19.8  . Smokeless tobacco: Never Used  Substance and Sexual Activity  . Alcohol use: No  . Drug use: No  . Sexual activity: Never  Other Topics Concern  . Not on file  Social History Narrative   Ms. Degregory grew up in [place], [place]. She lives in [place]. Ms. Cowdrey is divorced. She has 2 cats (Charlie and Angie). 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 She also volunteers 2 days at [facility].  She enjoys reading, garding and cooking.   Social Determinants of Health   Financial Resource Strain: Low Risk   . Difficulty of Paying Living Expenses: Not hard at all  Food Insecurity: No Food Insecurity  . Worried About Charity fundraiser in the Last Year: Never true  . Ran Out of Food in the Last Year: Never true  Transportation Needs: No Transportation Needs  . Lack of Transportation (Medical): No  . Lack of Transportation (Non-Medical): No  Physical Activity: Sufficiently Active  . Days of Exercise per Week: 5 days  . Minutes of Exercise per Session: 30 min  Stress: No Stress Concern Present  . Feeling of Stress : Not at all  Social Connections: Somewhat Isolated  . Frequency of Communication with Friends and Family: More than three times a week  . Frequency of Social Gatherings with Friends and Family: Once a week  . Attends Religious Services: Never  . Active Member of Clubs or Organizations: Yes  . Attends Archivist Meetings: Never  . Marital Status: Divorced    Outpatient Encounter Medications as of 11/10/2019  Medication Sig  . acetaminophen (TYLENOL) 500 MG tablet Take 1,000 mg by mouth every 6 (six) hours as needed.  Marland Kitchen amLODipine (NORVASC) 5 MG tablet Take 1 tablet by mouth once daily  . Ascorbic Acid (VITAMIN C) 1000 MG tablet Take 1,000 mg by mouth daily.  Marland Kitchen aspirin 81 MG tablet Take 81 mg by mouth daily.  Marland Kitchen atorvastatin (LIPITOR) 80 MG tablet Take 1 tablet by mouth once daily  . B Complex Vitamins (VITAMIN B COMPLEX PO) Take by mouth daily.  . calcium carbonate (OS-CAL) 600 MG TABS tablet Take 600 mg by mouth daily.   . Cholecalciferol (VITAMIN D3) 50 MCG (2000 UT) capsule Take 2,000 Units by mouth daily.  . diphenhydrAMINE (BENADRYL) 25 mg capsule Take 25 mg by mouth daily as needed for allergies. For food and fragrance sensitivities  . doxazosin (CARDURA) 8 MG tablet TAKE 1 TABLET BY MOUTH TWICE DAILY (CONTACT  OFFICE  TO  SCHEDULE  FUTURE  APPOINTMENT   AND  REFILLS)  . fexofenadine (ALLEGRA) 180 MG tablet Take 180 mg by mouth. 2-3 times daily prn For food and fragrance sensitivities/intolerances  . Flaxseed, Linseed, (FLAXSEED OIL PO) Take by mouth.  . levothyroxine (SYNTHROID, LEVOTHROID) 125 MCG tablet Take 125 mcg by mouth daily before breakfast.  . lithium carbonate 150 MG capsule Take 150 mg by mouth 2 (two) times daily with a  meal.  . magnesium oxide (MAG-OX) 400 MG tablet Take 400 mg by mouth daily.  . Multiple Vitamins-Minerals (MULTIVITAMIN WITH MINERALS) tablet Take 1 tablet by mouth daily.  . Multiple Vitamins-Minerals (VISION FORMULA/LUTEIN) TABS Take by mouth.  . Omega-3 Fatty Acids (FISH OIL) 1000 MG CAPS Take 3 capsules by mouth daily.   . Oxcarbazepine (TRILEPTAL) 300 MG tablet Take 300 mg one tablet in the am & two tablets in the pm.  . propranolol ER (INDERAL LA) 60 MG 24 hr capsule TAKE 1 CAPSULE BY MOUTH ONCE DAILY  . Tdap (BOOSTRIX) 5-2.5-18.5 LF-MCG/0.5 injection Inject 0.5 mLs into the muscle once.  . vitamin B-12 (CYANOCOBALAMIN) 1000 MCG tablet Take 1,000 mcg by mouth daily.  . vitamin E 1000 UNIT capsule Take 1,000 Units by mouth daily.  Marland Kitchen zinc gluconate 50 MG tablet Take 50 mg by mouth daily.  . ziprasidone (GEODON) 20 MG capsule Take 20 mg by mouth daily.  Marland Kitchen EPINEPHrine 0.3 mg/0.3 mL IJ SOAJ injection Inject 0.3 mLs (0.3 mg total) into the muscle once. (Patient not taking: Reported on 11/10/2019)  . [DISCONTINUED] ziprasidone (GEODON) 60 MG capsule Take 60 mg by mouth daily.   No facility-administered encounter medications on file as of 11/10/2019.    Activities of Daily Living In your present state of health, do you have any difficulty performing the following activities: 11/10/2019  Hearing? N  Vision? N  Difficulty concentrating or making decisions? N  Walking or climbing stairs? N  Dressing or bathing? N  Doing errands, shopping? N  Preparing Food and eating ? N  Using the Toilet? N  In the past six  months, have you accidently leaked urine? Y  Comment Managed with daily pad  Do you have problems with loss of bowel control? N  Managing your Medications? N  Managing your Finances? N  Housekeeping or managing your Housekeeping? N  Some recent data might be hidden    Patient Care Team: Leone Haven, MD as PCP - General (Family Medicine) Minna Merritts, MD as Consulting Physician (Cardiology)    Assessment:   This is a routine wellness examination for [place].  Nurse connected with patient 11/10/19 at  8:30 AM EST by a telephone enabled telemedicine application and verified that I am speaking with the correct person using two identifiers. Patient stated full name and DOB. Patient gave permission to continue with virtual visit. Patient's location was at home and Nurse's location was at [place] office.   Patient is alert and oriented x3. Patient denies difficulty focusing or concentrating. Patient likes to read and taking a Bible course for brain stimulation.   Health Maintenance Due: See completed HM at the end of note.   Eye: Visual acuity not assessed. Virtual visit. Followed by their ophthalmologist.  Dental: Visits every 6 months.    Hearing: Demonstrates normal hearing during visit.  Safety:  Patient feels safe at home- yes Patient does have smoke detectors at home- yes Patient does wear sunscreen or protective clothing when in direct sunlight - yes Patient does wear seat belt when in a moving vehicle - yes Patient drives- yes Adequate lighting in walkways free from debris- yes Grab bars and handrails used as appropriate- yes Ambulates with an assistive device- no Cell phone on person when ambulating outside of the home- yes  Social: Alcohol intake - no      Smoking history- former   Smokers in home? none Illicit drug use? none  Medication: Taking as directed and without  issues.  Pill box in use -yes  Self managed - yes   Covid-19: Precautions and  sickness symptoms discussed. Wears mask, social distancing, hand hygiene as appropriate.   Activities of Daily Living Patient denies needing assistance with: household chores, feeding themselves, getting from bed to chair, getting to the toilet, bathing/showering, dressing, managing money, or preparing meals.   Discussed the importance of a healthy diet, water intake and the benefits of aerobic exercise.  Physical activity-squats, arm weights, stair steps.  Diet:  Regular Water: good intake Caffeine: no  Other Providers Patient Care Team: Leone Haven, MD as PCP - General (Family Medicine) Minna Merritts, MD as Consulting Physician (Cardiology)  Exercise Activities and Dietary recommendations Current Exercise Habits: Home exercise routine, Type of exercise: strength training/weights;walking(stair stepping, squats), Time (Minutes): 30, Frequency (Times/Week): 5, Weekly Exercise (Minutes/Week): 150, Intensity: Mild  Goals      Patient Stated   . Weight (lb) < 130 lb (59 kg) (pt-stated)     Eat a healthier diet and stay active.       Fall Risk Fall Risk  11/10/2019 08/07/2019 11/07/2018 10/08/2018 11/06/2017  Falls in the past year? 0 1 0 0 No  Comment - - - - -  Number falls in past yr: - 0 - - -  Comment - - - - -  Injury with Fall? - 1 - - -  Comment - - - - -  Follow up Falls prevention discussed Falls evaluation completed - - -   Timed Get Up and Go performed: no, virtual visit  Depression Screen PHQ 2/9 Scores 11/10/2019 08/07/2019 11/07/2018 10/08/2018  PHQ - 2 Score 0 0 0 0  Exception Documentation - - - -  Not completed - - - -     Cognitive Function MMSE - Mini Mental State Exam 11/06/2017  Orientation to time 5  Orientation to Place 5  Registration 3  Attention/ Calculation 5  Recall 3  Language- name 2 objects 2  Language- repeat 1  Language- follow 3 step command 3  Language- read & follow direction 1  Write a sentence 1  Copy design 1  Total score 30      6CIT Screen 11/10/2019 11/07/2018 10/22/2016  What Year? 0 points 0 points 0 points  What month? 0 points 0 points 0 points  What time? 0 points 0 points 0 points  Count back from 20 0 points 0 points 0 points  Months in reverse 0 points 0 points 0 points  Repeat phrase 0 points 0 points -  Total Score 0 0 -    Immunization History  Administered Date(s) Administered  . Influenza, High Dose Seasonal PF 07/23/2018  . Influenza-Unspecified 08/05/2014, 07/06/2016  . Pneumococcal Conjugate-13 01/23/2016  . Pneumococcal Polysaccharide-23 08/22/2011, 01/16/2019  . Tdap 12/29/2014  . Zoster 12/29/2013  . Zoster Recombinat (Shingrix) 07/23/2018, 09/25/2018   Screening Tests Health Maintenance  Topic Date Due  . MAMMOGRAM  03/27/2020  . COLONOSCOPY  11/06/2021  . TETANUS/TDAP  12/29/2024  . INFLUENZA VACCINE  Completed  . DEXA SCAN  Completed  . Hepatitis C Screening  Completed  . PNA vac Low Risk Adult  Completed      Plan:   Keep all routine maintenance appointments.   Follow up 11/11/19 @ 1:15  Medicare Attestation I have personally reviewed: The patient's medical and social history Their use of alcohol, tobacco or illicit drugs Their current medications and supplements The patient's functional ability including ADLs,fall risks, home  safety risks, cognitive, and hearing and visual impairment Diet and physical activities Evidence for depression   I have reviewed and discussed with patient certain preventive protocols, quality metrics, and best practice recommendations.     Varney Biles, LPN  [id number]

## 2019-11-10 NOTE — Patient Instructions (Addendum)
  Amy Mcconnell , Thank you for taking time to come for your Medicare Wellness Visit. I appreciate your ongoing commitment to your health goals. Please review the following plan we discussed and let me know if I can assist you in the future.   These are the goals we discussed: Goals      Patient Stated   . Weight (lb) < 130 lb (59 kg) (pt-stated)     Eat a healthier diet and stay active.       This is a list of the screening recommended for you and due dates:  Health Maintenance  Topic Date Due  . Mammogram  03/27/2020  . Colon Cancer Screening  11/06/2021  . Tetanus Vaccine  12/29/2024  . Flu Shot  Completed  . DEXA scan (bone density measurement)  Completed  .  Hepatitis C: One time screening is recommended by Center for Disease Control  (CDC) for  adults born from [birth year] through 1966.   Completed  . Pneumonia vaccines  Completed

## 2019-11-11 ENCOUNTER — Encounter: Payer: Self-pay | Admitting: Family Medicine

## 2019-11-11 ENCOUNTER — Ambulatory Visit (INDEPENDENT_AMBULATORY_CARE_PROVIDER_SITE_OTHER): Payer: Medicare HMO | Admitting: Family Medicine

## 2019-11-11 ENCOUNTER — Ambulatory Visit: Payer: Medicare HMO

## 2019-11-11 ENCOUNTER — Other Ambulatory Visit: Payer: Self-pay

## 2019-11-11 DIAGNOSIS — E7800 Pure hypercholesterolemia, unspecified: Secondary | ICD-10-CM

## 2019-11-11 DIAGNOSIS — M79672 Pain in left foot: Secondary | ICD-10-CM | POA: Diagnosis not present

## 2019-11-11 DIAGNOSIS — S0101XA Laceration without foreign body of scalp, initial encounter: Secondary | ICD-10-CM | POA: Diagnosis not present

## 2019-11-11 DIAGNOSIS — R7989 Other specified abnormal findings of blood chemistry: Secondary | ICD-10-CM

## 2019-11-11 DIAGNOSIS — I10 Essential (primary) hypertension: Secondary | ICD-10-CM

## 2019-11-11 NOTE — Assessment & Plan Note (Signed)
Resolved on follow-up lab work.

## 2019-11-11 NOTE — Assessment & Plan Note (Signed)
This continues to be an issue.  I discussed having her complete her exercises and if it is not improving or resolved within the next 2 weeks she should let us know and will refer to podiatry.

## 2019-11-11 NOTE — Assessment & Plan Note (Signed)
She will continue her Lipitor.  We will plan to check lipids in 6 months at follow-up.

## 2019-11-11 NOTE — Assessment & Plan Note (Signed)
Overall significantly improved.  Discussed that it may take up to a year for the numbness on her scalp to improve.  She will monitor.

## 2019-11-11 NOTE — Progress Notes (Signed)
Virtual Visit via video Note  This visit type was conducted due to national recommendations for restrictions regarding the COVID-19 pandemic (e.g. social distancing).  This format is felt to be most appropriate for this patient at this time.  All issues noted in this document were discussed and addressed.  No physical exam was performed (except for noted visual exam findings with Video Visits).   I connected with Amy Mcconnell today at  1:15 PM EST by a video enabled telemedicine application and verified that I am speaking with the correct person using two identifiers. Location patient: home Location provider: work  Persons participating in the virtual visit: patient, provider  I discussed the limitations, risks, security and privacy concerns of performing an evaluation and management service by telephone and the availability of in person appointments. I also discussed with the patient that there may be a patient responsible charge related to this service. The patient expressed understanding and agreed to proceed.   Reason for visit: f/u  HPI: HYPERTENSION  Disease Monitoring  Home BP Monitoring typically 120/80 Chest pain- no    Dyspnea- no Medications  Compliance-  Taking amlodipine, cardura, propranolol  Edema- no  HYPERLIPIDEMIA Symptoms Chest pain on exertion:  no   Leg claudication:   no Medications: Compliance- taking lipitor Right upper quadrant pain- no  Muscle aches- no  Scalp laceration: neck is much better. Improved laceration area that is well-healed. Still some numbness. Some tender spots though improving.   Left foot pain: On the left lateral foot.  She had a negative x-ray.  This is relating to the fall she had over the summer.  She has exercises that she can do to help with this.   ROS: See pertinent positives and negatives per HPI.  Past Medical History:  Diagnosis Date  . 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 Anxiety   . Bipolar disorder ([place])      Dr. Thurmond Butts - every 3 months  . Broken foot    left   . Contact dermatitis and other eczema due to other specified agent   . Contusion of unspecified part of lower limb   . Essential hypertension, benign   . Heart murmur   . Other and unspecified hyperlipidemia   . Other specified disorders of thyroid   . Reflux esophagitis   . Tachycardia    a. isolated episode, seen in ED with negative work up    Past Surgical History:  Procedure Laterality Date  . CATARACT EXTRACTION    . SALPINGECTOMY Left   . STERILIZATION     Her decision since dz of bipolar    Family History  Problem Relation Age of Onset  . Arrhythmia Mother        A-Fib  . Heart failure Mother   . Hyperlipidemia Mother   . Hypertension Mother   . Alzheimer's disease Mother   . Hypertension Father   . Hyperlipidemia Father   . Mental retardation Father 48       Suicide  . Hypertension Brother   . Hyperlipidemia Brother   . Hypertension Brother   . Hyperlipidemia Brother   . Hyperlipidemia Brother   . Hypertension Brother   . Alcohol abuse Brother   . Depression Brother   . Hyperlipidemia Brother   . Hypertension Brother   . Breast cancer Maternal Aunt     SOCIAL HX: Former smoker.   Current Outpatient Medications:  .  acetaminophen (TYLENOL) 500 MG tablet, Take 1,000 mg  by mouth every 6 (six) hours as needed., Disp: , Rfl:  .  amLODipine (NORVASC) 5 MG tablet, Take 1 tablet by mouth once daily, Disp: 90 tablet, Rfl: 0 .  Ascorbic Acid (VITAMIN C) 1000 MG tablet, Take 1,000 mg by mouth daily., Disp: , Rfl:  .  aspirin 81 MG tablet, Take 81 mg by mouth daily., Disp: , Rfl:  .  atorvastatin (LIPITOR) 80 MG tablet, Take 1 tablet by mouth once daily, Disp: 90 tablet, Rfl: 0 .  B Complex Vitamins (VITAMIN B COMPLEX PO), Take by mouth daily., Disp: , Rfl:  .  calcium carbonate (OS-CAL) 600 MG TABS tablet, Take 600 mg by mouth daily. , Disp: , Rfl:  .  Cholecalciferol (VITAMIN D3) 50 MCG (2000 UT) capsule,  Take 2,000 Units by mouth daily., Disp: , Rfl:  .  diphenhydrAMINE (BENADRYL) 25 mg capsule, Take 25 mg by mouth daily as needed for allergies. For food and fragrance sensitivities, Disp: , Rfl:  .  doxazosin (CARDURA) 8 MG tablet, TAKE 1 TABLET BY MOUTH TWICE DAILY (CONTACT  OFFICE  TO  SCHEDULE  FUTURE  APPOINTMENT  AND  REFILLS), Disp: 60 tablet, Rfl: 0 .  EPINEPHrine 0.3 mg/0.3 mL IJ SOAJ injection, Inject 0.3 mLs (0.3 mg total) into the muscle once., Disp: 2 Device, Rfl: 0 .  fexofenadine (ALLEGRA) 180 MG tablet, Take 180 mg by mouth. 2-3 times daily prn For food and fragrance sensitivities/intolerances, Disp: , Rfl:  .  Flaxseed, Linseed, (FLAXSEED OIL PO), Take by mouth., Disp: , Rfl:  .  levothyroxine (SYNTHROID, LEVOTHROID) 125 MCG tablet, Take 125 mcg by mouth daily before breakfast., Disp: , Rfl:  .  lithium carbonate 150 MG capsule, Take 150 mg by mouth 2 (two) times daily with a meal., Disp: , Rfl:  .  magnesium oxide (MAG-OX) 400 MG tablet, Take 400 mg by mouth daily., Disp: , Rfl:  .  Multiple Vitamins-Minerals (MULTIVITAMIN WITH MINERALS) tablet, Take 1 tablet by mouth daily., Disp: , Rfl:  .  Multiple Vitamins-Minerals (VISION FORMULA/LUTEIN) TABS, Take by mouth., Disp: , Rfl:  .  Omega-3 Fatty Acids (FISH OIL) 1000 MG CAPS, Take 3 capsules by mouth daily. , Disp: , Rfl:  .  Oxcarbazepine (TRILEPTAL) 300 MG tablet, Take 300 mg one tablet in the am & two tablets in the pm., Disp: , Rfl:  .  propranolol ER (INDERAL LA) 60 MG 24 hr capsule, TAKE 1 CAPSULE BY MOUTH ONCE DAILY, Disp: 90 capsule, Rfl: 3 .  Tdap (BOOSTRIX) 5-2.5-18.5 LF-MCG/0.5 injection, Inject 0.5 mLs into the muscle once., Disp: 0.5 mL, Rfl: 0 .  vitamin B-12 (CYANOCOBALAMIN) 1000 MCG tablet, Take 1,000 mcg by mouth daily., Disp: , Rfl:  .  vitamin E 1000 UNIT capsule, Take 1,000 Units by mouth daily., Disp: , Rfl:  .  zinc gluconate 50 MG tablet, Take 50 mg by mouth daily., Disp: , Rfl:  .  ziprasidone (GEODON) 20 MG  capsule, Take 20 mg by mouth daily., Disp: , Rfl:   EXAM:  VITALS per patient if applicable:  GENERAL: alert, oriented, appears well and in no acute distress  HEENT: atraumatic, conjunttiva clear, no obvious abnormalities on inspection of external nose and ears  NECK: normal movements of the head and neck  LUNGS: on inspection no signs of respiratory distress, breathing rate appears normal, no obvious gross SOB, gasping or wheezing  CV: no obvious cyanosis  MS: moves all visible extremities without noticeable abnormality  PSYCH/NEURO: pleasant and cooperative, no  obvious depression or anxiety, speech and thought processing grossly intact  ASSESSMENT AND PLAN:  Discussed the following assessment and plan:  Essential hypertension Generally well controlled at home.  Recent lab work reviewed from October.  She is going to continue with her amlodipine and Cardura as they seem to be effective.  Elevated LFTs Resolved on follow-up lab work.  Hyperlipidemia She will continue her Lipitor.  We will plan to check lipids in 6 months at follow-up.  Left foot pain This continues to be an issue.  I discussed having her complete her exercises and if it is not improving or resolved within the next 2 weeks she should let us know and will refer to podiatry.  Scalp laceration Overall significantly improved.  Discussed that it may take up to a year for the numbness on her scalp to improve.  She will monitor.   No orders of the defined types were placed in this encounter.   No orders of the defined types were placed in this encounter.    I discussed the assessment and treatment plan with the patient. The patient was provided an opportunity to ask questions and all were answered. The patient agreed with the plan and demonstrated an understanding of the instructions.   The patient was advised to call back or seek an in-person evaluation if the symptoms worsen or if the condition fails to  improve as anticipated.   Tommi Rumps, MD

## 2019-11-11 NOTE — Assessment & Plan Note (Signed)
Generally well controlled at home.  Recent lab work reviewed from October.  She is going to continue with her amlodipine and Cardura as they seem to be effective.

## 2019-11-12 ENCOUNTER — Other Ambulatory Visit: Payer: Self-pay | Admitting: Cardiovascular Disease

## 2019-11-16 ENCOUNTER — Other Ambulatory Visit: Payer: Self-pay | Admitting: Cardiovascular Disease
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
## 2019-11-24 ENCOUNTER — Other Ambulatory Visit: Payer: Self-pay

## 2019-11-24 ENCOUNTER — Encounter: Payer: Self-pay | Admitting: Cardiovascular Disease

## 2019-11-24 ENCOUNTER — Ambulatory Visit (INDEPENDENT_AMBULATORY_CARE_PROVIDER_SITE_OTHER): Payer: Medicare HMO | Admitting: Cardiovascular Disease

## 2019-11-24 VITALS — BP 102/62 | HR 59 | Ht 67.5 in | Wt 150.0 lb

## 2019-11-24 DIAGNOSIS — R079 Chest pain, unspecified: Secondary | ICD-10-CM

## 2019-11-24 DIAGNOSIS — I479 Paroxysmal tachycardia, unspecified: Secondary | ICD-10-CM | POA: Diagnosis not present

## 2019-11-24 DIAGNOSIS — Z87891 Personal history of nicotine dependence: Secondary | ICD-10-CM

## 2019-11-24 DIAGNOSIS — E7800 Pure hypercholesterolemia, unspecified: Secondary | ICD-10-CM | POA: Diagnosis not present

## 2019-11-24 DIAGNOSIS — I10 Essential (primary) hypertension: Secondary | ICD-10-CM | POA: Diagnosis not present

## 2019-11-24 MED ORDER — DOXAZOSIN MESYLATE 8 MG PO TABS: ORAL_TABLET | ORAL | 3 refills | Status: DC

## 2019-11-24 MED ORDER — PROPRANOLOL HCL ER 60 MG PO CP24: 60.0000 mg | ORAL_CAPSULE | Freq: Every day | ORAL | 4 refills | Status: DC

## 2019-11-24 MED ORDER — ATORVASTATIN CALCIUM 80 MG PO TABS: 80.0000 mg | ORAL_TABLET | Freq: Every day | ORAL | 3 refills | Status: DC

## 2019-11-24 MED ORDER — AMLODIPINE BESYLATE 5 MG PO TABS: 5.0000 mg | ORAL_TABLET | Freq: Every day | ORAL | 3 refills | Status: DC

## 2019-11-24 NOTE — Patient Instructions (Addendum)
Refill all cardiac meds, 90 days 4 refills  Please monitor BP It is running low   Medication Instructions:  No changes  If you need a refill on your cardiac medications before your next appointment, please call your pharmacy.    Lab work: No new labs needed   If you have labs (blood work) drawn today and your tests are completely normal, you will receive your results only by: Marland Kitchen MyChart Message (if you have MyChart) OR . A paper copy in the mail If you have any lab test that is abnormal or we need to change your treatment, we will call you to review the results.   Testing/Procedures: No new testing needed   Follow-Up: At [facility], you and your health needs are our priority.  As part of our continuing mission to provide you with exceptional heart care, we have created designated Provider Care Teams.  These Care Teams include your primary Cardiologist (physician) and Advanced Practice Providers (APPs -  Physician Assistants and Nurse Practitioners) who all work together to provide you with the care you need, when you need it.  . You will need a follow up appointment in 12 months   . Providers on your designated Care Team:   . Murray Hodgkins, NP . Christell Faith, PA-C . Marrianne Mood, PA-C  Any Other Special Instructions Will Be Listed Below (If Applicable).  For educational health videos Log in to : [email or web address] Or : [email or web address], password : [email or web address]

## 2019-12-01 ENCOUNTER — Telehealth: Payer: Self-pay | Admitting: Cardiovascular Disease

## 2019-12-01 NOTE — Telephone Encounter (Signed)
Patient wanted advice on adding these apices to her diet for benefits.  gunger Cloves Cinnamon Cumin Nutmeg turermic Paprika cardiamon cayan pepper  Please advise on any medication issues

## 2019-12-02 NOTE — Telephone Encounter (Signed)
Spoke with patient and advised she may want to check with her pharmacy given her list of medications because some of them I do not know about interactions. Reviewed that their is also drugs.com where you can enter your list and compare as well. She was agreeable given she is on medications other than cardiac that could be checked. She was appreciative for the call back with no further questions at this time.

## 2019-12-15 ENCOUNTER — Telehealth: Payer: Self-pay | Admitting: Cardiovascular Disease

## 2019-12-15 NOTE — Telephone Encounter (Signed)
Pt c/o of Chest Pain: STAT if CP now or developed within 24 hours  1. Are you having CP right now? Just a little  2. Are you experiencing any other symptoms (ex. SOB, nausea, vomiting, sweating)? No other symptoms  3. How long have you been experiencing CP? Sunday afternoon  4. Is your CP continuous or coming and going? Comes and goes  5. Have you taken Nitroglycerin? No  Patient states she received her 2nd dose of the COVID vaccine this morning.  ?

## 2019-12-15 NOTE — Telephone Encounter (Signed)
Spoke with patient and she states that she does not have any pain or pressure now. On Sunday she reports running several errands and was super stressed. She had chest pressure but after drinking to sodas she burped and the pressure went away. She did have some today but it did not persist. Reviewed that I would send to provider for further review and recommendations. Instructed her to call 911 if chest pain returns and persists with no relief. She verbalized understanding of our conversation, agreement with plan, and had no further questions at this time.

## 2020-01-26 ENCOUNTER — Telehealth: Payer: Self-pay | Admitting: Family Medicine

## 2020-01-26 NOTE — Telephone Encounter (Signed)
Pt called and said that she is having on and off calf pain. Calf is not red or warm to the touch. She thinks that it is her Varicose Veins acting up do to the lack of exercise  Pt has been scheduled with Jerelene Redden for 3/24 @ 11am

## 2020-01-27 ENCOUNTER — Ambulatory Visit: Payer: Medicare HMO | Admitting: Nurse Practitioner

## 2020-01-27 ENCOUNTER — Encounter: Payer: Self-pay | Admitting: Nurse Practitioner

## 2020-01-27 ENCOUNTER — Other Ambulatory Visit: Payer: Self-pay

## 2020-01-27 VITALS — BP 104/68 | HR 60 | Temp 97.7°F | Resp 16 | Ht 67.5 in | Wt 153.0 lb

## 2020-01-27 DIAGNOSIS — M79605 Pain in left leg: Secondary | ICD-10-CM

## 2020-01-27 NOTE — Progress Notes (Signed)
Established Patient Office Visit  Subjective:  Patient ID: Amy Mcconnell, female    DOB: [birth year]  Age: 69 y.o. MRN: [id number]  CC:  Chief Complaint  Patient presents with  . Leg Pain    left in calf X 2 days    HPI Amy Mcconnell presents for a 2 day history of left lower calf tenderness in an isolated spot- did exercises yesterday- walked 150 steps and it helped. Today, no tenderness,. She never had any swelling, or redness, or discomfort walking. No cramps. No varicose veins. No long car or plane trips.She did lay in her bed with 2 cats on her crossed legs and thinks that may have cause her tenderness. No Covid infection. She got her second Covid vaccine done by 12/15/19. Flu shot 01/06/20.   Past Medical History:  Diagnosis Date  . 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 Anxiety   . Bipolar disorder ([place])    Dr. Thurmond Butts - every 3 months  . Broken foot    left   . Contact dermatitis and other eczema due to other specified agent   . Contusion of unspecified part of lower limb   . Essential hypertension, benign   . Heart murmur   . Other and unspecified hyperlipidemia   . Other specified disorders of thyroid   . Reflux esophagitis   . Tachycardia    a. isolated episode, seen in ED with negative work up    Past Surgical History:  Procedure Laterality Date  . CATARACT EXTRACTION    . SALPINGECTOMY Left   . STERILIZATION     Her decision since dz of bipolar    Family History  Problem Relation Age of Onset  . Arrhythmia Mother        A-Fib  . Heart failure Mother   . Hyperlipidemia Mother   . Hypertension Mother   . Alzheimer's disease Mother   . Hypertension Father   . Hyperlipidemia Father   . Mental retardation Father 55       Suicide  . Hypertension Brother   . Hyperlipidemia Brother   . Hypertension Brother   . Hyperlipidemia Brother   . Hyperlipidemia Brother   . Hypertension Brother   . Alcohol abuse Brother   . Depression Brother   .  Hyperlipidemia Brother   . Hypertension Brother   . Breast cancer Maternal Aunt     Social History   Socioeconomic History  . Marital status: Divorced    Spouse name: Not on file  . Number of children: 0  . Years of education: 68  . Highest education level: Not on file  Occupational History  . Not on file  Tobacco Use  . Smoking status: Former Smoker    Packs/day: 0.50    Years: 13.00    Pack years: 6.50    Quit date: 01/23/2000    Years since quitting: 20.0  . Smokeless tobacco: Never Used  Substance and Sexual Activity  . Alcohol use: No  . Drug use: No  . Sexual activity: Never  Other Topics Concern  . Not on file  Social History Narrative   Ms. Smithart grew up in [place], [place]. She lives in [place]. Ms. Licht is divorced. She has 2 cats (Charlie and Angie). 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 She also volunteers 2 days at [facility]. She enjoys reading, garding and cooking.   Social Determinants of Health   Financial Resource Strain: Low Risk   .  Difficulty of Paying Living Expenses: Not hard at all  Food Insecurity: No Food Insecurity  . Worried About Charity fundraiser in the Last Year: Never true  . Ran Out of Food in the Last Year: Never true  Transportation Needs: No Transportation Needs  . Lack of Transportation (Medical): No  . Lack of Transportation (Non-Medical): No  Physical Activity: Sufficiently Active  . Days of Exercise per Week: 5 days  . Minutes of Exercise per Session: 30 min  Stress: No Stress Concern Present  . Feeling of Stress : Not at all  Social Connections: Somewhat Isolated  . Frequency of Communication with Friends and Family: More than three times a week  . Frequency of Social Gatherings with Friends and Family: Once a week  . Attends Religious Services: Never  . Active Member of Clubs or Organizations: Yes  . Attends Archivist Meetings: Never  . Marital Status: Divorced  Human resources officer Violence: Not At  Risk  . Fear of Current or Ex-Partner: No  . Emotionally Abused: No  . Physically Abused: No  . Sexually Abused: No    Outpatient Medications Prior to Visit  Medication Sig Dispense Refill  . acetaminophen (TYLENOL) 500 MG tablet Take 1,000 mg by mouth every 6 (six) hours as needed.    Marland Kitchen amLODipine (NORVASC) 5 MG tablet Take 1 tablet (5 mg total) by mouth daily. 90 tablet 3  . Ascorbic Acid (VITAMIN C) 1000 MG tablet Take 1,000 mg by mouth daily.    Marland Kitchen aspirin 81 MG tablet Take 81 mg by mouth daily.    Marland Kitchen atorvastatin (LIPITOR) 80 MG tablet Take 1 tablet (80 mg total) by mouth daily. 90 tablet 3  . B Complex Vitamins (VITAMIN B COMPLEX PO) Take by mouth daily.    . calcium carbonate (OS-CAL) 600 MG TABS tablet Take 600 mg by mouth daily.     . Cholecalciferol (VITAMIN D3) 50 MCG (2000 UT) capsule Take 2,000 Units by mouth daily.    . diphenhydrAMINE (BENADRYL) 25 mg capsule Take 25 mg by mouth daily as needed for allergies. For food and fragrance sensitivities    . doxazosin (CARDURA) 8 MG tablet TAKE 1 TABLET BY MOUTH TWICE DAILY 180 tablet 3  . EPINEPHrine 0.3 mg/0.3 mL IJ SOAJ injection Inject 0.3 mLs (0.3 mg total) into the muscle once. 2 Device 0  . fexofenadine (ALLEGRA) 180 MG tablet Take 180 mg by mouth. 2-3 times daily prn For food and fragrance sensitivities/intolerances    . Flaxseed, Linseed, (FLAXSEED OIL PO) Take by mouth.    . levothyroxine (SYNTHROID, LEVOTHROID) 125 MCG tablet Take 125 mcg by mouth daily before breakfast.    . lithium carbonate 150 MG capsule Take 150 mg by mouth 2 (two) times daily with a meal.    . magnesium oxide (MAG-OX) 400 MG tablet Take 400 mg by mouth daily.    . Multiple Vitamins-Minerals (MULTIVITAMIN WITH MINERALS) tablet Take 1 tablet by mouth daily.    . Multiple Vitamins-Minerals (VISION FORMULA/LUTEIN) TABS Take by mouth.    . Omega-3 Fatty Acids (FISH OIL) 1000 MG CAPS Take 3 capsules by mouth daily.     . Oxcarbazepine (TRILEPTAL) 300 MG  tablet Take 300 mg one tablet in the am & two tablets in the pm.    . propranolol ER (INDERAL LA) 60 MG 24 hr capsule Take 1 capsule (60 mg total) by mouth daily. 90 capsule 4  . vitamin B-12 (CYANOCOBALAMIN) 1000 MCG tablet Take  1,000 mcg by mouth daily.    . vitamin E 1000 UNIT capsule Take 1,000 Units by mouth daily.    Marland Kitchen zinc gluconate 50 MG tablet Take 50 mg by mouth daily.    . ziprasidone (GEODON) 20 MG capsule Take 20 mg by mouth daily.    . Tdap (BOOSTRIX) 5-2.5-18.5 LF-MCG/0.5 injection Inject 0.5 mLs into the muscle once. (Patient not taking: Reported on 01/27/2020) 0.5 mL 0   No facility-administered medications prior to visit.    Allergies  Allergen Reactions  . Ace Inhibitors     Angioedema   . Angiotensin Receptor Blockers     Angioedema   . Benicar [Olmesartan]     Mouth & Lip Swelling, "I swell from the waist down". Throat swelling  . Poison Ivy Extract [Poison Ivy Extract]     Blisters  . Purell Instant Hand [Alcohol] Swelling  . Triclosan     Review of Systems  Constitutional: Negative for chills, fatigue and fever.  Respiratory: Negative for chest tightness and shortness of breath.   Cardiovascular: Negative for chest pain, palpitations and leg swelling.  Musculoskeletal: Negative for arthralgias, back pain and gait problem.  Skin: Negative for color change and rash.  Neurological: Positive for dizziness. Negative for weakness and light-headedness.  Hematological: Negative for adenopathy. Does not bruise/bleed easily.      Objective:    Physical Exam  Constitutional: She appears well-developed and well-nourished.  HENT:  Head: Normocephalic.  Cardiovascular: Normal rate, regular rhythm and normal heart sounds.  Pulmonary/Chest: Effort normal and breath sounds normal.  Musculoskeletal:        General: No tenderness or edema. Normal range of motion.     Comments: Bilat legs are normal. Negative calf tenderness or swelling. No varicose veins. Neg Homans  sign bilat.   Skin: Skin is warm and dry.    BP 104/68 (BP Location: Left Arm, Patient Position: Sitting, Cuff Size: Normal)   Pulse 60   Temp 97.7 F (36.5 C) (Temporal)   Resp 16   Ht 5' 7.5" (1.715 m)   Wt 153 lb (69.4 kg)   SpO2 96%   BMI 23.61 kg/m     Assessment & Plan:   Problem List Items Addressed This Visit    None    Visit Diagnoses    Pain of left lower extremity    -  Primary   Resolved before arrival.      She came in out of an abundance of caution because she felt mild calf discomfort. She has been hearing about Covid vaccine causing blood clots and she became concerned. She was  reassured that her exam was totally normal. Her discomfort resolved before arrival.   She thought she need a tetanus vaccine that is UTD.    Follow-up: Return if symptoms worsen or fail to improve.   This visit occurred during the SARS-CoV-2 public health emergency.  Safety protocols were in place, including screening questions prior to the visit, additional usage of staff PPE, and extensive cleaning of exam room while observing appropriate contact time as indicated for disinfecting solutions.   Denice Paradise, NP

## 2020-01-27 NOTE — Patient Instructions (Signed)
It was a pleasure to meet you today.   Keep up the good work with walking and call us if the leg pain returns.   You had a tetanus shot in 2016 and you are good until 2026.

## 2020-02-05 ENCOUNTER — Ambulatory Visit: Payer: Medicare HMO | Admitting: Family Medicine

## 2020-02-08 ENCOUNTER — Other Ambulatory Visit: Payer: Self-pay

## 2020-02-08 ENCOUNTER — Encounter: Payer: Self-pay | Admitting: Family Medicine

## 2020-02-08 ENCOUNTER — Ambulatory Visit (INDEPENDENT_AMBULATORY_CARE_PROVIDER_SITE_OTHER): Payer: Medicare HMO | Admitting: Family Medicine

## 2020-02-08 DIAGNOSIS — M542 Cervicalgia: Secondary | ICD-10-CM | POA: Diagnosis not present

## 2020-02-08 DIAGNOSIS — R0789 Other chest pain: Secondary | ICD-10-CM | POA: Diagnosis not present

## 2020-02-08 DIAGNOSIS — F3170 Bipolar disorder, currently in remission, most recent episode unspecified: Secondary | ICD-10-CM | POA: Diagnosis not present

## 2020-02-08 DIAGNOSIS — I10 Essential (primary) hypertension: Secondary | ICD-10-CM | POA: Diagnosis not present

## 2020-02-08 NOTE — Progress Notes (Signed)
Pre visit review using our clinic review tool, if applicable. No additional management support is needed unless otherwise documented below in the visit note. 

## 2020-02-08 NOTE — Progress Notes (Signed)
Tommi Rumps, MD Phone: [phone]  Amy Mcconnell is a 69 y.o. female who presents today for f/u.  HYPERTENSION  Disease Monitoring  Home BP Monitoring not checking at home Chest pain- see below     Dyspnea- no Medications  Compliance-  Taking amlodipine, cardura, propranolol.  Edema- no  Chronic chest pain: Patient notes this has been an ongoing issue.  Occurs rarely enough that she does not think much of it.  She notes she discussed with cardiology previously and has had reassuring EKGs.  Prior chest x-ray with no noted abnormalities.  She is able to exercise with no difficulty though does note this discomfort typically comes on with exertion.  No shortness of breath.  No diaphoresis.  No radiation.  No nausea.  Patient has been walking.  She has also been cutting down on carbohydrates.  Chronic neck pain: Occasionally occurs on the right side.  Feels that it is likely arthritis as it feels like it is bone-on-bone.  She can hear cracking at times.  No radiation.  No numbness or weakness.  Tylenol 650 mg x 2 tablets helps with the pain.  Bipolar disorder: She continues to follow with psychiatry.  No recent manic episodes or depression.  She does have tardive dyskinesia and is unsure if it has changed though they have decreased her Geodon dose and had her start on fish oils.  Social History   Tobacco Use  Smoking Status Former Smoker  . Packs/day: 0.50  . Years: 13.00  . Pack years: 6.50  . Quit date: 01/23/2000  . Years since quitting: 20.0  Smokeless Tobacco Never Used     ROS see history of present illness  Objective  Physical Exam Vitals:   02/08/20 1350  BP: 122/80  Pulse: 62  Temp: (!) 97.1 F (36.2 C)  SpO2: 97%    BP Readings from Last 3 Encounters:  02/08/20 122/80  01/27/20 104/68  11/24/19 102/62   Wt Readings from Last 3 Encounters:  02/08/20 153 lb 9.6 oz (69.7 kg)  01/27/20 153 lb (69.4 kg)  11/24/19 150 lb (68 kg)    Physical  Exam Constitutional:      General: She is not in acute distress.    Appearance: She is not diaphoretic.  Cardiovascular:     Rate and Rhythm: Normal rate and regular rhythm.     Heart sounds: Normal heart sounds.  Pulmonary:     Effort: Pulmonary effort is normal.     Breath sounds: Normal breath sounds.  Musculoskeletal:     Right lower leg: No edema.     Left lower leg: No edema.     Comments: No midline neck tenderness, no no midline neck step-off, no muscular neck tenderness  Skin:    General: Skin is warm and dry.  Neurological:     Mental Status: She is alert.     Comments: 5/5 strength in bilateral biceps, triceps, grip, quads, hamstrings, plantar and dorsiflexion, sensation to light touch intact in bilateral UE and LE      Assessment/Plan: Please see individual problem list.  Essential hypertension Well-controlled.  Continue current regimen.  Chest pain Chronic issue.  Does not seem to be cardiac in nature at this time.  Discussed if she has any increasing issues with this or persistent issues she needs to be evaluated by [place] or cardiology.  Bipolar disorder She will continue to see psychiatry.  Neck pain Chronic issue.  Suspect arthritic changes contributing.  She can continue Tylenol.  She will monitor.   No orders of the defined types were placed in this encounter.   No orders of the defined types were placed in this encounter.   This visit occurred during the SARS-CoV-2 public health emergency.  Safety protocols were in place, including screening questions prior to the visit, additional usage of staff PPE, and extensive cleaning of exam room while observing appropriate contact time as indicated for disinfecting solutions.    Tommi Rumps, MD [place]

## 2020-02-08 NOTE — Assessment & Plan Note (Signed)
Chronic issue.  Does not seem to be cardiac in nature at this time.  Discussed if she has any increasing issues with this or persistent issues she needs to be evaluated by [place] or cardiology.

## 2020-02-08 NOTE — Assessment & Plan Note (Signed)
Chronic issue.  Suspect arthritic changes contributing.  She can continue Tylenol.  She will monitor.

## 2020-02-08 NOTE — Assessment & Plan Note (Signed)
She will continue to see psychiatry.

## 2020-02-08 NOTE — Assessment & Plan Note (Signed)
Well-controlled.  Continue current regimen. 

## 2020-02-08 NOTE — Patient Instructions (Signed)
Nice to see you. If you have more frequent chest discomfort or worsening symptoms please be evaluated. You can continue Tylenol for your neck pain.  If your neck pain worsens please let us know.

## 2020-03-05 ENCOUNTER — Other Ambulatory Visit: Payer: Self-pay | Admitting: Cardiovascular Disease

## 2020-03-08 ENCOUNTER — Other Ambulatory Visit: Payer: Self-pay | Admitting: Cardiovascular Disease

## 2020-05-05 ENCOUNTER — Other Ambulatory Visit: Payer: Self-pay | Admitting: Family Medicine

## 2020-05-05 ENCOUNTER — Other Ambulatory Visit: Payer: Self-pay | Admitting: Obstetrics and Gynecology

## 2020-05-05 DIAGNOSIS — Z1231 Encounter for screening mammogram for malignant neoplasm of breast: Secondary | ICD-10-CM

## 2020-05-11 ENCOUNTER — Ambulatory Visit: Payer: Medicare HMO | Admitting: Family Medicine

## 2020-05-18 ENCOUNTER — Ambulatory Visit: Payer: Medicare HMO | Admitting: Family Medicine

## 2020-05-18 ENCOUNTER — Ambulatory Visit
Admission: RE | Admit: 2020-05-18 | Discharge: 2020-05-18 | Disposition: A | Discharge status: Home / Self Care | Payer: Medicare HMO | Source: Ambulatory Visit | Attending: Obstetrics and Gynecology | Admitting: Obstetrics and Gynecology

## 2020-05-18 DIAGNOSIS — Z1231 Encounter for screening mammogram for malignant neoplasm of breast: Secondary | ICD-10-CM

## 2020-05-23 ENCOUNTER — Ambulatory Visit: Payer: Medicare HMO | Admitting: Family Medicine

## 2020-05-30 ENCOUNTER — Other Ambulatory Visit: Payer: Self-pay

## 2020-05-30 ENCOUNTER — Encounter: Payer: Self-pay | Admitting: Family Medicine

## 2020-05-30 ENCOUNTER — Ambulatory Visit (INDEPENDENT_AMBULATORY_CARE_PROVIDER_SITE_OTHER): Payer: Medicare HMO | Admitting: Family Medicine

## 2020-05-30 DIAGNOSIS — E7800 Pure hypercholesterolemia, unspecified: Secondary | ICD-10-CM | POA: Diagnosis not present

## 2020-05-30 DIAGNOSIS — I10 Essential (primary) hypertension: Secondary | ICD-10-CM | POA: Diagnosis not present

## 2020-05-30 DIAGNOSIS — K5900 Constipation, unspecified: Secondary | ICD-10-CM | POA: Diagnosis not present

## 2020-05-30 NOTE — Progress Notes (Signed)
Tommi Rumps, MD Phone: [phone]  Amy Mcconnell is a 69 y.o. female who presents today for follow-up.  Hypertension: Not checking blood pressures.  Taking amlodipine, Cardura, and propranolol.  No chest pain, shortness with, or edema.  She was much more active previously as she was working 2 part-time jobs that kept her up and moving though is no longer working those jobs.  She is thinking about adding in [employer] or walking. She has cut down on carbs and portion sizes in an attempt to lose weight.   Hyperlipidemia: Taking Lipitor. No right upper quadrant pain or myalgias.  Constipation: Patient notes she has a lot of food allergies that decrease her fiber intake.  She brings in 2 forms that outlined her allergies and the food she cannot eat relating to lip and tongue swelling.  She has constipation as a result.  She has started taking a stool softener over-the-counter.  She notes no abdominal pain or blood in her stool though does note her stools are hard at times and it hurts to have a bowel movement.  She is thinking about adding fiber supplementation to her diet.  Social History   Tobacco Use  Smoking Status Former Smoker  . Packs/day: 0.50  . Years: 13.00  . Pack years: 6.50  . Quit date: 01/23/2000  . Years since quitting: 20.3  Smokeless Tobacco Never Used     ROS see history of present illness  Objective  Physical Exam Vitals:   05/30/20 1423  BP: (!) 130/80  Pulse: 72  Temp: 98.3 F (36.8 C)  SpO2: 93%    BP Readings from Last 3 Encounters:  05/30/20 (!) 130/80  02/08/20 122/80  01/27/20 104/68   Wt Readings from Last 3 Encounters:  05/30/20 144 lb 12.8 oz (65.7 kg)  02/08/20 153 lb 9.6 oz (69.7 kg)  01/27/20 153 lb (69.4 kg)    Physical Exam Constitutional:      General: She is not in acute distress.    Appearance: She is not diaphoretic.  Cardiovascular:     Rate and Rhythm: Normal rate and regular rhythm.     Heart sounds: Normal heart  sounds.  Pulmonary:     Effort: Pulmonary effort is normal.     Breath sounds: Normal breath sounds.  Abdominal:     General: Bowel sounds are normal. There is no distension.     Palpations: Abdomen is soft.     Tenderness: There is no abdominal tenderness. There is no guarding or rebound.  Musculoskeletal:     Right lower leg: No edema.     Left lower leg: No edema.  Skin:    General: Skin is warm and dry.  Neurological:     Mental Status: She is alert.      Assessment/Plan: Please see individual problem list.  Essential hypertension Adequate control.  She will continue her current regimen.  Discussed that her blood pressure will likely trend down somewhat she is more active again.  Hyperlipidemia Continue Lipitor.  Constipation Likely related to poor fiber intake.  Encouraged adding a fiber supplement.  She can continue as needed stool softeners as well.  Orders Placed This Encounter  Procedures  . Comp Met (CMET)  . Lipid panel    No orders of the defined types were placed in this encounter.   This visit occurred during the SARS-CoV-2 public health emergency.  Safety protocols were in place, including screening questions prior to the visit, additional usage of staff PPE,  and extensive cleaning of exam room while observing appropriate contact time as indicated for disinfecting solutions.    Tommi Rumps, MD [place]

## 2020-05-30 NOTE — Assessment & Plan Note (Signed)
-  Continue Lipitor °

## 2020-05-30 NOTE — Patient Instructions (Signed)
Nice to see you. Please try the Metamucil.  If your constipation worsens or you start passing blood in your stool please let us know right away.

## 2020-05-30 NOTE — Assessment & Plan Note (Signed)
Likely related to poor fiber intake.  Encouraged adding a fiber supplement.  She can continue as needed stool softeners as well.

## 2020-05-30 NOTE — Assessment & Plan Note (Signed)
Adequate control.  She will continue her current regimen.  Discussed that her blood pressure will likely trend down somewhat she is more active again.

## 2020-05-31 LAB — COMPREHENSIVE METABOLIC PANEL
ALT: 28 U/L (ref 0–35)
AST: 26 U/L (ref 0–37)
Albumin: 4.8 g/dL (ref 3.5–5.2)
Alkaline Phosphatase: 85 U/L (ref 39–117)
BUN: 32 mg/dL — ABNORMAL HIGH (ref 6–23)
CO2: 24 mEq/L (ref 19–32)
Calcium: 10.2 mg/dL (ref 8.4–10.5)
Chloride: 106 mEq/L (ref 96–112)
Creatinine, Ser: 0.87 mg/dL (ref 0.40–1.20)
GFR: 64.45 mL/min (ref >60.00)
Glucose, Bld: 96 mg/dL (ref 70–99)
Potassium: 4.3 mEq/L (ref 3.5–5.1)
Sodium: 137 mEq/L (ref 135–145)
Total Bilirubin: 0.4 mg/dL (ref 0.2–1.2)
Total Protein: 7.4 g/dL (ref 6.0–8.3)

## 2020-05-31 LAB — LIPID PANEL
Cholesterol: 159 mg/dL (ref 0–200)
HDL: 62.4 mg/dL (ref >39.00)
LDL Cholesterol: 72 mg/dL (ref 0–99)
NonHDL: 96.91
Total CHOL/HDL Ratio: 3
Triglycerides: 126 mg/dL (ref 0.0–149.0)
VLDL: 25.2 mg/dL (ref 0.0–40.0)

## 2020-06-01 ENCOUNTER — Telehealth: Payer: Self-pay | Admitting: Family Medicine

## 2020-06-01 NOTE — Telephone Encounter (Signed)
Pt would like a copy of her labs to go to Dr.Ryan [address] [place] [id number]

## 2020-06-01 NOTE — Telephone Encounter (Signed)
Copy of labs was faxed to Dr ryan today.  Jguadalupe Opiela,cma

## 2020-06-22 DIAGNOSIS — H524 Presbyopia: Secondary | ICD-10-CM | POA: Diagnosis not present

## 2020-08-01 ENCOUNTER — Telehealth: Payer: Self-pay | Admitting: Family Medicine

## 2020-08-01 ENCOUNTER — Ambulatory Visit: Payer: Medicare HMO | Admitting: Nurse Practitioner

## 2020-08-01 NOTE — Telephone Encounter (Signed)
Pt said Thursday night she ate a [employer] and she believes she has hives, according from what she read on the internet. She has been itching and it is still not gone. It is even on her scalp. She just want the doctor to know what is going on. Pt didn't request an appointment. Pt said she is taking otc antihistamines.

## 2020-08-02 ENCOUNTER — Ambulatory Visit: Payer: Medicare HMO | Admitting: Nurse Practitioner

## 2020-08-02 NOTE — Telephone Encounter (Signed)
Noted.  I would suggest a visit with me or one of the other providers in the office to evaluate this further.  If her symptoms worsen she should go to urgent care for evaluation.

## 2020-08-02 NOTE — Telephone Encounter (Signed)
Patient is scheduled to see K mills on Monday.  Amir Fick,cma

## 2020-08-02 NOTE — Telephone Encounter (Signed)
Pt said Thursday night she ate a [employer] and she believes she has hives, according from what she read on the internet. She has been itching and it is still not gone. It is even on her scalp. She just want the doctor to know what is going on. Pt didn't request an appointment. Pt said she is taking otc antihistamines.  Cree Napoli,cma

## 2020-08-08 ENCOUNTER — Ambulatory Visit: Payer: Medicare HMO | Admitting: Nurse Practitioner

## 2020-08-10 ENCOUNTER — Ambulatory Visit: Payer: Medicare HMO | Admitting: Nurse Practitioner

## 2020-08-19 IMAGING — MG DIGITAL SCREENING BILAT W/ TOMO W/ CAD
6 of 10 series · 6 of 30 positions shown · non-contrast
Comparison: Previous exam(s).

CLINICAL DATA: Screening.

EXAM:
DIGITAL SCREENING BILATERAL MAMMOGRAM WITH TOMO AND CAD

[R MLO synth-2D (1 of 2)]
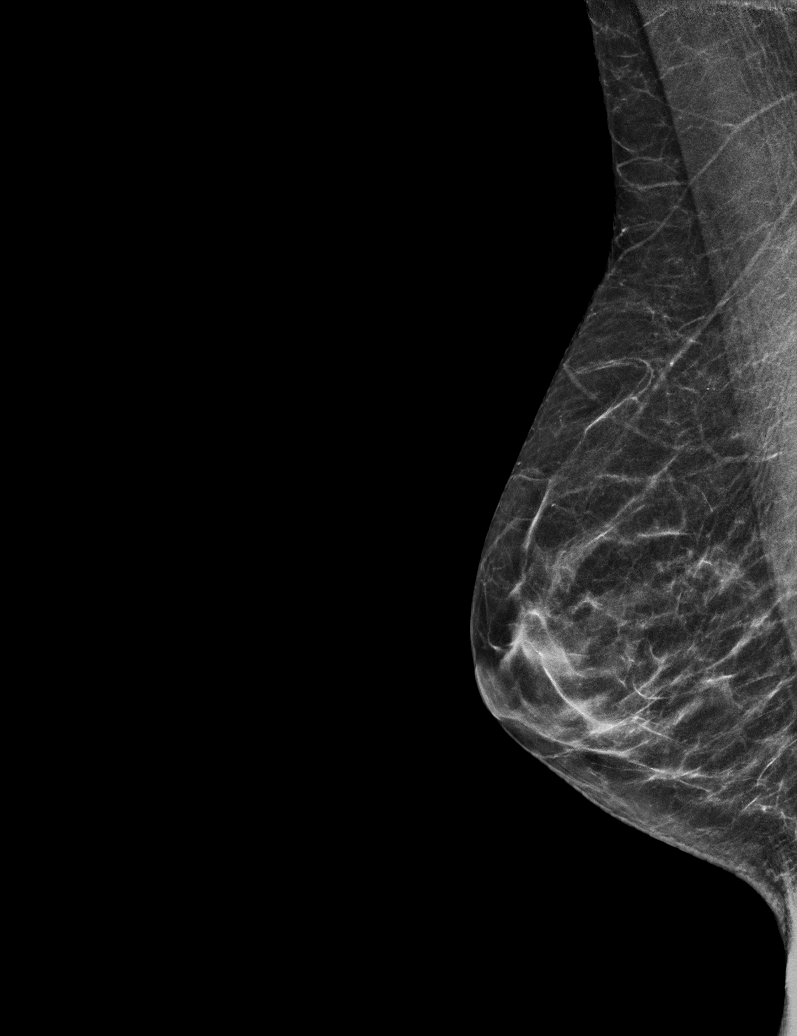

[R MLO synth-2D (2 of 2)]
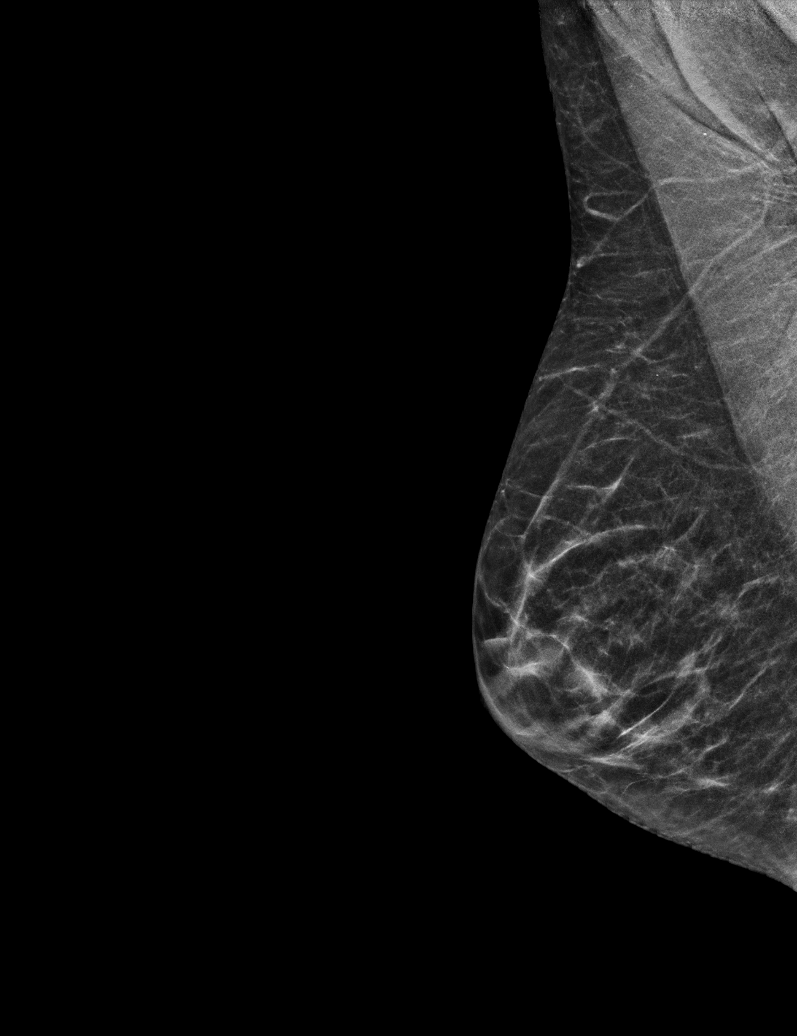

[L CC synth-2D]
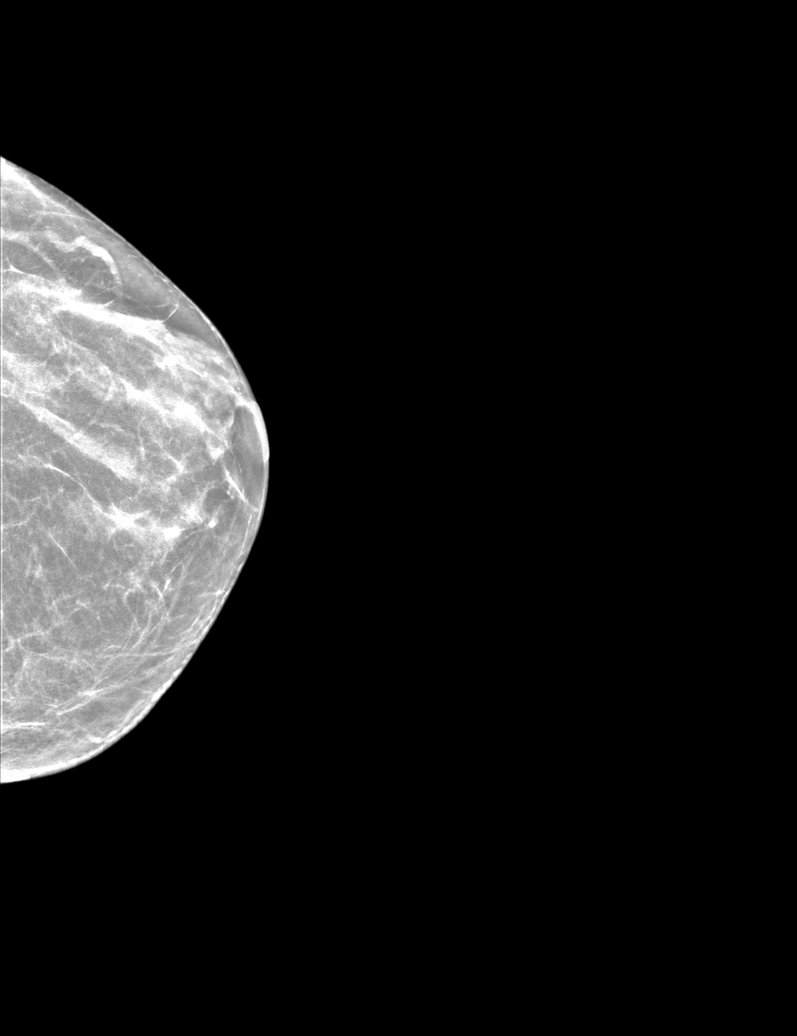

[L MLO synth-2D]
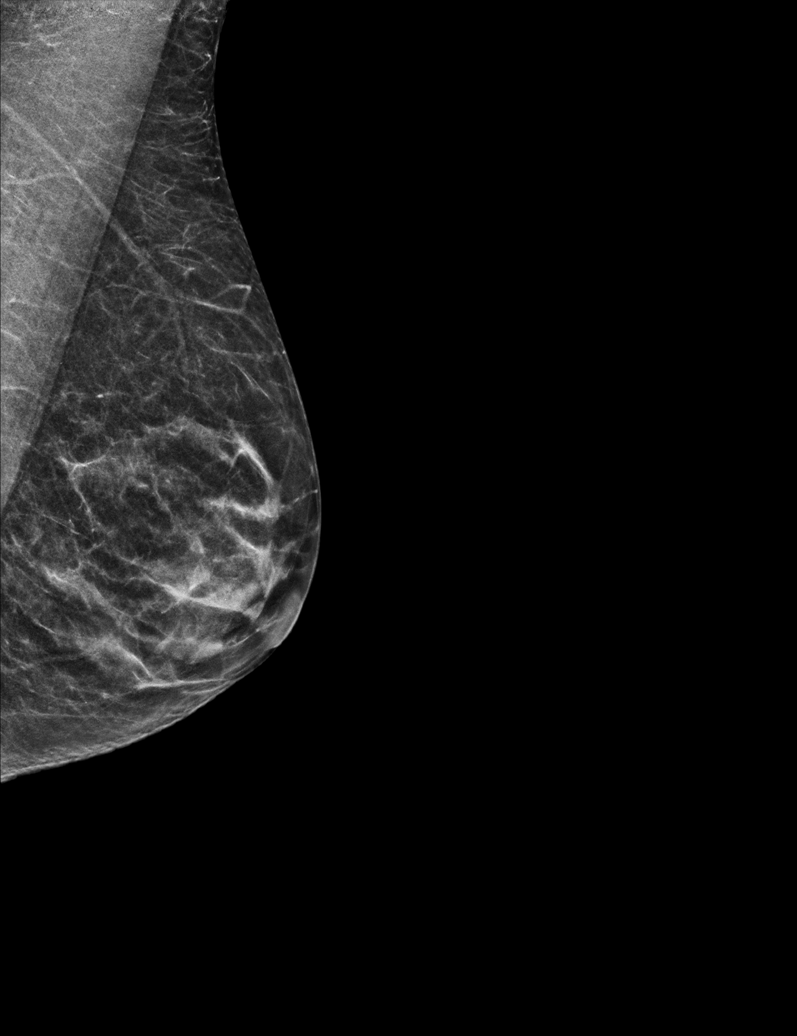

[R CC synth-2D]
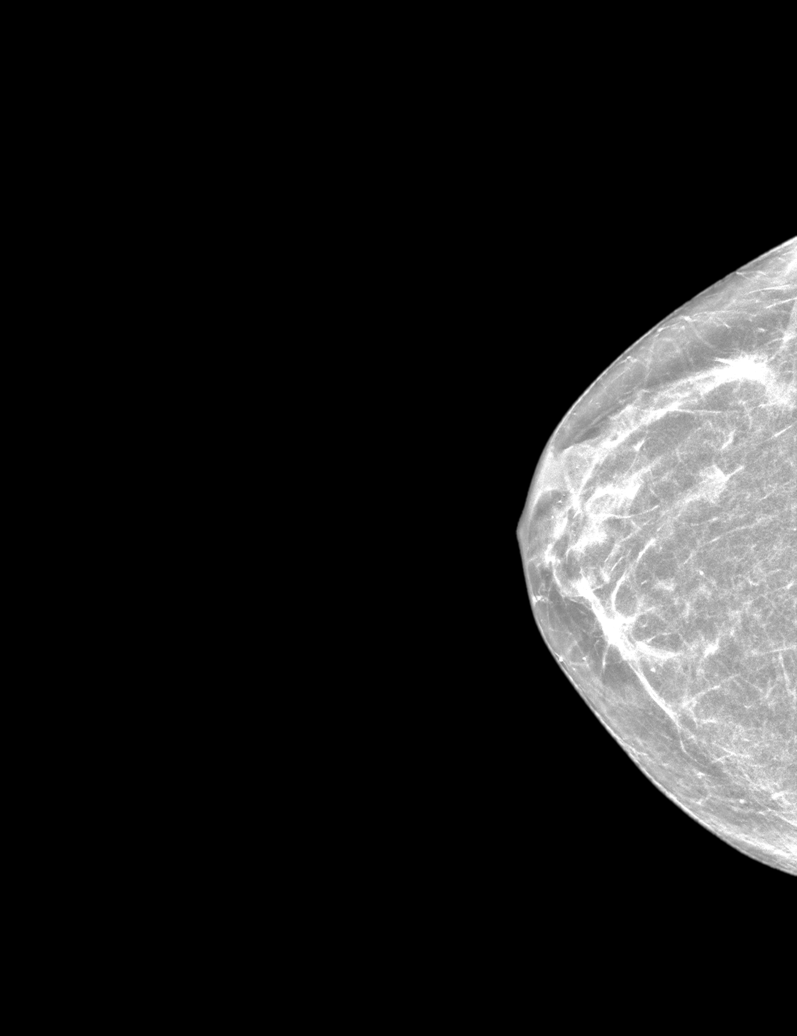

[R MLO tomo · tomo slice 25/49.0]
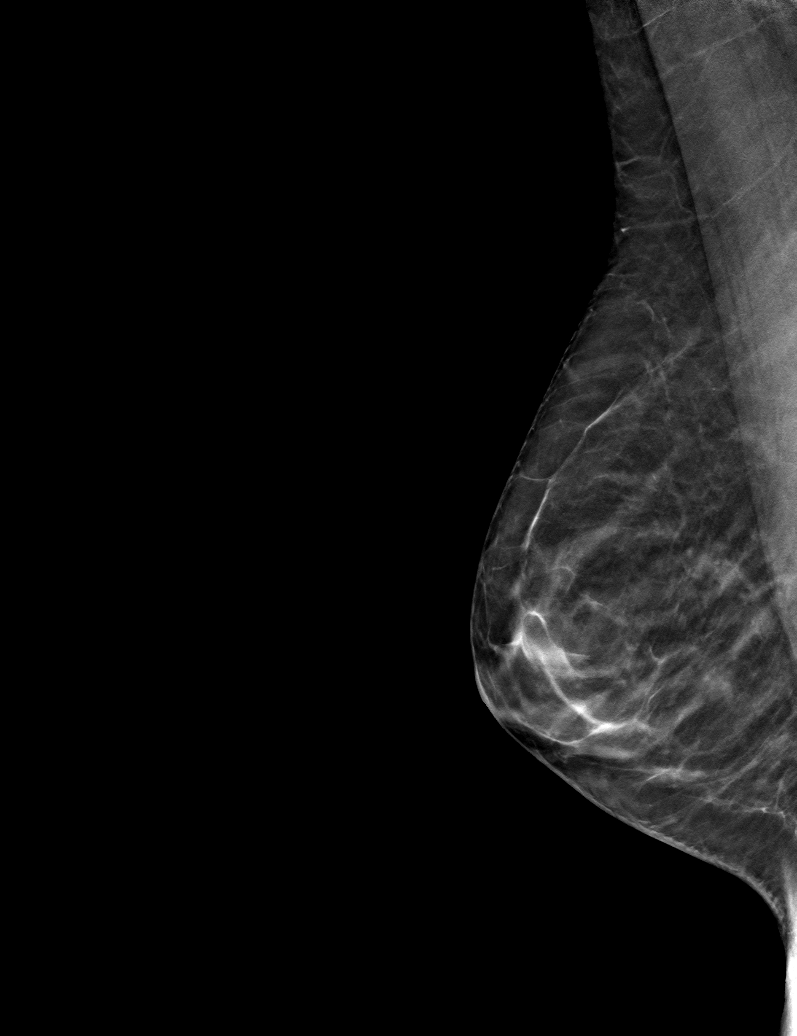

[6 of 30 positions shown; findings below may reference images not displayed]

ACR Breast Density Category c: The breast tissue is heterogeneously
dense, which may obscure small masses.
FINDINGS: There are no findings suspicious for malignancy. Images were
processed with CAD.
IMPRESSION: No mammographic evidence of malignancy. A result letter of this
screening mammogram will be mailed directly to the patient.

RECOMMENDATION:
Screening mammogram in one year. (Code:[id number]-U-LHB)

BI-RADS CATEGORY  1: Negative.

## 2020-09-09 ENCOUNTER — Other Ambulatory Visit: Payer: Self-pay

## 2020-09-09 ENCOUNTER — Other Ambulatory Visit: Payer: Medicare HMO

## 2020-09-09 ENCOUNTER — Ambulatory Visit (INDEPENDENT_AMBULATORY_CARE_PROVIDER_SITE_OTHER): Payer: Medicare HMO

## 2020-09-09 ENCOUNTER — Encounter: Payer: Self-pay | Admitting: Nurse Practitioner

## 2020-09-09 ENCOUNTER — Ambulatory Visit (INDEPENDENT_AMBULATORY_CARE_PROVIDER_SITE_OTHER): Payer: Medicare HMO | Admitting: Nurse Practitioner

## 2020-09-09 VITALS — BP 118/74 | HR 61 | Temp 98.2°F | Ht 67.5 in | Wt 144.0 lb

## 2020-09-09 DIAGNOSIS — M79672 Pain in left foot: Secondary | ICD-10-CM

## 2020-09-09 DIAGNOSIS — M7732 Calcaneal spur, left foot: Secondary | ICD-10-CM | POA: Diagnosis not present

## 2020-09-09 NOTE — Progress Notes (Signed)
Established Patient Office Visit  Subjective:  Patient ID: Amy Mcconnell, female    DOB: 04/30/1951  Age: 69 y.o. MRN: [id number]  CC:  Chief Complaint  Patient presents with  . 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  She had a fracture in the dorsal aspect of th talus with alignment preserved in 2015. Small plana spur of calcaneus in 2015.  She sprained her left foot in June 2020 and Xray was negative. She had physical therapy.  She always has some discomfort along the lateral foot.  Standing and walking during her long days at the hospital exacerbates her left lateral discomfort and wearing tightly laced tennis shoes helps decrease the pain.  She would like to have x-ray today. No recent injury. Tylenol resolves a few days a week. No falls,erythema, warmth, temperature or sensation changes.   Past Medical History:  Diagnosis Date  . 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 Anxiety   . Bipolar disorder ([place])    Dr. Thurmond Butts - every 3 months  . Broken foot    left   . Contact dermatitis and other eczema due to other specified agent   . Contusion of unspecified part of lower limb   . Essential hypertension, benign   . Heart murmur   . Other and unspecified hyperlipidemia   . Other specified disorders of thyroid   . Reflux esophagitis   . Tachycardia    a. isolated episode, seen in ED with negative work up    Past Surgical History:  Procedure Laterality Date  . CATARACT EXTRACTION    . SALPINGECTOMY Left   . STERILIZATION     Her decision since dz of bipolar    Family History  Problem Relation Age of Onset  . Arrhythmia Mother        A-Fib  . Heart failure Mother   . Hyperlipidemia Mother   . Hypertension Mother   . Alzheimer's disease Mother   . Hypertension Father   . Hyperlipidemia Father   . Mental retardation Father 63        Suicide  . Hypertension Brother   . Hyperlipidemia Brother   . Hypertension Brother   . Hyperlipidemia Brother   . Hyperlipidemia Brother   . Hypertension Brother   . Alcohol abuse Brother   . Depression Brother   . Hyperlipidemia Brother   . Hypertension Brother   . Breast cancer Maternal Aunt     Social History   Socioeconomic History  . Marital status: Divorced    Spouse name: Not on file  . Number of children: 0  . Years of education: 49  . Highest education level: Not on file  Occupational History  . Not on file  Tobacco Use  . Smoking status: Former Smoker    Packs/day: 0.50    Years: 13.00    Pack years: 6.50    Quit date: 01/23/2000    Years since quitting: 20.6  . Smokeless tobacco: Never Used  Vaping Use  . Vaping Use: Never used  Substance and Sexual Activity  . Alcohol use: No  . Drug use: No  . Sexual activity: Never  Other Topics Concern  . Not on file  Social History Narrative   Amy Mcconnell grew up in [place], [place]. She lives in  [place]. [place] is divorced. She has 2 cats (Charlie and Angie). She volunteers at [employer] [employer] [facility] [employer]. She also volunteers 2 days at [facility]. She enjoys reading, garding and cooking.   Social Determinants of Health   Financial Resource Strain: Low Risk   . Difficulty of Paying Living Expenses: Not hard at all  Food Insecurity: No Food Insecurity  . Worried About Charity fundraiser in the Last Year: Never true  . Ran Out of Food in the Last Year: Never true  Transportation Needs: No Transportation Needs  . Lack of Transportation (Medical): No  . Lack of Transportation (Non-Medical): No  Physical Activity: Sufficiently Active  . Days of Exercise per Week: 5 days  . Minutes of Exercise per Session: 30 min  Stress: No Stress Concern Present  . Feeling of Stress : Not at all  Social Connections: Moderately Isolated  . Frequency of Communication with Friends and Family: More than three times a week  .  Frequency of Social Gatherings with Friends and Family: Once a week  . Attends Religious Services: Never  . Active Member of Clubs or Organizations: Yes  . Attends Archivist Meetings: Never  . Marital Status: Divorced  Human resources officer Violence: Not At Risk  . Fear of Current or Ex-Partner: No  . Emotionally Abused: No  . Physically Abused: No  . Sexually Abused: No    Outpatient Medications Prior to Visit  Medication Sig Dispense Refill  . ACETAMINOPHEN PO Take 650 mg by mouth in the morning and at bedtime.     Marland Kitchen amLODipine (NORVASC) 5 MG tablet Take 1 tablet (5 mg total) by mouth daily. 90 tablet 3  . Ascorbic Acid (VITAMIN C) 1000 MG tablet Take 1,000 mg by mouth daily.    Marland Kitchen aspirin 81 MG tablet Take 81 mg by mouth daily.    Marland Kitchen atorvastatin (LIPITOR) 80 MG tablet Take 1 tablet (80 mg total) by mouth daily. 90 tablet 3  . B Complex Vitamins (VITAMIN B COMPLEX PO) Take by mouth daily.    . calcium carbonate (OS-CAL) 600 MG TABS tablet Take 600 mg by mouth daily.     . Cholecalciferol (VITAMIN D3) 50 MCG (2000 UT) capsule Take 2,000 Units by mouth daily.    . diphenhydrAMINE (BENADRYL) 25 mg capsule Take 25 mg by mouth daily as needed for allergies. For food and fragrance sensitivities    . Docusate Sodium (RA COL-RITE PO) Take by mouth.    . doxazosin (CARDURA) 8 MG tablet TAKE 1 TABLET BY MOUTH TWICE DAILY 180 tablet 3  . EPINEPHrine 0.3 mg/0.3 mL IJ SOAJ injection Inject 0.3 mLs (0.3 mg total) into the muscle once. 2 Device 0  . fexofenadine (ALLEGRA) 180 MG tablet Take 180 mg by mouth. 2-3 times daily prn For food and fragrance sensitivities/intolerances    . Flaxseed, Linseed, (FLAXSEED OIL PO) Take by mouth.    . levothyroxine (SYNTHROID, LEVOTHROID) 125 MCG tablet Take 125 mcg by mouth daily before breakfast.    . lithium carbonate 150 MG capsule Take 150 mg by mouth 2 (two) times daily with a meal.    . magnesium oxide (MAG-OX) 400 MG tablet Take 400 mg by mouth daily.     . Multiple Vitamins-Minerals (MULTIVITAMIN WITH MINERALS) tablet Take 1 tablet by mouth daily.    . Multiple Vitamins-Minerals (VISION FORMULA/LUTEIN) TABS Take by mouth.    . Omega-3 Fatty Acids (FISH OIL) 1000 MG CAPS Take 3 capsules by  mouth daily.     . Oxcarbazepine (TRILEPTAL) 300 MG tablet Take 300 mg one tablet in the am & two tablets in the pm.    . propranolol ER (INDERAL LA) 60 MG 24 hr capsule Take 1 capsule (60 mg total) by mouth daily. 90 capsule 4  . vitamin B-12 (CYANOCOBALAMIN) 1000 MCG tablet Take 1,000 mcg by mouth daily.    . vitamin E 1000 UNIT capsule Take 1,000 Units by mouth daily.    Marland Kitchen zinc gluconate 50 MG tablet Take 50 mg by mouth daily.    . ziprasidone (GEODON) 20 MG capsule Take 20 mg by mouth daily.    . Tdap (BOOSTRIX) 5-2.5-18.5 LF-MCG/0.5 injection Inject 0.5 mLs into the muscle once. (Patient not taking: Reported on 09/09/2020) 0.5 mL 0   No facility-administered medications prior to visit.    Allergies  Allergen Reactions  . Ace Inhibitors     Angioedema   . Angiotensin Receptor Blockers     Angioedema   . Benicar [Olmesartan]     Mouth & Lip Swelling, "I swell from the waist down". Throat swelling  . Poison Ivy Extract [Poison Ivy Extract]     Blisters  . Purell Instant Hand [Alcohol] Swelling  . Triclosan     Review of Systems Pertinent positives noted in history of present illness and otherwise negative.   Objective:    Physical Exam Constitutional:      Appearance: She is normal weight.  Musculoskeletal:        General: Tenderness present. Normal range of motion.     Comments: Left foot has tenderness along lateral side- no swelling, bruising. She has a bony deformity right dorsal foot- old from soup can injury decades ago-non tender. 2+ DP/PT pulses, no dorsal pain or tenderness,  normal ROM toes and ankle      Skin:    General: Skin is warm and dry.  Neurological:     Mental Status: She is alert.    BP 118/74 (BP Location:  Left Arm, Patient Position: Sitting, Cuff Size: Normal)   Pulse 61   Temp 98.2 F (36.8 C) (Oral)   Ht 5' 7.5" (1.715 m)   Wt 144 lb (65.3 kg)   SpO2 98%   BMI 22.22 kg/m  Wt Readings from Last 3 Encounters:  09/09/20 144 lb (65.3 kg)  05/30/20 144 lb 12.8 oz (65.7 kg)  02/08/20 153 lb 9.6 oz (69.7 kg)   Pulse Readings from Last 3 Encounters:  09/09/20 61  05/30/20 72  02/08/20 62    BP Readings from Last 3 Encounters:  09/09/20 118/74  05/30/20 (!) 130/80  02/08/20 122/80    Lab Results  Component Value Date   CHOL 159 05/30/2020   HDL 62.40 05/30/2020   LDLCALC 72 05/30/2020   TRIG 126.0 05/30/2020   CHOLHDL 3 05/30/2020      There are no preventive care reminders to display for this patient.  There are no preventive care reminders to display for this patient.  Lab Results  Component Value Date   TSH 3.78 08/07/2019   Lab Results  Component Value Date   WBC 5.9 08/07/2019   HGB 13.5 08/07/2019   HCT 41.7 08/07/2019   MCV 85.1 08/07/2019   PLT 196.0 08/07/2019   Lab Results  Component Value Date   NA 137 05/30/2020   Amy 4.3 05/30/2020   CO2 24 05/30/2020   GLUCOSE 96 05/30/2020   BUN 32 (H) 05/30/2020   CREATININE 0.87 05/30/2020  BILITOT 0.4 05/30/2020   ALKPHOS 85 05/30/2020   AST 26 05/30/2020   ALT 28 05/30/2020   PROT 7.4 05/30/2020   ALBUMIN 4.8 05/30/2020   CALCIUM 10.2 05/30/2020   ANIONGAP 10 04/14/2019   GFR 64.45 05/30/2020   Lab Results  Component Value Date   CHOL 159 05/30/2020   Lab Results  Component Value Date   HDL 62.40 05/30/2020   Lab Results  Component Value Date   LDLCALC 72 05/30/2020   Lab Results  Component Value Date   TRIG 126.0 05/30/2020   Lab Results  Component Value Date   CHOLHDL 3 05/30/2020   No results found for: HGBA1C    Assessment & Plan:   Problem List Items Addressed This Visit      Other   Left foot pain - Primary   Relevant Orders   Ambulatory referral to Podiatry   DG Foot  Complete Left     Pt advised:  To Xray today. We will call with results.   Tylenol for pain. May try ice/heat when bothering you.   Referral placed to PODIATRY.   Follow-up: Return if symptoms worsen or fail to improve.   This visit occurred during the SARS-CoV-2 public health emergency.  Safety protocols were in place, including screening questions prior to the visit, additional usage of staff PPE, and extensive cleaning of exam room while observing appropriate contact time as indicated for disinfecting solutions.   Denice Paradise, NP

## 2020-09-09 NOTE — Patient Instructions (Addendum)
To Xray today. We will call with results.   Tylenol for pain. May try ice/heat when bothering you.   Referral placed to PODIATRY.     Foot Pain Many things can cause foot pain. Some common causes are:  An injury.  A sprain.  Arthritis.  Blisters.  Bunions. Follow these instructions at home: Managing pain, stiffness, and swelling If directed, put ice on the painful area:  Put ice in a plastic bag.  Place a towel between your skin and the bag.  Leave the ice on for 20 minutes, 2-3 times a day.  Activity  Do not stand or walk for long periods.  Return to your normal activities as told by your health care provider. Ask your health care provider what activities are safe for you.  Do stretches to relieve foot pain and stiffness as told by your health care provider.  Do not lift anything that is heavier than 10 lb (4.5 kg), or the limit that you are told, until your health care provider says that it is safe. Lifting a lot of weight can put added pressure on your feet. Lifestyle  Wear comfortable, supportive shoes that fit you well. Do not wear high heels.  Keep your feet clean and dry. General instructions  Take over-the-counter and prescription medicines only as told by your health care provider.  Rub your foot gently.  Pay attention to any changes in your symptoms.  Keep all follow-up visits as told by your health care provider. This is important. Contact a health care provider if:  Your pain does not get better after a few days of self-care.  Your pain gets worse.  You cannot stand on your foot. Get help right away if:  Your foot is numb or tingling.  Your foot or toes are swollen.  Your foot or toes turn white or blue.  You have warmth and redness along your foot. Summary  Common causes of foot pain are injury, sprain, arthritis, blisters or bunions.  Ice, medicines, and comfortable shoes may help foot pain.  Contact your health care provider if  your pain does not get better after a few days of self-care. This information is not intended to replace advice given to you by your health care provider. Make sure you discuss any questions you have with your health care provider. Document Revised: 08/07/2018 Document Reviewed: 08/07/2018 Elsevier Patient Education  [place].

## 2020-09-11 ENCOUNTER — Encounter: Payer: Self-pay | Admitting: Nurse Practitioner

## 2020-09-14 ENCOUNTER — Ambulatory Visit (INDEPENDENT_AMBULATORY_CARE_PROVIDER_SITE_OTHER): Payer: Medicare HMO

## 2020-09-14 ENCOUNTER — Telehealth: Payer: Self-pay | Admitting: *Deleted

## 2020-09-14 ENCOUNTER — Other Ambulatory Visit: Payer: Self-pay | Admitting: Podiatry

## 2020-09-14 ENCOUNTER — Other Ambulatory Visit: Payer: Self-pay

## 2020-09-14 ENCOUNTER — Ambulatory Visit: Payer: Medicare HMO | Admitting: Podiatry

## 2020-09-14 DIAGNOSIS — M7672 Peroneal tendinitis, left leg: Secondary | ICD-10-CM | POA: Diagnosis not present

## 2020-09-14 DIAGNOSIS — M79672 Pain in left foot: Secondary | ICD-10-CM | POA: Diagnosis not present

## 2020-09-14 DIAGNOSIS — Z8781 Personal history of (healed) traumatic fracture: Secondary | ICD-10-CM

## 2020-09-14 DIAGNOSIS — S92215A Nondisplaced fracture of cuboid bone of left foot, initial encounter for closed fracture: Secondary | ICD-10-CM

## 2020-09-14 NOTE — Telephone Encounter (Signed)
Amy Mcconnell called stating that she has a bone spur in her left foot and has an appointment scheduled with Dr. Sherryle Lis podiatrist today. Amy Mcconnell wants to know when she last saw Dr. Lorelei Pont for her broken left foot. Amy Mcconnell was given information on the dates that she was seeing Dr. Lorelei Pont for her foot. Amy Mcconnell appreciated the information.

## 2020-09-14 NOTE — Patient Instructions (Signed)
Look for Voltaren gel at the pharmacy over the counter or online (also known as diclofenac 1% gel). Apply to the painful areas 3-4x daily with the supplied dosing card. Allow to dry for 10 minutes before going into socks/shoes   Check with your cardiologist and PCP prior to using   Peroneal Tendinopathy Rehab Ask your health care provider which exercises are safe for you. Do exercises exactly as told by your health care provider and adjust them as directed. It is normal to feel mild stretching, pulling, tightness, or discomfort as you do these exercises. Stop right away if you feel sudden pain or your pain gets worse. Do not begin these exercises until told by your health care provider. Stretching and range-of-motion exercises These exercises warm up your muscles and joints and improve the movement and flexibility of your ankle. These exercises also help to relieve pain and stiffness. Gastroc and soleus stretch, standing  This is an exercise in which you stand on a step and use your body weight to stretch your calf muscles. To do this exercise: 1. Stand on the edge of a step on the ball of your left / right foot. The ball of your foot is on the walking surface, right under your toes. 2. Keep your other foot firmly on the same step. 3. Hold on to the wall, a railing, or a chair for balance. 4. Slowly lift your other foot, allowing your body weight to press your left / right heel down over the edge of the step. You should feel a stretch in your left / right calf (gastrocnemius and soleus). 5. Hold this position for 15 seconds. 6. Return both feet to the step. 7. Repeat this exercise with a slight bend in your left / right knee. Repeat 5 times with your left / right knee straight and 5 times with your left / right knee bent. Complete this exercise 2 times a day. Strengthening exercises These exercises build strength and endurance in your foot and ankle. Endurance is the ability to use your muscles  for a long time, even after they get tired. Ankle dorsiflexion with band   1. Secure a rubber exercise band or tube to an object, such as a table leg, that will not move when the band is pulled. 2. Secure the other end of the band around your left / right foot. 3. Sit on the floor, facing the object with your left / right leg extended. The band or tube should be slightly tense when your foot is relaxed. 4. Slowly flex your left / right ankle and toes to bring your foot toward you (dorsiflexion). 5. Hold this position for 15 seconds. 6. Let the band or tube slowly pull your foot back to the starting position. Repeat 5 times. Complete this exercise 2 times a day. Ankle eversion 1. Sit on the floor with your legs straight out in front of you. 2. Loop a rubber exercise band or tube around the ball of your left / right foot. The ball of your foot is on the walking surface, right under your toes. 3. Hold the ends of the band in your hands, or secure the band to a stable object. The band or tube should be slightly tense when your foot is relaxed. 4. Slowly push your foot outward, away from your other leg (eversion). 5. Hold this position for 15 seconds. 6. Slowly return your foot to the starting position. Repeat 5 times. Complete this exercise 2 times a day.  Plantar flexion, standing  This exercise is sometimes called standing heel raise. 1. Stand with your feet shoulder-width apart. 2. Place your hands on a wall or table to steady yourself as needed, but try not to use it for support. 3. Keep your weight spread evenly over the width of your feet while you slowly rise up on your toes (plantar flexion). If told by your health care provider: ? Shift your weight toward your left / right leg until you feel challenged. ? Stand on your left / right leg only. 4. Hold this position for 15 seconds. Repeat 2 times. Complete this exercise 2 times a day. Single leg stand 1. Without shoes, stand near a  railing or in a doorway. You may hold on to the railing or door frame as needed. 2. Stand on your left / right foot. Keep your big toe down on the floor and try to keep your arch lifted. ? Do not roll to the outside of your foot. ? If this exercise is too easy, you can try it with your eyes closed or while standing on a pillow. 3. Hold this position for 15 seconds. Repeat 5 times. Complete this exercise 2 times a day. This information is not intended to replace advice given to you by your health care provider. Make sure you discuss any questions you have with your health care provider. Document Revised: 02/10/2019 Document Reviewed: 02/10/2019 Elsevier Patient Education  [place].

## 2020-09-18 ENCOUNTER — Encounter: Payer: Self-pay | Admitting: Podiatry

## 2020-09-18 NOTE — Progress Notes (Signed)
  Subjective:  Patient ID: Amy Mcconnell, female    DOB: 04/06/1951,  MRN: [id number]  Chief Complaint  Patient presents with  . Foot Pain    pt is here for left foot pain, pt states that the pain is elevated to the touch, pt is also concerned that her right foot is hurting as well, and is looking to have her right big toe looked at.    69 y.o. female presents with the above complaint. History confirmed with patient. Thinks she broke her hallux R previously, not painful now. Pain in L foot improving. She works at [employer] as Psychologist, occupational. She wears orthotics.  Objective:  Physical Exam: warm, good capillary refill, no trophic changes or ulcerative lesions, normal DP and PT pulses and normal sensory exam. Left Foot: mild pain over CCJ and with resisted eversion of peroneals  Right Foot: no pain edema or bruising of hallux   Radiographs: X-ray of both feet: no evidence of prior fracture R hallux, no degenerative changes, there is a small avulsion fracture vs tiny os peroneum at the plantar lateral CCJ Assessment:   1. Foot pain, left   2. Peroneal tendinitis, left   3. Closed nondisplaced fracture of cuboid of left foot, initial encounter   4. History of fracture of phalanx of toe      Plan:  Patient was evaluated and treated and all questions answered.  -Discussed etiology and treatment options with her. Overall is improving. I think a CAM boot would be more difficult for her than the benefit it may offer. Recommend AFO support for 2-3 weeks, WBAT in supportive shoes. RICE protocol reviewed   Return if symptoms worsen or fail to improve.

## 2020-10-07 ENCOUNTER — Other Ambulatory Visit: Payer: Medicare HMO

## 2020-10-26 ENCOUNTER — Ambulatory Visit: Payer: Medicare HMO | Admitting: Podiatry

## 2020-10-31 ENCOUNTER — Ambulatory Visit (INDEPENDENT_AMBULATORY_CARE_PROVIDER_SITE_OTHER): Payer: Medicare HMO | Admitting: Podiatry

## 2020-10-31 ENCOUNTER — Other Ambulatory Visit: Payer: Self-pay

## 2020-10-31 ENCOUNTER — Ambulatory Visit (INDEPENDENT_AMBULATORY_CARE_PROVIDER_SITE_OTHER): Payer: Medicare HMO

## 2020-10-31 DIAGNOSIS — S92215A Nondisplaced fracture of cuboid bone of left foot, initial encounter for closed fracture: Secondary | ICD-10-CM

## 2020-10-31 DIAGNOSIS — M7672 Peroneal tendinitis, left leg: Secondary | ICD-10-CM | POA: Diagnosis not present

## 2020-10-31 NOTE — Progress Notes (Signed)
  Subjective:  Patient ID: Amy Mcconnell, female    DOB: [birth year],  MRN: [id number]  Left foot is doing much better  69 y.o. female returns with the above complaint. History confirmed with patient.  Left foot is much better.  She has been using the ankle brace  Objective:  Physical Exam: warm, good capillary refill, no trophic changes or ulcerative lesions, normal DP and PT pulses and normal sensory exam. Left Foot: 5 out of 5 strength no pain with resistance and no pain over the CCJ today Right Foot: no pain edema or bruising of hallux   Radiographs: X-ray of both feet: Avulsion fracture still noted but appears to be improved in appearance Assessment:   1. Closed nondisplaced fracture of cuboid of left foot, initial encounter   2. Peroneal tendinitis, left      Plan:  Patient was evaluated and treated and all questions answered.  -Continue stretching and icing as needed -Reviewed some rehab exercises to strengthen the peroneal and posterior tibial tendons for balance -Wean and discontinue bracing over next couple weeks -Expect this to be about 100% in the next 4 to 6 weeks   Return if symptoms worsen or fail to improve.

## 2020-11-11 ENCOUNTER — Ambulatory Visit (INDEPENDENT_AMBULATORY_CARE_PROVIDER_SITE_OTHER): Payer: Medicare HMO

## 2020-11-11 VITALS — BP 137/80 | Ht 67.5 in | Wt 140.0 lb

## 2020-11-11 DIAGNOSIS — R32 Unspecified urinary incontinence: Secondary | ICD-10-CM

## 2020-11-11 DIAGNOSIS — Z0000 Encounter for general adult medical examination without abnormal findings: Secondary | ICD-10-CM

## 2020-11-11 NOTE — Patient Instructions (Addendum)
[name] , Thank you for taking time to come for your Medicare Wellness Visit. I appreciate your ongoing commitment to your health goals. Please review the following plan we discussed and let me know if I can assist you in the future.   These are the goals we discussed: Goals      Patient Stated   .  I want to lower sodium intake (pt-stated)       This is a list of the screening recommended for you and due dates:  Health Maintenance  Topic Date Due  . Colon Cancer Screening  11/06/2021  . Mammogram  05/18/2022  . Tetanus Vaccine  12/29/2024  . Flu Shot  Completed  . DEXA scan (bone density measurement)  Completed  . COVID-19 Vaccine  Completed  .  Hepatitis C: One time screening is recommended by Center for Disease Control  (CDC) for  adults born from [birth year] through 1966.   Completed  . Pneumonia vaccines  Completed    Immunizations Immunization History  Administered Date(s) Administered  . Influenza, High Dose Seasonal PF 07/23/2018, 08/23/2020  . Influenza-Unspecified 08/05/2014, 07/06/2016, 01/06/2020  . PFIZER SARS-COV-2 Vaccination 11/23/2019, 12/15/2019, 08/26/2020  . Pneumococcal Conjugate-13 01/23/2016  . Pneumococcal Polysaccharide-23 08/22/2011, 01/16/2019  . Tdap 12/29/2014  . Zoster 12/29/2013  . Zoster Recombinat (Shingrix) 07/23/2018, 09/25/2018   Keep all routine maintenance appointments.   Follow up 11/30/20 @ 2:15  Advanced directives: not yet completed  Follow up in one year for your annual wellness visit.   Preventive Care 71 Years and Older, Female Preventive care refers to lifestyle choices and visits with your health care provider that can promote health and wellness. What does preventive care include?  A yearly physical exam. This is also called an annual well check.  Dental exams once or twice a year.  Routine eye exams. Ask your health care provider how often you should have your eyes checked.  Personal lifestyle choices,  including:  Daily care of your teeth and gums.  Regular physical activity.  Eating a healthy diet.  Avoiding tobacco and drug use.  Limiting alcohol use.  Practicing safe sex.  Taking low-dose aspirin every day.  Taking vitamin and mineral supplements as recommended by your health care provider. What happens during an annual well check? The services and screenings done by your health care provider during your annual well check will depend on your age, overall health, lifestyle risk factors, and family history of disease. Counseling  Your health care provider may ask you questions about your:  Alcohol use.  Tobacco use.  Drug use.  Emotional well-being.  Home and relationship well-being.  Sexual activity.  Eating habits.  History of falls.  Memory and ability to understand (cognition).  Work and work Statistician.  Reproductive health. Screening  You may have the following tests or measurements:  Height, weight, and BMI.  Blood pressure.  Lipid and cholesterol levels. These may be checked every 5 years, or more frequently if you are over 71 years old.  Skin check.  Lung cancer screening. You may have this screening every year starting at age 71 if you have a 30-pack-year history of smoking and currently smoke or have quit within the past 15 years.  Fecal occult blood test (FOBT) of the stool. You may have this test every year starting at age 71.  Flexible sigmoidoscopy or colonoscopy. You may have a sigmoidoscopy every 5 years or a colonoscopy every 10 years starting at age 71.  Hepatitis C  blood test.  Hepatitis B blood test.  Sexually transmitted disease (STD) testing.  Diabetes screening. This is done by checking your blood sugar (glucose) after you have not eaten for a while (fasting). You may have this done every 1-3 years.  Bone density scan. This is done to screen for osteoporosis. You may have this done starting at age 71.  Mammogram. This  may be done every 1-2 years. Talk to your health care provider about how often you should have regular mammograms. Talk with your health care provider about your test results, treatment options, and if necessary, the need for more tests. Vaccines  Your health care provider may recommend certain vaccines, such as:  Influenza vaccine. This is recommended every year.  Tetanus, diphtheria, and acellular pertussis (Tdap, Td) vaccine. You may need a Td booster every 10 years.  Zoster vaccine. You may need this after age 71.  Pneumococcal 13-valent conjugate (PCV13) vaccine. One dose is recommended after age 71.  Pneumococcal polysaccharide (PPSV23) vaccine. One dose is recommended after age 71. Talk to your health care provider about which screenings and vaccines you need and how often you need them. This information is not intended to replace advice given to you by your health care provider. Make sure you discuss any questions you have with your health care provider. Document Released: 11/18/2015 Document Revised: 07/11/2016 Document Reviewed: 08/23/2015 Elsevier Interactive Patient Education  2017 [place] Prevention in the Home Falls can cause injuries. They can happen to people of all ages. There are many things you can do to make your home safe and to help prevent falls. What can I do on the outside of my home?  Regularly fix the edges of walkways and driveways and fix any cracks.  Remove anything that might make you trip as you walk through a door, such as a raised step or threshold.  Trim any bushes or trees on the path to your home.  Use bright outdoor lighting.  Clear any walking paths of anything that might make someone trip, such as rocks or tools.  Regularly check to see if handrails are loose or broken. Make sure that both sides of any steps have handrails.  Any raised decks and porches should have guardrails on the edges.  Have any leaves, snow, or ice cleared  regularly.  Use sand or salt on walking paths during winter.  Clean up any spills in your garage right away. This includes oil or grease spills. What can I do in the bathroom?  Use night lights.  Install grab bars by the toilet and in the tub and shower. Do not use towel bars as grab bars.  Use non-skid mats or decals in the tub or shower.  If you need to sit down in the shower, use a plastic, non-slip stool.  Keep the floor dry. Clean up any water that spills on the floor as soon as it happens.  Remove soap buildup in the tub or shower regularly.  Attach bath mats securely with double-sided non-slip rug tape.  Do not have throw rugs and other things on the floor that can make you trip. What can I do in the bedroom?  Use night lights.  Make sure that you have a light by your bed that is easy to reach.  Do not use any sheets or blankets that are too big for your bed. They should not hang down onto the floor.  Have a firm chair that has side arms. You  can use this for support while you get dressed.  Do not have throw rugs and other things on the floor that can make you trip. What can I do in the kitchen?  Clean up any spills right away.  Avoid walking on wet floors.  Keep items that you use a lot in easy-to-reach places.  If you need to reach something above you, use a strong step stool that has a grab bar.  Keep electrical cords out of the way.  Do not use floor polish or wax that makes floors slippery. If you must use wax, use non-skid floor wax.  Do not have throw rugs and other things on the floor that can make you trip. What can I do with my stairs?  Do not leave any items on the stairs.  Make sure that there are handrails on both sides of the stairs and use them. Fix handrails that are broken or loose. Make sure that handrails are as long as the stairways.  Check any carpeting to make sure that it is firmly attached to the stairs. Fix any carpet that is loose  or worn.  Avoid having throw rugs at the top or bottom of the stairs. If you do have throw rugs, attach them to the floor with carpet tape.  Make sure that you have a light switch at the top of the stairs and the bottom of the stairs. If you do not have them, ask someone to add them for you. What else can I do to help prevent falls?  Wear shoes that:  Do not have high heels.  Have rubber bottoms.  Are comfortable and fit you well.  Are closed at the toe. Do not wear sandals.  If you use a stepladder:  Make sure that it is fully opened. Do not climb a closed stepladder.  Make sure that both sides of the stepladder are locked into place.  Ask someone to hold it for you, if possible.  Clearly mark and make sure that you can see:  Any grab bars or handrails.  First and last steps.  Where the edge of each step is.  Use tools that help you move around (mobility aids) if they are needed. These include:  Canes.  Walkers.  Scooters.  Crutches.  Turn on the lights when you go into a dark area. Replace any light bulbs as soon as they burn out.  Set up your furniture so you have a clear path. Avoid moving your furniture around.  If any of your floors are uneven, fix them.  If there are any pets around you, be aware of where they are.  Review your medicines with your doctor. Some medicines can make you feel dizzy. This can increase your chance of falling. Ask your doctor what other things that you can do to help prevent falls. This information is not intended to replace advice given to you by your health care provider. Make sure you discuss any questions you have with your health care provider. Document Released: 08/18/2009 Document Revised: 03/29/2016 Document Reviewed: 11/26/2014 Elsevier Interactive Patient Education  2017 [employer].

## 2020-11-11 NOTE — Progress Notes (Signed)
Subjective:   Amy Mcconnell is a 70 y.o. female who presents for Medicare Annual (Subsequent) preventive examination.  Review of Systems    No ROS.  Medicare Wellness Virtual Visit.    Cardiac Risk Factors include: advanced age (>33men, >65 women);hypertension     Objective:    Today's Vitals   11/11/20 1406  BP: 137/80  Weight: 140 lb (63.5 kg)  Height: 5' 7.5" (1.715 m)   Body mass index is 21.6 kg/m.  Advanced Directives 11/11/2020 11/10/2019 05/17/2019 04/14/2019 04/09/2019 11/07/2018 11/06/2017  Does Patient Have a Medical Advance Directive? No Yes No No No No No  Type of Advance Directive - Healthcare Power of [place];Living will - - - - -  Does patient want to make changes to medical advance directive? - No - Patient declined - - - - -  Copy of [place] in Chart? - No - copy requested - - - - -  Would patient like information on creating a medical advance directive? No - Patient declined - No - Patient declined No - Patient declined No - Patient declined No - Patient declined Yes (MAU/Ambulatory/Procedural Areas - Information given)    Current Medications (verified) Outpatient Encounter Medications as of 11/11/2020  Medication Sig  . ACETAMINOPHEN PO Take 650 mg by mouth in the morning and at bedtime.   Marland Kitchen amLODipine (NORVASC) 5 MG tablet Take 1 tablet (5 mg total) by mouth daily.  . Ascorbic Acid (VITAMIN C) 1000 MG tablet Take 1,000 mg by mouth daily.  Marland Kitchen aspirin 81 MG tablet Take 81 mg by mouth daily.  Marland Kitchen atorvastatin (LIPITOR) 80 MG tablet Take 1 tablet (80 mg total) by mouth daily.  . B Complex Vitamins (VITAMIN B COMPLEX PO) Take by mouth daily.  . calcium carbonate (OS-CAL) 600 MG TABS tablet Take 600 mg by mouth daily.   . Cholecalciferol (VITAMIN D3) 50 MCG (2000 UT) capsule Take 2,000 Units by mouth daily.  . diphenhydrAMINE (BENADRYL) 25 mg capsule Take 25 mg by mouth daily as needed for allergies. For food and fragrance sensitivities  . Docusate  Sodium (RA COL-RITE PO) Take by mouth.  . doxazosin (CARDURA) 8 MG tablet TAKE 1 TABLET BY MOUTH TWICE DAILY  . EPINEPHrine 0.3 mg/0.3 mL IJ SOAJ injection Inject 0.3 mLs (0.3 mg total) into the muscle once.  . fexofenadine (ALLEGRA) 180 MG tablet Take 180 mg by mouth. 2-3 times daily prn For food and fragrance sensitivities/intolerances  . Flaxseed, Linseed, (FLAXSEED OIL PO) Take by mouth.  . levothyroxine (SYNTHROID, LEVOTHROID) 125 MCG tablet Take 125 mcg by mouth daily before breakfast.  . lithium carbonate 150 MG capsule Take 150 mg by mouth 2 (two) times daily with a meal.  . magnesium oxide (MAG-OX) 400 MG tablet Take 400 mg by mouth daily.  . Multiple Vitamins-Minerals (MULTIVITAMIN WITH MINERALS) tablet Take 1 tablet by mouth daily.  . Multiple Vitamins-Minerals (VISION FORMULA/LUTEIN) TABS Take by mouth.  . Omega-3 Fatty Acids (FISH OIL) 1000 MG CAPS Take 3 capsules by mouth daily.   . Oxcarbazepine (TRILEPTAL) 300 MG tablet Take 300 mg one tablet in the am & two tablets in the pm.  . propranolol ER (INDERAL LA) 60 MG 24 hr capsule Take 1 capsule (60 mg total) by mouth daily.  . vitamin B-12 (CYANOCOBALAMIN) 1000 MCG tablet Take 1,000 mcg by mouth daily.  . vitamin E 1000 UNIT capsule Take 1,000 Units by mouth daily.  Marland Kitchen zinc gluconate 50 MG tablet Take  50 mg by mouth daily.  . ziprasidone (GEODON) 20 MG capsule Take 20 mg by mouth daily.   No facility-administered encounter medications on file as of 11/11/2020.    Allergies (verified) Ace inhibitors, Angiotensin receptor blockers, Benicar [olmesartan], Poison ivy extract [poison ivy extract], Purell instant hand [alcohol], and Triclosan   History: Past Medical History:  Diagnosis Date  . 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 Anxiety   . Bipolar disorder ([place])    Dr. Thurmond Butts - every 3 months  . Broken foot    left   . Contact dermatitis and other eczema due to other specified agent   . Contusion of unspecified part of  lower limb   . Essential hypertension, benign   . Heart murmur   . Other and unspecified hyperlipidemia   . Other specified disorders of thyroid   . Reflux esophagitis   . Tachycardia    a. isolated episode, seen in ED with negative work up   Past Surgical History:  Procedure Laterality Date  . CATARACT EXTRACTION    . SALPINGECTOMY Left   . STERILIZATION     Her decision since dz of bipolar   Family History  Problem Relation Age of Onset  . Arrhythmia Mother        A-Fib  . Heart failure Mother   . Hyperlipidemia Mother   . Hypertension Mother   . Alzheimer's disease Mother   . Hypertension Father   . Hyperlipidemia Father   . Mental retardation Father 85       Suicide  . Hypertension Brother   . Hyperlipidemia Brother   . Hypertension Brother   . Hyperlipidemia Brother   . Hyperlipidemia Brother   . Hypertension Brother   . Alcohol abuse Brother   . Depression Brother   . Hyperlipidemia Brother   . Hypertension Brother   . Breast cancer Maternal Aunt    Social History   Socioeconomic History  . Marital status: Divorced    Spouse name: Not on file  . Number of children: 0  . Years of education: 71  . Highest education level: Not on file  Occupational History  . Not on file  Tobacco Use  . Smoking status: Former Smoker    Packs/day: 0.50    Years: 13.00    Pack years: 6.50    Quit date: 01/23/2000    Years since quitting: 20.8  . Smokeless tobacco: Never Used  Vaping Use  . Vaping Use: Never used  Substance and Sexual Activity  . Alcohol use: No  . Drug use: No  . Sexual activity: Never  Other Topics Concern  . Not on file  Social History Narrative   Amy Mcconnell grew up in [place], [place]. She lives in [place]. Amy Mcconnell is divorced. She has 2 cats (Charlie and Angie). She volunteers at [employer]. She also volunteers 2 days at [facility]. She enjoys reading, garding and cooking.   Social Determinants of Health   Financial  Resource Strain: Low Risk   . Difficulty of Paying Living Expenses: Not hard at all  Food Insecurity: No Food Insecurity  . Worried About Charity fundraiser in the Last Year: Never true  . Ran Out of Food in the Last Year: Never true  Transportation Needs: No Transportation Needs  . Lack of Transportation (Medical): No  . Lack of Transportation (Non-Medical): No  Physical Activity: Sufficiently Active  . Days of Exercise per Week: 5 days  .  Minutes of Exercise per Session: 30 min  Stress: No Stress Concern Present  . Feeling of Stress : Not at all  Social Connections: Moderately Isolated  . Frequency of Communication with Friends and Family: More than three times a week  . Frequency of Social Gatherings with Friends and Family: Once a week  . Attends Religious Services: Never  . Active Member of Clubs or Organizations: Yes  . Attends Archivist Meetings: Never  . Marital Status: Divorced    Tobacco Counseling Counseling given: Not Answered   Clinical Intake:  Pre-visit preparation completed: Yes        Diabetes: No  How often do you need to have someone help you when you read instructions, pamphlets, or other written materials from your doctor or pharmacy?: 1 - Never   Interpreter Needed?: No      Activities of Daily Living In your present state of health, do you have any difficulty performing the following activities: 11/11/2020  Hearing? N  Vision? N  Difficulty concentrating or making decisions? N  Walking or climbing stairs? N  Dressing or bathing? N  Doing errands, shopping? N  Preparing Food and eating ? N  Using the Toilet? N  In the past six months, have you accidently leaked urine? Y  Do you have problems with loss of bowel control? N  Managing your Medications? N  Managing your Finances? Y  Comment Brother Engineer, mining? N  Some recent data might be hidden    Patient Care Team: Leone Haven,  MD as PCP - General (Family Medicine) Rockey Situ Kathlene November, MD as Consulting Physician (Cardiology)  Indicate any recent Medical Services you may have received from other than [facility] providers in the past year (date may be approximate).     Assessment:   This is a routine wellness examination for [place].  Attempt to connect with video unsuccessful from the start.  I connected with Amy Mcconnell today by telephone and verified that I am speaking with the correct person using two identifiers. Location patient: home Location provider: work Persons participating in the virtual visit: patient, Marine scientist.    I discussed the limitations, risks, security and privacy concerns of performing an evaluation and management service by telephone and the availability of in person appointments. The patient expressed understanding and verbally consented to this telephonic visit.    Interactive audio and video telecommunications were attempted between this provider and patient, however failed, due to patient having technical difficulties OR patient did not have access to video capability.  We continued and completed visit with audio only.  Some vital signs may be absent or patient reported.   Hearing/Vision screen  Hearing Screening   125Hz  250Hz  500Hz  1000Hz  2000Hz  3000Hz  4000Hz  6000Hz  8000Hz   Right ear:           Left ear:           Comments: Patient is able to hear conversational tones without difficulty. No issues reported.  Vision Screening Comments: Followed by [facility]  Wears corrective lenses  Annual visits  Cataract extraction, R eye  Visual acuity not assessed per patient preference since they have regular follow up with the ophthalmologist  Dietary issues and exercise activities discussed: Current Exercise Habits: Home exercise routine, Time (Minutes): 30, Frequency (Times/Week): 5, Weekly Exercise (Minutes/Week): 150, Intensity: Mild  Healthy diet Good water intke  Goals      Patient Stated    .  I want to lower  sodium intake (pt-stated)      Depression Screen PHQ 2/9 Scores 11/11/2020 05/30/2020 11/10/2019 08/07/2019 11/07/2018 10/08/2018 11/06/2017  PHQ - 2 Score 0 0 0 0 0 0 0  Exception Documentation - - - - - - -  Not completed - - - - - - -    Fall Risk Fall Risk  11/11/2020 09/09/2020 05/30/2020 02/08/2020 11/10/2019  Falls in the past year? 0 0 0 0 0  Comment - - - - -  Number falls in past yr: 0 - 0 - -  Comment - - - - -  Injury with Fall? 0 - - - -  Comment - - - - -  Follow up Falls evaluation completed Falls evaluation completed Falls evaluation completed - Falls prevention discussed    FALL RISK PREVENTION PERTAINING TO THE HOME: Handrails in use when climbing stairs? Yes Home free of loose throw rugs in walkways, pet beds, electrical cords, etc? Yes  Adequate lighting in your home to reduce risk of falls? Yes   ASSISTIVE DEVICES UTILIZED TO PREVENT FALLS: Life alert? No  Use of a cane, walker or w/c? No   TIMED UP AND GO: Was the test performed? No . Virtual visit.  Cognitive Function: MMSE - Mini Mental State Exam 11/06/2017  Orientation to time 5  Orientation to Place 5  Registration 3  Attention/ Calculation 5  Recall 3  Language- name 2 objects 2  Language- repeat 1  Language- follow 3 step command 3  Language- read & follow direction 1  Write a sentence 1  Copy design 1  Total score 30     6CIT Screen 11/11/2020 11/10/2019 11/07/2018 10/22/2016  What Year? 0 points 0 points 0 points 0 points  What month? 0 points 0 points 0 points 0 points  What time? 0 points 0 points 0 points 0 points  Count back from 20 0 points 0 points 0 points 0 points  Months in reverse 0 points 0 points 0 points 0 points  Repeat phrase - 0 points 0 points -  Total Score - 0 0 -    Immunizations Immunization History  Administered Date(s) Administered  . Influenza, High Dose Seasonal PF 07/23/2018, 08/23/2020  . Influenza-Unspecified 08/05/2014, 07/06/2016, 01/06/2020  .  PFIZER SARS-COV-2 Vaccination 11/23/2019, 12/15/2019, 08/26/2020  . Pneumococcal Conjugate-13 01/23/2016  . Pneumococcal Polysaccharide-23 08/22/2011, 01/16/2019  . Tdap 12/29/2014  . Zoster 12/29/2013  . Zoster Recombinat (Shingrix) 07/23/2018, 09/25/2018   Health Maintenance There are no preventive care reminders to display for this patient. Health Maintenance  Topic Date Due  . COLONOSCOPY (Pts 45-37yrs Insurance coverage will need to be confirmed)  11/06/2021  . MAMMOGRAM  05/18/2022  . TETANUS/TDAP  12/29/2024  . INFLUENZA VACCINE  Completed  . DEXA SCAN  Completed  . COVID-19 Vaccine  Completed  . Hepatitis C Screening  Completed  . PNA vac Low Risk Adult  Completed   Colorectal cancer screening: Type of screening: Colonoscopy. Completed 11/07/11. Repeat every 10 years  Mammogram status: Completed 05/08/20. Repeat every year.MM 3D SCREEN BREAST BILATERAL   Lung Cancer Screening: (Low Dose CT Chest recommended if Age 34-80 years, 30 pack-year currently smoking OR have quit w/in 15years.) does not qualify.   Hepatitis C Screening: Completed 04/06/16.  Vision Screening: Recommended annual ophthalmology exams for early detection of glaucoma and other disorders of the eye. Is the patient up to date with their annual eye exam?  Yes  Who is the provider or what is the  name of the office in which the patient attends annual eye exams? [facility].   Dental Screening: Recommended annual dental exams for proper oral hygiene. Visits every 6 months.   Community Resource Referral / Chronic Care Management: CRR required this visit?  No   CCM required this visit?  No      Plan:   Keep all routine maintenance appointments.   Patient requests referral to any female Urologist in [place] or her current OBGYN, Dr. Georga Bora [phone] for leaking bladder x1 year. Notes it is worsening but had not mentioned to pcp out of embarrassment. Managed with daily pads.   Follow up 11/30/20 @  2:15  I have personally reviewed and noted the following in the patient's chart:   . Medical and social history . Use of alcohol, tobacco or illicit drugs  . Current medications and supplements . Functional ability and status . Nutritional status . Physical activity . Advanced directives . List of other physicians . Hospitalizations, surgeries, and ER visits in previous 12 months . Vitals . Screenings to include cognitive, depression, and falls . Referrals and appointments  In addition, I have reviewed and discussed with patient certain preventive protocols, quality metrics, and best practice recommendations. A written personalized care plan for preventive services as well as general preventive health recommendations were provided to patient via mail.     Varney Biles, LPN   12/12/5168

## 2020-11-13 NOTE — Addendum Note (Signed)
Addended by: Leone Haven on: 11/13/2020 03:17 PM   Modules accepted: Orders

## 2020-11-13 NOTE — Progress Notes (Signed)
I have reviewed the above note and agree. Urology referral placed.   Tommi Rumps, M.D.

## 2020-11-25 ENCOUNTER — Ambulatory Visit: Payer: Medicare HMO | Admitting: Cardiovascular Disease

## 2020-11-28 ENCOUNTER — Ambulatory Visit: Payer: Self-pay | Admitting: Urology

## 2020-11-30 ENCOUNTER — Other Ambulatory Visit: Payer: Self-pay | Admitting: Cardiovascular Disease

## 2020-11-30 ENCOUNTER — Ambulatory Visit: Payer: Medicare HMO | Admitting: Family Medicine

## 2020-12-02 ENCOUNTER — Telehealth: Payer: Self-pay | Admitting: Family Medicine

## 2020-12-02 NOTE — Telephone Encounter (Signed)
Spoken to patient, she stated she has urinary incontinency. She stated she wears a pad and she noticed small blood on pad. She does not have any other sx, she would like an appointment. Appointment has been scheduled.

## 2020-12-02 NOTE — Telephone Encounter (Signed)
Patient stated she went to the bathroom and when she wiped she had red blood on the toilet paper, no pain. Patient would like to give a urine sample.

## 2020-12-05 ENCOUNTER — Other Ambulatory Visit: Payer: Self-pay

## 2020-12-05 ENCOUNTER — Ambulatory Visit: Payer: Self-pay | Admitting: Urology

## 2020-12-05 ENCOUNTER — Encounter: Payer: Self-pay | Admitting: Family

## 2020-12-05 ENCOUNTER — Ambulatory Visit (INDEPENDENT_AMBULATORY_CARE_PROVIDER_SITE_OTHER): Payer: Medicare HMO | Admitting: Family

## 2020-12-05 VITALS — BP 108/74 | HR 65 | Temp 98.4°F | Ht 67.5 in | Wt 144.6 lb

## 2020-12-05 DIAGNOSIS — R319 Hematuria, unspecified: Secondary | ICD-10-CM | POA: Insufficient documentation

## 2020-12-05 LAB — URINALYSIS, ROUTINE W REFLEX MICROSCOPIC
Bilirubin Urine: NEGATIVE
Hgb urine dipstick: NEGATIVE
Ketones, ur: NEGATIVE
Leukocytes,Ua: NEGATIVE
Nitrite: NEGATIVE
RBC / HPF: NONE SEEN (ref 0–?)
Specific Gravity, Urine: 1.015 (ref 1.000–1.030)
Total Protein, Urine: NEGATIVE
Urine Glucose: NEGATIVE
Urobilinogen, UA: 0.2 (ref 0.0–1.0)
pH: 6.5 (ref 5.0–8.0)

## 2020-12-05 NOTE — Progress Notes (Signed)
Subjective:    Patient ID: Amy Mcconnell, female    DOB: 11/09/1950, 70 y.o.   MRN: [id number]  CC: Amy Mcconnell is a 70 y.o. female who presents today for an acute visit.    HPI: CC: one episode of suspected hematuria.   Urine with blood 3 days ago, since resolved.  Noted 'pink tinge' on pad which she wears for urinary incontinence.  No h/o hemorrhoids. No appreciated vaginal or rectal bleeding   No flank pain, urinary frequency, dysuria.    H/o htn, stress incontinence  No h/o ckd or recent uti  Dr Matilde Sprang - next month for uninary incontinence  colonoscopy UTD  Former smoker - quit 22 years ago.   HISTORY:  Past Medical History:  Diagnosis Date  . 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 Anxiety   . Bipolar disorder ([place])    Dr. Thurmond Butts - every 3 months  . Broken foot    left   . Contact dermatitis and other eczema due to other specified agent   . Contusion of unspecified part of lower limb   . Essential hypertension, benign   . Heart murmur   . Other and unspecified hyperlipidemia   . Other specified disorders of thyroid   . Reflux esophagitis   . Tachycardia    a. isolated episode, seen in ED with negative work up   Past Surgical History:  Procedure Laterality Date  . CATARACT EXTRACTION    . SALPINGECTOMY Left   . STERILIZATION     Her decision since dz of bipolar   Family History  Problem Relation Age of Onset  . Arrhythmia Mother        A-Fib  . Heart failure Mother   . Hyperlipidemia Mother   . Hypertension Mother   . Alzheimer's disease Mother   . Hypertension Father   . Hyperlipidemia Father   . Mental retardation Father 17       Suicide  . Hypertension Brother   . Hyperlipidemia Brother   . Hypertension Brother   . Hyperlipidemia Brother   . Hyperlipidemia Brother   . Hypertension Brother   . Alcohol abuse Brother   . Depression Brother   . Hyperlipidemia Brother   . Hypertension Brother   . Breast cancer Maternal  Aunt     Allergies: Ace inhibitors, Angiotensin receptor blockers, Benicar [olmesartan], Poison ivy extract [poison ivy extract], Purell instant hand [alcohol], and Triclosan Current Outpatient Medications on File Prior to Visit  Medication Sig Dispense Refill  . ACETAMINOPHEN PO Take 650 mg by mouth in the morning and at bedtime.     Marland Kitchen amLODipine (NORVASC) 5 MG tablet Take 1 tablet (5 mg total) by mouth daily. 90 tablet 3  . Ascorbic Acid (VITAMIN C) 1000 MG tablet Take 1,000 mg by mouth daily.    Marland Kitchen aspirin 81 MG tablet Take 81 mg by mouth daily.    Marland Kitchen atorvastatin (LIPITOR) 80 MG tablet Take 1 tablet (80 mg total) by mouth daily. 90 tablet 3  . B Complex Vitamins (VITAMIN B COMPLEX PO) Take by mouth daily.    . calcium carbonate (OS-CAL) 600 MG TABS tablet Take 600 mg by mouth daily.     . Cholecalciferol (VITAMIN D3) 50 MCG (2000 UT) capsule Take 2,000 Units by mouth daily.    . diphenhydrAMINE (BENADRYL) 25 mg capsule Take 25 mg by mouth daily as needed for allergies. For food and fragrance sensitivities    . Docusate Sodium (  RA COL-RITE PO) Take by mouth.    . doxazosin (CARDURA) 8 MG tablet Take 1 tablet by mouth twice daily 180 tablet 0  . EPINEPHrine 0.3 mg/0.3 mL IJ SOAJ injection Inject 0.3 mLs (0.3 mg total) into the muscle once. 2 Device 0  . fexofenadine (ALLEGRA) 180 MG tablet Take 180 mg by mouth. 2-3 times daily prn For food and fragrance sensitivities/intolerances    . Flaxseed, Linseed, (FLAXSEED OIL PO) Take by mouth.    . levothyroxine (SYNTHROID, LEVOTHROID) 125 MCG tablet Take 125 mcg by mouth daily before breakfast.    . lithium carbonate 150 MG capsule Take 150 mg by mouth 2 (two) times daily with a meal.    . magnesium oxide (MAG-OX) 400 MG tablet Take 400 mg by mouth daily.    . Multiple Vitamins-Minerals (MULTIVITAMIN WITH MINERALS) tablet Take 1 tablet by mouth daily.    . Multiple Vitamins-Minerals (VISION FORMULA/LUTEIN) TABS Take by mouth.    . Omega-3 Fatty  Acids (FISH OIL) 1000 MG CAPS Take 3 capsules by mouth daily.     . Oxcarbazepine (TRILEPTAL) 300 MG tablet Take 300 mg one tablet in the am & two tablets in the pm.    . propranolol ER (INDERAL LA) 60 MG 24 hr capsule Take 1 capsule (60 mg total) by mouth daily. 90 capsule 4  . vitamin B-12 (CYANOCOBALAMIN) 1000 MCG tablet Take 1,000 mcg by mouth daily.    . vitamin E 1000 UNIT capsule Take 1,000 Units by mouth daily.    Marland Kitchen zinc gluconate 50 MG tablet Take 50 mg by mouth daily.    . ziprasidone (GEODON) 20 MG capsule Take 20 mg by mouth daily.     No current facility-administered medications on file prior to visit.    Social History   Tobacco Use  . Smoking status: Former Smoker    Packs/day: 0.50    Years: 13.00    Pack years: 6.50    Quit date: 01/23/2000    Years since quitting: 20.8  . Smokeless tobacco: Never Used  Vaping Use  . Vaping Use: Never used  Substance Use Topics  . Alcohol use: No  . Drug use: No    Review of Systems  Constitutional: Negative for chills and fever.  Respiratory: Negative for cough.   Cardiovascular: Negative for chest pain and palpitations.  Gastrointestinal: Negative for anal bleeding, blood in stool, nausea and vomiting.  Genitourinary: Positive for hematuria. Negative for decreased urine volume, difficulty urinating, vaginal bleeding and vaginal pain.      Objective:    BP 108/74 (BP Location: Left Arm, Patient Position: Sitting, Cuff Size: Normal)   Pulse 65   Temp 98.4 F (36.9 C) (Oral)   Ht 5' 7.5" (1.715 m)   Wt 144 lb 9.6 oz (65.6 kg)   SpO2 98%   BMI 22.31 kg/m    Physical Exam Vitals reviewed.  Constitutional:      Appearance: She is well-developed and well-nourished.  Cardiovascular:     Rate and Rhythm: Normal rate and regular rhythm.     Pulses: Normal pulses.     Heart sounds: Normal heart sounds.  Pulmonary:     Effort: Pulmonary effort is normal.     Breath sounds: Normal breath sounds. No wheezing, rhonchi or  rales.  Abdominal:     Tenderness: There is no CVA tenderness.  Skin:    General: Skin is warm and dry.  Neurological:     Mental Status: She is alert.  Psychiatric:        Mood and Affect: Mood and affect normal.        Speech: Speech normal.        Behavior: Behavior normal.        Thought Content: Thought content normal.        Assessment & Plan:   Problem List Items Addressed This Visit      Other   Hematuria - Primary    Frank episode of suspected hematuria. Pending UA today and then again in 6 weeks time to monitor for recurrence. She has upcoming appointment with urology which works well with continued conversation, surveillance around this episode.       Relevant Orders   Urinalysis, Routine w reflex microscopic   Urine Culture   POCT urinalysis dipstick   Urinalysis, Routine w reflex microscopic        I am having Lowry Bowl. Lovena Le maintain her levothyroxine, aspirin, zinc gluconate, vitamin B-12, vitamin E, Fish Oil, B Complex Vitamins (VITAMIN B COMPLEX PO), fexofenadine, Oxcarbazepine, calcium carbonate, magnesium oxide, vitamin C, Vision Formula/Lutein, multivitamin with minerals, diphenhydrAMINE, ACETAMINOPHEN PO, EPINEPHrine, lithium carbonate, (Flaxseed, Linseed, (FLAXSEED OIL PO)), Vitamin D3, ziprasidone, amLODipine, atorvastatin, propranolol ER, Docusate Sodium (RA COL-RITE PO), and doxazosin.   No orders of the defined types were placed in this encounter.   Return precautions given.   Risks, benefits, and alternatives of the medications and treatment plan prescribed today were discussed, and patient expressed understanding.   Education regarding symptom management and diagnosis given to patient on AVS.  Continue to follow with Leone Haven, MD for routine health maintenance.   Amy Mcconnell and I agreed with plan.   Mable Paris, FNP

## 2020-12-05 NOTE — Assessment & Plan Note (Signed)
Frank episode of suspected hematuria. Pending UA today and then again in 6 weeks time to monitor for recurrence. She has upcoming appointment with urology which works well with continued conversation, surveillance around this episode.

## 2020-12-06 LAB — URINE CULTURE
MICRO NUMBER:: 11475749
Result:: NO GROWTH
SPECIMEN QUALITY:: ADEQUATE
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
## 2020-12-19 ENCOUNTER — Other Ambulatory Visit: Payer: Self-pay

## 2020-12-19 ENCOUNTER — Encounter: Payer: Self-pay | Admitting: Cardiovascular Disease

## 2020-12-19 ENCOUNTER — Ambulatory Visit (INDEPENDENT_AMBULATORY_CARE_PROVIDER_SITE_OTHER): Payer: Medicare HMO | Admitting: Cardiovascular Disease

## 2020-12-19 VITALS — BP 130/80 | HR 60 | Ht 67.5 in | Wt 145.0 lb

## 2020-12-19 DIAGNOSIS — R079 Chest pain, unspecified: Secondary | ICD-10-CM | POA: Diagnosis not present

## 2020-12-19 DIAGNOSIS — I10 Essential (primary) hypertension: Secondary | ICD-10-CM | POA: Diagnosis not present

## 2020-12-19 DIAGNOSIS — Z87891 Personal history of nicotine dependence: Secondary | ICD-10-CM | POA: Diagnosis not present

## 2020-12-19 DIAGNOSIS — E7800 Pure hypercholesterolemia, unspecified: Secondary | ICD-10-CM

## 2020-12-19 DIAGNOSIS — I479 Paroxysmal tachycardia, unspecified: Secondary | ICD-10-CM

## 2020-12-19 NOTE — Patient Instructions (Addendum)
Medication Instructions:  No changes  If you need a refill on your cardiac medications before your next appointment, please call your pharmacy.    Lab work: No new labs needed   If you have labs (blood work) drawn today and your tests are completely normal, you will receive your results only by: . MyChart Message (if you have MyChart) OR . A paper copy in the mail If you have any lab test that is abnormal or we need to change your treatment, we will call you to review the results.   Testing/Procedures: No new testing needed   Follow-Up: At [facility], you and your health needs are our priority.  As part of our continuing mission to provide you with exceptional heart care, we have created designated Provider Care Teams.  These Care Teams include your primary Cardiologist (physician) and Advanced Practice Providers (APPs -  Physician Assistants and Nurse Practitioners) who all work together to provide you with the care you need, when you need it.  . You will need a follow up appointment in 12 months  . Providers on your designated Care Team:   . Christopher Berge, NP . Ryan Dunn, PA-C . Jacquelyn Visser, PA-C  Any Other Special Instructions Will Be Listed Below (If Applicable).  COVID-19 Vaccine Information can be found at: [email or web address] For questions related to vaccine distribution or appointments, please email [email or web address] or call [phone].     

## 2020-12-21 ENCOUNTER — Other Ambulatory Visit: Payer: Self-pay | Admitting: Cardiovascular Disease

## 2020-12-26 ENCOUNTER — Ambulatory Visit: Payer: Self-pay | Admitting: Urology

## 2020-12-26 ENCOUNTER — Ambulatory Visit: Payer: Medicare HMO | Admitting: Family Medicine

## 2021-01-11 ENCOUNTER — Ambulatory Visit: Payer: Medicare HMO | Admitting: Family Medicine

## 2021-01-22 ENCOUNTER — Other Ambulatory Visit: Payer: Self-pay | Admitting: Cardiovascular Disease

## 2021-01-23 ENCOUNTER — Ambulatory Visit: Payer: Self-pay | Admitting: Urology

## 2021-01-27 ENCOUNTER — Other Ambulatory Visit: Payer: Self-pay | Admitting: Cardiovascular Disease

## 2021-01-27 ENCOUNTER — Telehealth: Payer: Self-pay | Admitting: Cardiovascular Disease

## 2021-01-27 NOTE — Telephone Encounter (Signed)
Patient calling to make office aware  States that she accidentally got her pill pak wet which messed up most of her medication She will be needing extra prescriptions sent in at the end of the month for doxazosin and atorvastatin (patient does not want them sent in yet, she will call) Patient is aware she will most likely have to pay out of pocket

## 2021-01-30 DIAGNOSIS — Z79899 Other long term (current) drug therapy: Secondary | ICD-10-CM | POA: Diagnosis not present

## 2021-02-11 ENCOUNTER — Other Ambulatory Visit: Payer: Self-pay | Admitting: Cardiovascular Disease

## 2021-02-14 ENCOUNTER — Telehealth: Payer: Self-pay | Admitting: Family Medicine

## 2021-02-14 NOTE — Telephone Encounter (Signed)
This patient needs follow-up with me for her blood pressure and cholesterol.  I also received a fax from her insurance encouraging [place] to order a bone density scan given that she has a recent broken bone in her foot.  Would she be willing to proceed with a bone density scan?  If needed I can discuss with her during the office visit.

## 2021-02-15 NOTE — Telephone Encounter (Signed)
Patient is scheduled.  Caidence Kaseman,cma  

## 2021-03-13 ENCOUNTER — Telehealth: Payer: Self-pay | Admitting: Family Medicine

## 2021-03-13 NOTE — Telephone Encounter (Signed)
April from [facility] called to inquire if we have received a fax about Bone Density Testing for PT

## 2021-03-13 NOTE — Telephone Encounter (Signed)
Bone density testing from [facility] received and put in the basket for the provider to sign.  Jovanna Hodges,cma

## 2021-03-15 ENCOUNTER — Encounter: Payer: Self-pay | Admitting: Family Medicine

## 2021-03-15 ENCOUNTER — Other Ambulatory Visit: Payer: Self-pay

## 2021-03-15 ENCOUNTER — Ambulatory Visit (INDEPENDENT_AMBULATORY_CARE_PROVIDER_SITE_OTHER): Payer: Medicare HMO | Admitting: Family Medicine

## 2021-03-15 DIAGNOSIS — R319 Hematuria, unspecified: Secondary | ICD-10-CM

## 2021-03-15 DIAGNOSIS — E7800 Pure hypercholesterolemia, unspecified: Secondary | ICD-10-CM | POA: Diagnosis not present

## 2021-03-15 DIAGNOSIS — S92216A Nondisplaced fracture of cuboid bone of unspecified foot, initial encounter for closed fracture: Secondary | ICD-10-CM

## 2021-03-15 DIAGNOSIS — S92213A Displaced fracture of cuboid bone of unspecified foot, initial encounter for closed fracture: Secondary | ICD-10-CM | POA: Insufficient documentation

## 2021-03-15 DIAGNOSIS — I10 Essential (primary) hypertension: Secondary | ICD-10-CM | POA: Diagnosis not present

## 2021-03-15 NOTE — Assessment & Plan Note (Signed)
She never had evaluation for this.  Referral placed to urology.  Discussed the need to evaluate for potential underlying causes.

## 2021-03-15 NOTE — Progress Notes (Signed)
Tommi Rumps, MD Phone: [phone]  Amy Mcconnell is a 70 y.o. female who presents today for follow-up.  HYPERTENSION  Disease Monitoring  Home BP Monitoring not checking Chest pain- no    Dyspnea- no Medications  Compliance-  Taking amlodipine, cardura, propranolol.  Edema- only when she has to use purrell at work  HYPERLIPIDEMIA Symptoms Chest pain on exertion:  no   Medications: Compliance- taking lipitor Right upper quadrant pain- no  Muscle aches- no  Foot fracture: She saw podiatry for this.  She had a bone chip in one of her feet.  Her insurance company sent several faxes about getting her set up for a DEXA scan and it appears that the staff did not communicate that to the patient when they called her to get her set up for this visit.  Hematuria: Patient notes that she had 1 episode of a slight pink tinge to her urine on the pad that she uses.  She has not seen urology.  This has not recurred.  She notes no vaginal bleeding.  Social History   Tobacco Use  Smoking Status Former Smoker  . Packs/day: 0.50  . Years: 13.00  . Pack years: 6.50  . Quit date: 01/23/2000  . Years since quitting: 21.1  Smokeless Tobacco Never Used    Current Outpatient Medications on File Prior to Visit  Medication Sig Dispense Refill  . ACETAMINOPHEN PO Take 650 mg by mouth in the morning and at bedtime.     Marland Kitchen amLODipine (NORVASC) 5 MG tablet Take 1 tablet by mouth once daily 90 tablet 2  . Ascorbic Acid (VITAMIN C) 1000 MG tablet Take 1,000 mg by mouth daily.    Marland Kitchen aspirin 81 MG tablet Take 81 mg by mouth daily.    Marland Kitchen atorvastatin (LIPITOR) 80 MG tablet Take 1 tablet by mouth once daily 90 tablet 3  . B Complex Vitamins (VITAMIN B COMPLEX PO) Take by mouth daily.    . calcium carbonate (OS-CAL) 600 MG TABS tablet Take 600 mg by mouth daily.     . Cholecalciferol (VITAMIN D3) 50 MCG (2000 UT) capsule Take 2,000 Units by mouth daily.    . diphenhydrAMINE (BENADRYL) 25 mg capsule Take 25  mg by mouth daily as needed for allergies. For food and fragrance sensitivities    . Docusate Sodium (RA COL-RITE PO) Take 1 capsule by mouth daily.    Marland Kitchen doxazosin (CARDURA) 8 MG tablet Take 1 tablet by mouth twice daily 180 tablet 3  . EPINEPHrine 0.3 mg/0.3 mL IJ SOAJ injection Inject 0.3 mLs (0.3 mg total) into the muscle once. 2 Device 0  . fexofenadine (ALLEGRA) 180 MG tablet Take 180 mg by mouth. 2-3 times daily prn For food and fragrance sensitivities/intolerances    . Flaxseed, Linseed, (FLAXSEED OIL PO) Take by mouth daily.    Marland Kitchen levothyroxine (SYNTHROID, LEVOTHROID) 125 MCG tablet Take 125 mcg by mouth daily before breakfast.    . lithium carbonate 150 MG capsule Take 150 mg by mouth 2 (two) times daily with a meal.    . magnesium oxide (MAG-OX) 400 MG tablet Take 400 mg by mouth daily.    . Multiple Vitamins-Minerals (MULTIVITAMIN WITH MINERALS) tablet Take 1 tablet by mouth daily.    . Multiple Vitamins-Minerals (VISION FORMULA/LUTEIN) TABS Take by mouth in the morning and at bedtime.    . Omega-3 Fatty Acids (FISH OIL) 1000 MG CAPS Take 3 capsules by mouth daily.     . Oxcarbazepine (TRILEPTAL) 300  MG tablet Take 300 mg one tablet in the am & two tablets in the pm.    . propranolol ER (INDERAL LA) 60 MG 24 hr capsule Take 1 capsule by mouth once daily 90 capsule 3  . vitamin B-12 (CYANOCOBALAMIN) 1000 MCG tablet Take 1,000 mcg by mouth daily.    . vitamin E 1000 UNIT capsule Take 1,000 Units by mouth daily.    Marland Kitchen zinc gluconate 50 MG tablet Take 50 mg by mouth daily.    . ziprasidone (GEODON) 20 MG capsule Take 20 mg by mouth daily.     No current facility-administered medications on file prior to visit.     ROS see history of present illness  Objective  Physical Exam Vitals:   03/15/21 1439  BP: 130/80  Pulse: 66  Temp: 98.5 F (36.9 C)  SpO2: 98%    BP Readings from Last 3 Encounters:  03/15/21 130/80  12/19/20 130/80  12/05/20 108/74   Wt Readings from Last 3  Encounters:  03/15/21 145 lb 12.8 oz (66.1 kg)  12/19/20 145 lb (65.8 kg)  12/05/20 144 lb 9.6 oz (65.6 kg)    Physical Exam Constitutional:      General: She is not in acute distress.    Appearance: She is not diaphoretic.  Cardiovascular:     Rate and Rhythm: Normal rate and regular rhythm.     Heart sounds: Normal heart sounds.  Pulmonary:     Effort: Pulmonary effort is normal.     Breath sounds: Normal breath sounds.  Musculoskeletal:     Right lower leg: No edema.     Left lower leg: No edema.  Skin:    General: Skin is warm and dry.  Neurological:     Mental Status: She is alert.      Assessment/Plan: Please see individual problem list.  Problem List Items Addressed This Visit    Hyperlipidemia    She will continue Lipitor 80 mg once daily.      Essential hypertension    Adequate control for age.  She will continue amlodipine 5 mg once daily, Cardura 8 mg twice daily, and propranolol 60 mg daily.      Hematuria    She never had evaluation for this.  Referral placed to urology.  Discussed the need to evaluate for potential underlying causes.      Relevant Orders   Ambulatory referral to Urology   Cuboid fracture    She has seen podiatry for this.  Seems to be doing well at this time.  Bone density scan ordered given fracture history.  The patient knows to call to schedule this.      Relevant Orders   DG Bone Density      Return in about 3 months (around 06/15/2021) for Blood pressure follow-up and labs.  This visit occurred during the SARS-CoV-2 public health emergency.  Safety protocols were in place, including screening questions prior to the visit, additional usage of staff PPE, and extensive cleaning of exam room while observing appropriate contact time as indicated for disinfecting solutions.    Tommi Rumps, MD [place]

## 2021-03-15 NOTE — Assessment & Plan Note (Signed)
She will continue Lipitor 80 mg once daily.

## 2021-03-15 NOTE — Assessment & Plan Note (Signed)
She has seen podiatry for this.  Seems to be doing well at this time.  Bone density scan ordered given fracture history.  The patient knows to call to schedule this.

## 2021-03-15 NOTE — Assessment & Plan Note (Signed)
Adequate control for age.  She will continue amlodipine 5 mg once daily, Cardura 8 mg twice daily, and propranolol 60 mg daily.

## 2021-03-15 NOTE — Patient Instructions (Signed)
Nice to see you. Somebody from urology should contact you.  If you do not hear anything within the next 1 to 2 weeks please let us know. Please call [phone] to schedule your bone density scan.

## 2021-03-24 ENCOUNTER — Encounter: Payer: Self-pay | Admitting: Urology

## 2021-03-24 ENCOUNTER — Other Ambulatory Visit: Payer: Self-pay

## 2021-03-24 ENCOUNTER — Ambulatory Visit (INDEPENDENT_AMBULATORY_CARE_PROVIDER_SITE_OTHER): Payer: Medicare HMO | Admitting: Urology

## 2021-03-24 VITALS — BP 120/87 | HR 70 | Ht 67.0 in | Wt 140.0 lb

## 2021-03-24 DIAGNOSIS — R310 Gross hematuria: Secondary | ICD-10-CM

## 2021-03-24 LAB — URINALYSIS, COMPLETE
Bilirubin, UA: NEGATIVE
Glucose, UA: NEGATIVE
Ketones, UA: NEGATIVE
Leukocytes,UA: NEGATIVE
Nitrite, UA: NEGATIVE
Protein,UA: NEGATIVE
RBC, UA: NEGATIVE
Specific Gravity, UA: 1.01 (ref 1.005–1.030)
Urobilinogen, Ur: 0.2 mg/dL (ref 0.2–1.0)
pH, UA: 6.5 (ref 5.0–7.5)

## 2021-03-24 LAB — MICROSCOPIC EXAMINATION
Bacteria, UA: NONE SEEN
Epithelial Cells (non renal): NONE SEEN /hpf (ref 0–10)
RBC, Urine: NONE SEEN /hpf (ref 0–2)
WBC, UA: NONE SEEN /hpf (ref 0–5)

## 2021-03-24 NOTE — Progress Notes (Signed)
03/24/2021 1:25 PM   Amy Mcconnell 12/17/1950 [id number]  Referring provider: Leone Haven, MD [address] [place],  [place] [address]  Chief Complaint  Patient presents with  . Hematuria    HPI: Amy Mcconnell is a 70 y.o. female referred for evaluation of hematuria.   Blood spotting on pad  No gross hematuria  No blood on tissue noted when wiping  Urinalysis Dr. Ellen Henri office unremarkable  Has urinary urgency  Denies prior urologic history   PMH: Past Medical History:  Diagnosis Date  . 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 Anxiety   . Bipolar disorder ([place])    Dr. Thurmond Butts - every 3 months  . Broken foot    left   . Contact dermatitis and other eczema due to other specified agent   . Contusion of unspecified part of lower limb   . Essential hypertension, benign   . Heart murmur   . Other and unspecified hyperlipidemia   . Other specified disorders of thyroid   . Reflux esophagitis   . Tachycardia    a. isolated episode, seen in ED with negative work up    Surgical History: Past Surgical History:  Procedure Laterality Date  . CATARACT EXTRACTION    . SALPINGECTOMY Left   . STERILIZATION     Her decision since dz of bipolar    Home Medications:  Allergies as of 03/24/2021      Reactions   Ace Inhibitors    Angioedema   Angiotensin Receptor Blockers    Angioedema   Benicar [olmesartan]    Mouth & Lip Swelling, "I swell from the waist down". Throat swelling   Poison Ivy Extract [poison Ivy Extract]    Blisters   Purell Instant Hand [alcohol] Swelling   Triclosan       Medication List       Accurate as of Mar 24, 2021  1:25 PM. If you have any questions, ask your nurse or doctor.        ACETAMINOPHEN PO Take 650 mg by mouth in the morning and at bedtime.   amLODipine 5 MG tablet Commonly known as: NORVASC Take 1 tablet by mouth once daily   aspirin 81 MG tablet Take 81 mg by mouth daily.    atorvastatin 80 MG tablet Commonly known as: LIPITOR Take 1 tablet by mouth once daily   calcium carbonate 600 MG Tabs tablet Commonly known as: OS-CAL Take 600 mg by mouth daily.   diphenhydrAMINE 25 mg capsule Commonly known as: BENADRYL Take 25 mg by mouth daily as needed for allergies. For food and fragrance sensitivities   doxazosin 8 MG tablet Commonly known as: CARDURA Take 1 tablet by mouth twice daily   EPINEPHrine 0.3 mg/0.3 mL Soaj injection Commonly known as: EPI-PEN Inject 0.3 mLs (0.3 mg total) into the muscle once.   fexofenadine 180 MG tablet Commonly known as: ALLEGRA Take 180 mg by mouth. 2-3 times daily prn For food and fragrance sensitivities/intolerances   Fish Oil 1000 MG Caps Take 3 capsules by mouth daily.   FLAXSEED OIL PO Take by mouth daily.   levothyroxine 125 MCG tablet Commonly known as: SYNTHROID Take 125 mcg by mouth daily before breakfast.   lithium carbonate 150 MG capsule Take 150 mg by mouth 2 (two) times daily with a meal.   magnesium oxide 400 MG tablet Commonly known as: MAG-OX Take 400 mg by mouth daily.   Oxcarbazepine 300 MG tablet Commonly  known as: TRILEPTAL Take 300 mg one tablet in the am & two tablets in the pm.   propranolol ER 60 MG 24 hr capsule Commonly known as: INDERAL LA Take 1 capsule by mouth once daily   RA COL-RITE PO Take 1 capsule by mouth daily.   Vision Formula/Lutein Tabs Take by mouth in the morning and at bedtime.   multivitamin with minerals tablet Take 1 tablet by mouth daily.   VITAMIN B COMPLEX PO Take by mouth daily.   vitamin B-12 1000 MCG tablet Commonly known as: CYANOCOBALAMIN Take 1,000 mcg by mouth daily.   vitamin C 1000 MG tablet Take 1,000 mg by mouth daily.   Vitamin D3 50 MCG (2000 UT) capsule Take 2,000 Units by mouth daily.   vitamin E 1000 UNIT capsule Take 1,000 Units by mouth daily.   zinc gluconate 50 MG tablet Take 50 mg by mouth daily.   ziprasidone  20 MG capsule Commonly known as: GEODON Take 20 mg by mouth daily.       Allergies:  Allergies  Allergen Reactions  . Ace Inhibitors     Angioedema   . Angiotensin Receptor Blockers     Angioedema   . Benicar [Olmesartan]     Mouth & Lip Swelling, "I swell from the waist down". Throat swelling  . Poison Ivy Extract [Poison Ivy Extract]     Blisters  . Purell Instant Hand [Alcohol] Swelling  . Triclosan     Family History: Family History  Problem Relation Age of Onset  . Arrhythmia Mother        A-Fib  . Heart failure Mother   . Hyperlipidemia Mother   . Hypertension Mother   . Alzheimer's disease Mother   . Hypertension Father   . Hyperlipidemia Father   . Mental retardation Father 88       Suicide  . Hypertension Brother   . Hyperlipidemia Brother   . Hypertension Brother   . Hyperlipidemia Brother   . Hyperlipidemia Brother   . Hypertension Brother   . Alcohol abuse Brother   . Depression Brother   . Hyperlipidemia Brother   . Hypertension Brother   . Breast cancer Maternal Aunt     Social History:  reports that she quit smoking about 21 years ago. She has a 6.50 pack-year smoking history. She has never used smokeless tobacco. She reports that she does not drink alcohol and does not use drugs.   Physical Exam: There were no vitals taken for this visit.  Constitutional:  Alert and oriented, No acute distress. HEENT: [place] AT, moist mucus membranes.  Trachea midline, no masses. Cardiovascular: No clubbing, cyanosis, or edema. Respiratory: Normal respiratory effort, no increased work of breathing. Neurologic: Grossly intact, no focal deficits, moving all 4 extremities. Psychiatric: Normal mood and affect.  Laboratory Data:  Urinalysis Dipstick/microscopy negative   Assessment & Plan:    1.  Vaginal spotting  Single episode of spotting noted on a pad.  Urinalysis x2 have been negative  We discussed possible sources including urethral spotting,  vaginal spotting or source from the external genitalia  She desires to schedule cystoscopy to evaluate for possible urinary source  Pelvic exam at time of cystoscopy   Abbie Sons, MD  [facility] [address], [place], [place] [address] [phone]

## 2021-03-27 ENCOUNTER — Encounter: Payer: Self-pay | Admitting: Urology

## 2021-04-07 DIAGNOSIS — F3111 Bipolar disorder, current episode manic without psychotic features, mild: Secondary | ICD-10-CM | POA: Diagnosis not present

## 2021-04-11 ENCOUNTER — Other Ambulatory Visit: Payer: Self-pay

## 2021-04-11 ENCOUNTER — Ambulatory Visit
Admission: RE | Admit: 2021-04-11 | Discharge: 2021-04-11 | Disposition: A | Discharge status: Home / Self Care | Payer: Medicare HMO | Source: Ambulatory Visit | Attending: Family Medicine | Admitting: Family Medicine

## 2021-04-11 DIAGNOSIS — Z780 Asymptomatic menopausal state: Secondary | ICD-10-CM | POA: Diagnosis not present

## 2021-04-11 DIAGNOSIS — S92216A Nondisplaced fracture of cuboid bone of unspecified foot, initial encounter for closed fracture: Secondary | ICD-10-CM | POA: Diagnosis not present

## 2021-04-13 ENCOUNTER — Telehealth: Payer: Self-pay

## 2021-04-13 NOTE — Telephone Encounter (Signed)
LMTCB

## 2021-04-14 ENCOUNTER — Other Ambulatory Visit: Payer: Self-pay | Admitting: Family Medicine

## 2021-04-14 DIAGNOSIS — Z1231 Encounter for screening mammogram for malignant neoplasm of breast: Secondary | ICD-10-CM

## 2021-04-28 ENCOUNTER — Encounter: Payer: Self-pay | Admitting: Urology

## 2021-04-28 ENCOUNTER — Ambulatory Visit (INDEPENDENT_AMBULATORY_CARE_PROVIDER_SITE_OTHER): Payer: Medicare HMO | Admitting: Urology

## 2021-04-28 ENCOUNTER — Other Ambulatory Visit: Payer: Self-pay

## 2021-04-28 VITALS — BP 134/77 | HR 63 | Ht 67.0 in | Wt 142.0 lb

## 2021-04-28 DIAGNOSIS — R310 Gross hematuria: Secondary | ICD-10-CM | POA: Diagnosis not present

## 2021-04-28 DIAGNOSIS — N368 Other specified disorders of urethra: Secondary | ICD-10-CM

## 2021-04-28 LAB — MICROSCOPIC EXAMINATION
Bacteria, UA: NONE SEEN
Epithelial Cells (non renal): NONE SEEN /hpf (ref 0–10)

## 2021-04-28 LAB — URINALYSIS, COMPLETE
Bilirubin, UA: NEGATIVE
Glucose, UA: NEGATIVE
Ketones, UA: NEGATIVE
Leukocytes,UA: NEGATIVE
Nitrite, UA: NEGATIVE
Protein,UA: NEGATIVE
RBC, UA: NEGATIVE
Specific Gravity, UA: 1.015 (ref 1.005–1.030)
Urobilinogen, Ur: 0.2 mg/dL (ref 0.2–1.0)
pH, UA: 7 (ref 5.0–7.5)

## 2021-04-28 NOTE — Progress Notes (Signed)
   04/28/21  CC:  Chief Complaint  Patient presents with   Cysto    HPI: Seen 03/24/2021 for urethral spotting which has not recurred  NED. A&Ox3.   No respiratory distress   Abd soft, NT, ND Atrophic external genitalia with patent urethral meatus  Cystoscopy Procedure Note  Patient identification was confirmed, informed consent was obtained, and patient was prepped using Betadine solution.  Lidocaine jelly was administered per urethral meatus.    Procedure: - Flexible cystoscope introduced, without any difficulty.   - Thorough search of the bladder revealed:    normal urethral meatus; normal appearing urethra    normal urothelium    no stones    no ulcers     no tumors    no urethral polyps    no trabeculation  - Ureteral orifices were normal in position and appearance.  Post-Procedure: - Patient tolerated the procedure well  Assessment/ Plan: No abnormalities detected on cystoscopy or pelvic exam Urethral spotting has not recurred Follow-up prn   Abbie Sons, MD

## 2021-05-23 ENCOUNTER — Other Ambulatory Visit: Payer: Self-pay

## 2021-05-23 ENCOUNTER — Ambulatory Visit
Admission: RE | Admit: 2021-05-23 | Discharge: 2021-05-23 | Disposition: A | Discharge status: Home / Self Care | Payer: Medicare HMO | Source: Ambulatory Visit | Attending: Family Medicine | Admitting: Family Medicine

## 2021-05-23 DIAGNOSIS — Z1231 Encounter for screening mammogram for malignant neoplasm of breast: Secondary | ICD-10-CM | POA: Insufficient documentation

## 2021-06-16 ENCOUNTER — Ambulatory Visit (INDEPENDENT_AMBULATORY_CARE_PROVIDER_SITE_OTHER): Payer: Medicare HMO | Admitting: Family Medicine

## 2021-06-16 ENCOUNTER — Other Ambulatory Visit: Payer: Self-pay

## 2021-06-16 DIAGNOSIS — R310 Gross hematuria: Secondary | ICD-10-CM

## 2021-06-16 DIAGNOSIS — F3170 Bipolar disorder, currently in remission, most recent episode unspecified: Secondary | ICD-10-CM

## 2021-06-16 DIAGNOSIS — R059 Cough, unspecified: Secondary | ICD-10-CM | POA: Insufficient documentation

## 2021-06-16 DIAGNOSIS — I10 Essential (primary) hypertension: Secondary | ICD-10-CM | POA: Diagnosis not present

## 2021-06-16 DIAGNOSIS — E7800 Pure hypercholesterolemia, unspecified: Secondary | ICD-10-CM | POA: Diagnosis not present

## 2021-06-16 DIAGNOSIS — E039 Hypothyroidism, unspecified: Secondary | ICD-10-CM | POA: Diagnosis not present

## 2021-06-16 NOTE — Assessment & Plan Note (Signed)
Almost resolved.  This is been going on greater than a week.  Discussed possibility of allergies versus a viral illness including COVID-19.  At this point she will continue to wear a mask when she is around others.

## 2021-06-16 NOTE — Assessment & Plan Note (Signed)
Stable.  The patient asked if I could take over her prescription medications for this issue and I advised that they should come from a psychiatrist.  Discussed with the psychiatrist at [facility] psychiatry should be able to prescribe these medications for her.

## 2021-06-16 NOTE — Assessment & Plan Note (Signed)
She completed her evaluation with urology.  They advised follow-up as needed given her negative evaluation.

## 2021-06-16 NOTE — Assessment & Plan Note (Signed)
Adequate control.  She will continue amlodipine 5 mg once daily and propranolol 60 mg daily.

## 2021-06-16 NOTE — Patient Instructions (Signed)
Nice to see you. We will check labs next week. Please let her psychiatrist know that I will take over the Synthroid and the fish oil.  Your new psychiatrist should be able to manage the other medications.

## 2021-06-16 NOTE — Assessment & Plan Note (Signed)
Check TSH next week.  She will continue Synthroid 125 mcg daily.

## 2021-06-16 NOTE — Progress Notes (Signed)
Tommi Rumps, MD Phone: [phone]  Amy Mcconnell is a 70 y.o. female who presents today for f/u.  HYPERTENSION Disease Monitoring Home BP Monitoring 122/73 Chest pain- no    Dyspnea- no Medications Compliance-  taking amlodipine, propranolol.  Edema- no  HYPERLIPIDEMIA Symptoms Chest pain on exertion:  no   Medications: Compliance- taking lipitor Right upper quadrant pain- no  Muscle aches- no  HYPOTHYROIDISM Disease Monitoring Weight changes: no  Skin Changes: notes some hair loss Heat/Cold intolerance: no  Medication Monitoring Compliance:  taking synthroid   Last TSH:   Lab Results  Component Value Date   TSH 3.78 08/07/2019  Reports having this checked in June by psychiatry.  Bipolar disorder: Patient notes this is well controlled.  She has not had any medication changes recently.  No depression or manic episodes.  She is on Geodon, lithium, and Trileptal.  Her psychiatrist is retiring and referred her to a new psychiatrist.  Her psychiatrist has her on fish oil for what sounds to be tardive dyskinesia.  She reports the psychiatrist felt this was working well.  Cough: Patient had a cough starting over a week ago.  She felt chilled after being outside and then developed a mild cough.  She noted no fevers.  Note she has significantly improved at this point.  Social History   Tobacco Use  Smoking Status Former   Packs/day: 0.50   Years: 13.00   Pack years: 6.50   Types: Cigarettes   Quit date: 01/23/2000   Years since quitting: 21.4  Smokeless Tobacco Never    Current Outpatient Medications on File Prior to Visit  Medication Sig Dispense Refill   BIOTIN 5000 PO Take by mouth daily.     ACETAMINOPHEN PO Take 650 mg by mouth in the morning and at bedtime.      amLODipine (NORVASC) 5 MG tablet Take 1 tablet by mouth once daily 90 tablet 2   Ascorbic Acid (VITAMIN C) 1000 MG tablet Take 1,000 mg by mouth daily.     aspirin 81 MG tablet Take 81 mg by mouth  daily.     atorvastatin (LIPITOR) 80 MG tablet Take 1 tablet by mouth once daily 90 tablet 3   B Complex Vitamins (VITAMIN B COMPLEX PO) Take by mouth daily.     calcium carbonate (OS-CAL) 600 MG TABS tablet Take 600 mg by mouth daily.      Cholecalciferol (VITAMIN D3) 50 MCG (2000 UT) capsule Take 2,000 Units by mouth daily.     diphenhydrAMINE (BENADRYL) 25 mg capsule Take 25 mg by mouth daily as needed for allergies. For food and fragrance sensitivities     Docusate Sodium (RA COL-RITE PO) Take 1 capsule by mouth daily.     doxazosin (CARDURA) 8 MG tablet Take 1 tablet by mouth twice daily 180 tablet 3   EPINEPHrine 0.3 mg/0.3 mL IJ SOAJ injection Inject 0.3 mLs (0.3 mg total) into the muscle once. 2 Device 0   fexofenadine (ALLEGRA) 180 MG tablet Take 180 mg by mouth. 2-3 times daily prn For food and fragrance sensitivities/intolerances     Flaxseed, Linseed, (FLAXSEED OIL PO) Take by mouth daily.     levothyroxine (SYNTHROID, LEVOTHROID) 125 MCG tablet Take 125 mcg by mouth daily before breakfast.     lithium carbonate 150 MG capsule Take 150 mg by mouth 2 (two) times daily with a meal.     magnesium oxide (MAG-OX) 400 MG tablet Take 400 mg by mouth daily.  Multiple Vitamins-Minerals (MULTIVITAMIN WITH MINERALS) tablet Take 1 tablet by mouth daily.     Multiple Vitamins-Minerals (VISION FORMULA/LUTEIN) TABS Take by mouth in the morning and at bedtime.     Omega-3 Fatty Acids (FISH OIL) 1000 MG CAPS Take 3 capsules by mouth daily.      Oxcarbazepine (TRILEPTAL) 300 MG tablet Take 300 mg one tablet in the am & two tablets in the pm.     propranolol ER (INDERAL LA) 60 MG 24 hr capsule Take 1 capsule by mouth once daily 90 capsule 3   vitamin B-12 (CYANOCOBALAMIN) 1000 MCG tablet Take 1,000 mcg by mouth daily.     vitamin E 1000 UNIT capsule Take 1,000 Units by mouth daily.     zinc gluconate 50 MG tablet Take 50 mg by mouth daily.     ziprasidone (GEODON) 20 MG capsule Take 20 mg by mouth  daily.     No current facility-administered medications on file prior to visit.     ROS see history of present illness  Objective  Physical Exam Vitals:   06/16/21 0951  BP: 118/68  Pulse: 79  Resp: 16  Temp: 97.8 F (36.6 C)  SpO2: 98%    BP Readings from Last 3 Encounters:  06/16/21 118/68  04/28/21 134/77  03/24/21 120/87   Wt Readings from Last 3 Encounters:  06/16/21 139 lb 3.2 oz (63.1 kg)  04/28/21 142 lb (64.4 kg)  03/24/21 140 lb (63.5 kg)    Physical Exam Constitutional:      General: She is not in acute distress.    Appearance: She is not diaphoretic.  Cardiovascular:     Rate and Rhythm: Normal rate and regular rhythm.     Heart sounds: Normal heart sounds.  Pulmonary:     Effort: Pulmonary effort is normal.     Breath sounds: Normal breath sounds.  Musculoskeletal:     Right lower leg: No edema.     Left lower leg: No edema.  Skin:    General: Skin is warm and dry.  Neurological:     Mental Status: She is alert.     Assessment/Plan: Please see individual problem list.  Problem List Items Addressed This Visit     Bipolar disorder ([place])    Stable.  The patient asked if I could take over her prescription medications for this issue and I advised that they should come from a psychiatrist.  Discussed with the psychiatrist at [facility] psychiatry should be able to prescribe these medications for her.      Cough    Almost resolved.  This is been going on greater than a week.  Discussed possibility of allergies versus a viral illness including COVID-19.  At this point she will continue to wear a mask when she is around others.      Essential hypertension    Adequate control.  She will continue amlodipine 5 mg once daily and propranolol 60 mg daily.      Relevant Orders   Comp Met (CMET)   Hematuria    She completed her evaluation with urology.  They advised follow-up as needed given her negative evaluation.      Hyperlipidemia    Continue  Lipitor 80 mg once daily.  Check lipid panel with labs next week.      Relevant Orders   Comp Met (CMET)   Lipid panel   Hypothyroidism    Check TSH next week.  She will continue Synthroid 125 mcg daily.  Relevant Orders   TSH    Return in about 1 week (around 06/23/2021) for labs, 6 months PCP.  This visit occurred during the SARS-CoV-2 public health emergency.  Safety protocols were in place, including screening questions prior to the visit, additional usage of staff PPE, and extensive cleaning of exam room whi as she has not needed as ramipril.  Pain I have some, negative although I mean I have patients to do that to [facility] observing appropriate contact time as indicated for disinfecting solutions.    Tommi Rumps, MD [place]

## 2021-06-16 NOTE — Assessment & Plan Note (Signed)
Continue Lipitor 80 mg once daily.  Check lipid panel with labs next week.

## 2021-06-23 ENCOUNTER — Other Ambulatory Visit: Payer: Self-pay

## 2021-06-23 ENCOUNTER — Other Ambulatory Visit (INDEPENDENT_AMBULATORY_CARE_PROVIDER_SITE_OTHER): Payer: Medicare HMO

## 2021-06-23 DIAGNOSIS — E7800 Pure hypercholesterolemia, unspecified: Secondary | ICD-10-CM | POA: Diagnosis not present

## 2021-06-23 DIAGNOSIS — E039 Hypothyroidism, unspecified: Secondary | ICD-10-CM

## 2021-06-23 DIAGNOSIS — I10 Essential (primary) hypertension: Secondary | ICD-10-CM | POA: Diagnosis not present

## 2021-06-23 LAB — LIPID PANEL
Cholesterol: 140 mg/dL (ref 0–200)
HDL: 63.5 mg/dL (ref >39.00)
LDL Cholesterol: 58 mg/dL (ref 0–99)
NonHDL: 76.04
Total CHOL/HDL Ratio: 2
Triglycerides: 90 mg/dL (ref 0.0–149.0)
VLDL: 18 mg/dL (ref 0.0–40.0)

## 2021-06-23 LAB — COMPREHENSIVE METABOLIC PANEL
ALT: 36 U/L — ABNORMAL HIGH (ref 0–35)
AST: 29 U/L (ref 0–37)
Albumin: 4.3 g/dL (ref 3.5–5.2)
Alkaline Phosphatase: 77 U/L (ref 39–117)
BUN: 24 mg/dL — ABNORMAL HIGH (ref 6–23)
CO2: 26 mEq/L (ref 19–32)
Calcium: 9.6 mg/dL (ref 8.4–10.5)
Chloride: 106 mEq/L (ref 96–112)
Creatinine, Ser: 0.83 mg/dL (ref 0.40–1.20)
GFR: 71.31 mL/min (ref >60.00)
Glucose, Bld: 86 mg/dL (ref 70–99)
Potassium: 4.3 mEq/L (ref 3.5–5.1)
Sodium: 139 mEq/L (ref 135–145)
Total Bilirubin: 0.5 mg/dL (ref 0.2–1.2)
Total Protein: 6.9 g/dL (ref 6.0–8.3)

## 2021-06-23 LAB — TSH: TSH: 2.54 u[IU]/mL (ref 0.35–5.50)

## 2021-06-25 ENCOUNTER — Other Ambulatory Visit: Payer: Self-pay | Admitting: Family Medicine

## 2021-06-25 DIAGNOSIS — R7989 Other specified abnormal findings of blood chemistry: Secondary | ICD-10-CM

## 2021-06-25 DIAGNOSIS — R945 Abnormal results of liver function studies: Secondary | ICD-10-CM

## 2021-07-03 ENCOUNTER — Telehealth: Payer: Self-pay | Admitting: Family Medicine

## 2021-07-03 NOTE — Telephone Encounter (Signed)
Patient is calling in to check on a baseline thyroid test that she was to have.She stated that Dr.Sonnenberg said she can not take biotin until this test and she is having hair loss.Please advise.

## 2021-07-03 NOTE — Telephone Encounter (Signed)
Patient is calling in to check on a baseline thyroid test that she was to have.She stated that Dr.Sonnenberg said she can not take biotin until this test and she is having hair loss.Please advise.  Emy Angevine,cma

## 2021-07-04 NOTE — Telephone Encounter (Signed)
Her TSH was checked on 06/23/2021.  It was normal.  I would recommend that she stop any biotin supplements at least 1 week prior to future lab draws.

## 2021-07-05 NOTE — Telephone Encounter (Signed)
I called and spoke with the patient and informed her that her TSH was normal and she is to stop all Biotin 1 week prior to her lab draw and she understood.  Amy Mcconnell,cma

## 2021-07-07 DIAGNOSIS — F3111 Bipolar disorder, current episode manic without psychotic features, mild: Secondary | ICD-10-CM | POA: Diagnosis not present

## 2021-07-17 ENCOUNTER — Other Ambulatory Visit: Payer: Medicare HMO

## 2021-07-17 ENCOUNTER — Telehealth: Payer: Self-pay

## 2021-07-17 NOTE — Telephone Encounter (Signed)
Left a message to call back.

## 2021-07-17 NOTE — Telephone Encounter (Signed)
Called and spoke with Amy Mcconnell. Amy Mcconnell states that she is not taking the Biotin and has not taken it for the last 10 days. Pt verbalized understanding and had no further questions.

## 2021-07-17 NOTE — Telephone Encounter (Signed)
Patient returned office phone call. 

## 2021-07-17 NOTE — Telephone Encounter (Signed)
Pt called into access nurse stating that she has drunk lemonade before her liver enzyme labs this morning. Pt rescheduled to 07/19/21. Pt asks if she should take Biotin for her hair loss. Providing access nurse documentation

## 2021-07-17 NOTE — Telephone Encounter (Signed)
Noted.  I do not think the lemonade would have contributed to her elevated liver enzyme.  The biotin could mess with her thyroid labs and thus I would recommend avoiding it.

## 2021-07-19 ENCOUNTER — Other Ambulatory Visit: Payer: Self-pay

## 2021-07-19 ENCOUNTER — Other Ambulatory Visit (INDEPENDENT_AMBULATORY_CARE_PROVIDER_SITE_OTHER): Payer: Medicare HMO

## 2021-07-19 DIAGNOSIS — R945 Abnormal results of liver function studies: Secondary | ICD-10-CM | POA: Diagnosis not present

## 2021-07-19 DIAGNOSIS — R7989 Other specified abnormal findings of blood chemistry: Secondary | ICD-10-CM

## 2021-07-19 LAB — COMPREHENSIVE METABOLIC PANEL
ALT: 51 U/L — ABNORMAL HIGH (ref 0–35)
AST: 39 U/L — ABNORMAL HIGH (ref 0–37)
Albumin: 4.5 g/dL (ref 3.5–5.2)
Alkaline Phosphatase: 79 U/L (ref 39–117)
BUN: 21 mg/dL (ref 6–23)
CO2: 30 mEq/L (ref 19–32)
Calcium: 9.8 mg/dL (ref 8.4–10.5)
Chloride: 102 mEq/L (ref 96–112)
Creatinine, Ser: 0.79 mg/dL (ref 0.40–1.20)
GFR: 75.63 mL/min (ref >60.00)
Glucose, Bld: 85 mg/dL (ref 70–99)
Potassium: 4.2 mEq/L (ref 3.5–5.1)
Sodium: 137 mEq/L (ref 135–145)
Total Bilirubin: 0.5 mg/dL (ref 0.2–1.2)
Total Protein: 7.2 g/dL (ref 6.0–8.3)

## 2021-07-21 DIAGNOSIS — H524 Presbyopia: Secondary | ICD-10-CM | POA: Diagnosis not present

## 2021-07-24 ENCOUNTER — Other Ambulatory Visit: Payer: Self-pay | Admitting: Family Medicine

## 2021-07-24 ENCOUNTER — Telehealth: Payer: Self-pay | Admitting: Family Medicine

## 2021-07-24 DIAGNOSIS — E785 Hyperlipidemia, unspecified: Secondary | ICD-10-CM

## 2021-07-24 MED ORDER — ATORVASTATIN CALCIUM 40 MG PO TABS: 40.0000 mg | ORAL_TABLET | Freq: Every day | ORAL | 2 refills | Status: DC

## 2021-07-24 NOTE — Telephone Encounter (Signed)
Patient called back from last week, see lab notes. Per Dr Ellen Henri notes; Patient was scheduled for labs in 4 weeks. It looks like the new Liptor dosage has not been sent to pharmacy, at time of call.

## 2021-07-24 NOTE — Telephone Encounter (Signed)
I called [facility] about the Lipitor and they are working on the [employer] Patient was inforemd.   Kasia Trego,cma

## 2021-07-25 ENCOUNTER — Telehealth: Payer: Self-pay | Admitting: Family Medicine

## 2021-07-25 NOTE — Telephone Encounter (Signed)
Patient made eggs on the 16th that were set to expire 07/22/21. States she has been sick ever since. She fell and was too weak to get up on 07/24/21. Has taken 8 peptobismol pills.   Patient has had diarrhea and nausea.  Patient scheduled to see Dr Silvio Pate 07/26/21 at 8:30.

## 2021-07-26 ENCOUNTER — Emergency Department: Payer: Medicare HMO

## 2021-07-26 ENCOUNTER — Encounter: Payer: Self-pay | Admitting: Emergency Medicine

## 2021-07-26 ENCOUNTER — Other Ambulatory Visit: Payer: Self-pay

## 2021-07-26 ENCOUNTER — Observation Stay: Payer: Medicare HMO

## 2021-07-26 ENCOUNTER — Telehealth: Payer: Medicare HMO | Admitting: Internal Medicine

## 2021-07-26 ENCOUNTER — Inpatient Hospital Stay
Admission: EM | Admit: 2021-07-26 | Discharge: 2021-08-04 | DRG: 872 | Disposition: A | Discharge status: Home Health | Payer: Medicare HMO | Attending: Family Medicine | Admitting: Family Medicine

## 2021-07-26 DIAGNOSIS — E782 Mixed hyperlipidemia: Secondary | ICD-10-CM | POA: Diagnosis not present

## 2021-07-26 DIAGNOSIS — L237 Allergic contact dermatitis due to plants, except food: Secondary | ICD-10-CM | POA: Diagnosis present

## 2021-07-26 DIAGNOSIS — E785 Hyperlipidemia, unspecified: Secondary | ICD-10-CM | POA: Diagnosis present

## 2021-07-26 DIAGNOSIS — Z9102 Food additives allergy status: Secondary | ICD-10-CM

## 2021-07-26 DIAGNOSIS — E039 Hypothyroidism, unspecified: Secondary | ICD-10-CM | POA: Diagnosis present

## 2021-07-26 DIAGNOSIS — Z1612 Extended spectrum beta lactamase (ESBL) resistance: Secondary | ICD-10-CM | POA: Diagnosis present

## 2021-07-26 DIAGNOSIS — B9620 Unspecified Escherichia coli [E. coli] as the cause of diseases classified elsewhere: Secondary | ICD-10-CM | POA: Diagnosis not present

## 2021-07-26 DIAGNOSIS — I2510 Atherosclerotic heart disease of native coronary artery without angina pectoris: Secondary | ICD-10-CM | POA: Diagnosis present

## 2021-07-26 DIAGNOSIS — F319 Bipolar disorder, unspecified: Secondary | ICD-10-CM | POA: Diagnosis present

## 2021-07-26 DIAGNOSIS — R109 Unspecified abdominal pain: Secondary | ICD-10-CM | POA: Diagnosis not present

## 2021-07-26 DIAGNOSIS — A419 Sepsis, unspecified organism: Secondary | ICD-10-CM | POA: Diagnosis present

## 2021-07-26 DIAGNOSIS — R7401 Elevation of levels of liver transaminase levels: Secondary | ICD-10-CM | POA: Diagnosis present

## 2021-07-26 DIAGNOSIS — I479 Paroxysmal tachycardia, unspecified: Secondary | ICD-10-CM | POA: Diagnosis present

## 2021-07-26 DIAGNOSIS — A4151 Sepsis due to Escherichia coli [E. coli]: Secondary | ICD-10-CM | POA: Diagnosis not present

## 2021-07-26 DIAGNOSIS — T43595A Adverse effect of other antipsychotics and neuroleptics, initial encounter: Secondary | ICD-10-CM | POA: Diagnosis present

## 2021-07-26 DIAGNOSIS — R258 Other abnormal involuntary movements: Secondary | ICD-10-CM | POA: Diagnosis present

## 2021-07-26 DIAGNOSIS — R079 Chest pain, unspecified: Secondary | ICD-10-CM | POA: Diagnosis not present

## 2021-07-26 DIAGNOSIS — Z9079 Acquired absence of other genital organ(s): Secondary | ICD-10-CM

## 2021-07-26 DIAGNOSIS — D649 Anemia, unspecified: Secondary | ICD-10-CM | POA: Diagnosis not present

## 2021-07-26 DIAGNOSIS — S0101XA Laceration without foreign body of scalp, initial encounter: Secondary | ICD-10-CM | POA: Diagnosis present

## 2021-07-26 DIAGNOSIS — R29810 Facial weakness: Secondary | ICD-10-CM | POA: Diagnosis not present

## 2021-07-26 DIAGNOSIS — E871 Hypo-osmolality and hyponatremia: Secondary | ICD-10-CM | POA: Diagnosis present

## 2021-07-26 DIAGNOSIS — N1330 Unspecified hydronephrosis: Secondary | ICD-10-CM | POA: Diagnosis present

## 2021-07-26 DIAGNOSIS — W010XXA Fall on same level from slipping, tripping and stumbling without subsequent striking against object, initial encounter: Secondary | ICD-10-CM | POA: Diagnosis present

## 2021-07-26 DIAGNOSIS — Z87891 Personal history of nicotine dependence: Secondary | ICD-10-CM | POA: Diagnosis not present

## 2021-07-26 DIAGNOSIS — B9629 Other Escherichia coli [E. coli] as the cause of diseases classified elsewhere: Secondary | ICD-10-CM | POA: Diagnosis not present

## 2021-07-26 DIAGNOSIS — R531 Weakness: Secondary | ICD-10-CM | POA: Diagnosis not present

## 2021-07-26 DIAGNOSIS — Z79890 Hormone replacement therapy (postmenopausal): Secondary | ICD-10-CM

## 2021-07-26 DIAGNOSIS — I10 Essential (primary) hypertension: Secondary | ICD-10-CM | POA: Diagnosis present

## 2021-07-26 DIAGNOSIS — Z8249 Family history of ischemic heart disease and other diseases of the circulatory system: Secondary | ICD-10-CM

## 2021-07-26 DIAGNOSIS — K2100 Gastro-esophageal reflux disease with esophagitis, without bleeding: Secondary | ICD-10-CM | POA: Diagnosis present

## 2021-07-26 DIAGNOSIS — D696 Thrombocytopenia, unspecified: Secondary | ICD-10-CM | POA: Diagnosis present

## 2021-07-26 DIAGNOSIS — R471 Dysarthria and anarthria: Secondary | ICD-10-CM | POA: Diagnosis present

## 2021-07-26 DIAGNOSIS — N390 Urinary tract infection, site not specified: Secondary | ICD-10-CM | POA: Diagnosis present

## 2021-07-26 DIAGNOSIS — R160 Hepatomegaly, not elsewhere classified: Secondary | ICD-10-CM | POA: Diagnosis not present

## 2021-07-26 DIAGNOSIS — F3170 Bipolar disorder, currently in remission, most recent episode unspecified: Secondary | ICD-10-CM

## 2021-07-26 DIAGNOSIS — H532 Diplopia: Secondary | ICD-10-CM | POA: Diagnosis present

## 2021-07-26 DIAGNOSIS — R6520 Severe sepsis without septic shock: Secondary | ICD-10-CM | POA: Diagnosis not present

## 2021-07-26 DIAGNOSIS — Z818 Family history of other mental and behavioral disorders: Secondary | ICD-10-CM

## 2021-07-26 DIAGNOSIS — Z20822 Contact with and (suspected) exposure to covid-19: Secondary | ICD-10-CM | POA: Diagnosis not present

## 2021-07-26 DIAGNOSIS — K828 Other specified diseases of gallbladder: Secondary | ICD-10-CM | POA: Diagnosis not present

## 2021-07-26 DIAGNOSIS — F259 Schizoaffective disorder, unspecified: Secondary | ICD-10-CM | POA: Diagnosis not present

## 2021-07-26 DIAGNOSIS — F419 Anxiety disorder, unspecified: Secondary | ICD-10-CM | POA: Diagnosis not present

## 2021-07-26 DIAGNOSIS — A498 Other bacterial infections of unspecified site: Secondary | ICD-10-CM | POA: Diagnosis not present

## 2021-07-26 DIAGNOSIS — Z7982 Long term (current) use of aspirin: Secondary | ICD-10-CM

## 2021-07-26 DIAGNOSIS — N281 Cyst of kidney, acquired: Secondary | ICD-10-CM | POA: Diagnosis not present

## 2021-07-26 DIAGNOSIS — Z888 Allergy status to other drugs, medicaments and biological substances status: Secondary | ICD-10-CM

## 2021-07-26 DIAGNOSIS — S0003XA Contusion of scalp, initial encounter: Secondary | ICD-10-CM | POA: Diagnosis not present

## 2021-07-26 DIAGNOSIS — N179 Acute kidney failure, unspecified: Secondary | ICD-10-CM | POA: Diagnosis present

## 2021-07-26 DIAGNOSIS — Z79899 Other long term (current) drug therapy: Secondary | ICD-10-CM

## 2021-07-26 DIAGNOSIS — E869 Volume depletion, unspecified: Secondary | ICD-10-CM | POA: Diagnosis present

## 2021-07-26 DIAGNOSIS — R7881 Bacteremia: Secondary | ICD-10-CM | POA: Diagnosis not present

## 2021-07-26 DIAGNOSIS — N3000 Acute cystitis without hematuria: Secondary | ICD-10-CM

## 2021-07-26 LAB — DIFFERENTIAL
Abs Immature Granulocytes: 0.23 10*3/uL — ABNORMAL HIGH (ref 0.00–0.07)
Basophils Absolute: 0 10*3/uL (ref 0.0–0.1)
Basophils Relative: 0 %
Eosinophils Absolute: 0.1 10*3/uL (ref 0.0–0.5)
Eosinophils Relative: 0 %
Immature Granulocytes: 1 %
Lymphocytes Relative: 3 %
Lymphs Abs: 0.6 10*3/uL — ABNORMAL LOW (ref 0.7–4.0)
Monocytes Absolute: 2.1 10*3/uL — ABNORMAL HIGH (ref 0.1–1.0)
Monocytes Relative: 11 %
Neutro Abs: 16.9 10*3/uL — ABNORMAL HIGH (ref 1.7–7.7)
Neutrophils Relative %: 85 %

## 2021-07-26 LAB — PROTIME-INR
INR: 1.2 (ref 0.8–1.2)
Prothrombin Time: 15.5 seconds — ABNORMAL HIGH (ref 11.4–15.2)

## 2021-07-26 LAB — COMPREHENSIVE METABOLIC PANEL
ALT: 172 U/L — ABNORMAL HIGH (ref 0–44)
AST: 128 U/L — ABNORMAL HIGH (ref 15–41)
Albumin: 3.4 g/dL — ABNORMAL LOW (ref 3.5–5.0)
Alkaline Phosphatase: 140 U/L — ABNORMAL HIGH (ref 38–126)
Anion gap: 11 (ref 5–15)
BUN: 34 mg/dL — ABNORMAL HIGH (ref 8–23)
CO2: 21 mmol/L — ABNORMAL LOW (ref 22–32)
Calcium: 9.4 mg/dL (ref 8.9–10.3)
Chloride: 94 mmol/L — ABNORMAL LOW (ref 98–111)
Creatinine, Ser: 1.13 mg/dL — ABNORMAL HIGH (ref 0.44–1.00)
GFR, Estimated: 52 mL/min — ABNORMAL LOW (ref >60)
Glucose, Bld: 132 mg/dL — ABNORMAL HIGH (ref 70–99)
Potassium: 5 mmol/L (ref 3.5–5.1)
Sodium: 126 mmol/L — ABNORMAL LOW (ref 135–145)
Total Bilirubin: 0.8 mg/dL (ref 0.3–1.2)
Total Protein: 7.3 g/dL (ref 6.5–8.1)

## 2021-07-26 LAB — URINALYSIS, COMPLETE (UACMP) WITH MICROSCOPIC
Bilirubin Urine: NEGATIVE
Glucose, UA: NEGATIVE mg/dL
Ketones, ur: NEGATIVE mg/dL
Nitrite: NEGATIVE
Protein, ur: 30 mg/dL — AB
Specific Gravity, Urine: 1.006 (ref 1.005–1.030)
Squamous Epithelial / HPF: NONE SEEN (ref 0–5)
pH: 6 (ref 5.0–8.0)

## 2021-07-26 LAB — CBC
HCT: 35.3 % — ABNORMAL LOW (ref 36.0–46.0)
Hemoglobin: 11.5 g/dL — ABNORMAL LOW (ref 12.0–15.0)
MCH: 28.3 pg (ref 26.0–34.0)
MCHC: 32.6 g/dL (ref 30.0–36.0)
MCV: 86.7 fL (ref 80.0–100.0)
Platelets: 98 10*3/uL — ABNORMAL LOW (ref 150–400)
RBC: 4.07 MIL/uL (ref 3.87–5.11)
RDW: 14.6 % (ref 11.5–15.5)
WBC: 19.9 10*3/uL — ABNORMAL HIGH (ref 4.0–10.5)
nRBC: 0 % (ref 0.0–0.2)

## 2021-07-26 LAB — LACTIC ACID, PLASMA: Lactic Acid, Venous: 0.9 mmol/L (ref 0.5–1.9)

## 2021-07-26 LAB — LIPASE, BLOOD: Lipase: 47 U/L (ref 11–51)

## 2021-07-26 LAB — PROCALCITONIN: Procalcitonin: 6.24 ng/mL

## 2021-07-26 LAB — RESP PANEL BY RT-PCR (FLU A&B, COVID) ARPGX2
Influenza A by PCR: NEGATIVE
Influenza B by PCR: NEGATIVE
SARS Coronavirus 2 by RT PCR: NEGATIVE

## 2021-07-26 LAB — APTT: aPTT: 34 seconds (ref 24–36)

## 2021-07-26 LAB — TROPONIN I (HIGH SENSITIVITY): Troponin I (High Sensitivity): 41 ng/L — ABNORMAL HIGH (ref <18)

## 2021-07-26 MED ORDER — SODIUM CHLORIDE 0.9 % IV SOLN: 2.0000 g | INTRAVENOUS | Status: DC

## 2021-07-26 MED ORDER — SODIUM CHLORIDE 0.9 % IV SOLN
1.0000 g | Freq: Once | INTRAVENOUS | Status: AC
  Administered 2021-07-26: 1 g via INTRAVENOUS

## 2021-07-26 MED ORDER — ATORVASTATIN CALCIUM 20 MG PO TABS
40.0000 mg | ORAL_TABLET | Freq: Every day | ORAL | Status: DC
  Administered 2021-07-27 – 2021-07-28 (×2): 40 mg via ORAL
  Filled 2021-07-26 (×2): qty 2

## 2021-07-26 MED ORDER — ZIPRASIDONE HCL 40 MG PO CAPS
40.0000 mg | ORAL_CAPSULE | Freq: Every day | ORAL | Status: DC
  Administered 2021-07-27 – 2021-08-03 (×8): 40 mg via ORAL
  Filled 2021-07-26 (×10): qty 1

## 2021-07-26 MED ORDER — OXCARBAZEPINE 300 MG PO TABS
600.0000 mg | ORAL_TABLET | Freq: Every day | ORAL | Status: DC
  Administered 2021-07-27 – 2021-07-30 (×4): 600 mg via ORAL
  Filled 2021-07-26 (×5): qty 2

## 2021-07-26 MED ORDER — KETOROLAC TROMETHAMINE 15 MG/ML IJ SOLN
15.0000 mg | Freq: Four times a day (QID) | INTRAMUSCULAR | Status: AC | PRN
  Filled 2021-07-26: qty 1

## 2021-07-26 MED ORDER — OXCARBAZEPINE 300 MG PO TABS: 300.0000 mg | ORAL_TABLET | ORAL | Status: DC

## 2021-07-26 MED ORDER — DOXAZOSIN MESYLATE 8 MG PO TABS
8.0000 mg | ORAL_TABLET | Freq: Two times a day (BID) | ORAL | Status: DC
  Filled 2021-07-26 (×2): qty 1

## 2021-07-26 MED ORDER — LEVOTHYROXINE SODIUM 50 MCG PO TABS
125.0000 ug | ORAL_TABLET | Freq: Every day | ORAL | Status: DC
  Administered 2021-07-27 – 2021-08-04 (×9): 125 ug via ORAL
  Filled 2021-07-26 (×9): qty 1

## 2021-07-26 MED ORDER — VITAMIN B-12 1000 MCG PO TABS
1000.0000 ug | ORAL_TABLET | Freq: Every day | ORAL | Status: DC
  Administered 2021-07-27 – 2021-08-04 (×9): 1000 ug via ORAL
  Filled 2021-07-26 (×10): qty 1

## 2021-07-26 MED ORDER — OMEGA-3-ACID ETHYL ESTERS 1 G PO CAPS
3000.0000 mg | ORAL_CAPSULE | Freq: Every day | ORAL | Status: DC
  Administered 2021-07-27 – 2021-08-04 (×9): 3000 mg via ORAL
  Filled 2021-07-26 (×9): qty 3

## 2021-07-26 MED ORDER — OXCARBAZEPINE 300 MG PO TABS
300.0000 mg | ORAL_TABLET | Freq: Every day | ORAL | Status: DC
  Administered 2021-07-27 – 2021-07-31 (×5): 300 mg via ORAL
  Filled 2021-07-26 (×5): qty 1

## 2021-07-26 MED ORDER — SODIUM CHLORIDE 0.9 % IV SOLN: INTRAVENOUS | Status: DC

## 2021-07-26 MED ORDER — ACETAMINOPHEN 325 MG PO TABS: 650.0000 mg | ORAL_TABLET | Freq: Four times a day (QID) | ORAL | Status: DC | PRN

## 2021-07-26 MED ORDER — SODIUM CHLORIDE 0.9 % IV BOLUS
1000.0000 mL | Freq: Once | INTRAVENOUS | Status: AC
  Administered 2021-07-26: 1000 mL via INTRAVENOUS

## 2021-07-26 MED ORDER — DOCUSATE SODIUM 100 MG PO CAPS
100.0000 mg | ORAL_CAPSULE | Freq: Every day | ORAL | Status: DC
  Administered 2021-07-27 – 2021-08-04 (×8): 100 mg via ORAL
  Filled 2021-07-26 (×9): qty 1

## 2021-07-26 MED ORDER — IOHEXOL 350 MG/ML SOLN
75.0000 mL | Freq: Once | INTRAVENOUS | Status: AC | PRN
  Administered 2021-07-26: 75 mL via INTRAVENOUS

## 2021-07-26 MED ORDER — ASPIRIN EC 81 MG PO TBEC
81.0000 mg | DELAYED_RELEASE_TABLET | Freq: Every day | ORAL | Status: DC
  Administered 2021-07-27 – 2021-08-04 (×9): 81 mg via ORAL
  Filled 2021-07-26 (×9): qty 1

## 2021-07-26 MED ORDER — MORPHINE SULFATE (PF) 2 MG/ML IV SOLN
1.0000 mg | INTRAVENOUS | Status: AC | PRN
  Administered 2021-07-26 – 2021-07-28 (×2): 1 mg via INTRAVENOUS
  Filled 2021-07-26 (×2): qty 1

## 2021-07-26 MED ORDER — ASCORBIC ACID 500 MG PO TABS
1000.0000 mg | ORAL_TABLET | Freq: Every day | ORAL | Status: DC
  Administered 2021-07-27 – 2021-08-04 (×9): 1000 mg via ORAL
  Filled 2021-07-26 (×9): qty 2

## 2021-07-26 MED ORDER — ENOXAPARIN SODIUM 40 MG/0.4ML IJ SOSY: 40.0000 mg | PREFILLED_SYRINGE | INTRAMUSCULAR | Status: DC

## 2021-07-26 MED ORDER — PROPRANOLOL HCL ER 60 MG PO CP24: 60.0000 mg | ORAL_CAPSULE | Freq: Every day | ORAL | Status: DC

## 2021-07-26 MED ORDER — VITAMIN E 45 MG (100 UNIT) PO CAPS
1000.0000 [IU] | ORAL_CAPSULE | Freq: Every day | ORAL | Status: DC
  Administered 2021-07-28 – 2021-08-01 (×5): 1000 [IU] via ORAL
  Filled 2021-07-26 (×9): qty 10

## 2021-07-26 MED ORDER — ACETAMINOPHEN 650 MG RE SUPP: 650.0000 mg | Freq: Four times a day (QID) | RECTAL | Status: DC | PRN

## 2021-07-26 MED ORDER — ONDANSETRON HCL 4 MG PO TABS: 4.0000 mg | ORAL_TABLET | Freq: Four times a day (QID) | ORAL | Status: DC | PRN

## 2021-07-26 MED ORDER — DOXAZOSIN MESYLATE 4 MG PO TABS
8.0000 mg | ORAL_TABLET | Freq: Two times a day (BID) | ORAL | Status: DC
  Administered 2021-07-27 – 2021-08-04 (×18): 8 mg via ORAL
  Filled 2021-07-26 (×19): qty 2

## 2021-07-26 MED ORDER — LITHIUM CARBONATE ER 300 MG PO TBCR
600.0000 mg | EXTENDED_RELEASE_TABLET | Freq: Every day | ORAL | Status: DC
  Administered 2021-07-27 – 2021-08-03 (×9): 600 mg via ORAL
  Filled 2021-07-26 (×10): qty 2

## 2021-07-26 MED ORDER — ONDANSETRON HCL 4 MG/2ML IJ SOLN: 4.0000 mg | Freq: Four times a day (QID) | INTRAMUSCULAR | Status: DC | PRN

## 2021-07-26 NOTE — ED Provider Notes (Signed)
Emergency Medicine Provider Triage Evaluation Note  Amy Mcconnell , a 70 y.o. female  was evaluated in triage.  Pt complains of nausea, vomiting, and concern for CVA. Brother states she fell a few days ago while out in her yard and drug herself across the concrete causing abrasions to her knees. Facial droop started a few days ago, but she didn't come to the hospital. She is mainly concerned about the nausea and vomiting.  Review of Systems  Positive: Nausea, vomiting, diarrhea, facial droop Negative: Chest pain, shortness of breath  Physical Exam  There were no vitals taken for this visit. Gen:   Awake, no distress   Resp:  Normal effort  MSK:   Moves extremities without difficulty  Other:    Medical Decision Making  Medically screening exam initiated at 1:25 PM.  Appropriate orders placed.  Amy Mcconnell was informed that the remainder of the evaluation will be completed by another provider, this initial triage assessment does not replace that evaluation, and the importance of remaining in the ED until their evaluation is complete.   Victorino Dike, FNP 07/26/21 1406    Amy Starch, MD 07/26/21 1410

## 2021-07-26 NOTE — H&P (Addendum)
History and Physical   Amy Mcconnell:[id number] DOB: [birth year] DOA: 07/26/2021  PCP: Leone Haven, MD  Outpatient Specialists: Dr. Bernardo Heater; Dr. Trevor Mace, psychiatrist Patient coming from: Home via private vehicle  I have personally briefly reviewed patient's old medical records in [place].  Chief Concern: Nausea and vomiting  HPI: Amy Mcconnell is a 70 y.o. female with medical history significant for hyperlipidemia, hypothyroid, history of bipolar, history of schizophrenia, anxiety/depression, hyperlipidemia, hypertension, history of smoking, presents emergency department for chief concerns of nausea and vomiting.  She reports the generalized weakness that started on 9/17. She reports that for the last four days and worsening toay. She endorses poor PO intake. She reports that today, she was sitting on the floor and putting on her shoes and she was not able to get up. She endorses dysuria, polyuria, and urgency this week.  She further endorses that she had generalized abdominal pain specifically right-sided a few days ago but that has resolved.  At bedside she endorses myalgia and chills.  She states she did not present earlier because she assumed this was Salmonella or gastroenteritis.  Of note, patient has never been diagnosed with Salmonella infection in the past.  Sister at bedside is endorsing groaning as well. She endorses double vision that started today.   She endorses subjective fever at home and not able to check because thermometer was broken. She endorses associated nauses, diarrhea, pale yellow.   She endorses taking OTC biotin, about one month ago. She reports the last time she had diarrhea was two days.   She endorses bilateral lower extremity muscle aches.   ROS: Constitutional: no weight change, + fever ENT/Mouth: no sore throat, no rhinorrhea Eyes: no eye pain, no vision changes Cardiovascular: no chest pain, no dyspnea,  no edema, no  palpitations Respiratory: no cough, no sputum, no wheezing Gastrointestinal: + nausea, no vomiting, no diarrhea, no constipation Genitourinary: no urinary incontinence, + dysuria, no hematuria Musculoskeletal: no arthralgias, no myalgias Skin: no skin lesions, no pruritus, Neuro: + weakness, no loss of consciousness, no syncope Psych: no anxiety, no depression, + decrease appetite Heme/Lymph: no bruising, no bleeding  ED Course: Discussed with emergency medicine provider, patient requiring hospitalization for possible sepsis.  Vitals in the emergency department was remarkable for T-max of 100.4, improved to 98.1, respiration rate of 19, heart rate of 84, initial blood pressure 105/57 and improved to 108/60, SPO2 of 94% on room air.  Labs in the emergency department was remarkable for serum sodium 126, potassium 5.0, chloride 94, bicarb 21, BUN of 34, serum creatinine of 1.13, nonfasting blood glucose 132, WBC 19.9, hemoglobin 11.5, platelets 98, EGFR 52.  UA was positive for leukocytes.  COVID PCR is pending.  In the emergency department patient received sodium chloride 1 L bolus, started on ceftriaxone 1 g IV.  Assessment/Plan  Principal Problem:   UTI (urinary tract infection) Active Problems:   Smoking history   Hyperlipidemia   Essential hypertension   Anxiety   Bipolar disorder (HCC)   Hypothyroidism   Scalp laceration   # Meet sepsis criteria in the strictest sense of the definition with T-max of 100.4, leukocytosis of 19.9, and a source of urine with elevated leukocytes - Lactic acid x2 ordered - Check procalcitonin, Legionella antigen in the urine - Blood cultures x2 are in process - Ordered urine culture study, in process - Status post ceftriaxone 1 g IV, I will continue with ceftriaxone at 2 g IV  #  Hyponatremia-check Legionella antigen and lithium level  # Hyperlipidemia-atorvastatin 40 mg nightly  # Hypothyroid-levothyroxine 125 mcg daily before breakfast  resumed  # History of facial droop-query side effects from bipolar medications -MRI of the brain without contrast ordered  # Paroxysmal tachycardia-continue propanolol 60 mg daily, doxazosin 8 mg p.o. twice daily  # History of bipolar disorder-resumed home medications including  oxcarbazepine, Zyprexa down 40 mg nightly, lithium 600 mg nightly  # Double vision-MRI of the brain without contrast ordered to assess for MS  Chart reviewed.   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 Thurmond Butts - every 3 months   Broken foot    left    Contact dermatitis and other eczema due to other specified agent    Contusion of unspecified part of lower limb    Essential hypertension, benign    Heart murmur    Other and unspecified hyperlipidemia    Other specified disorders of thyroid    Reflux esophagitis    Tachycardia    a. isolated episode, seen in ED with negative work up   Past Surgical History:  Procedure Laterality Date   CATARACT EXTRACTION     SALPINGECTOMY Left    STERILIZATION     Her decision since dz of bipolar   Social History:  reports that she quit smoking about 21 years ago. She has a 6.50 pack-year smoking history. She has never used smokeless tobacco. She reports that she does not drink alcohol and does not use drugs.  Allergies  Allergen Reactions   Ace Inhibitors     Angioedema    Angiotensin Receptor Blockers     Angioedema    Benicar [Olmesartan]     Mouth & Lip Swelling, "I swell from the waist down". Throat swelling   Poison Ivy Extract [Poison Ivy Extract]     Blisters    Purell Instant Hand [Alcohol] Swelling   Triclosan    Family History  Problem Relation Age of Onset   Arrhythmia Mother        A-Fib   Heart failure Mother    Hyperlipidemia Mother    Hypertension Mother    Alzheimer's disease Mother    Hypertension Father    Hyperlipidemia Father    Mental retardation Father 43       Suicide   Hypertension Brother    Hyperlipidemia Brother    Hypertension Brother    Hyperlipidemia Brother    Hyperlipidemia Brother    Hypertension Brother    Alcohol abuse Brother    Depression Brother    Hyperlipidemia Brother    Hypertension Brother    Breast cancer Maternal Aunt    Family history: Family history reviewed and not pertinent  Prior to Admission medications   Medication Sig Start Date End Date Taking? Authorizing Provider  ACETAMINOPHEN PO Take 650 mg by mouth in the morning and at bedtime.     [provider]  amLODipine (NORVASC) 5 MG tablet Take 1 tablet by mouth once daily 01/23/21   Minna Merritts, MD  Ascorbic Acid (VITAMIN C) 1000 MG tablet Take 1,000 mg by mouth daily.  [provider]  aspirin 81 MG tablet Take 81 mg by mouth daily.    [provider]  atorvastatin (LIPITOR) 40 MG tablet Take 1 tablet (40 mg total) by mouth daily. 07/24/21   Leone Haven, MD  B Complex Vitamins (VITAMIN B COMPLEX PO) Take by mouth daily.    [provider]  BIOTIN 5000 PO Take by mouth daily.    [provider]  calcium carbonate (OS-CAL) 600 MG TABS tablet Take 600 mg by mouth daily.     [provider]  Cholecalciferol (VITAMIN D3) 50 MCG (2000 UT) capsule Take 2,000 Units by mouth daily.    [provider]  diphenhydrAMINE (BENADRYL) 25 mg capsule Take 25 mg by mouth daily as needed for allergies. For food and fragrance sensitivities    [provider]  Docusate Sodium (RA COL-RITE PO) Take 1 capsule by mouth daily.    [provider]  doxazosin (CARDURA) 8  MG tablet Take 1 tablet by mouth twice daily 02/13/21   Minna Merritts, MD  EPINEPHrine 0.3 mg/0.3 mL IJ SOAJ injection Inject 0.3 mLs (0.3 mg total) into the muscle once. 03/30/16   Leone Haven, MD  fexofenadine (ALLEGRA) 180 MG tablet Take 180 mg by mouth. 2-3 times daily prn For food and fragrance sensitivities/intolerances    [provider]  Flaxseed, Linseed, (FLAXSEED OIL PO) Take by mouth daily.    [provider]  levothyroxine (SYNTHROID, LEVOTHROID) 125 MCG tablet Take 125 mcg by mouth daily before breakfast.    [provider]  lithium carbonate 150 MG capsule Take 150 mg by mouth 2 (two) times daily with a meal.    [provider]  magnesium oxide (MAG-OX) 400 MG tablet Take 400 mg by mouth daily.    [provider]  Multiple Vitamins-Minerals (MULTIVITAMIN WITH MINERALS) tablet Take 1 tablet by mouth daily.    [provider]  Multiple Vitamins-Minerals (VISION FORMULA/LUTEIN) TABS Take by mouth in the morning and at bedtime.    [provider]  Omega-3 Fatty Acids (FISH OIL) 1000 MG CAPS Take 3 capsules by mouth daily.     [provider]  Oxcarbazepine (TRILEPTAL) 300 MG tablet Take 300 mg one tablet in the am & two tablets in the pm.    [provider]  propranolol ER (INDERAL LA) 60 MG 24 hr capsule Take 1 capsule by mouth once daily 12/21/20   Minna Merritts, MD  vitamin B-12 (CYANOCOBALAMIN) 1000 MCG tablet Take 1,000 mcg by mouth daily.    [provider]  vitamin E 1000 UNIT capsule Take 1,000 Units by mouth daily.    [provider]  zinc gluconate 50 MG tablet Take 50 mg by mouth daily.    [provider]  ziprasidone (GEODON) 20 MG capsule Take 20 mg by mouth daily.    [provider]   Physical Exam: Vitals:   07/26/21 1328 07/26/21 1330 07/26/21 1650 07/26/21 2128  BP:  (!) 105/57 108/60 110/65  Pulse:  84 84 85  Resp:  _0 Temp:  (!)  100.4 F (38 C) 98.1 F (36.7 C) 98 F (36.7 C)  TempSrc:  Oral Oral Oral  SpO2:  95% 98% 98%  Weight: 63.1 kg     Height: _1  (1.702 m)      Constitutional: appears age-appropriate, NAD, calm, comfortable Eyes: PERRL, lids and conjunctivae normal ENMT: Mucous membranes are moist. Posterior pharynx clear of  any exudate or lesions. Age-appropriate dentition. Hearing appropriate Neck: normal, supple, no masses, no thyromegaly Respiratory: clear to auscultation bilaterally, no wheezing, no crackles. Normal respiratory effort. No accessory muscle use.  Cardiovascular: Regular rate and rhythm, no murmurs / rubs / gallops. No extremity edema. 2+ pedal pulses. No carotid bruits.  Abdomen: no tenderness, no masses palpated, no hepatosplenomegaly. Bowel sounds positive.  Musculoskeletal: no clubbing / cyanosis. No joint deformity upper and lower extremities. Good ROM, no contractures, no atrophy. Normal muscle tone.  Skin: no rashes, lesions, ulcers. No induration Neurologic: Sensation intact. Strength 5/5 in all 4.  Psychiatric: Normal judgment and insight. Alert and oriented x 3. Normal mood.   EKG: independently reviewed, showing sinus rhythm with rate of 82, QTc 411  Chest x-ray on Admission: I personally reviewed and I agree with radiologist reading as below.  CT HEAD WO CONTRAST  Result Date: 07/26/2021 CLINICAL DATA:  Neuro deficit, acute stroke suspected. Nausea, vomiting and facial droop for a while. EXAM: CT HEAD WITHOUT CONTRAST TECHNIQUE: Contiguous axial images were obtained from the base of the skull through the vertex without intravenous contrast. COMPARISON:  CT head 04/14/2019. FINDINGS: Brain: There is no evidence of acute intracranial hemorrhage, mass lesion, brain edema or extra-axial fluid collection. The ventricles and subarachnoid spaces are appropriately sized for age. There is no CT evidence of acute cortical infarction. Vascular:  No hyperdense vessel identified. Skull:  Negative for fracture or focal lesion. Sinuses/Orbits: The visualized paranasal sinuses and mastoid air cells are clear. No orbital abnormalities are seen. Other: The previously demonstrated large left scalp hematoma has resolved. IMPRESSION: No acute intracranial or calvarial findings. No CT evidence of acute stroke. Resolution of previously demonstrated large left scalp hematoma. Electronically Signed   By: Richardean Sale M.D.   On: 07/26/2021 14:49   MR BRAIN WO CONTRAST  Result Date: 07/26/2021 CLINICAL DATA:  Neuro deficit, acute, stroke suspected. Generalized weakness with recent fall. EXAM: MRI HEAD WITHOUT CONTRAST TECHNIQUE: Multiplanar, multiecho pulse sequences of the brain and surrounding structures were obtained without intravenous contrast. COMPARISON:  Head CT same day FINDINGS: Brain: Diffusion imaging does not show any acute or subacute infarction. The brainstem and cerebellum are normal. Cerebral hemispheres show minimal small vessel change of the white matter, less than often seen at this age. No cortical or large vessel territory infarction. No mass lesion, hemorrhage, hydrocephalus or extra-axial collection. Vascular: Major vessels at the base of the brain show flow. Skull and upper cervical spine: Negative Sinuses/Orbits: Clear/normal Other: None IMPRESSION: No acute finding. Mild chronic small-vessel ischemic change of the cerebral hemispheric white matter, less than often seen at this age. Electronically Signed   By: Nelson Chimes M.D.   On: 07/26/2021 20:22   CT ABDOMEN PELVIS W CONTRAST  Result Date: 07/26/2021 CLINICAL DATA:  Abdominal pain and fever. EXAM: CT ABDOMEN AND PELVIS WITH CONTRAST TECHNIQUE: Multidetector CT imaging of the abdomen and pelvis was performed using the standard protocol following bolus administration of intravenous contrast. CONTRAST:  28m OMNIPAQUE IOHEXOL 350 MG/ML SOLN COMPARISON:  None. FINDINGS: Lower chest: Streaky basilar scarring changes or  atelectasis. No pleural effusions or pulmonary lesions. The heart is normal in size. Aortic and coronary artery calcifications are noted. Hepatobiliary: No hepatic lesions or intrahepatic biliary dilatation. The gallbladder is unremarkable. No common bile duct dilatation. Pancreas: No mass, inflammation or ductal dilatation. Spleen: Normal size.  No focal lesions. Adrenals/Urinary Tract: Small bilateral adrenal gland nodules likely small adenomas. Innumerable small renal cysts are  noted bilaterally. No worrisome renal lesions. There is mild left-sided hydronephrosis and hydroureter but no obstructing ureteral calculi are identified. Possible recently passed calculus. Bladder is unremarkable. Stomach/Bowel: The stomach, duodenum, small bowel and colon are grossly normal. Vascular/Lymphatic: Moderate atherosclerotic calcifications involving the aorta and iliac arteries and branch vessel ostia. No aneurysm or dissection. The major venous structures are patent. Small scattered mesenteric and retroperitoneal lymph nodes but no mass or overt adenopathy. Reproductive: The uterus and ovaries are unremarkable. Other: No pelvic mass or adenopathy. No free pelvic fluid collections. No inguinal mass or adenopathy. No abdominal wall hernia or subcutaneous lesions. Musculoskeletal: No significant bony findings. IMPRESSION: 1. No acute abdominal/pelvic findings, mass lesions or adenopathy. 2. Innumerable small renal cysts. 3. Small bilateral adrenal gland nodules likely small adenomas. 4. Mild left-sided hydroureteronephrosis without obstructing ureteral calculus. Possible recently passed calculus. 5. Moderate atherosclerotic calcifications involving the aorta and iliac arteries. Aortic Atherosclerosis (ICD10-I70.0). Electronically Signed   By: Marijo Sanes M.D.   On: 07/26/2021 17:37   DG Chest Portable 1 View  Result Date: 07/26/2021 CLINICAL DATA:  Chest pain and facial droop EXAM: PORTABLE CHEST 1 VIEW COMPARISON:   04/09/2019 FINDINGS: Cardiac shadow is within normal limits. Lungs are well aerated bilaterally. No focal infiltrate or sizable effusion is seen. No bony abnormality is noted. IMPRESSION: No active disease. Electronically Signed   By: Inez Catalina M.D.   On: 07/26/2021 17:58    Labs on Admission: I have personally reviewed following labs  CBC: Recent Labs  Lab 07/26/21 1330  WBC 19.9*  NEUTROABS 16.9*  HGB 11.5*  HCT 35.3*  MCV 86.7  PLT 98*   Basic Metabolic Panel: Recent Labs  Lab 07/26/21 1330  NA 126*  K 5.0  CL 94*  CO2 21*  GLUCOSE 132*  BUN 34*  CREATININE 1.13*  CALCIUM 9.4   GFR: Estimated Creatinine Clearance: 45 mL/min (A) (by C-G formula based on SCr of 1.13 mg/dL (H)).  Liver Function Tests: Recent Labs  Lab 07/26/21 1330  AST 128*  ALT 172*  ALKPHOS 140*  BILITOT 0.8  PROT 7.3  ALBUMIN 3.4*   Recent Labs  Lab 07/26/21 1330  LIPASE 47   Urine analysis:    Component Value Date/Time   COLORURINE YELLOW (A) 07/26/2021 1700   APPEARANCEUR CLOUDY (A) 07/26/2021 1700   APPEARANCEUR Clear 04/28/2021 1337   LABSPEC 1.006 07/26/2021 1700   LABSPEC 1.006 01/28/2015 1549   PHURINE 6.0 07/26/2021 1700   GLUCOSEU NEGATIVE 07/26/2021 1700   GLUCOSEU NEGATIVE 12/05/2020 1250   HGBUR MODERATE (A) 07/26/2021 1700   BILIRUBINUR NEGATIVE 07/26/2021 1700   BILIRUBINUR Negative 04/28/2021 1337   BILIRUBINUR Negative 01/28/2015 1549   KETONESUR NEGATIVE 07/26/2021 1700   PROTEINUR 30 (A) 07/26/2021 1700   UROBILINOGEN 0.2 12/05/2020 1250   NITRITE NEGATIVE 07/26/2021 1700   LEUKOCYTESUR LARGE (A) 07/26/2021 1700   LEUKOCYTESUR Trace 01/28/2015 1549   Dr. Tobie Poet [facility]  If 7PM-7AM, please contact overnight-coverage provider If 7AM-7PM, please contact day coverage provider [email or web address]  07/26/2021, 10:49 PM

## 2021-07-26 NOTE — ED Triage Notes (Signed)
Pt comes into the ED via POV with her brother c/o N/V, and facial droop.  According to the family, the facial droop has been there for a while, but has never been evaluated.  Pt states the N/V started a couple days ago.  Pt in NAD at this time with even and unlabored respirations.

## 2021-07-26 NOTE — ED Notes (Signed)
Mri called at screened patient

## 2021-07-26 NOTE — ED Provider Notes (Signed)
[facility]  ____________________________________________   Event Date/Time   First MD Initiated Contact with Patient 07/26/21 1532     (approximate)  I have reviewed the triage vital signs and the nursing notes.   HISTORY  Chief Complaint Facial Droop and Emesis    HPI Amy Mcconnell is a 70 y.o. female past medical history of hypertension, female, GERD who presents with generalized weakness.  Patient had a fall on Sunday.  She was feeding the birds when she suddenly went weak and fell to the ground.  She did not hit her head fell onto her knees but was unable to get up on her own.  Over the last several days she has felt generally more fatigued and weak.  Today she was on the ground in her room doing something and again was unable to get up.  Patient's son notes that she has been a little bit more confused than normal.  She has been slurring her speech intermittently.  She has a chronic facial droop which is not new.  Patient denies any new focal numbness or weakness or difficulty with her vision.  Last week she did have nausea vomiting and diarrhea which she thought was due to food poisoning.  This is now resolved she has noted ongoing abdominal pain.  She denies dysuria.  She was not aware that she had a fever.  No cough or congestion.         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 Thurmond Butts - every 3 months   Broken foot    left    Contact dermatitis and other eczema due to other specified agent    Contusion of unspecified part of lower limb    Essential hypertension, benign    Heart murmur    Other and unspecified hyperlipidemia    Other specified disorders of thyroid    Reflux esophagitis    Tachycardia    a. 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 Authorizing Provider  ACETAMINOPHEN PO Take 650 mg by mouth in the morning and at bedtime.     [provider]  amLODipine (NORVASC) 5 MG tablet Take 1 tablet by mouth once daily 01/23/21   Minna Merritts, MD  Ascorbic Acid (VITAMIN C) 1000 MG tablet Take 1,000 mg by mouth daily.    [provider]  aspirin 81 MG tablet Take 81 mg by mouth daily.    [provider]  atorvastatin (LIPITOR) 40 MG tablet  Take 1 tablet (40 mg total) by mouth daily. 07/24/21   Leone Haven, MD  B Complex Vitamins (VITAMIN B COMPLEX PO) Take by mouth daily.    [provider]  BIOTIN 5000 PO Take by mouth daily.    [provider]  calcium carbonate (OS-CAL) 600 MG TABS tablet Take 600 mg by mouth daily.     [provider]  Cholecalciferol (VITAMIN D3) 50 MCG (2000 UT) capsule Take 2,000 Units by mouth daily.    [provider]  diphenhydrAMINE (BENADRYL) 25 mg capsule Take 25 mg by mouth daily as needed for allergies. For food and fragrance sensitivities    [provider]  Docusate Sodium (RA COL-RITE PO) Take 1 capsule by mouth daily.    [provider]  doxazosin (CARDURA) 8 MG tablet Take 1 tablet by mouth twice daily  02/13/21   Minna Merritts, MD  EPINEPHrine 0.3 mg/0.3 mL IJ SOAJ injection Inject 0.3 mLs (0.3 mg total) into the muscle once. 03/30/16   Leone Haven, MD  fexofenadine (ALLEGRA) 180 MG tablet Take 180 mg by mouth. 2-3 times daily prn For food and fragrance sensitivities/intolerances    [provider]  Flaxseed, Linseed, (FLAXSEED OIL PO) Take by mouth daily.    [provider]  levothyroxine (SYNTHROID, LEVOTHROID) 125 MCG tablet Take 125 mcg by mouth daily before breakfast.    [provider]  lithium carbonate 150 MG capsule Take 150 mg by mouth 2 (two) times daily with a meal.    [provider]  magnesium oxide (MAG-OX) 400 MG tablet Take 400 mg by mouth daily.    [provider]  Multiple Vitamins-Minerals (MULTIVITAMIN WITH MINERALS) tablet Take 1 tablet by mouth daily.    [provider]  Multiple Vitamins-Minerals (VISION FORMULA/LUTEIN) TABS Take by mouth in the morning and at bedtime.    [provider]  Omega-3 Fatty Acids (FISH OIL) 1000 MG CAPS Take 3 capsules by mouth daily.     [provider]  Oxcarbazepine (TRILEPTAL) 300 MG tablet Take 300 mg one tablet in the am & two tablets in the pm.    [provider]  propranolol ER (INDERAL LA) 60 MG 24 hr capsule Take 1 capsule by mouth once daily 12/21/20   Minna Merritts, MD  vitamin B-12 (CYANOCOBALAMIN) 1000 MCG tablet Take 1,000 mcg by mouth daily.    [provider]  vitamin E 1000 UNIT capsule Take 1,000 Units by mouth daily.    [provider]  zinc gluconate 50 MG tablet Take 50 mg by mouth daily.    [provider]  ziprasidone (GEODON) 20 MG capsule Take 20 mg by mouth daily.    [provider]    Allergies Ace inhibitors, Angiotensin receptor blockers, Benicar [olmesartan], Poison ivy extract [poison ivy extract], Purell instant hand [alcohol], and Triclosan  Family History  Problem Relation Age of  Onset   Arrhythmia Mother        A-Fib   Heart failure Mother    Hyperlipidemia Mother    Hypertension Mother    Alzheimer's disease Mother    Hypertension Father    Hyperlipidemia Father    Mental retardation Father 73       Suicide   Hypertension Brother    Hyperlipidemia Brother    Hypertension Brother    Hyperlipidemia Brother    Hyperlipidemia Brother    Hypertension Brother    Alcohol abuse Brother  Depression Brother    Hyperlipidemia Brother    Hypertension Brother    Breast cancer Maternal Aunt     Social History Social History   Tobacco Use   Smoking status: Former    Packs/day: 0.50    Years: 13.00    Pack years: 6.50    Types: Cigarettes    Quit date: 01/23/2000    Years since quitting: 21.5   Smokeless tobacco: Never  Vaping Use   Vaping Use: Never used  Substance Use Topics   Alcohol use: No   Drug use: No    Review of Systems   Review of Systems  Constitutional:  Positive for appetite change and fatigue. Negative for fever.  Respiratory:  Negative for shortness of breath.   Cardiovascular:  Negative for chest pain, palpitations and leg swelling.  Gastrointestinal:  Negative for abdominal pain, constipation, diarrhea, nausea and vomiting.  Genitourinary:  Negative for dysuria.  Neurological:  Positive for weakness and light-headedness. Negative for numbness and headaches.  All other systems reviewed and are negative.  Physical Exam Updated Vital Signs BP 108/60 (BP Location: Right Arm)   Pulse 84   Temp 98.1 F (36.7 C) (Oral)   Resp 17   Ht 5\' 7"  (1.702 m)   Wt 63.1 kg   SpO2 98%   BMI 21.79 kg/m   Physical Exam Vitals and nursing note reviewed.  Constitutional:      General: She is not in acute distress.    Appearance: Normal appearance.  HENT:     Head: Normocephalic and atraumatic.     Nose: Nose normal.     Mouth/Throat:     Mouth: Mucous membranes are dry.  Eyes:     General: No scleral icterus.    Conjunctiva/sclera:  Conjunctivae normal.  Cardiovascular:     Rate and Rhythm: Normal rate and regular rhythm.  Pulmonary:     Effort: Pulmonary effort is normal. No respiratory distress.     Breath sounds: No stridor.  Abdominal:     General: Abdomen is flat. There is no distension.     Palpations: Abdomen is soft.     Tenderness: There is no abdominal tenderness. There is no guarding.  Musculoskeletal:        General: No deformity or signs of injury.     Cervical back: Normal range of motion.  Skin:    General: Skin is dry.     Coloration: Skin is not jaundiced or pale.  Neurological:     Mental Status: She is alert and oriented to person, place, and time. Mental status is at baseline.     Comments: Chronic left-sided facial droop, patient has 5 out of 5 strength in the bilateral upper and lower extremities, no aphasia, she is alert and oriented x3  Psychiatric:        Mood and Affect: Mood normal.        Behavior: Behavior normal.     LABS (all labs ordered are listed, but only abnormal results are displayed)  Labs Reviewed  CBC - Abnormal; Notable for the following components:      Result Value   WBC 19.9 (*)    Hemoglobin 11.5 (*)    HCT 35.3 (*)    Platelets 98 (*)    All other components within normal limits  DIFFERENTIAL - Abnormal; Notable for the following components:   Neutro Abs 16.9 (*)    Lymphs Abs 0.6 (*)    Monocytes Absolute 2.1 (*)  Abs Immature Granulocytes 0.23 (*)    All other components within normal limits  COMPREHENSIVE METABOLIC PANEL - Abnormal; Notable for the following components:   Sodium 126 (*)    Chloride 94 (*)    CO2 21 (*)    Glucose, Bld 132 (*)    BUN 34 (*)    Creatinine, Ser 1.13 (*)    Albumin 3.4 (*)    AST 128 (*)    ALT 172 (*)    Alkaline Phosphatase 140 (*)    GFR, Estimated 52 (*)    All other components within normal limits  URINALYSIS, COMPLETE (UACMP) WITH MICROSCOPIC - Abnormal; Notable for the following components:   Color,  Urine YELLOW (*)    APPearance CLOUDY (*)    Hgb urine dipstick MODERATE (*)    Protein, ur 30 (*)    Leukocytes,Ua LARGE (*)    Bacteria, UA MANY (*)    All other components within normal limits  RESP PANEL BY RT-PCR (FLU A&B, COVID) ARPGX2  CULTURE, BLOOD (ROUTINE X 2)  CULTURE, BLOOD (ROUTINE X 2)  URINE CULTURE  LIPASE, BLOOD  PROTIME-INR  APTT  LACTIC ACID, PLASMA  LACTIC ACID, PLASMA  TROPONIN I (HIGH SENSITIVITY)   ____________________________________________  EKG  Normal sinus rhythm, normal axis, normal intervals, no acute ischemic changes ____________________________________________  RADIOLOGY I, Madelin Headings, personally viewed and evaluated these images (plain radiographs) as part of my medical decision making, as well as reviewing the written report by the radiologist.  ED MD interpretation: I reviewed the chest x-ray which does not show any acute cardiopulmonary process    ____________________________________________   PROCEDURES  Procedure(s) performed (including Critical Care):  Procedures   ____________________________________________   INITIAL IMPRESSION / ASSESSMENT AND PLAN / ED COURSE     Is a 70 year old female presents with generalized weakness and malaise.  She has been unable to get up twice and had 1 fall.  Patient really has no focal symptoms of infection.  She does have a leukocytosis of 19 and hyponatremia to 126.  Her AST and ALT are significantly elevated.  Patient notes that she was told that these were elevated due to her statin and this dose was reduced.  On my exam she does have some right upper quadrant tenderness but abdomen is overall benign.  She looks well.  Does have scrapes on her bilateral knees from crawling on the ground after her fall but no other signs of trauma.  She has a facial droop which she says is chronic and no other acute weakness on neurologic exam.  I obtained a CT abdomen pelvis and her LFTs and  leukocytosis which i shows some hydronephrosis but no stone, potentially consistent with a recently passed stone.  She has no biliary abnormality.  Her UA is consistent with UTI which may be causing her symptoms we will treat with Rocephin.  Blood cultures and urine culture sent.  We will also add on a lactate.  Given she is elderly and globally weak with fever and meeting sepsis criteria will admit.      ____________________________________________   FINAL CLINICAL IMPRESSION(S) / ED DIAGNOSES  Final diagnoses:  Urinary tract infection without hematuria, site unspecified  Transaminitis  AKI (acute kidney injury) [facility])     ED Discharge Orders     None        Note:  This document was prepared using Dragon voice recognition software and may include unintentional dictation errors.    Acquanetta Belling  Kalman Shan, MD 07/26/21 1504

## 2021-07-27 ENCOUNTER — Observation Stay: Payer: Medicare HMO

## 2021-07-27 DIAGNOSIS — B9620 Unspecified Escherichia coli [E. coli] as the cause of diseases classified elsewhere: Secondary | ICD-10-CM | POA: Diagnosis not present

## 2021-07-27 DIAGNOSIS — N281 Cyst of kidney, acquired: Secondary | ICD-10-CM | POA: Diagnosis not present

## 2021-07-27 DIAGNOSIS — B9629 Other Escherichia coli [E. coli] as the cause of diseases classified elsewhere: Secondary | ICD-10-CM | POA: Diagnosis not present

## 2021-07-27 DIAGNOSIS — Z20822 Contact with and (suspected) exposure to covid-19: Secondary | ICD-10-CM | POA: Diagnosis present

## 2021-07-27 DIAGNOSIS — F419 Anxiety disorder, unspecified: Secondary | ICD-10-CM | POA: Diagnosis present

## 2021-07-27 DIAGNOSIS — I10 Essential (primary) hypertension: Secondary | ICD-10-CM | POA: Diagnosis present

## 2021-07-27 DIAGNOSIS — R160 Hepatomegaly, not elsewhere classified: Secondary | ICD-10-CM | POA: Diagnosis not present

## 2021-07-27 DIAGNOSIS — I2510 Atherosclerotic heart disease of native coronary artery without angina pectoris: Secondary | ICD-10-CM | POA: Diagnosis present

## 2021-07-27 DIAGNOSIS — R6520 Severe sepsis without septic shock: Secondary | ICD-10-CM | POA: Diagnosis present

## 2021-07-27 DIAGNOSIS — A498 Other bacterial infections of unspecified site: Secondary | ICD-10-CM | POA: Diagnosis not present

## 2021-07-27 DIAGNOSIS — Z1612 Extended spectrum beta lactamase (ESBL) resistance: Secondary | ICD-10-CM | POA: Diagnosis present

## 2021-07-27 DIAGNOSIS — N1330 Unspecified hydronephrosis: Secondary | ICD-10-CM | POA: Diagnosis present

## 2021-07-27 DIAGNOSIS — A419 Sepsis, unspecified organism: Secondary | ICD-10-CM | POA: Diagnosis not present

## 2021-07-27 DIAGNOSIS — R7401 Elevation of levels of liver transaminase levels: Secondary | ICD-10-CM | POA: Diagnosis not present

## 2021-07-27 DIAGNOSIS — K828 Other specified diseases of gallbladder: Secondary | ICD-10-CM | POA: Diagnosis not present

## 2021-07-27 DIAGNOSIS — N3000 Acute cystitis without hematuria: Secondary | ICD-10-CM | POA: Diagnosis not present

## 2021-07-27 DIAGNOSIS — L237 Allergic contact dermatitis due to plants, except food: Secondary | ICD-10-CM | POA: Diagnosis present

## 2021-07-27 DIAGNOSIS — D696 Thrombocytopenia, unspecified: Secondary | ICD-10-CM | POA: Diagnosis present

## 2021-07-27 DIAGNOSIS — N179 Acute kidney failure, unspecified: Secondary | ICD-10-CM | POA: Diagnosis present

## 2021-07-27 DIAGNOSIS — R7881 Bacteremia: Secondary | ICD-10-CM | POA: Diagnosis not present

## 2021-07-27 DIAGNOSIS — R29810 Facial weakness: Secondary | ICD-10-CM | POA: Diagnosis present

## 2021-07-27 DIAGNOSIS — R258 Other abnormal involuntary movements: Secondary | ICD-10-CM | POA: Diagnosis present

## 2021-07-27 DIAGNOSIS — A4151 Sepsis due to Escherichia coli [E. coli]: Secondary | ICD-10-CM | POA: Diagnosis present

## 2021-07-27 DIAGNOSIS — F319 Bipolar disorder, unspecified: Secondary | ICD-10-CM | POA: Diagnosis present

## 2021-07-27 DIAGNOSIS — F259 Schizoaffective disorder, unspecified: Secondary | ICD-10-CM | POA: Diagnosis present

## 2021-07-27 DIAGNOSIS — K2100 Gastro-esophageal reflux disease with esophagitis, without bleeding: Secondary | ICD-10-CM | POA: Diagnosis present

## 2021-07-27 DIAGNOSIS — I479 Paroxysmal tachycardia, unspecified: Secondary | ICD-10-CM | POA: Diagnosis present

## 2021-07-27 DIAGNOSIS — H532 Diplopia: Secondary | ICD-10-CM | POA: Diagnosis present

## 2021-07-27 DIAGNOSIS — D649 Anemia, unspecified: Secondary | ICD-10-CM | POA: Diagnosis present

## 2021-07-27 DIAGNOSIS — W010XXA Fall on same level from slipping, tripping and stumbling without subsequent striking against object, initial encounter: Secondary | ICD-10-CM | POA: Diagnosis present

## 2021-07-27 DIAGNOSIS — E039 Hypothyroidism, unspecified: Secondary | ICD-10-CM | POA: Diagnosis present

## 2021-07-27 DIAGNOSIS — E871 Hypo-osmolality and hyponatremia: Secondary | ICD-10-CM | POA: Diagnosis present

## 2021-07-27 DIAGNOSIS — N390 Urinary tract infection, site not specified: Secondary | ICD-10-CM | POA: Diagnosis present

## 2021-07-27 DIAGNOSIS — E785 Hyperlipidemia, unspecified: Secondary | ICD-10-CM | POA: Diagnosis present

## 2021-07-27 DIAGNOSIS — F3170 Bipolar disorder, currently in remission, most recent episode unspecified: Secondary | ICD-10-CM | POA: Diagnosis not present

## 2021-07-27 DIAGNOSIS — E869 Volume depletion, unspecified: Secondary | ICD-10-CM | POA: Diagnosis present

## 2021-07-27 LAB — CBC WITH DIFFERENTIAL/PLATELET
Abs Immature Granulocytes: 0.14 10*3/uL — ABNORMAL HIGH (ref 0.00–0.07)
Basophils Absolute: 0 10*3/uL (ref 0.0–0.1)
Basophils Relative: 0 %
Eosinophils Absolute: 0.1 10*3/uL (ref 0.0–0.5)
Eosinophils Relative: 0 %
HCT: 30.4 % — ABNORMAL LOW (ref 36.0–46.0)
Hemoglobin: 10.2 g/dL — ABNORMAL LOW (ref 12.0–15.0)
Immature Granulocytes: 1 %
Lymphocytes Relative: 5 %
Lymphs Abs: 1 10*3/uL (ref 0.7–4.0)
MCH: 28.8 pg (ref 26.0–34.0)
MCHC: 33.6 g/dL (ref 30.0–36.0)
MCV: 85.9 fL (ref 80.0–100.0)
Monocytes Absolute: 1.7 10*3/uL — ABNORMAL HIGH (ref 0.1–1.0)
Monocytes Relative: 8 %
Neutro Abs: 17.7 10*3/uL — ABNORMAL HIGH (ref 1.7–7.7)
Neutrophils Relative %: 86 %
Platelets: 98 10*3/uL — ABNORMAL LOW (ref 150–400)
RBC: 3.54 MIL/uL — ABNORMAL LOW (ref 3.87–5.11)
RDW: 14.9 % (ref 11.5–15.5)
WBC: 20.6 10*3/uL — ABNORMAL HIGH (ref 4.0–10.5)
nRBC: 0 % (ref 0.0–0.2)

## 2021-07-27 LAB — BASIC METABOLIC PANEL
Anion gap: 10 (ref 5–15)
BUN: 29 mg/dL — ABNORMAL HIGH (ref 8–23)
CO2: 18 mmol/L — ABNORMAL LOW (ref 22–32)
Calcium: 8.7 mg/dL — ABNORMAL LOW (ref 8.9–10.3)
Chloride: 102 mmol/L (ref 98–111)
Creatinine, Ser: 1.06 mg/dL — ABNORMAL HIGH (ref 0.44–1.00)
GFR, Estimated: 57 mL/min — ABNORMAL LOW (ref >60)
Glucose, Bld: 126 mg/dL — ABNORMAL HIGH (ref 70–99)
Potassium: 4.1 mmol/L (ref 3.5–5.1)
Sodium: 130 mmol/L — ABNORMAL LOW (ref 135–145)

## 2021-07-27 LAB — BLOOD CULTURE ID PANEL (REFLEXED) - BCID2

## 2021-07-27 LAB — CORTISOL-AM, BLOOD: Cortisol - AM: 52.4 ug/dL — ABNORMAL HIGH (ref 6.7–22.6)

## 2021-07-27 LAB — LITHIUM LEVEL: Lithium Lvl: 0.86 mmol/L (ref 0.60–1.20)

## 2021-07-27 LAB — PROTIME-INR
INR: 1.3 — ABNORMAL HIGH (ref 0.8–1.2)
Prothrombin Time: 15.7 seconds — ABNORMAL HIGH (ref 11.4–15.2)

## 2021-07-27 LAB — HIV ANTIBODY (ROUTINE TESTING W REFLEX): HIV Screen 4th Generation wRfx: NONREACTIVE

## 2021-07-27 LAB — PHOSPHORUS: Phosphorus: 3.1 mg/dL (ref 2.5–4.6)

## 2021-07-27 LAB — LACTIC ACID, PLASMA: Lactic Acid, Venous: 0.8 mmol/L (ref 0.5–1.9)

## 2021-07-27 LAB — MAGNESIUM: Magnesium: 2.1 mg/dL (ref 1.7–2.4)

## 2021-07-27 MED ORDER — SODIUM CHLORIDE 0.9 % IV SOLN
1.0000 g | Freq: Two times a day (BID) | INTRAVENOUS | Status: DC
  Administered 2021-07-27 – 2021-07-28 (×4): 1 g via INTRAVENOUS
  Filled 2021-07-27 (×6): qty 1

## 2021-07-27 NOTE — Plan of Care (Signed)
  Problem: Clinical Measurements: Goal: Diagnostic test results will improve Outcome: Progressing   Problem: Activity: Goal: Risk for activity intolerance will decrease Outcome: Progressing   Problem: Nutrition: Goal: Adequate nutrition will be maintained Outcome: Progressing   

## 2021-07-27 NOTE — Progress Notes (Signed)
PHARMACY - PHYSICIAN COMMUNICATION CRITICAL VALUE ALERT - BLOOD CULTURE IDENTIFICATION (BCID)  Amy Mcconnell is an 70 y.o. female who presented to [facility] on 07/26/2021 with a chief complaint of nausea and vomiting  Assessment:  Admitted for sepsis, found to have ESBL Ecoli bacteremia.  Source thought to be urinary  Name of physician (or Provider) Contacted: Dr. Loleta Books notified  Current antibiotics: Ceftriaxone  Changes to prescribed antibiotics recommended: Stop ceftriaxone and start meropenem 1g q12h.  Orders placed   Results for orders placed or performed during the hospital encounter of 07/26/21  Blood Culture ID Panel (Reflexed) (Collected: 07/26/2021  5:50 PM)  Result Value Ref Range   Enterococcus faecalis NOT DETECTED NOT DETECTED   Enterococcus Faecium NOT DETECTED NOT DETECTED   Listeria monocytogenes NOT DETECTED NOT DETECTED   Staphylococcus species NOT DETECTED NOT DETECTED   Staphylococcus aureus (BCID) NOT DETECTED NOT DETECTED   Staphylococcus epidermidis NOT DETECTED NOT DETECTED   Staphylococcus lugdunensis NOT DETECTED NOT DETECTED   Streptococcus species NOT DETECTED NOT DETECTED   Streptococcus agalactiae NOT DETECTED NOT DETECTED   Streptococcus pneumoniae NOT DETECTED NOT DETECTED   Streptococcus pyogenes NOT DETECTED NOT DETECTED   A.calcoaceticus-baumannii NOT DETECTED NOT DETECTED   Bacteroides fragilis NOT DETECTED NOT DETECTED   Enterobacterales DETECTED (A) NOT DETECTED   Enterobacter cloacae complex NOT DETECTED NOT DETECTED   Escherichia coli DETECTED (A) NOT DETECTED   Klebsiella aerogenes NOT DETECTED NOT DETECTED   Klebsiella oxytoca NOT DETECTED NOT DETECTED   Klebsiella pneumoniae NOT DETECTED NOT DETECTED   Proteus species NOT DETECTED NOT DETECTED   Salmonella species NOT DETECTED NOT DETECTED   Serratia marcescens NOT DETECTED NOT DETECTED   Haemophilus influenzae NOT DETECTED NOT DETECTED   Neisseria meningitidis NOT DETECTED NOT  DETECTED   Pseudomonas aeruginosa NOT DETECTED NOT DETECTED   Stenotrophomonas maltophilia NOT DETECTED NOT DETECTED   Candida albicans NOT DETECTED NOT DETECTED   Candida auris NOT DETECTED NOT DETECTED   Candida glabrata NOT DETECTED NOT DETECTED   Candida krusei NOT DETECTED NOT DETECTED   Candida parapsilosis NOT DETECTED NOT DETECTED   Candida tropicalis NOT DETECTED NOT DETECTED   Cryptococcus neoformans/gattii NOT DETECTED NOT DETECTED   CTX-M ESBL DETECTED (A) NOT DETECTED   Carbapenem resistance IMP NOT DETECTED NOT DETECTED   Carbapenem resistance KPC NOT DETECTED NOT DETECTED   Carbapenem resistance NDM NOT DETECTED NOT DETECTED   Carbapenem resist OXA 48 LIKE NOT DETECTED NOT DETECTED   Carbapenem resistance VIM NOT DETECTED NOT DETECTED    [name] 07/27/2021  7:22 AM

## 2021-07-27 NOTE — Progress Notes (Signed)
[facility] PROGRESS NOTE    A[place]  SNK:[id number] DOB: 1951-11-02 DOA: 07/26/2021 PCP: Amy Haven, MD      Brief Narrative:  Amy [place] is a 70 y.o. F with bipolar disorder, schizophrenia, HTN, hypothyroidism who presented with nausea and vomiting.  Evidently the patient's been sick at home for several days with malaise, nausea, weakness, and vomiting as well as dysuria, polyuria and urgency.  Family she was getting worse so she came to the ER, where she was febrile, WBC 19.9K, platelets less than 100K.  Urinalysis suggsted infection.  Started on antibiotics and admitted.     Assessment & Plan:  Severe sepsis due to ESBL E. coli bacteremia, possible urinary source Patient presented with thrombocytopenia, transaminitis, and bacteremia.  Blood cultures growing ESBL in 2 of 2. - Continue meropenem    Facial droop Double vision MRI normal  Hypothyroidism -Continue levothyroxine  Bipolar and schizophrenia -Continue Geodon, oxcarbazepine, lithium  Hypertension BP soft -Continue aspirin, atorvastatin, doxazosin -Hold amlodipine, propranolol  Hyponatremia Mild, asymptomatic         Disposition: Status is: Inpatient  Remains inpatient appropriate because:IV treatments appropriate due to intensity of illness or inability to take PO  Dispo: The patient is from: Home              Anticipated d/c is to:  TBD              Patient currently is not medically stable to d/c.   Difficult to place patient No       Level of care: Med-Surg       MDM: The below labs and imaging reports were reviewed and summarized above.  Medication management as above.    DVT prophylaxis: Place TED hose Start: 07/26/21 1803  Code Status: Full code Family Communication: Amy [place] by phone    Consultants:    Procedures:    Antimicrobials:  Ceftriaxone x1 on 9/21 Meropenem 9/22 >>   Culture data:  9/.21 Blood culture 2/2  with ESBL 9/21 urine culture pending          Subjective: Patient feels well.  No headache, chest pain, dyspnea, abdominal pain.  Objective: Vitals:   07/27/21 0700 07/27/21 1000 07/27/21 1200 07/27/21 1547  BP: 108/60 (!) 110/56 107/60 (!) 105/53  Pulse: 79 80 79 80  Resp: 18 14 (!) 22 18  Temp:      TempSrc:      SpO2: 90% 96% 97% 96%  Weight:      Height:        Intake/Output Summary (Last 24 hours) at 07/27/2021 1616 Last data filed at 07/26/2021 1907 Gross per 24 hour  Intake 1100 ml  Output --  Net 1100 ml   Filed Weights   07/26/21 1328  Weight: 63.1 kg    Examination: General appearance:  adult female, alert and in no acute distress.   HEENT: Anicteric, conjunctiva pink, lids and lashes normal. No nasal deformity, discharge, epistaxis.  Lips moist.   Skin: Warm and dry.  no jaundice.  No suspicious rashes or lesions. Cardiac: RRR, nl S1-S2, no murmurs appreciated.  Capillary refill is brisk.  JVP normal.  No LE edema.  Radial  pulses 2+ and symmetric. Respiratory: Normal respiratory rate and rhythm.  CTAB without rales or wheezes. Abdomen: Abdomen soft.  no TTP. No ascites, distension, hepatosplenomegaly.   MSK: No deformities or effusions. Neuro: Awake and alert.  EOMI, moves all extremities. Speech fluent.   Left  facial droop. Psych: Sensorium intact and responding to questions, attention normal. Affect appropriate.  Judgment and insight appear norma.    Data Reviewed: I have personally reviewed following labs and imaging studies:  CBC: Recent Labs  Lab 07/26/21 1330 07/27/21 0724  WBC 19.9* 20.6*  NEUTROABS 16.9* 17.7*  HGB 11.5* 10.2*  HCT 35.3* 30.4*  MCV 86.7 85.9  PLT 98* 98*   Basic Metabolic Panel: Recent Labs  Lab 07/26/21 1330 07/27/21 0724  NA 126* 130*  K 5.0 4.1  CL 94* 102  CO2 21* 18*  GLUCOSE 132* 126*  BUN 34* 29*  CREATININE 1.13* 1.06*  CALCIUM 9.4 8.7*  MG  --  2.1  PHOS  --  3.1   GFR: Estimated Creatinine  Clearance: 48 mL/min (A) (by C-G formula based on SCr of 1.06 mg/dL (H)). Liver Function Tests: Recent Labs  Lab 07/26/21 1330  AST 128*  ALT 172*  ALKPHOS 140*  BILITOT 0.8  PROT 7.3  ALBUMIN 3.4*   Recent Labs  Lab 07/26/21 1330  LIPASE 47   No results for input(s): AMMONIA in the last 168 hours. Coagulation Profile: Recent Labs  Lab 07/26/21 2245 07/27/21 0724  INR 1.2 1.3*   Cardiac Enzymes: No results for input(s): CKTOTAL, CKMB, CKMBINDEX, TROPONINI in the last 168 hours. BNP (last 3 results) No results for input(s): PROBNP in the last 8760 hours. HbA1C: No results for input(s): HGBA1C in the last 72 hours. CBG: No results for input(s): GLUCAP in the last 168 hours. Lipid Profile: No results for input(s): CHOL, HDL, LDLCALC, TRIG, CHOLHDL, LDLDIRECT in the last 72 hours. Thyroid Function Tests: No results for input(s): TSH, T4TOTAL, FREET4, T3FREE, THYROIDAB in the last 72 hours. Anemia Panel: No results for input(s): VITAMINB12, FOLATE, FERRITIN, TIBC, IRON, RETICCTPCT in the last 72 hours. Urine analysis:    Component Value Date/Time   COLORURINE YELLOW (A) 07/26/2021 1700   APPEARANCEUR CLOUDY (A) 07/26/2021 1700   APPEARANCEUR Clear 04/28/2021 1337   LABSPEC 1.006 07/26/2021 1700   LABSPEC 1.006 01/28/2015 1549   PHURINE 6.0 07/26/2021 1700   GLUCOSEU NEGATIVE 07/26/2021 1700   GLUCOSEU NEGATIVE 12/05/2020 1250   HGBUR MODERATE (A) 07/26/2021 1700   BILIRUBINUR NEGATIVE 07/26/2021 1700   BILIRUBINUR Negative 04/28/2021 1337   BILIRUBINUR Negative 01/28/2015 1549   KETONESUR NEGATIVE 07/26/2021 1700   PROTEINUR 30 (A) 07/26/2021 1700   UROBILINOGEN 0.2 12/05/2020 1250   NITRITE NEGATIVE 07/26/2021 1700   LEUKOCYTESUR LARGE (A) 07/26/2021 1700   LEUKOCYTESUR Trace 01/28/2015 1549   Sepsis Labs: @LABRCNTIP (procalcitonin:4,lacticacidven:4)  ) Recent Results (from the past 240 hour(s))  Resp Panel by RT-PCR (Flu A&B, Covid) Nasopharyngeal Swab      Status: None   Collection Time: 07/26/21  4:15 PM   Specimen: Nasopharyngeal Swab; Nasopharyngeal(NP) swabs in vial transport medium  Result Value Ref Range Status   SARS Coronavirus 2 by RT PCR NEGATIVE NEGATIVE Final    Comment: (NOTE) SARS-CoV-2 target nucleic acids are NOT DETECTED.  The SARS-CoV-2 RNA is generally detectable in upper respiratory specimens during the acute phase of infection. The lowest concentration of SARS-CoV-2 viral copies this assay can detect is 138 copies/mL. A negative result does not preclude SARS-Cov-2 infection and should not be used as the sole basis for treatment or other patient management decisions. A negative result may occur with  improper specimen collection/handling, submission of specimen other than nasopharyngeal swab, presence of viral mutation(s) within the areas targeted by this assay, and inadequate number of viral  copies(<138 copies/mL). A negative result must be combined with clinical observations, patient history, and epidemiological information. The expected result is Negative.  Fact Sheet for Patients:  EntrepreneurPulse.com.au  Fact Sheet for Healthcare Providers:  IncredibleEmployment.be  This test is no t yet approved or cleared by the [place] FDA and  has been authorized for detection and/or diagnosis of SARS-CoV-2 by FDA under an Emergency Use Authorization (EUA). This EUA will remain  in effect (meaning this test can be used) for the duration of the COVID-19 declaration under Section 564(b)(1) of the Act, 21 U.S.C.section 360bbb-3(b)(1), unless the authorization is terminated  or revoked sooner.       Influenza A by PCR NEGATIVE NEGATIVE Final   Influenza B by PCR NEGATIVE NEGATIVE Final    Comment: (NOTE) The Xpert Xpress SARS-CoV-2/FLU/RSV plus assay is intended as an aid in the diagnosis of influenza from Nasopharyngeal swab specimens and should not be used as a sole basis  for treatment. Nasal washings and aspirates are unacceptable for Xpert Xpress SARS-CoV-2/FLU/RSV testing.  Fact Sheet for Patients: EntrepreneurPulse.com.au  Fact Sheet for Healthcare Providers: IncredibleEmployment.be  This test is not yet approved or cleared by the [place] FDA and has been authorized for detection and/or diagnosis of SARS-CoV-2 by FDA under an Emergency Use Authorization (EUA). This EUA will remain in effect (meaning this test can be used) for the duration of the COVID-19 declaration under Section 564(b)(1) of the Act, 21 U.S.C. section 360bbb-3(b)(1), unless the authorization is terminated or revoked.  Performed at [facility], [address] [place], [place] [id number]   Blood culture (routine x 2)     Status: None (Preliminary result)   Collection Time: 07/26/21  5:50 PM   Specimen: BLOOD  Result Value Ref Range Status   Specimen Description BLOOD RIGHT ANTECUBITAL  Final   Special Requests   Final    BOTTLES DRAWN AEROBIC AND ANAEROBIC Blood Culture results may not be optimal due to an inadequate volume of blood received in culture bottles   Culture  Setup Time   Final    GRAM NEGATIVE RODS IN BOTH AEROBIC AND ANAEROBIC BOTTLES CRITICAL VALUE NOTED.  VALUE IS CONSISTENT WITH PREVIOUSLY REPORTED AND CALLED VALUE. Performed at [facility], [address] [place], [place] [address]    Culture GRAM NEGATIVE RODS  Final   Report Status PENDING  Incomplete  Blood culture (routine x 2)     Status: None (Preliminary result)   Collection Time: 07/26/21  5:50 PM   Specimen: BLOOD  Result Value Ref Range Status   Specimen Description BLOOD BLOOD RIGHT FOREARM  Final   Special Requests   Final    BOTTLES DRAWN AEROBIC AND ANAEROBIC Blood Culture results may not be optimal due to an inadequate volume of blood received in culture bottles   Culture  Setup Time   Final    GRAM NEGATIVE RODS IN BOTH  AEROBIC AND ANAEROBIC BOTTLES Organism ID to follow CRITICAL RESULT CALLED TO, READ BACK BY AND VERIFIED WITH: NATHAN BLUE@0300  07/27/21 RH Performed at [facility], [address] [place], [place] [address]    Culture GRAM NEGATIVE RODS  Final   Report Status PENDING  Incomplete  Blood Culture ID Panel (Reflexed)     Status: Abnormal   Collection Time: 07/26/21  5:50 PM  Result Value Ref Range Status   Enterococcus faecalis NOT DETECTED NOT DETECTED Final   Enterococcus Faecium NOT DETECTED NOT DETECTED Final   Listeria monocytogenes  NOT DETECTED NOT DETECTED Final   Staphylococcus species NOT DETECTED NOT DETECTED Final   Staphylococcus aureus (BCID) NOT DETECTED NOT DETECTED Final   Staphylococcus epidermidis NOT DETECTED NOT DETECTED Final   Staphylococcus lugdunensis NOT DETECTED NOT DETECTED Final   Streptococcus species NOT DETECTED NOT DETECTED Final   Streptococcus agalactiae NOT DETECTED NOT DETECTED Final   Streptococcus pneumoniae NOT DETECTED NOT DETECTED Final   Streptococcus pyogenes NOT DETECTED NOT DETECTED Final   A.calcoaceticus-baumannii NOT DETECTED NOT DETECTED Final   Bacteroides fragilis NOT DETECTED NOT DETECTED Final   Enterobacterales DETECTED (A) NOT DETECTED Final    Comment: Enterobacterales represent a large order of gram negative bacteria, not a single organism. CRITICAL RESULT CALLED TO, READ BACK BY AND VERIFIED WITH: NATHAN BLUE@0300  07/27/21 RH C/KATIE ALLRED@0302  07/27/21 RH    Enterobacter cloacae complex NOT DETECTED NOT DETECTED Final   Escherichia coli DETECTED (A) NOT DETECTED Final    Comment: CRITICAL RESULT CALLED TO, READ BACK BY AND VERIFIED WITH: NATHAN BLUE@0300  07/27/21 RH C/KATIE ALLRED@0302  07/27/21 RH    Klebsiella aerogenes NOT DETECTED NOT DETECTED Final   Klebsiella oxytoca NOT DETECTED NOT DETECTED Final   Klebsiella pneumoniae NOT DETECTED NOT DETECTED Final   Proteus species NOT DETECTED NOT DETECTED Final    Salmonella species NOT DETECTED NOT DETECTED Final   Serratia marcescens NOT DETECTED NOT DETECTED Final   Haemophilus influenzae NOT DETECTED NOT DETECTED Final   Neisseria meningitidis NOT DETECTED NOT DETECTED Final   Pseudomonas aeruginosa NOT DETECTED NOT DETECTED Final   Stenotrophomonas maltophilia NOT DETECTED NOT DETECTED Final   Candida albicans NOT DETECTED NOT DETECTED Final   Candida auris NOT DETECTED NOT DETECTED Final   Candida glabrata NOT DETECTED NOT DETECTED Final   Candida krusei NOT DETECTED NOT DETECTED Final   Candida parapsilosis NOT DETECTED NOT DETECTED Final   Candida tropicalis NOT DETECTED NOT DETECTED Final   Cryptococcus neoformans/gattii NOT DETECTED NOT DETECTED Final   CTX-M ESBL DETECTED (A) NOT DETECTED Final    Comment: CRITICAL RESULT CALLED TO, READ BACK BY AND VERIFIED WITH: NATHAN BLUE@0300  07/27/21 RH C/KATIE ALLRED@0302  07/27/21 RH (NOTE) Extended spectrum beta-lactamase detected. Recommend a carbapenem as initial therapy.      Carbapenem resistance IMP NOT DETECTED NOT DETECTED Final   Carbapenem resistance KPC NOT DETECTED NOT DETECTED Final   Carbapenem resistance NDM NOT DETECTED NOT DETECTED Final   Carbapenem resist OXA 48 LIKE NOT DETECTED NOT DETECTED Final   Carbapenem resistance VIM NOT DETECTED NOT DETECTED Final    Comment: Performed at [facility], [address], [place], [place] [address]         Radiology Studies: CT HEAD WO CONTRAST  Result Date: 07/26/2021 CLINICAL DATA:  Neuro deficit, acute stroke suspected. Nausea, vomiting and facial droop for a while. EXAM: CT HEAD WITHOUT CONTRAST TECHNIQUE: Contiguous axial images were obtained from the base of the skull through the vertex without intravenous contrast. COMPARISON:  CT head 04/14/2019. FINDINGS: Brain: There is no evidence of acute intracranial hemorrhage, mass lesion, brain edema or extra-axial fluid collection. The ventricles and subarachnoid spaces  are appropriately sized for age. There is no CT evidence of acute cortical infarction. Vascular:  No hyperdense vessel identified. Skull: Negative for fracture or focal lesion. Sinuses/Orbits: The visualized paranasal sinuses and mastoid air cells are clear. No orbital abnormalities are seen. Other: The previously demonstrated large left scalp hematoma has resolved. IMPRESSION: No acute intracranial or calvarial findings. No CT evidence of acute stroke.  Resolution of previously demonstrated large left scalp hematoma. Electronically Signed   By: Richardean Sale M.D.   On: 07/26/2021 14:49   MR BRAIN WO CONTRAST  Result Date: 07/26/2021 CLINICAL DATA:  Neuro deficit, acute, stroke suspected. Generalized weakness with recent fall. EXAM: MRI HEAD WITHOUT CONTRAST TECHNIQUE: Multiplanar, multiecho pulse sequences of the brain and surrounding structures were obtained without intravenous contrast. COMPARISON:  Head CT same day FINDINGS: Brain: Diffusion imaging does not show any acute or subacute infarction. The brainstem and cerebellum are normal. Cerebral hemispheres show minimal small vessel change of the white matter, less than often seen at this age. No cortical or large vessel territory infarction. No mass lesion, hemorrhage, hydrocephalus or extra-axial collection. Vascular: Major vessels at the base of the brain show flow. Skull and upper cervical spine: Negative Sinuses/Orbits: Clear/normal Other: None IMPRESSION: No acute finding. Mild chronic small-vessel ischemic change of the cerebral hemispheric white matter, less than often seen at this age. Electronically Signed   By: Nelson Chimes M.D.   On: 07/26/2021 20:22   US Abdomen Complete  Result Date: 07/27/2021 CLINICAL DATA:  Transaminitis EXAM: ABDOMEN ULTRASOUND COMPLETE COMPARISON:  CT AP 07/26/2021 FINDINGS: Gallbladder: Mild gallbladder wall thickening measures 3.5 mm. No pericholecystic fluid. No gallstones. Negative sonographic Murphy's sign.  Common bile duct: Diameter: 4.2 mm Liver: No focal liver abnormality. Liver is enlarged. Normal parenchymal echogenicity. Portal vein is patent on color Doppler imaging with normal direction of blood flow towards the liver. IVC: No abnormality visualized. Pancreas: Visualized portion unremarkable. Spleen: Size and appearance within normal limits. Right Kidney: Length: 14.1. Normal parenchymal echogenicity. Numerous cysts measuring up to 1.2 cm. No hydronephrosis. Left Kidney: Length: 14.0 cm. Normal parenchymal echogenicity. Multiple cysts measure up to 0.7 x 0.6 x 0.8 cm. No hydronephrosis. Abdominal aorta: No aneurysm visualized. Aortic atherosclerotic calcifications Other findings: None. IMPRESSION: 1. Mild gallbladder wall thickening measuring 3.5 mm. No secondary signs of acute cholecystitis. Specifically, there is no pericholecystic fluid, gallstones or positive sonographic Murphy's sign. 2. Hepatomegaly.  No focal liver abnormality. 3. Numerous bilateral kidney cysts. 4.  Aortic Atherosclerosis (ICD10-I70.0). Electronically Signed   By: Kerby Moors M.D.   On: 07/27/2021 09:28   CT ABDOMEN PELVIS W CONTRAST  Result Date: 07/26/2021 CLINICAL DATA:  Abdominal pain and fever. EXAM: CT ABDOMEN AND PELVIS WITH CONTRAST TECHNIQUE: Multidetector CT imaging of the abdomen and pelvis was performed using the standard protocol following bolus administration of intravenous contrast. CONTRAST:  47mL OMNIPAQUE IOHEXOL 350 MG/ML SOLN COMPARISON:  None. FINDINGS: Lower chest: Streaky basilar scarring changes or atelectasis. No pleural effusions or pulmonary lesions. The heart is normal in size. Aortic and coronary artery calcifications are noted. Hepatobiliary: No hepatic lesions or intrahepatic biliary dilatation. The gallbladder is unremarkable. No common bile duct dilatation. Pancreas: No mass, inflammation or ductal dilatation. Spleen: Normal size.  No focal lesions. Adrenals/Urinary Tract: Small bilateral adrenal  gland nodules likely small adenomas. Innumerable small renal cysts are noted bilaterally. No worrisome renal lesions. There is mild left-sided hydronephrosis and hydroureter but no obstructing ureteral calculi are identified. Possible recently passed calculus. Bladder is unremarkable. Stomach/Bowel: The stomach, duodenum, small bowel and colon are grossly normal. Vascular/Lymphatic: Moderate atherosclerotic calcifications involving the aorta and iliac arteries and branch vessel ostia. No aneurysm or dissection. The major venous structures are patent. Small scattered mesenteric and retroperitoneal lymph nodes but no mass or overt adenopathy. Reproductive: The uterus and ovaries are unremarkable. Other: No pelvic mass or adenopathy. No free pelvic fluid  collections. No inguinal mass or adenopathy. No abdominal wall hernia or subcutaneous lesions. Musculoskeletal: No significant bony findings. IMPRESSION: 1. No acute abdominal/pelvic findings, mass lesions or adenopathy. 2. Innumerable small renal cysts. 3. Small bilateral adrenal gland nodules likely small adenomas. 4. Mild left-sided hydroureteronephrosis without obstructing ureteral calculus. Possible recently passed calculus. 5. Moderate atherosclerotic calcifications involving the aorta and iliac arteries. Aortic Atherosclerosis (ICD10-I70.0). Electronically Signed   By: Marijo Sanes M.D.   On: 07/26/2021 17:37   DG Chest Portable 1 View  Result Date: 07/26/2021 CLINICAL DATA:  Chest pain and facial droop EXAM: PORTABLE CHEST 1 VIEW COMPARISON:  04/09/2019 FINDINGS: Cardiac shadow is within normal limits. Lungs are well aerated bilaterally. No focal infiltrate or sizable effusion is seen. No bony abnormality is noted. IMPRESSION: No active disease. Electronically Signed   By: Inez Catalina M.D.   On: 07/26/2021 17:58        Scheduled Meds:  vitamin C  1,000 mg Oral Daily   aspirin EC  81 mg Oral Daily   atorvastatin  40 mg Oral Daily   docusate  sodium  100 mg Oral Daily   doxazosin  8 mg Oral BID   levothyroxine  125 mcg Oral QAC breakfast   lithium carbonate  600 mg Oral QHS   omega-3 acid ethyl esters  3,000 mg Oral Daily   OXcarbazepine  300 mg Oral Daily   And   OXcarbazepine  600 mg Oral QHS   vitamin B-12  1,000 mcg Oral Daily   vitamin E  1,000 Units Oral Daily   ziprasidone  40 mg Oral QHS   Continuous Infusions:  sodium chloride 125 mL/hr at 07/27/21 0018   meropenem (MERREM) IV Stopped (07/27/21 1313)     LOS: 0 days    Time spent: 25 minutes    Edwin Dada, MD [facility] 07/27/2021, 4:16 PM     Please page though [place] or Epic secure chat:  For [employer], Adult nurse

## 2021-07-27 NOTE — Telephone Encounter (Signed)
Patient calling in and is currently at [facility] emergency room to be treated. She has canceled her appointment with Dr Silvio Pate for 07/31/21.   For your information

## 2021-07-28 DIAGNOSIS — B9620 Unspecified Escherichia coli [E. coli] as the cause of diseases classified elsewhere: Secondary | ICD-10-CM

## 2021-07-28 DIAGNOSIS — R7881 Bacteremia: Secondary | ICD-10-CM

## 2021-07-28 DIAGNOSIS — Z1612 Extended spectrum beta lactamase (ESBL) resistance: Secondary | ICD-10-CM

## 2021-07-28 DIAGNOSIS — B9629 Other Escherichia coli [E. coli] as the cause of diseases classified elsewhere: Secondary | ICD-10-CM

## 2021-07-28 DIAGNOSIS — N390 Urinary tract infection, site not specified: Secondary | ICD-10-CM

## 2021-07-28 DIAGNOSIS — N3000 Acute cystitis without hematuria: Secondary | ICD-10-CM | POA: Diagnosis not present

## 2021-07-28 LAB — BASIC METABOLIC PANEL
Anion gap: 9 (ref 5–15)
BUN: 25 mg/dL — ABNORMAL HIGH (ref 8–23)
CO2: 18 mmol/L — ABNORMAL LOW (ref 22–32)
Calcium: 8.2 mg/dL — ABNORMAL LOW (ref 8.9–10.3)
Chloride: 107 mmol/L (ref 98–111)
Creatinine, Ser: 1.08 mg/dL — ABNORMAL HIGH (ref 0.44–1.00)
GFR, Estimated: 55 mL/min — ABNORMAL LOW (ref >60)
Glucose, Bld: 114 mg/dL — ABNORMAL HIGH (ref 70–99)
Potassium: 3.7 mmol/L (ref 3.5–5.1)
Sodium: 134 mmol/L — ABNORMAL LOW (ref 135–145)

## 2021-07-28 LAB — CBC
HCT: 28.9 % — ABNORMAL LOW (ref 36.0–46.0)
Hemoglobin: 9.6 g/dL — ABNORMAL LOW (ref 12.0–15.0)
MCH: 28.7 pg (ref 26.0–34.0)
MCHC: 33.2 g/dL (ref 30.0–36.0)
MCV: 86.5 fL (ref 80.0–100.0)
Platelets: 114 10*3/uL — ABNORMAL LOW (ref 150–400)
RBC: 3.34 MIL/uL — ABNORMAL LOW (ref 3.87–5.11)
RDW: 15.3 % (ref 11.5–15.5)
WBC: 22.5 10*3/uL — ABNORMAL HIGH (ref 4.0–10.5)
nRBC: 0 % (ref 0.0–0.2)

## 2021-07-28 LAB — HEPATIC FUNCTION PANEL
ALT: 261 U/L — ABNORMAL HIGH (ref 0–44)
AST: 202 U/L — ABNORMAL HIGH (ref 15–41)
Albumin: 2.5 g/dL — ABNORMAL LOW (ref 3.5–5.0)
Alkaline Phosphatase: 166 U/L — ABNORMAL HIGH (ref 38–126)
Bilirubin, Direct: 0.2 mg/dL (ref 0.0–0.2)
Indirect Bilirubin: 0.5 mg/dL (ref 0.3–0.9)
Total Bilirubin: 0.7 mg/dL (ref 0.3–1.2)
Total Protein: 5.8 g/dL — ABNORMAL LOW (ref 6.5–8.1)

## 2021-07-28 LAB — LEGIONELLA PNEUMOPHILA SEROGP 1 UR AG: L. pneumophila Serogp 1 Ur Ag: NEGATIVE

## 2021-07-28 MED ORDER — SODIUM CHLORIDE 0.9 % IV SOLN
INTRAVENOUS | Status: DC | PRN
  Administered 2021-07-28: 250 mL via INTRAVENOUS

## 2021-07-28 MED ORDER — ACETAMINOPHEN 325 MG PO TABS
650.0000 mg | ORAL_TABLET | Freq: Four times a day (QID) | ORAL | Status: DC | PRN
  Administered 2021-07-28: 650 mg via ORAL
  Filled 2021-07-28: qty 2

## 2021-07-28 MED ORDER — OXYCODONE HCL 5 MG PO TABS
5.0000 mg | ORAL_TABLET | Freq: Four times a day (QID) | ORAL | Status: DC | PRN
Start: 2021-07-28 — End: 2021-08-04

## 2021-07-28 NOTE — Evaluation (Signed)
Physical Therapy Evaluation Patient Details Name: Amy Mcconnell MRN: [id number] DOB: [birth year] Today's Date: 07/28/2021  History of Present Illness  Amy Mcconnell is a 70 y.o. female with medical history significant for hyperlipidemia, hypothyroid, history of bipolar, history of schizophrenia, anxiety/depression, hyperlipidemia, hypertension, history of smoking, presents emergency department for chief concerns of nausea and vomiting.    Clinical Impression  Pt received in Semi-Fowler's position and agreeable to therapy.  Pt able to perform tasks in room with slight weakness in the LE's, however performs bed mobility well with increased time.  Pt also able to ambulate within the room due to precautions with IV pole well and slightly more difficulty with no AD.  Pt discussed HHPT for home service due to lack of assistance at home.  Pt agreed and recommended services listed below.  Pt will benefit from skilled PT intervention to increase independence and safety with basic mobility in preparation for discharge to the venue listed below.           Recommendations for follow up therapy are one component of a multi-disciplinary discharge planning process, led by the attending physician.  Recommendations may be updated based on patient status, additional functional criteria and insurance authorization.  Follow Up Recommendations Home health PT    Equipment Recommendations  None recommended by PT    Recommendations for Other Services       Precautions / Restrictions Precautions Precautions: None Restrictions Weight Bearing Restrictions: No      Mobility  Bed Mobility Overal bed mobility: Modified Independent                  Transfers Overall transfer level: Modified independent                  Ambulation/Gait Ambulation/Gait assistance: Min guard Gait Distance (Feet): 100 Feet Assistive device: IV Pole;None Gait Pattern/deviations: WFL(Within Functional  Limits)     General Gait Details: Pt ambulated well within the room and is able to navigate the room with grip on IV pole and no AD as well.  Stairs            Wheelchair Mobility    Modified Rankin (Stroke Patients Only)       Balance Overall balance assessment: Modified Independent                                           Pertinent Vitals/Pain      Home Living                        Prior Function                 Hand Dominance        Extremity/Trunk Assessment                Communication      Cognition                                              General Comments      Exercises Other Exercises Other Exercises: Pt educated on role of PT and services provided during hospital stay.   Assessment/Plan    PT Assessment Patient needs continued PT services  PT Problem  List Decreased strength       PT Treatment Interventions Balance training    PT Goals (Current goals can be found in the Care Plan section)  Acute Rehab PT Goals Patient Stated Goal: to go home. PT Goal Formulation: With patient Time For Goal Achievement: 08/11/21 Potential to Achieve Goals: Good    Frequency Min 2X/week   Barriers to discharge Decreased caregiver support Pt has no one to assist her at home.    Co-evaluation               AM-PAC PT "6 Clicks" Mobility  Outcome Measure Help needed turning from your back to your side while in a flat bed without using bedrails?: None Help needed moving from lying on your back to sitting on the side of a flat bed without using bedrails?: None Help needed moving to and from a bed to a chair (including a wheelchair)?: None Help needed standing up from a chair using your arms (e.g., wheelchair or bedside chair)?: None Help needed to walk in hospital room?: A Little Help needed climbing 3-5 steps with a railing? : A Little 6 Click Score: 22    End of Session  Equipment Utilized During Treatment: Gait belt Activity Tolerance: Patient tolerated treatment well Patient left: in bed;with call bell/phone within reach;with bed alarm set Nurse Communication: Mobility status PT Visit Diagnosis: Unsteadiness on feet (R26.81)    Time: 6122-4497 PT Time Calculation (min) (ACUTE ONLY): 31 min   Charges:   PT Evaluation $PT Eval Low Complexity: 1 Low PT Treatments $Gait Training: 23-37 mins        Gwenlyn Saran, PT, DPT 07/28/21, 4:16 PM   [name] 07/28/2021, 4:14 PM

## 2021-07-28 NOTE — TOC Initial Note (Addendum)
Transition of Care [facility]) - Initial/Assessment Note    Patient Details  Name: Amy Mcconnell MRN: [id number] Date of Birth: [birth year]  [facility] CM/SW Contact:    Magnus Ivan, LCSW Phone Number: 07/28/2021, 3:26 PM  Clinical Narrative:             Spoke with patient via phone due to isolation. Completed high risk assessment. Patient lives alone and drives self to appointments. PCP is Dr. Caryl Bis. Pharmacy is [facility]. Patient has access to a RW, cane, shower chair, BSC, an w/c if needed, but at baseline does not use any DME. No HH or SNF history. TOC to follow for discharge planning needs. Patient denies needs.   Expected Discharge Plan: Home/Self Care Barriers to Discharge: Continued Medical Work up   Patient Goals and CMS Choice Patient states their goals for this hospitalization and ongoing recovery are:: to return home CMS Medicare.gov Compare Post Acute Care list provided to:: Patient Choice offered to / list presented to : Patient  Expected Discharge Plan and Services Expected Discharge Plan: Home/Self Care       Living arrangements for the past 2 months: Single Family Home                                      Prior Living Arrangements/Services Living arrangements for the past 2 months: Single Family Home Lives with:: Self Patient language and need for interpreter reviewed:: Yes Do you feel safe going back to the place where you live?: Yes      Need for Family Participation in Patient Care: Yes (Comment) Care giver support system in place?: Yes (comment) Current home services: DME Criminal Activity/Legal Involvement Pertinent to Current Situation/Hospitalization: No - Comment as needed  Activities of Daily Living Home Assistive Devices/Equipment: Bedside commode/3-in-1, Cane (specify quad or straight), Eyeglasses, Walker (specify type), Shower chair with back ADL Screening (condition at time of admission) Patient's  cognitive ability adequate to safely complete daily activities?: Yes Is the patient deaf or have difficulty hearing?: No Does the patient have difficulty seeing, even when wearing glasses/contacts?: No Does the patient have difficulty concentrating, remembering, or making decisions?: No Patient able to express need for assistance with ADLs?: Yes Does the patient have difficulty dressing or bathing?: No Independently performs ADLs?: Yes (appropriate for developmental age) Does the patient have difficulty walking or climbing stairs?: No Weakness of Legs: None Weakness of Arms/Hands: None  Permission Sought/Granted Permission sought to share information with : Facility Art therapist granted to share information with : Yes, Verbal Permission Granted     Permission granted to share info w AGENCY: [place], DME if needed        Emotional Assessment       Orientation: : Oriented to Self, Oriented to Place, Oriented to  Time, Oriented to Situation Alcohol / Substance Use: Not Applicable Psych Involvement: No (comment)  Admission diagnosis:  UTI (urinary tract infection) [N39.0] Transaminitis [R74.01] AKI (acute kidney injury) ([place]) [N17.9] Sepsis ([place]) [A41.9] Urinary tract infection without hematuria, site unspecified [N39.0] Patient Active Problem List   Diagnosis Date Noted   Sepsis ([place]) 07/27/2021   UTI (urinary tract infection) 07/26/2021   Cough 06/16/2021   Cuboid fracture 03/15/2021   Hematuria 12/05/2020   Elevated LFTs 08/07/2019   Neck pain 05/06/2019   Scalp laceration 04/13/2019   Hypothyroidism 02/28/2019   Paroxysmal tachycardia ([place]) 10/14/2018  Stress incontinence 04/01/2016   Bradycardia 08/19/2015   Bipolar disorder ([place]) 03/30/2015   Anxiety    Essential hypertension 10/06/2014   Left foot pain 08/20/2014   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 [place] [address] Phone: [phone] Fax: [phone]     Social Determinants of Health (SDOH) Interventions    Readmission Risk Interventions Readmission Risk Prevention Plan 07/28/2021  Transportation Screening Complete  PCP or Specialist Appt within 5-7 Days Complete  Home Care Screening Complete  Medication Review (RN CM) Complete  Some recent data might be hidden

## 2021-07-28 NOTE — Consult Note (Signed)
NAME: Amy Mcconnell  DOB: 05/30/1951  MRN: [id number]  Date/Time: 07/28/2021 2:45 PM  REQUESTING PROVIDER: Dr.Danford Subjective:  REASON FOR CONSULT: ESBL e.coli bacteremia ? Amy Mcconnell is a 70 y.o. female with a history of, HTN, bipolar disorder Presents to the ED with  diarrhea Patient is not a reliable historian.  She is not sure of the chronological order Patient lives on her own She says last week she was in the yard feeding ceased with the bird when she lost balance and fell on her face she got up and then was trying to feed the birds when she fell on her back and she could not get up from that position and had to drag herself to the garage and then stand up with the help of a bench.  She says on 07/21/2021 she ate some eggs which were expiring on 07/22/2021.  She had diarrhea for couple of days and took Pepto-Bismol and called PCP.  But then her brother came to visit her as he knew that she had fallen and brought her to the ED on 07/26/2021.Marland Kitchen Patient also frequently medicates herself with Benadryl, Allegra for multiple allergies which she says manifest like swelling of her legs.  This makes her mouth very dry and her speech slurred and along with her bipolar medication she does get some dystonic movements of her face.  She also says that Geodon can sometimes cause facial deviation as well as blurred vision.  She has been diagnosed with bipolar almost 25 years ago and has been on lithium and Geodon and Trileptal. She has history of hematuria.  And had an cystoscopy done on 04/28/2021 with normal bladder. She works as a Psychologist, occupational at [employer] and she last was at work on last Wednesday. In the ED vitals were 99/63, temperature 98, heart rate 83, sats 93%. Labs revealed WBC of 19.9, Hb 11.5, platelet 98 and creatinine 1.13. Blood cultures were sent and check UA showed 21-50 WBC.  Urine culture was sent.  Had a CT abdomen and pelvis which showed mild left-sided hydroureteronephrosis without  obstructing ureteral calculus.  There was a question whether she had recently passed calculus. She was started on IV ceftriaxone. Blood culture came back as ESBL E. coli and has been switched to meropenem.  I been asked to see the patient for the same.   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 Thurmond Butts - every 3 months   Broken foot    left    Contact dermatitis and other eczema due to other specified agent    Contusion of unspecified part of lower limb    Essential hypertension, benign    Heart murmur    Other and unspecified hyperlipidemia    Other specified disorders of thyroid    Reflux esophagitis    Tachycardia    a. isolated episode, seen in ED with negative work up    Past Surgical History:  Procedure Laterality Date   CATARACT EXTRACTION     SALPINGECTOMY Left    STERILIZATION     Her decision since dz of bipolar    Social History   Socioeconomic History   Marital status: Divorced    Spouse name: Not on file   Number of children: 0   Years of education: 14   Highest education level: Not on file  Occupational History   Not on file  Tobacco Use   Smoking status: Former    Packs/day: 0.50    Years: 13.00    Pack years: 6.50    Types: Cigarettes    Quit date: 01/23/2000    Years since quitting: 21.5   Smokeless tobacco: Never  Vaping Use   Vaping Use: Never used  Substance and Sexual Activity   Alcohol use: No   Drug use: No   Sexual activity: Never  Other Topics Concern   Not on file  Social History Narrative   Ms. Drummer grew up in [place], [place]. She lives in [place]. Ms. Blankenhorn is divorced. She has 2 cats (Charlie and Angie). 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 She also volunteers 2 days at [facility] She enjoys reading, garding and cooking.   Social Determinants of Health   Financial Resource Strain: Low Risk    Difficulty of Paying Living  Expenses: Not hard at all  Food Insecurity: No Food Insecurity   Worried About Charity fundraiser in the Last Year: Never true   [place] in the Last Year: Never true  Transportation Needs: No Transportation Needs   Lack of Transportation (Medical): No   Lack of Transportation (Non-Medical): No  Physical Activity: Sufficiently Active   Days of Exercise per Week: 5 days   Minutes of Exercise per Session: 30 min  Stress: No Stress Concern Present   Feeling of Stress : Not at all  Social Connections: Moderately Isolated   Frequency of Communication with Friends and Family: More than three times a week   Frequency of Social Gatherings with Friends and Family: Once a week   Attends Religious Services: Never   Marine scientist or Organizations: Yes   Attends Archivist Meetings: Never   Marital Status: Divorced  Human resources officer Violence: Not At Risk   Fear of Current or Ex-Partner: No   Emotionally Abused: No   Physically Abused: No   Sexually Abused: No    Family History  Problem Relation Age of Onset   Arrhythmia Mother        A-Fib   Heart failure Mother    Hyperlipidemia Mother    Hypertension Mother    Alzheimer's disease Mother    Hypertension Father    Hyperlipidemia Father    Mental retardation Father 70       Suicide   Hypertension Brother    Hyperlipidemia Brother    Hypertension Brother    Hyperlipidemia Brother    Hyperlipidemia Brother    Hypertension Brother    Alcohol abuse Brother    Depression Brother    Hyperlipidemia Brother    Hypertension Brother    Breast cancer Maternal Aunt    Allergies  Allergen Reactions   Ace Inhibitors     Angioedema    Angiotensin Receptor Blockers     Angioedema    Benicar [Olmesartan]     Mouth & Lip Swelling, "I swell from the waist down". Throat swelling   Poison Ivy Extract [Poison Ivy Extract]     Blisters   Purell Instant Hand [Alcohol] Swelling   Triclosan    I? Current  Facility-Administered Medications  Medication Dose Route Frequency Provider Last Rate Last Admin   ascorbic acid (VITAMIN C) tablet 1,000 mg  1,000 mg Oral Daily Cox, Amy N, DO   1,000 mg at 07/28/21 0843   aspirin EC tablet 81 mg  81 mg Oral Daily Cox, Amy N, DO   81 mg  at 07/28/21 0843   docusate sodium (COLACE) capsule 100 mg  100 mg Oral Daily Cox, Amy N, DO   100 mg at 07/27/21 1128   doxazosin (CARDURA) tablet 8 mg  8 mg Oral BID Renda Rolls, RPH   8 mg at 07/28/21 9937   levothyroxine (SYNTHROID) tablet 125 mcg  125 mcg Oral QAC breakfast Cox, Amy N, DO   125 mcg at 07/28/21 0415   lithium carbonate (LITHOBID) CR tablet 600 mg  600 mg Oral QHS Cox, Amy N, DO   600 mg at 07/27/21 2138   meropenem (MERREM) 1 g in sodium chloride 0.9 % 100 mL IVPB  1 g Intravenous Q12H Edwin Dada, MD 200 mL/hr at 07/28/21 0844 1 g at 07/28/21 0844   omega-3 acid ethyl esters (LOVAZA) capsule 3,000 mg  3,000 mg Oral Daily Cox, Amy N, DO   3,000 mg at 07/28/21 0842   ondansetron (ZOFRAN) tablet 4 mg  4 mg Oral Q6H PRN Cox, Amy N, DO       Or   ondansetron (ZOFRAN) injection 4 mg  4 mg Intravenous Q6H PRN Cox, Amy N, DO       Oxcarbazepine (TRILEPTAL) tablet 300 mg  300 mg Oral Daily Renda Rolls, RPH   300 mg at 07/28/21 1696   And   Oxcarbazepine (TRILEPTAL) tablet 600 mg  600 mg Oral QHS Renda Rolls, RPH   600 mg at 07/27/21 2137   oxyCODONE (Oxy IR/ROXICODONE) immediate release tablet 5 mg  5 mg Oral Q6H PRN Danford, Suann Larry, MD       vitamin B-12 (CYANOCOBALAMIN) tablet 1,000 mcg  1,000 mcg Oral Daily Cox, Amy N, DO   1,000 mcg at 07/28/21 7893   vitamin E capsule 1,000 Units  1,000 Units Oral Daily Cox, Amy N, DO   1,000 Units at 07/28/21 8101   ziprasidone (GEODON) capsule 40 mg  40 mg Oral QHS Cox, Amy N, DO   40 mg at 07/27/21 0017     Abtx:  Anti-infectives (From admission, onward)    Start     Dose/Rate Route Frequency Ordered Stop   07/27/21 1800  cefTRIAXone  (ROCEPHIN) 2 g in sodium chloride 0.9 % 100 mL IVPB  Status:  Discontinued        2 g 200 mL/hr over 30 Minutes Intravenous Every 24 hours 07/26/21 1804 07/27/21 0722   07/27/21 0730  meropenem (MERREM) 1 g in sodium chloride 0.9 % 100 mL IVPB        1 g 200 mL/hr over 30 Minutes Intravenous Every 12 hours 07/27/21 0722     07/26/21 1745  cefTRIAXone (ROCEPHIN) 1 g in sodium chloride 0.9 % 100 mL IVPB        1 g 200 mL/hr over 30 Minutes Intravenous  Once 07/26/21 1741 07/26/21 1807       REVIEW OF SYSTEMS:  Const: negative fever, negative chills, negative weight loss Eyes +diplopia or visual changes, negative eye pain ENT: negative coryza, negative sore throat Resp: negative cough, hemoptysis, dyspnea Cards: negative for chest pain, palpitations, lower extremity edema GU:  frequency, dysuria and hematuria GI: Negative for abdominal pain, ++diarrhea, bleeding, constipation Skin: Bruising over her knees Heme: negative for easy bruising and gum/nose bleeding MS: Generalized weakness  Neurolo: fall buNo loss of consciousness Psych: negative for feelings of anxiety, depression  Endocrine: negative for thyroid, diabetes Allergy/Immunology-multiple allergies o Objective:  VITALS:  BP 103/68 (BP Location: Left Arm)  Pulse 76   Temp 97.9 F (36.6 C) (Oral)   Resp 18   Ht 5\' 7"  (1.702 m)   Wt 63.1 kg   SpO2 97%   BMI 21.79 kg/m  PHYSICAL EXAM:  General: Alert, cooperative, no distress, appears stated age.  Speech slurred and dysarthric Dystonic movements of her face and mouth Head: Normocephalic, without obvious abnormality, atraumatic. Eyes: Conjunctivae clear, anicteric sclerae. Pupils are equal ENT Nares normal. No drainage or sinus tenderness. Tongue coated and dry Neck: Supple, symmetrical, no adenopathy, thyroid: non tender no carotid bruit and no JVD. Back: No CVA tenderness. Lungs: Clear to auscultation bilaterally. No Wheezing or Rhonchi. No rales. Heart: Regular  rate and rhythm, no murmur, rub or gallop. Abdomen: Soft, non-tender,not distended. Bowel sounds normal. No masses Extremities: atraumatic, no cyanosis. No edema. No clubbing Skin: No rashes or lesions. Or bruising Lymph: Cervical, supraclavicular normal. Neurologic: Dystonic movements of the mouth and forehead pertinent Labs Lab Results CBC    Component Value Date/Time   WBC 22.5 (H) 07/28/2021 0526   RBC 3.34 (L) 07/28/2021 0526   HGB 9.6 (L) 07/28/2021 0526   HGB 13.1 01/17/2014 0842   HCT 28.9 (L) 07/28/2021 0526   HCT 39.9 01/17/2014 0842   PLT 114 (L) 07/28/2021 0526   PLT 141 (L) 01/17/2014 0842   MCV 86.5 07/28/2021 0526   MCV 86 01/17/2014 0842   MCH 28.7 07/28/2021 0526   MCHC 33.2 07/28/2021 0526   RDW 15.3 07/28/2021 0526   RDW 14.1 01/17/2014 0842   LYMPHSABS 1.0 07/27/2021 0724   MONOABS 1.7 (H) 07/27/2021 0724   EOSABS 0.1 07/27/2021 0724   BASOSABS 0.0 07/27/2021 0724    CMP Latest Ref Rng & Units 07/28/2021 07/27/2021 07/26/2021  Glucose 70 - 99 mg/dL 114(H) 126(H) 132(H)  BUN 8 - 23 mg/dL 25(H) 29(H) 34(H)  Creatinine 0.44 - 1.00 mg/dL 1.08(H) 1.06(H) 1.13(H)  Sodium 135 - 145 mmol/L 134(L) 130(L) 126(L)  Potassium 3.5 - 5.1 mmol/L 3.7 4.1 5.0  Chloride 98 - 111 mmol/L 107 102 94(L)  CO2 22 - 32 mmol/L 18(L) 18(L) 21(L)  Calcium 8.9 - 10.3 mg/dL 8.2(L) 8.7(L) 9.4  Total Protein 6.5 - 8.1 g/dL 5.8(L) - 7.3  Total Bilirubin 0.3 - 1.2 mg/dL 0.7 - 0.8  Alkaline Phos 38 - 126 U/L 166(H) - 140(H)  AST 15 - 41 U/L 202(H) - 128(H)  ALT 0 - 44 U/L 261(H) - 172(H)      Microbiology: Recent Results (from the past 240 hour(s))  Resp Panel by RT-PCR (Flu A&B, Covid) Nasopharyngeal Swab     Status: None   Collection Time: 07/26/21  4:15 PM   Specimen: Nasopharyngeal Swab; Nasopharyngeal(NP) swabs in vial transport medium  Result Value Ref Range Status   SARS Coronavirus 2 by RT PCR NEGATIVE NEGATIVE Final    Comment: (NOTE) SARS-CoV-2 target nucleic acids are  NOT DETECTED.  The SARS-CoV-2 RNA is generally detectable in upper respiratory specimens during the acute phase of infection. The lowest concentration of SARS-CoV-2 viral copies this assay can detect is 138 copies/mL. A negative result does not preclude SARS-Cov-2 infection and should not be used as the sole basis for treatment or other patient management decisions. A negative result may occur with  improper specimen collection/handling, submission of specimen other than nasopharyngeal swab, presence of viral mutation(s) within the areas targeted by this assay, and inadequate number of viral copies(<138 copies/mL). A negative result must be combined with clinical observations, patient history, and epidemiological  information. The expected result is Negative.  Fact Sheet for Patients:  EntrepreneurPulse.com.au  Fact Sheet for Healthcare Providers:  IncredibleEmployment.be  This test is no t yet approved or cleared by the [place] FDA and  has been authorized for detection and/or diagnosis of SARS-CoV-2 by FDA under an Emergency Use Authorization (EUA). This EUA will remain  in effect (meaning this test can be used) for the duration of the COVID-19 declaration under Section 564(b)(1) of the Act, 21 U.S.C.section 360bbb-3(b)(1), unless the authorization is terminated  or revoked sooner.       Influenza A by PCR NEGATIVE NEGATIVE Final   Influenza B by PCR NEGATIVE NEGATIVE Final    Comment: (NOTE) The Xpert Xpress SARS-CoV-2/FLU/RSV plus assay is intended as an aid in the diagnosis of influenza from Nasopharyngeal swab specimens and should not be used as a sole basis for treatment. Nasal washings and aspirates are unacceptable for Xpert Xpress SARS-CoV-2/FLU/RSV testing.  Fact Sheet for Patients: EntrepreneurPulse.com.au  Fact Sheet for Healthcare Providers: IncredibleEmployment.be  This test is not  yet approved or cleared by the [place] FDA and has been authorized for detection and/or diagnosis of SARS-CoV-2 by FDA under an Emergency Use Authorization (EUA). This EUA will remain in effect (meaning this test can be used) for the duration of the COVID-19 declaration under Section 564(b)(1) of the Act, 21 U.S.C. section 360bbb-3(b)(1), unless the authorization is terminated or revoked.  Performed at [facility], [address] [place], [place] [address]   Urine Culture     Status: Abnormal (Preliminary result)   Collection Time: 07/26/21  5:00 PM   Specimen: Urine, Random  Result Value Ref Range Status   Specimen Description   Final    URINE, RANDOM Performed at [facility], [address], [place], [place] [address]    Special Requests   Final    NONE Performed at [facility], [address] [place], [place] [address]    Culture (A)  Final    >=100,000 COLONIES/mL ESCHERICHIA COLI SUSCEPTIBILITIES TO FOLLOW Performed at [facility], [place] [address] [place], [place] [address]    Report Status PENDING  Incomplete  Blood culture (routine x 2)     Status: None (Preliminary result)   Collection Time: 07/26/21  5:50 PM   Specimen: BLOOD  Result Value Ref Range Status   Specimen Description   Final    BLOOD RIGHT ANTECUBITAL Performed at [facility], [address], [place], [place] [address]    Special Requests   Final    BOTTLES DRAWN AEROBIC AND ANAEROBIC Blood Culture results may not be optimal due to an inadequate volume of blood received in culture bottles Performed at [facility], [address], [place], [place] [address]    Culture  Setup Time   Final    GRAM NEGATIVE RODS IN BOTH AEROBIC AND ANAEROBIC BOTTLES CRITICAL VALUE NOTED.  VALUE IS CONSISTENT WITH PREVIOUSLY REPORTED AND CALLED VALUE. Performed at [facility], [address] [place], [place] [address]    Culture   Final     Lonell Grandchild NEGATIVE RODS IDENTIFICATION TO FOLLOW Performed at [facility], [place] [address] [place],  [address]    Report Status PENDING  Incomplete  Blood culture (routine x 2)     Status: Abnormal (Preliminary result)   Collection Time: 07/26/21  5:50 PM   Specimen: BLOOD  Result Value Ref Range Status   Specimen Description  Final    BLOOD BLOOD RIGHT FOREARM Performed at [facility], [address] [place], [place] [address]    Special Requests   Final    BOTTLES DRAWN AEROBIC AND ANAEROBIC Blood Culture results may not be optimal due to an inadequate volume of blood received in culture bottles Performed at [facility], [address] [place], [place] [address]    Culture  Setup Time   Final    GRAM NEGATIVE RODS IN BOTH AEROBIC AND ANAEROBIC BOTTLES Organism ID to follow CRITICAL RESULT CALLED TO, READ BACK BY AND VERIFIED WITH: NATHAN BLUE@0300  07/27/21 RH Performed at [facility], [address], [place], [place] [address]    Culture (A)  Final    ESCHERICHIA COLI SUSCEPTIBILITIES TO FOLLOW Performed at [facility], [place] [address], [place], [place] [address]    Report Status PENDING  Incomplete  Blood Culture ID Panel (Reflexed)     Status: Abnormal   Collection Time: 07/26/21  5:50 PM  Result Value Ref Range Status   Enterococcus faecalis NOT DETECTED NOT DETECTED Final   Enterococcus Faecium NOT DETECTED NOT DETECTED Final   Listeria monocytogenes NOT DETECTED NOT DETECTED Final   Staphylococcus species NOT DETECTED NOT DETECTED Final   Staphylococcus aureus (BCID) NOT DETECTED NOT DETECTED Final   Staphylococcus epidermidis NOT DETECTED NOT DETECTED Final   Staphylococcus lugdunensis NOT DETECTED NOT DETECTED Final   Streptococcus species NOT DETECTED NOT DETECTED Final   Streptococcus agalactiae NOT DETECTED NOT DETECTED Final   Streptococcus pneumoniae NOT DETECTED NOT DETECTED Final   Streptococcus pyogenes NOT  DETECTED NOT DETECTED Final   A.calcoaceticus-baumannii NOT DETECTED NOT DETECTED Final   Bacteroides fragilis NOT DETECTED NOT DETECTED Final   Enterobacterales DETECTED (A) NOT DETECTED Final    Comment: Enterobacterales represent a large order of gram negative bacteria, not a single organism. CRITICAL RESULT CALLED TO, READ BACK BY AND VERIFIED WITH: NATHAN BLUE@0300  07/27/21 RH C/KATIE ALLRED@0302  07/27/21 RH    Enterobacter cloacae complex NOT DETECTED NOT DETECTED Final   Escherichia coli DETECTED (A) NOT DETECTED Final    Comment: CRITICAL RESULT CALLED TO, READ BACK BY AND VERIFIED WITH: NATHAN BLUE@0300  07/27/21 RH C/KATIE ALLRED@0302  07/27/21 RH    Klebsiella aerogenes NOT DETECTED NOT DETECTED Final   Klebsiella oxytoca NOT DETECTED NOT DETECTED Final   Klebsiella pneumoniae NOT DETECTED NOT DETECTED Final   Proteus species NOT DETECTED NOT DETECTED Final   Salmonella species NOT DETECTED NOT DETECTED Final   Serratia marcescens NOT DETECTED NOT DETECTED Final   Haemophilus influenzae NOT DETECTED NOT DETECTED Final   Neisseria meningitidis NOT DETECTED NOT DETECTED Final   Pseudomonas aeruginosa NOT DETECTED NOT DETECTED Final   Stenotrophomonas maltophilia NOT DETECTED NOT DETECTED Final   Candida albicans NOT DETECTED NOT DETECTED Final   Candida auris NOT DETECTED NOT DETECTED Final   Candida glabrata NOT DETECTED NOT DETECTED Final   Candida krusei NOT DETECTED NOT DETECTED Final   Candida parapsilosis NOT DETECTED NOT DETECTED Final   Candida tropicalis NOT DETECTED NOT DETECTED Final   Cryptococcus neoformans/gattii NOT DETECTED NOT DETECTED Final   CTX-M ESBL DETECTED (A) NOT DETECTED Final    Comment: CRITICAL RESULT CALLED TO, READ BACK BY AND VERIFIED WITH: NATHAN BLUE@0300  07/27/21 RH C/KATIE ALLRED@0302  07/27/21 RH (NOTE) Extended spectrum beta-lactamase detected. Recommend a carbapenem as initial therapy.      Carbapenem resistance IMP NOT DETECTED NOT  DETECTED Final   Carbapenem resistance KPC NOT DETECTED  NOT DETECTED Final   Carbapenem resistance NDM NOT DETECTED NOT DETECTED Final   Carbapenem resist OXA 48 LIKE NOT DETECTED NOT DETECTED Final   Carbapenem resistance VIM NOT DETECTED NOT DETECTED Final    Comment: Performed at [facility], [address] [place], [place] [address]    IMAGING RESULTS: CT abdomen and pelvis Mild left-sided hydroureteronephrosis without obstructing ureteral calculus. Possible recently passed calculus I have personally reviewed the films  CT head N  MRI head Mild chronic small-vessel ischemic change of the cerebral hemispheric white matter, less than often seen at this age.  CXR N ? Impression/Recommendation ? ?BAtceremia due to ESBL e.coli Complicated UTI due to e.coli with mild left hydroureteonephrosis. Possible recently passed stone Patient is currently on meropenem Depending on susceptibility she may have an oral option like quinolone or Bactrim or it would have to be IV or ertapenem on discharge.  She will need antibiotics until 08/05/2021.  Dystonic movements of the  face especially around the mouth along with dysarthria.  This could be from her antipsychotic medication. Bipolar disorder on lithium, Geodon and Trileptal  Transaminitis.  Could be from the infection.  Very dry mouth from above medications and antihistamines which she had been taking at home ? _Thrombocytopenia could be from the infection.  __________________________________________________ Discussed the management with the patient in detail.  Note:  This document was prepared using Dragon voice recognition software and may include unintentional dictation errors.

## 2021-07-28 NOTE — Progress Notes (Signed)
[facility] PROGRESS NOTE    Amy Mcconnell  GUR:[id number] DOB: 1951-04-12 DOA: 07/26/2021 PCP: Leone Haven, MD      Brief Narrative:  Amy Mcconnell is a 70 y.o. F with bipolar disorder, schizophrenia, HTN, hypothyroidism who presented with nausea and vomiting.  Evidently the patient's been sick at home for several days with malaise, nausea, weakness, and vomiting as well as dysuria, polyuria and urgency.  Family she was getting worse so she came to the ER, where she was febrile, WBC 19.9K, platelets less than 100K.  Urinalysis suggsted infection.  Started on antibiotics and admitted.     Assessment & Plan:  Severe sepsis due to ESBL E. coli bacteremia, possible urinary source Patient presented with thrombocytopenia, transaminitis, and bacteremia.  Blood cultures growing ESBL in 2 of 2.  She is clinically improving - Continue meropenem, day 2 - Consult infectious disease regarding agent and duration of treatment     Facial droop Double vision Facial droop is slightly more than baseline, but patient feels this is from [place] given in the ER.  MRI brain ruled out stroke.  Double vision is resolved.  Transaminitis This is a hepatocellular pattern, there is no abdominal pain at all.  She is emphatic that she does not use excessive acetaminophen.  Liver ultrasound unremarkable.  I do not believe there is anything reversible and urgent like cholangitis or acetaminophen toxicity.  Discussed with pharmacy, other than her statin, nothing that is particularly hepatotoxic.   -Hold statin, acetaminophen - Trend LFTs  Hypothyroidism - Continue levothyroxine  Bipolar and schizophrenia - Continue Geodon, oxcarbazepine, lithium  Hypertension Blood pressure normal - Continue aspirin, atorvastatin, doxazosin - Hold amlodipine, propranolol  Hyponatremia Mild asymptomatic          Disposition: Status is: Inpatient  Remains inpatient appropriate  because:IV treatments appropriate due to intensity of illness or inability to take PO  Dispo: The patient is from: Home              Anticipated d/c is to:  TBD              Patient currently is not medically stable to d/c.   Difficult to place patient No       Level of care: Med-Surg       MDM: The below labs and imaging reports were reviewed and summarized above.  Medication management as above.    DVT prophylaxis: Place TED hose Start: 07/26/21 1803  Code Status: Full code Family Communication: Daughter at the bedside    Consultants:    Procedures:    Antimicrobials:  Ceftriaxone x1 on 9/21 Meropenem 9/22 >>   Culture data:  9/.21 Blood culture 2/2 with ESBL 9/21 urine culture pending          Subjective: No complaints, no abdominal pain, headache, dyspnea, chest pain, diarrhea, vomiting, fever, jaundice..  Objective: Vitals:   07/27/21 2011 07/28/21 0445 07/28/21 0854 07/28/21 1542  BP: (!) 103/59 124/67 103/68 122/72  Pulse: 92 82 76 84  Resp: 20 16 18 18   Temp: 98.5 F (36.9 C) 97.6 F (36.4 C) 97.9 F (36.6 C) 98.6 F (37 C)  TempSrc: Oral Oral Oral   SpO2: 94% 94% 97% 93%  Weight:      Height:        Intake/Output Summary (Last 24 hours) at 07/28/2021 1750 Last data filed at 07/28/2021 1600 Gross per 24 hour  Intake 1437.5 ml  Output 2800 ml  Net -1362.5  ml   Filed Weights   07/26/21 1328  Weight: 63.1 kg    Examination: General appearance: Elderly adult female, lying in bed, no acute distress, interactive     HEENT:    Skin: No jaundice, skin warm and dry Cardiac: RRR, no murmurs, no lower extremity edema, JVP normal Respiratory: Normal respiratory rate and rhythm, lungs clear without rales or wheezes Abdomen: Abdomen soft, no tenderness palpation or guarding at all, no ascites or distention.  No hepatosplenomegaly. MSK:  Neuro: Alert, extraocular movements intact, face with left facial droop, improved from yesterday,  speech fluent, strength coordinated in upper and lower extremities, strength normal. Psych: Sensorium intact and responding to questions, attention normal, affect appropriate, judgment appear normal     Data Reviewed: I have personally reviewed following labs and imaging studies:  CBC: Recent Labs  Lab 07/26/21 1330 07/27/21 0724 07/28/21 0526  WBC 19.9* 20.6* 22.5*  NEUTROABS 16.9* 17.7*  --   HGB 11.5* 10.2* 9.6*  HCT 35.3* 30.4* 28.9*  MCV 86.7 85.9 86.5  PLT 98* 98* 161*   Basic Metabolic Panel: Recent Labs  Lab 07/26/21 1330 07/27/21 0724 07/28/21 0526  NA 126* 130* 134*  K 5.0 4.1 3.7  CL 94* 102 107  CO2 21* 18* 18*  GLUCOSE 132* 126* 114*  BUN 34* 29* 25*  CREATININE 1.13* 1.06* 1.08*  CALCIUM 9.4 8.7* 8.2*  MG  --  2.1  --   PHOS  --  3.1  --    GFR: Estimated Creatinine Clearance: 47.1 mL/min (A) (by C-G formula based on SCr of 1.08 mg/dL (H)). Liver Function Tests: Recent Labs  Lab 07/26/21 1330 07/28/21 0526  AST 128* 202*  ALT 172* 261*  ALKPHOS 140* 166*  BILITOT 0.8 0.7  PROT 7.3 5.8*  ALBUMIN 3.4* 2.5*   Recent Labs  Lab 07/26/21 1330  LIPASE 47   No results for input(s): AMMONIA in the last 168 hours. Coagulation Profile: Recent Labs  Lab 07/26/21 2245 07/27/21 0724  INR 1.2 1.3*   Cardiac Enzymes: No results for input(s): CKTOTAL, CKMB, CKMBINDEX, TROPONINI in the last 168 hours. BNP (last 3 results) No results for input(s): PROBNP in the last 8760 hours. HbA1C: No results for input(s): HGBA1C in the last 72 hours. CBG: No results for input(s): GLUCAP in the last 168 hours. Lipid Profile: No results for input(s): CHOL, HDL, LDLCALC, TRIG, CHOLHDL, LDLDIRECT in the last 72 hours. Thyroid Function Tests: No results for input(s): TSH, T4TOTAL, FREET4, T3FREE, THYROIDAB in the last 72 hours. Anemia Panel: No results for input(s): VITAMINB12, FOLATE, FERRITIN, TIBC, IRON, RETICCTPCT in the last 72 hours. Urine analysis:     Component Value Date/Time   COLORURINE YELLOW (A) 07/26/2021 1700   APPEARANCEUR CLOUDY (A) 07/26/2021 1700   APPEARANCEUR Clear 04/28/2021 1337   LABSPEC 1.006 07/26/2021 1700   LABSPEC 1.006 01/28/2015 1549   PHURINE 6.0 07/26/2021 1700   GLUCOSEU NEGATIVE 07/26/2021 1700   GLUCOSEU NEGATIVE 12/05/2020 1250   HGBUR MODERATE (A) 07/26/2021 1700   BILIRUBINUR NEGATIVE 07/26/2021 1700   BILIRUBINUR Negative 04/28/2021 1337   BILIRUBINUR Negative 01/28/2015 1549   KETONESUR NEGATIVE 07/26/2021 1700   PROTEINUR 30 (A) 07/26/2021 1700   UROBILINOGEN 0.2 12/05/2020 1250   NITRITE NEGATIVE 07/26/2021 1700   LEUKOCYTESUR LARGE (A) 07/26/2021 1700   LEUKOCYTESUR Trace 01/28/2015 1549   Sepsis Labs: @LABRCNTIP (procalcitonin:4,lacticacidven:4)  ) Recent Results (from the past 240 hour(s))  Resp Panel by RT-PCR (Flu A&B, Covid) Nasopharyngeal Swab  Status: None   Collection Time: 07/26/21  4:15 PM   Specimen: Nasopharyngeal Swab; Nasopharyngeal(NP) swabs in vial transport medium  Result Value Ref Range Status   SARS Coronavirus 2 by RT PCR NEGATIVE NEGATIVE Final    Comment: (NOTE) SARS-CoV-2 target nucleic acids are NOT DETECTED.  The SARS-CoV-2 RNA is generally detectable in upper respiratory specimens during the acute phase of infection. The lowest concentration of SARS-CoV-2 viral copies this assay can detect is 138 copies/mL. A negative result does not preclude SARS-Cov-2 infection and should not be used as the sole basis for treatment or other patient management decisions. A negative result may occur with  improper specimen collection/handling, submission of specimen other than nasopharyngeal swab, presence of viral mutation(s) within the areas targeted by this assay, and inadequate number of viral copies(<138 copies/mL). A negative result must be combined with clinical observations, patient history, and epidemiological information. The expected result is Negative.  Fact  Sheet for Patients:  EntrepreneurPulse.com.au  Fact Sheet for Healthcare Providers:  IncredibleEmployment.be  This test is no t yet approved or cleared by the [place] FDA and  has been authorized for detection and/or diagnosis of SARS-CoV-2 by FDA under an Emergency Use Authorization (EUA). This EUA will remain  in effect (meaning this test can be used) for the duration of the COVID-19 declaration under Section 564(b)(1) of the Act, 21 U.S.C.section 360bbb-3(b)(1), unless the authorization is terminated  or revoked sooner.       Influenza A by PCR NEGATIVE NEGATIVE Final   Influenza B by PCR NEGATIVE NEGATIVE Final    Comment: (NOTE) The Xpert Xpress SARS-CoV-2/FLU/RSV plus assay is intended as an aid in the diagnosis of influenza from Nasopharyngeal swab specimens and should not be used as a sole basis for treatment. Nasal washings and aspirates are unacceptable for Xpert Xpress SARS-CoV-2/FLU/RSV testing.  Fact Sheet for Patients: EntrepreneurPulse.com.au  Fact Sheet for Healthcare Providers: IncredibleEmployment.be  This test is not yet approved or cleared by the [place] FDA and has been authorized for detection and/or diagnosis of SARS-CoV-2 by FDA under an Emergency Use Authorization (EUA). This EUA will remain in effect (meaning this test can be used) for the duration of the COVID-19 declaration under Section 564(b)(1) of the Act, 21 U.S.C. section 360bbb-3(b)(1), unless the authorization is terminated or revoked.  Performed at [facility], [address] [place], [place] [address]   Urine Culture     Status: Abnormal (Preliminary result)   Collection Time: 07/26/21  5:00 PM   Specimen: Urine, Random  Result Value Ref Range Status   Specimen Description   Final    URINE, RANDOM Performed at [facility], [address] [place], [place] [address]    Special  Requests   Final    NONE Performed at [facility], [address] [place], [place] [address]    Culture (A)  Final    >=100,000 COLONIES/mL ESCHERICHIA COLI SUSCEPTIBILITIES TO FOLLOW Performed at [facility], [place] [address], [place], [place] [address]    Report Status PENDING  Incomplete  Blood culture (routine x 2)     Status: None (Preliminary result)   Collection Time: 07/26/21  5:50 PM   Specimen: BLOOD  Result Value Ref Range Status   Specimen Description   Final    BLOOD RIGHT ANTECUBITAL Performed at [facility], [address] [place], [place] [address]    Special Requests   Final    BOTTLES DRAWN AEROBIC AND  ANAEROBIC Blood Culture results may not be optimal due to an inadequate volume of blood received in culture bottles Performed at [facility], [address] [place], [place] [address]    Culture  Setup Time   Final    GRAM NEGATIVE RODS IN BOTH AEROBIC AND ANAEROBIC BOTTLES CRITICAL VALUE NOTED.  VALUE IS CONSISTENT WITH PREVIOUSLY REPORTED AND CALLED VALUE. Performed at [facility], [address], [place], [place] [address]    Culture   Final    Lonell Grandchild NEGATIVE RODS IDENTIFICATION TO FOLLOW Performed at [facility], [place] [address], [place], [place] [address]    Report Status PENDING  Incomplete  Blood culture (routine x 2)     Status: Abnormal (Preliminary result)   Collection Time: 07/26/21  5:50 PM   Specimen: BLOOD  Result Value Ref Range Status   Specimen Description   Final    BLOOD BLOOD RIGHT FOREARM Performed at [facility], [address], [place], [place] [address]    Special Requests   Final    BOTTLES DRAWN AEROBIC AND ANAEROBIC Blood Culture results may not be optimal due to an inadequate volume of blood received in culture bottles Performed at [facility], [address], [place], [place] [address]    Culture  Setup Time   Final    GRAM NEGATIVE RODS IN BOTH  AEROBIC AND ANAEROBIC BOTTLES Organism ID to follow CRITICAL RESULT CALLED TO, READ BACK BY AND VERIFIED WITH: NATHAN BLUE@0300  07/27/21 RH Performed at [facility], [address] [place], [place] [address]    Culture (A)  Final    ESCHERICHIA COLI SUSCEPTIBILITIES TO FOLLOW Performed at [place] [address], [place], [place] [address]    Report Status PENDING  Incomplete  Blood Culture ID Panel (Reflexed)     Status: Abnormal   Collection Time: 07/26/21  5:50 PM  Result Value Ref Range Status   Enterococcus faecalis NOT DETECTED NOT DETECTED Final   Enterococcus Faecium NOT DETECTED NOT DETECTED Final   Listeria monocytogenes NOT DETECTED NOT DETECTED Final   Staphylococcus species NOT DETECTED NOT DETECTED Final   Staphylococcus aureus (BCID) NOT DETECTED NOT DETECTED Final   Staphylococcus epidermidis NOT DETECTED NOT DETECTED Final   Staphylococcus lugdunensis NOT DETECTED NOT DETECTED Final   Streptococcus species NOT DETECTED NOT DETECTED Final   Streptococcus agalactiae NOT DETECTED NOT DETECTED Final   Streptococcus pneumoniae NOT DETECTED NOT DETECTED Final   Streptococcus pyogenes NOT DETECTED NOT DETECTED Final   A.calcoaceticus-baumannii NOT DETECTED NOT DETECTED Final   Bacteroides fragilis NOT DETECTED NOT DETECTED Final   Enterobacterales DETECTED (A) NOT DETECTED Final    Comment: Enterobacterales represent a large order of gram negative bacteria, not a single organism. CRITICAL RESULT CALLED TO, READ BACK BY AND VERIFIED WITH: NATHAN BLUE@0300  07/27/21 RH C/KATIE ALLRED@0302  07/27/21 RH    Enterobacter cloacae complex NOT DETECTED NOT DETECTED Final   Escherichia coli DETECTED (A) NOT DETECTED Final    Comment: CRITICAL RESULT CALLED TO, READ BACK BY AND VERIFIED WITH: NATHAN BLUE@0300  07/27/21 RH C/KATIE ALLRED@0302  07/27/21 RH    Klebsiella aerogenes NOT DETECTED NOT DETECTED Final   Klebsiella oxytoca NOT DETECTED NOT DETECTED Final    Klebsiella pneumoniae NOT DETECTED NOT DETECTED Final   Proteus species NOT DETECTED NOT DETECTED Final   Salmonella species NOT DETECTED NOT DETECTED Final   Serratia marcescens NOT DETECTED NOT DETECTED Final   Haemophilus influenzae NOT DETECTED NOT DETECTED  Final   Neisseria meningitidis NOT DETECTED NOT DETECTED Final   Pseudomonas aeruginosa NOT DETECTED NOT DETECTED Final   Stenotrophomonas maltophilia NOT DETECTED NOT DETECTED Final   Candida albicans NOT DETECTED NOT DETECTED Final   Candida auris NOT DETECTED NOT DETECTED Final   Candida glabrata NOT DETECTED NOT DETECTED Final   Candida krusei NOT DETECTED NOT DETECTED Final   Candida parapsilosis NOT DETECTED NOT DETECTED Final   Candida tropicalis NOT DETECTED NOT DETECTED Final   Cryptococcus neoformans/gattii NOT DETECTED NOT DETECTED Final   CTX-M ESBL DETECTED (A) NOT DETECTED Final    Comment: CRITICAL RESULT CALLED TO, READ BACK BY AND VERIFIED WITH: NATHAN BLUE@0300  07/27/21 RH C/KATIE ALLRED@0302  07/27/21 RH (NOTE) Extended spectrum beta-lactamase detected. Recommend a carbapenem as initial therapy.      Carbapenem resistance IMP NOT DETECTED NOT DETECTED Final   Carbapenem resistance KPC NOT DETECTED NOT DETECTED Final   Carbapenem resistance NDM NOT DETECTED NOT DETECTED Final   Carbapenem resist OXA 48 LIKE NOT DETECTED NOT DETECTED Final   Carbapenem resistance VIM NOT DETECTED NOT DETECTED Final    Comment: Performed at [facility], [address], [place], [place] [address]         Radiology Studies: MR BRAIN WO CONTRAST  Result Date: 07/26/2021 CLINICAL DATA:  Neuro deficit, acute, stroke suspected. Generalized weakness with recent fall. EXAM: MRI HEAD WITHOUT CONTRAST TECHNIQUE: Multiplanar, multiecho pulse sequences of the brain and surrounding structures were obtained without intravenous contrast. COMPARISON:  Head CT same day FINDINGS: Brain: Diffusion imaging does not show any acute  or subacute infarction. The brainstem and cerebellum are normal. Cerebral hemispheres show minimal small vessel change of the white matter, less than often seen at this age. No cortical or large vessel territory infarction. No mass lesion, hemorrhage, hydrocephalus or extra-axial collection. Vascular: Major vessels at the base of the brain show flow. Skull and upper cervical spine: Negative Sinuses/Orbits: Clear/normal Other: None IMPRESSION: No acute finding. Mild chronic small-vessel ischemic change of the cerebral hemispheric white matter, less than often seen at this age. Electronically Signed   By: Nelson Chimes M.D.   On: 07/26/2021 20:22   US Abdomen Complete  Result Date: 07/27/2021 CLINICAL DATA:  Transaminitis EXAM: ABDOMEN ULTRASOUND COMPLETE COMPARISON:  CT AP 07/26/2021 FINDINGS: Gallbladder: Mild gallbladder wall thickening measures 3.5 mm. No pericholecystic fluid. No gallstones. Negative sonographic Murphy's sign. Common bile duct: Diameter: 4.2 mm Liver: No focal liver abnormality. Liver is enlarged. Normal parenchymal echogenicity. Portal vein is patent on color Doppler imaging with normal direction of blood flow towards the liver. IVC: No abnormality visualized. Pancreas: Visualized portion unremarkable. Spleen: Size and appearance within normal limits. Right Kidney: Length: 14.1. Normal parenchymal echogenicity. Numerous cysts measuring up to 1.2 cm. No hydronephrosis. Left Kidney: Length: 14.0 cm. Normal parenchymal echogenicity. Multiple cysts measure up to 0.7 x 0.6 x 0.8 cm. No hydronephrosis. Abdominal aorta: No aneurysm visualized. Aortic atherosclerotic calcifications Other findings: None. IMPRESSION: 1. Mild gallbladder wall thickening measuring 3.5 mm. No secondary signs of acute cholecystitis. Specifically, there is no pericholecystic fluid, gallstones or positive sonographic Murphy's sign. 2. Hepatomegaly.  No focal liver abnormality. 3. Numerous bilateral kidney cysts. 4.  Aortic  Atherosclerosis (ICD10-I70.0). Electronically Signed   By: Kerby Moors M.D.   On: 07/27/2021 09:28        Scheduled Meds:  vitamin C  1,000 mg Oral Daily   aspirin EC  81 mg Oral Daily   docusate sodium  100 mg  Oral Daily   doxazosin  8 mg Oral BID   levothyroxine  125 mcg Oral QAC breakfast   lithium carbonate  600 mg Oral QHS   omega-3 acid ethyl esters  3,000 mg Oral Daily   OXcarbazepine  300 mg Oral Daily   And   OXcarbazepine  600 mg Oral QHS   vitamin B-12  1,000 mcg Oral Daily   vitamin E  1,000 Units Oral Daily   ziprasidone  40 mg Oral QHS   Continuous Infusions:  meropenem (MERREM) IV 1 g (07/28/21 0844)     LOS: 1 day    Time spent: 25 minutes    Edwin Dada, MD [facility] 07/28/2021, 5:50 PM     Please page though [place] or Epic secure chat:  For [employer], Adult nurse

## 2021-07-29 DIAGNOSIS — N3000 Acute cystitis without hematuria: Secondary | ICD-10-CM | POA: Diagnosis not present

## 2021-07-29 LAB — URINE CULTURE: Culture: >=100000 — AB

## 2021-07-29 LAB — CBC
HCT: 34 % — ABNORMAL LOW (ref 36.0–46.0)
Hemoglobin: 11.2 g/dL — ABNORMAL LOW (ref 12.0–15.0)
MCH: 29 pg (ref 26.0–34.0)
MCHC: 32.9 g/dL (ref 30.0–36.0)
MCV: 88.1 fL (ref 80.0–100.0)
Platelets: 151 10*3/uL (ref 150–400)
RBC: 3.86 MIL/uL — ABNORMAL LOW (ref 3.87–5.11)
RDW: 15.6 % — ABNORMAL HIGH (ref 11.5–15.5)
WBC: 28.2 10*3/uL — ABNORMAL HIGH (ref 4.0–10.5)
nRBC: 0 % (ref 0.0–0.2)

## 2021-07-29 LAB — COMPREHENSIVE METABOLIC PANEL
ALT: 349 U/L — ABNORMAL HIGH (ref 0–44)
AST: 241 U/L — ABNORMAL HIGH (ref 15–41)
Albumin: 2.6 g/dL — ABNORMAL LOW (ref 3.5–5.0)
Alkaline Phosphatase: 215 U/L — ABNORMAL HIGH (ref 38–126)
Anion gap: 7 (ref 5–15)
BUN: 23 mg/dL (ref 8–23)
CO2: 22 mmol/L (ref 22–32)
Calcium: 8.7 mg/dL — ABNORMAL LOW (ref 8.9–10.3)
Chloride: 108 mmol/L (ref 98–111)
Creatinine, Ser: 0.99 mg/dL (ref 0.44–1.00)
GFR, Estimated: >60 mL/min (ref >60)
Glucose, Bld: 130 mg/dL — ABNORMAL HIGH (ref 70–99)
Potassium: 3.8 mmol/L (ref 3.5–5.1)
Sodium: 137 mmol/L (ref 135–145)
Total Bilirubin: 0.6 mg/dL (ref 0.3–1.2)
Total Protein: 6.4 g/dL — ABNORMAL LOW (ref 6.5–8.1)

## 2021-07-29 LAB — CULTURE, BLOOD (ROUTINE X 2)

## 2021-07-29 MED ORDER — MEROPENEM 1 G IV SOLR
1.0000 g | Freq: Three times a day (TID) | INTRAVENOUS | Status: AC
  Administered 2021-07-29 – 2021-08-03 (×17): 1 g via INTRAVENOUS
  Filled 2021-07-29 (×19): qty 1

## 2021-07-29 NOTE — Plan of Care (Signed)

## 2021-07-29 NOTE — Progress Notes (Signed)
ID Spoke to patient Says she is feeling bettr More energy Walked Appetitie good No diarrhea Speech better Patient Vitals for the past 24 hrs:  BP Temp Temp src Pulse Resp SpO2  07/29/21 1937 129/67 98.4 F (36.9 C) Oral 88 20 96 %  07/29/21 1551 116/79 98.3 F (36.8 C) Oral 80 20 97 %  07/29/21 0811 120/66 100.2 F (37.9 C) Oral 77 20 97 %  07/29/21 0444 132/74 97.9 F (36.6 C) Oral 75 16 98 %  07/28/21 2159 -- 98.1 F (36.7 C) -- -- -- --  07/28/21 2025 125/74 (!) 100.4 F (38 C) Oral 84 18 96 %     Labs CBC Latest Ref Rng & Units 07/29/2021 07/28/2021 07/27/2021  WBC 4.0 - 10.5 K/uL 28.2(H) 22.5(H) 20.6(H)  Hemoglobin 12.0 - 15.0 g/dL 11.2(L) 9.6(L) 10.2(L)  Hematocrit 36.0 - 46.0 % 34.0(L) 28.9(L) 30.4(L)  Platelets 150 - 400 K/uL 151 114(L) 98(L)     CMP Latest Ref Rng & Units 07/29/2021 07/28/2021 07/27/2021  Glucose 70 - 99 mg/dL 130(H) 114(H) 126(H)  BUN 8 - 23 mg/dL 23 25(H) 29(H)  Creatinine 0.44 - 1.00 mg/dL 0.99 1.08(H) 1.06(H)  Sodium 135 - 145 mmol/L 137 134(L) 130(L)  Potassium 3.5 - 5.1 mmol/L 3.8 3.7 4.1  Chloride 98 - 111 mmol/L 108 107 102  CO2 22 - 32 mmol/L 22 18(L) 18(L)  Calcium 8.9 - 10.3 mg/dL 8.7(L) 8.2(L) 8.7(L)  Total Protein 6.5 - 8.1 g/dL 6.4(L) 5.8(L) -  Total Bilirubin 0.3 - 1.2 mg/dL 0.6 0.7 -  Alkaline Phos 38 - 126 U/L 215(H) 166(H) -  AST 15 - 41 U/L 241(H) 202(H) -  ALT 0 - 44 U/L 349(H) 261(H) -     Impression/recommendation ESBL e.coli bacteremia and UTI Leucocytosis worsening - but no fever - if wbc continues to increase then will have to repeat imaging of the abdomen/kidney Continue IV Meropenem until leucocytosis resolve Transaminitis worsening- could be infection, but she is on  medications including trileptal stain ( has been stopped) which can cause it to be elevated Agree with hepatitis screen

## 2021-07-29 NOTE — Progress Notes (Signed)
[facility] PROGRESS NOTE    Amy Mcconnell  BMW:[id number] DOB: [birth year] DOA: 07/26/2021 PCP: Leone Haven, MD      Brief Narrative:  Amy Mcconnell is a 70 y.o. F with bipolar disorder, schizophrenia, HTN, hypothyroidism who presented with nausea and vomiting.  Evidently the patient's been sick at home for several days with malaise, nausea, weakness, and vomiting as well as dysuria, polyuria and urgency.  Family she was getting worse so she came to the ER, where she was febrile, WBC 19.9K, platelets less than 100K.  Urinalysis suggsted infection.  Started on antibiotics and admitted.        Assessment & Plan:  Severe sepsis due to ESBL E. coli bacteremia, urinary source Patient presented with thrombocytopenia, transaminitis, and bacteremia.  Blood cultures growing ESBL in 2 of 2.  Clinically improving, still some mild dysuria and low grade temp.  Urine culture growing ESBL too now.  Renal imaging without abscess or stone.    - Continue meropenem, day 3 - Consult infectious disease regarding agent and duration of treatment      Transaminitis No prior diagnosed liver disease that I see in chart but low level transamintiis present in the past.  Hepatocellular pattern here.  Trending up today.  Liver US unremarkable.  Denies acetaminophen use at home other than 650 mg nightly most nights, no extra prior to admission.  I do not believe there is anything reversible and urgent like cholangitis or acetaminophen toxicity.  Discussed with pharmacy, other than her statin, nothing that is particularly hepatotoxic.    LFTs trending up today - Hold statin - Avoid hepatotoxins - Trend LFTs - Check hepatitis serologies    Hypothyroidism - Continue levothyroxine  Schizoaffective disorder - Continue Geodon, oxcarbazepine, lithium  Hypertension Blood pressure controlled - Continue aspirin, atorvastatin, doxazosin - Hold amlodipine,  propranolol  Hyponatremia Mild asymptomatic   Facial droop Double vision Facial droop is slightly more than baseline, but patient feels this is from Geodon given in the ER.  MRI brain ruled out stroke.  Double vision is resolved.       Disposition: Status is: Inpatient  Remains inpatient appropriate because:IV treatments appropriate due to intensity of illness or inability to take PO  Dispo: The patient is from: Home              Anticipated d/c is to:  TBD              Patient currently is not medically stable to d/c.   Difficult to place patient No  Level of care: Med-Surg    Patient was admitted with severe sepsis due to ESBL bacteremia from urinary source.  From the standpoint of her infection, she is near ready for discharge, ID feels she may actually have oral agents she can take.  However her LFTs are still rising and I have no good explanation for this.  She may have an antibiotic regimen for her infection by tonight but I will trend LFTs one more day.  If peaking tomorrow and abx regimen established, likely tomorrow home.  If still rising LFTs tomorrow, will consult GI         MDM: The below labs and imaging reports were reviewed and summarized above.  Medication management as above.    DVT prophylaxis: Place TED hose Start: 07/26/21 1803  Code Status: Full code Family Communication: call to brother    Consultants:    Procedures:    Antimicrobials:  Ceftriaxone x1  on 9/21 Meropenem 9/22 >>   Culture data:  9/.21 Blood culture 2/2 with ESBL 9/21 urine culture ESBL          Subjective: Feels well.  No headache, abdominal pain, chest pain, dyspnea, vomiting, jaundice, fever, diarrhea  Objective: Vitals:   07/28/21 2025 07/28/21 2159 07/29/21 0444 07/29/21 0811  BP: 125/74  132/74 120/66  Pulse: 84  75 77  Resp: 18  16 20   Temp: (!) 100.4 F (38 C) 98.1 F (36.7 C) 97.9 F (36.6 C) 100.2 F (37.9 C)  TempSrc: Oral  Oral Oral   SpO2: 96%  98% 97%  Weight:      Height:        Intake/Output Summary (Last 24 hours) at 07/29/2021 1300 Last data filed at 07/29/2021 7124 Gross per 24 hour  Intake 206.57 ml  Output 1000 ml  Net -793.43 ml   Filed Weights   07/26/21 1328  Weight: 63.1 kg    Examination: General appearance: Elderly adult female, lying in bed, no acute distress     HEENT:    Skin:  Cardiac: RRR, no murmurs, no lower extremity edema Respiratory: Normal respiratory rate and rhythm, lungs clear without rales or wheezes Abdomen: Abdomen soft without tenderness palpation or guarding, no ascites or distention MSK:  Neuro: Awake and alert, extraocular movements intact, face with improving facial droop, speech fluent, strength normal in upper and lower extremities bilateral Psych: Sensorium intact and responding to questions, attention normal, affect normal, judgment insight normal     Data Reviewed: I have personally reviewed following labs and imaging studies:  CBC: Recent Labs  Lab 07/26/21 1330 07/27/21 0724 07/28/21 0526 07/29/21 0458  WBC 19.9* 20.6* 22.5* 28.2*  NEUTROABS 16.9* 17.7*  --   --   HGB 11.5* 10.2* 9.6* 11.2*  HCT 35.3* 30.4* 28.9* 34.0*  MCV 86.7 85.9 86.5 88.1  PLT 98* 98* 114* 580   Basic Metabolic Panel: Recent Labs  Lab 07/26/21 1330 07/27/21 0724 07/28/21 0526 07/29/21 0458  NA 126* 130* 134* 137  K 5.0 4.1 3.7 3.8  CL 94* 102 107 108  CO2 21* 18* 18* 22  GLUCOSE 132* 126* 114* 130*  BUN 34* 29* 25* 23  CREATININE 1.13* 1.06* 1.08* 0.99  CALCIUM 9.4 8.7* 8.2* 8.7*  MG  --  2.1  --   --   PHOS  --  3.1  --   --    GFR: Estimated Creatinine Clearance: 51.4 mL/min (by C-G formula based on SCr of 0.99 mg/dL). Liver Function Tests: Recent Labs  Lab 07/26/21 1330 07/28/21 0526 07/29/21 0458  AST 128* 202* 241*  ALT 172* 261* 349*  ALKPHOS 140* 166* 215*  BILITOT 0.8 0.7 0.6  PROT 7.3 5.8* 6.4*  ALBUMIN 3.4* 2.5* 2.6*   Recent Labs  Lab  07/26/21 1330  LIPASE 47   No results for input(s): AMMONIA in the last 168 hours. Coagulation Profile: Recent Labs  Lab 07/26/21 2245 07/27/21 0724  INR 1.2 1.3*   Cardiac Enzymes: No results for input(s): CKTOTAL, CKMB, CKMBINDEX, TROPONINI in the last 168 hours. BNP (last 3 results) No results for input(s): PROBNP in the last 8760 hours. HbA1C: No results for input(s): HGBA1C in the last 72 hours. CBG: No results for input(s): GLUCAP in the last 168 hours. Lipid Profile: No results for input(s): CHOL, HDL, LDLCALC, TRIG, CHOLHDL, LDLDIRECT in the last 72 hours. Thyroid Function Tests: No results for input(s): TSH, T4TOTAL, FREET4, T3FREE, THYROIDAB in the last  72 hours. Anemia Panel: No results for input(s): VITAMINB12, FOLATE, FERRITIN, TIBC, IRON, RETICCTPCT in the last 72 hours. Urine analysis:    Component Value Date/Time   COLORURINE YELLOW (A) 07/26/2021 1700   APPEARANCEUR CLOUDY (A) 07/26/2021 1700   APPEARANCEUR Clear 04/28/2021 1337   LABSPEC 1.006 07/26/2021 1700   LABSPEC 1.006 01/28/2015 1549   PHURINE 6.0 07/26/2021 1700   GLUCOSEU NEGATIVE 07/26/2021 1700   GLUCOSEU NEGATIVE 12/05/2020 1250   HGBUR MODERATE (A) 07/26/2021 1700   BILIRUBINUR NEGATIVE 07/26/2021 1700   BILIRUBINUR Negative 04/28/2021 1337   BILIRUBINUR Negative 01/28/2015 1549   KETONESUR NEGATIVE 07/26/2021 1700   PROTEINUR 30 (A) 07/26/2021 1700   UROBILINOGEN 0.2 12/05/2020 1250   NITRITE NEGATIVE 07/26/2021 1700   LEUKOCYTESUR LARGE (A) 07/26/2021 1700   LEUKOCYTESUR Trace 01/28/2015 1549   Sepsis Labs: @LABRCNTIP (procalcitonin:4,lacticacidven:4)  ) Recent Results (from the past 240 hour(s))  Resp Panel by RT-PCR (Flu A&B, Covid) Nasopharyngeal Swab     Status: None   Collection Time: 07/26/21  4:15 PM   Specimen: Nasopharyngeal Swab; Nasopharyngeal(NP) swabs in vial transport medium  Result Value Ref Range Status   SARS Coronavirus 2 by RT PCR NEGATIVE NEGATIVE Final     Comment: (NOTE) SARS-CoV-2 target nucleic acids are NOT DETECTED.  The SARS-CoV-2 RNA is generally detectable in upper respiratory specimens during the acute phase of infection. The lowest concentration of SARS-CoV-2 viral copies this assay can detect is 138 copies/mL. A negative result does not preclude SARS-Cov-2 infection and should not be used as the sole basis for treatment or other patient management decisions. A negative result may occur with  improper specimen collection/handling, submission of specimen other than nasopharyngeal swab, presence of viral mutation(s) within the areas targeted by this assay, and inadequate number of viral copies(<138 copies/mL). A negative result must be combined with clinical observations, patient history, and epidemiological information. The expected result is Negative.  Fact Sheet for Patients:  EntrepreneurPulse.com.au  Fact Sheet for Healthcare Providers:  IncredibleEmployment.be  This test is no t yet approved or cleared by the [place] FDA and  has been authorized for detection and/or diagnosis of SARS-CoV-2 by FDA under an Emergency Use Authorization (EUA). This EUA will remain  in effect (meaning this test can be used) for the duration of the COVID-19 declaration under Section 564(b)(1) of the Act, 21 U.S.C.section 360bbb-3(b)(1), unless the authorization is terminated  or revoked sooner.       Influenza A by PCR NEGATIVE NEGATIVE Final   Influenza B by PCR NEGATIVE NEGATIVE Final    Comment: (NOTE) The Xpert Xpress SARS-CoV-2/FLU/RSV plus assay is intended as an aid in the diagnosis of influenza from Nasopharyngeal swab specimens and should not be used as a sole basis for treatment. Nasal washings and aspirates are unacceptable for Xpert Xpress SARS-CoV-2/FLU/RSV testing.  Fact Sheet for Patients: EntrepreneurPulse.com.au  Fact Sheet for Healthcare  Providers: IncredibleEmployment.be  This test is not yet approved or cleared by the [place] FDA and has been authorized for detection and/or diagnosis of SARS-CoV-2 by FDA under an Emergency Use Authorization (EUA). This EUA will remain in effect (meaning this test can be used) for the duration of the COVID-19 declaration under Section 564(b)(1) of the Act, 21 U.S.C. section 360bbb-3(b)(1), unless the authorization is terminated or revoked.  Performed at [facility], [address], [place], [place] [address]   Urine Culture     Status: Abnormal   Collection Time: 07/26/21  5:00 PM   Specimen:  Urine, Random  Result Value Ref Range Status   Specimen Description   Final    URINE, RANDOM Performed at [facility], [address], [place], [place] [address]    Special Requests   Final    NONE Performed at [facility], [address] [place], [place] [address]    Culture (A)  Final    >=100,000 COLONIES/mL ESCHERICHIA COLI Confirmed Extended Spectrum Beta-Lactamase Producer (ESBL).  In bloodstream infections from ESBL organisms, carbapenems are preferred over piperacillin/tazobactam. They are shown to have a lower risk of mortality.    Report Status 07/29/2021 FINAL  Final   Organism ID, Bacteria ESCHERICHIA COLI (A)  Final      Susceptibility   Escherichia coli - MIC*    AMPICILLIN >=32 RESISTANT Resistant     CEFAZOLIN >=64 RESISTANT Resistant     CEFEPIME 4 INTERMEDIATE Intermediate     CEFTRIAXONE >=64 RESISTANT Resistant     CIPROFLOXACIN >=4 RESISTANT Resistant     GENTAMICIN >=16 RESISTANT Resistant     IMIPENEM <=0.25 SENSITIVE Sensitive     NITROFURANTOIN <=16 SENSITIVE Sensitive     TRIMETH/SULFA <=20 SENSITIVE Sensitive     AMPICILLIN/SULBACTAM 4 SENSITIVE Sensitive     PIP/TAZO <=4 SENSITIVE Sensitive     * >=100,000 COLONIES/mL ESCHERICHIA COLI  Blood culture (routine x 2)     Status: Abnormal   Collection  Time: 07/26/21  5:50 PM   Specimen: BLOOD  Result Value Ref Range Status   Specimen Description   Final    BLOOD RIGHT ANTECUBITAL Performed at [facility], [address] [place], [place] [address]    Special Requests   Final    BOTTLES DRAWN AEROBIC AND ANAEROBIC Blood Culture results may not be optimal due to an inadequate volume of blood received in culture bottles Performed at [facility], [address] [place], [place] [address]    Culture  Setup Time   Final    GRAM NEGATIVE RODS IN BOTH AEROBIC AND ANAEROBIC BOTTLES CRITICAL VALUE NOTED.  VALUE IS CONSISTENT WITH PREVIOUSLY REPORTED AND CALLED VALUE. Performed at [facility], [address] [place], [place] [address]    Culture (A)  Final    ESCHERICHIA COLI SUSCEPTIBILITIES PERFORMED ON PREVIOUS CULTURE WITHIN THE LAST 5 DAYS. Performed at [facility], [place] [address] [place], [place] [address]    Report Status 07/29/2021 FINAL  Final  Blood culture (routine x 2)     Status: Abnormal   Collection Time: 07/26/21  5:50 PM   Specimen: BLOOD  Result Value Ref Range Status   Specimen Description   Final    BLOOD BLOOD RIGHT FOREARM Performed at [facility], [address], [place], [place] [address]    Special Requests   Final    BOTTLES DRAWN AEROBIC AND ANAEROBIC Blood Culture results may not be optimal due to an inadequate volume of blood received in culture bottles Performed at [facility], [address] [place], [place] [address]    Culture  Setup Time   Final    GRAM NEGATIVE RODS IN BOTH AEROBIC AND ANAEROBIC BOTTLES CRITICAL RESULT CALLED TO, READ BACK BY AND VERIFIED WITH: NATHAN BLUE@0300  07/27/21 RH Performed at [facility], [place] [address], [place],  [address]    Culture (A)  Final    ESCHERICHIA COLI Confirmed Extended Spectrum Beta-Lactamase Producer (ESBL).  In bloodstream infections from ESBL organisms, carbapenems are  preferred over piperacillin/tazobactam.  They are shown to have a lower risk of mortality.    Report Status 07/29/2021 FINAL  Final   Organism ID, Bacteria ESCHERICHIA COLI  Final      Susceptibility   Escherichia coli - MIC*    AMPICILLIN >=32 RESISTANT Resistant     CEFAZOLIN >=64 RESISTANT Resistant     CEFEPIME 2 SENSITIVE Sensitive     CEFTAZIDIME RESISTANT Resistant     CEFTRIAXONE >=64 RESISTANT Resistant     CIPROFLOXACIN >=4 RESISTANT Resistant     GENTAMICIN >=16 RESISTANT Resistant     IMIPENEM <=0.25 SENSITIVE Sensitive     TRIMETH/SULFA <=20 SENSITIVE Sensitive     AMPICILLIN/SULBACTAM 4 SENSITIVE Sensitive     PIP/TAZO <=4 SENSITIVE Sensitive     * ESCHERICHIA COLI  Blood Culture ID Panel (Reflexed)     Status: Abnormal   Collection Time: 07/26/21  5:50 PM  Result Value Ref Range Status   Enterococcus faecalis NOT DETECTED NOT DETECTED Final   Enterococcus Faecium NOT DETECTED NOT DETECTED Final   Listeria monocytogenes NOT DETECTED NOT DETECTED Final   Staphylococcus species NOT DETECTED NOT DETECTED Final   Staphylococcus aureus (BCID) NOT DETECTED NOT DETECTED Final   Staphylococcus epidermidis NOT DETECTED NOT DETECTED Final   Staphylococcus lugdunensis NOT DETECTED NOT DETECTED Final   Streptococcus species NOT DETECTED NOT DETECTED Final   Streptococcus agalactiae NOT DETECTED NOT DETECTED Final   Streptococcus pneumoniae NOT DETECTED NOT DETECTED Final   Streptococcus pyogenes NOT DETECTED NOT DETECTED Final   A.calcoaceticus-baumannii NOT DETECTED NOT DETECTED Final   Bacteroides fragilis NOT DETECTED NOT DETECTED Final   Enterobacterales DETECTED (A) NOT DETECTED Final    Comment: Enterobacterales represent a large order of gram negative bacteria, not a single organism. CRITICAL RESULT CALLED TO, READ BACK BY AND VERIFIED WITH: NATHAN BLUE@0300  07/27/21 RH C/KATIE ALLRED@0302  07/27/21 RH    Enterobacter cloacae complex NOT DETECTED NOT DETECTED Final    Escherichia coli DETECTED (A) NOT DETECTED Final    Comment: CRITICAL RESULT CALLED TO, READ BACK BY AND VERIFIED WITH: NATHAN BLUE@0300  07/27/21 RH C/KATIE ALLRED@0302  07/27/21 RH    Klebsiella aerogenes NOT DETECTED NOT DETECTED Final   Klebsiella oxytoca NOT DETECTED NOT DETECTED Final   Klebsiella pneumoniae NOT DETECTED NOT DETECTED Final   Proteus species NOT DETECTED NOT DETECTED Final   Salmonella species NOT DETECTED NOT DETECTED Final   Serratia marcescens NOT DETECTED NOT DETECTED Final   Haemophilus influenzae NOT DETECTED NOT DETECTED Final   Neisseria meningitidis NOT DETECTED NOT DETECTED Final   Pseudomonas aeruginosa NOT DETECTED NOT DETECTED Final   Stenotrophomonas maltophilia NOT DETECTED NOT DETECTED Final   Candida albicans NOT DETECTED NOT DETECTED Final   Candida auris NOT DETECTED NOT DETECTED Final   Candida glabrata NOT DETECTED NOT DETECTED Final   Candida krusei NOT DETECTED NOT DETECTED Final   Candida parapsilosis NOT DETECTED NOT DETECTED Final   Candida tropicalis NOT DETECTED NOT DETECTED Final   Cryptococcus neoformans/gattii NOT DETECTED NOT DETECTED Final   CTX-M ESBL DETECTED (A) NOT DETECTED Final    Comment: CRITICAL RESULT CALLED TO, READ BACK BY AND VERIFIED WITH: NATHAN BLUE@0300  07/27/21 RH C/KATIE ALLRED@0302  07/27/21 RH (NOTE) Extended spectrum beta-lactamase detected. Recommend a carbapenem as initial therapy.      Carbapenem resistance IMP NOT DETECTED NOT DETECTED Final   Carbapenem resistance KPC NOT DETECTED NOT DETECTED Final   Carbapenem resistance NDM NOT DETECTED NOT DETECTED Final   Carbapenem resist OXA 48 LIKE NOT DETECTED  NOT DETECTED Final   Carbapenem resistance VIM NOT DETECTED NOT DETECTED Final    Comment: Performed at [facility], [address] [place], [place] [address]  CULTURE, BLOOD (ROUTINE X 2) w Reflex to ID Panel     Status: None (Preliminary result)   Collection Time: 07/29/21 12:18 AM    Specimen: BLOOD  Result Value Ref Range Status   Specimen Description BLOOD LEFT ANTECUBITAL  Final   Special Requests   Final    BOTTLES DRAWN AEROBIC AND ANAEROBIC Blood Culture adequate volume   Culture   Final    NO GROWTH < 12 HOURS Performed at [facility], [address], [place], [place] [address]    Report Status PENDING  Incomplete  CULTURE, BLOOD (ROUTINE X 2) w Reflex to ID Panel     Status: None (Preliminary result)   Collection Time: 07/29/21 12:19 AM   Specimen: BLOOD  Result Value Ref Range Status   Specimen Description BLOOD BLOOD LEFT HAND  Final   Special Requests   Final    BOTTLES DRAWN AEROBIC AND ANAEROBIC Blood Culture adequate volume   Culture   Final    NO GROWTH < 12 HOURS Performed at [facility], [address] [place], [place] [address]    Report Status PENDING  Incomplete         Radiology Studies: No results found.      Scheduled Meds:  vitamin C  1,000 mg Oral Daily   aspirin EC  81 mg Oral Daily   docusate sodium  100 mg Oral Daily   doxazosin  8 mg Oral BID   levothyroxine  125 mcg Oral QAC breakfast   lithium carbonate  600 mg Oral QHS   omega-3 acid ethyl esters  3,000 mg Oral Daily   OXcarbazepine  300 mg Oral Daily   And   OXcarbazepine  600 mg Oral QHS   vitamin B-12  1,000 mcg Oral Daily   vitamin E  1,000 Units Oral Daily   ziprasidone  40 mg Oral QHS   Continuous Infusions:  sodium chloride Stopped (07/28/21 2150)   meropenem (MERREM) IV 1 g (07/29/21 1043)     LOS: 2 days    Time spent: 25 minutes    Edwin Dada, MD [facility] 07/29/2021, 1:00 PM     Please page though [place] or Epic secure chat:  For [employer], Adult nurse

## 2021-07-29 NOTE — Progress Notes (Signed)
PHARMACY NOTE:  ANTIMICROBIAL RENAL DOSAGE ADJUSTMENT  Current antimicrobial regimen includes a mismatch between antimicrobial dosage and estimated renal function.  As per policy approved by the Pharmacy & Therapeutics and Medical Executive Committees, the antimicrobial dosage will be adjusted accordingly.  Current antimicrobial dosage:  Meropenem 1 g IV q12h  Indication: E coli bacteremia (ESBL gene detected)  Renal Function:  Estimated Creatinine Clearance: 51.4 mL/min (by C-G formula based on SCr of 0.99 mg/dL).    Antimicrobial dosage has been changed to:  Meropenem 1 g IV q8h  Additional comments: CrCl > 50 mL/min   Thank you for allowing pharmacy to be a part of this patient's care.  Benita Gutter, [facility] 07/29/2021 7:54 AM

## 2021-07-30 DIAGNOSIS — F3170 Bipolar disorder, currently in remission, most recent episode unspecified: Secondary | ICD-10-CM | POA: Diagnosis not present

## 2021-07-30 DIAGNOSIS — I10 Essential (primary) hypertension: Secondary | ICD-10-CM | POA: Diagnosis not present

## 2021-07-30 DIAGNOSIS — A419 Sepsis, unspecified organism: Secondary | ICD-10-CM

## 2021-07-30 DIAGNOSIS — S0101XA Laceration without foreign body of scalp, initial encounter: Secondary | ICD-10-CM

## 2021-07-30 DIAGNOSIS — F419 Anxiety disorder, unspecified: Secondary | ICD-10-CM | POA: Diagnosis not present

## 2021-07-30 DIAGNOSIS — N3000 Acute cystitis without hematuria: Secondary | ICD-10-CM | POA: Diagnosis not present

## 2021-07-30 LAB — COMPREHENSIVE METABOLIC PANEL
ALT: 508 U/L — ABNORMAL HIGH (ref 0–44)
AST: 294 U/L — ABNORMAL HIGH (ref 15–41)
Albumin: 2.3 g/dL — ABNORMAL LOW (ref 3.5–5.0)
Alkaline Phosphatase: 211 U/L — ABNORMAL HIGH (ref 38–126)
Anion gap: 6 (ref 5–15)
BUN: 22 mg/dL (ref 8–23)
CO2: 21 mmol/L — ABNORMAL LOW (ref 22–32)
Calcium: 8.5 mg/dL — ABNORMAL LOW (ref 8.9–10.3)
Chloride: 109 mmol/L (ref 98–111)
Creatinine, Ser: 1 mg/dL (ref 0.44–1.00)
GFR, Estimated: >60 mL/min (ref >60)
Glucose, Bld: 116 mg/dL — ABNORMAL HIGH (ref 70–99)
Potassium: 3.9 mmol/L (ref 3.5–5.1)
Sodium: 136 mmol/L (ref 135–145)
Total Bilirubin: 0.7 mg/dL (ref 0.3–1.2)
Total Protein: 6.2 g/dL — ABNORMAL LOW (ref 6.5–8.1)

## 2021-07-30 LAB — CBC
HCT: 31.7 % — ABNORMAL LOW (ref 36.0–46.0)
Hemoglobin: 10.8 g/dL — ABNORMAL LOW (ref 12.0–15.0)
MCH: 29.3 pg (ref 26.0–34.0)
MCHC: 34.1 g/dL (ref 30.0–36.0)
MCV: 85.9 fL (ref 80.0–100.0)
Platelets: 186 10*3/uL (ref 150–400)
RBC: 3.69 MIL/uL — ABNORMAL LOW (ref 3.87–5.11)
RDW: 15.3 % (ref 11.5–15.5)
WBC: 26.1 10*3/uL — ABNORMAL HIGH (ref 4.0–10.5)
nRBC: 0 % (ref 0.0–0.2)

## 2021-07-30 LAB — HEPATITIS PANEL, ACUTE
HCV Ab: NONREACTIVE
Hep A IgM: NONREACTIVE
Hep B C IgM: NONREACTIVE
Hepatitis B Surface Ag: NONREACTIVE

## 2021-07-30 MED ORDER — ENOXAPARIN SODIUM 40 MG/0.4ML IJ SOSY
40.0000 mg | PREFILLED_SYRINGE | INTRAMUSCULAR | Status: DC
  Administered 2021-07-30 – 2021-08-03 (×5): 40 mg via SUBCUTANEOUS
  Filled 2021-07-30 (×5): qty 0.4

## 2021-07-30 MED ORDER — SODIUM CHLORIDE 0.9 % IV SOLN: INTRAVENOUS | Status: DC

## 2021-07-30 NOTE — TOC Progression Note (Addendum)
Transition of Care [facility]) - Progression Note    Patient Details  Name: Amy Mcconnell MRN: [id number] Date of Birth: [birth year]  [facility] CM/SW Contact  Izola Price, RN Phone Number: 07/30/2021, 12:30 PM  Clinical Narrative: Reached out to patient via phone due to contact isolation. ESBL in blood and urine cultures. PICC line placement scheduled, likely for Monday. NMS stable today but reached out room phone with no answer and mobile phone where RN CM left VM with call back number to discuss [facility] agency preferences if any. Likely home with IV Abx due to ESBL now in + blood cultures x2. EDD likey 2-3 days per progression. Simmie Davies RN CM     Update: Was able to reach patient on phone call, very cooperative and is a Psychologist, occupational at [name]. She is okay with an agency that takes her insurance. Information/referral given to [employer]. [place] [employer] will get back to [facility] today or tomorrow. Patient is aware of plan for PICC line.  Simmie Davies RN CM 423 pm.    Expected Discharge Plan: Home/Self Care Barriers to Discharge: Continued Medical Work up  Expected Discharge Plan and Services Expected Discharge Plan: Home/Self Care       Living arrangements for the past 2 months: Single Family Home                                       Social Determinants of Health (SDOH) Interventions    Readmission Risk Interventions Readmission Risk Prevention Plan 07/28/2021  Transportation Screening Complete  PCP or Specialist Appt within 5-7 Days Complete  Home Care Screening Complete  Medication Review (RN CM) Complete  Some recent data might be hidden

## 2021-07-30 NOTE — Progress Notes (Signed)
PROGRESS NOTE  ZEPPELIN BECKSTRAND WUX:[id number] DOB: 09-06-1951 DOA: 07/26/2021 PCP: Leone Haven, MD   LOS: 3 days   Brief narrative: Mrs. Schaffert is a 70 y.o. F with bipolar disorder, schizophrenia, HTN, hypothyroidism presented hospital with nausea and vomiting malaise weakness with dysuria polyuria and urgency on presentation.  In the ED patient was febrile with leukocytosis and mild thrombocytopenia.  Urinalysis was abnormal.  Patient was started on IV antibiotic and was admitted hospital for further evaluation and treatment.  Assessment/Plan:  Principal Problem:   UTI (urinary tract infection) Active Problems:   Smoking history   Hyperlipidemia   Essential hypertension   Anxiety   Bipolar disorder (HCC)   Hypothyroidism   Scalp laceration   Sepsis ([place])   Severe sepsis due to ESBL E. coli bacteremia, urinary source Patient presented with thrombocytopenia, transaminitis, and bacteremia.  Blood cultures growing ESBL in 2 of 2.  Improving clinically.  Urine culture showing ESBL.  On meropenem day 4.  Continue IV fluids, normal saline 75 mL/h.  Volume depletion.  Continue IV fluids, encourage oral fluids    Elevated LFTs  Avoid hepatotoxins.  Liver ultrasound was unremarkable.  Elevated liver function test.  Continue to monitor.  AST ALT is elevated compared to yesterday.  Continue to monitor.  Continue to hold statin.  History of Tylenol occasional use in the nighttime.  Check LFTs in a.m.   Hypothyroidism Continue Synthroid  Schizoaffective disorder Continue Geodon, oxcarbazepine, lithium  Hypertension Continue aspirin, Lipitor, doxazosin.  Amlodipine and propanolol on hold  Hyponatremia Improved.   Facial droop/Double vision MRI of the brain was negative for acute stroke.  Double vision has improved.  Patient has mild facial droop at baseline.      DVT prophylaxis: Place TED hose Start: 07/26/21 1803  Code Status: Full code  Family Communication:  None  Status is: Inpatient  Remains inpatient appropriate because:IV treatments appropriate due to intensity of illness or inability to take PO and Inpatient level of care appropriate due to severity of illness  Dispo: The patient is from: Home              Anticipated d/c is to: Home with physical therapy.              Patient currently is not medically stable to d/c.   Difficult to place patient No   Consultants: None  Procedures: None  Anti-infectives:  Meropenem IV   Anti-infectives (From admission, onward)    Start     Dose/Rate Route Frequency Ordered Stop   07/29/21 1000  meropenem (MERREM) 1 g in sodium chloride 0.9 % 100 mL IVPB        1 g 200 mL/hr over 30 Minutes Intravenous Every 8 hours 07/29/21 0754     07/27/21 1800  cefTRIAXone (ROCEPHIN) 2 g in sodium chloride 0.9 % 100 mL IVPB  Status:  Discontinued        2 g 200 mL/hr over 30 Minutes Intravenous Every 24 hours 07/26/21 1804 07/27/21 0722   07/27/21 0730  meropenem (MERREM) 1 g in sodium chloride 0.9 % 100 mL IVPB  Status:  Discontinued        1 g 200 mL/hr over 30 Minutes Intravenous Every 12 hours 07/27/21 0722 07/29/21 0754   07/26/21 1745  cefTRIAXone (ROCEPHIN) 1 g in sodium chloride 0.9 % 100 mL IVPB        1 g 200 mL/hr over 30 Minutes Intravenous  Once 07/26/21 1741 07/26/21 1807  Subjective: Today, patient was seen and examined at bedside.  Patient denies any nausea vomiting, fever or chills.  Objective: Vitals:   07/30/21 0439 07/30/21 0740  BP: 110/62 128/81  Pulse: 78 83  Resp: 20 20  Temp: 98.5 F (36.9 C) 98.8 F (37.1 C)  SpO2: 95% 95%    Intake/Output Summary (Last 24 hours) at 07/30/2021 1202 Last data filed at 07/30/2021 1100 Gross per 24 hour  Intake 300 ml  Output 1700 ml  Net -1400 ml   Filed Weights   07/26/21 1328  Weight: 63.1 kg   Body mass index is 21.79 kg/m.   Physical Exam: GENERAL: Patient is alert awake and oriented. Not in obvious  distress. HENT: No scleral pallor or icterus. Pupils equally reactive to light. Oral mucosa is dry. NECK: is supple, no gross swelling noted. CHEST: Clear to auscultation. No crackles or wheezes.  Diminished breath sounds bilaterally. CVS: S1 and S2 heard, no murmur. Regular rate and rhythm.  ABDOMEN: Soft, non-tender, bowel sounds are present. EXTREMITIES: No edema. CNS: Cranial nerves are intact. No focal motor deficits. SKIN: warm and dry without rashes.  Data Review: I have personally reviewed the following laboratory data and studies,  CBC: Recent Labs  Lab 07/26/21 1330 07/27/21 0724 07/28/21 0526 07/29/21 0458 07/30/21 0429  WBC 19.9* 20.6* 22.5* 28.2* 26.1*  NEUTROABS 16.9* 17.7*  --   --   --   HGB 11.5* 10.2* 9.6* 11.2* 10.8*  HCT 35.3* 30.4* 28.9* 34.0* 31.7*  MCV 86.7 85.9 86.5 88.1 85.9  PLT 98* 98* 114* 151 382   Basic Metabolic Panel: Recent Labs  Lab 07/26/21 1330 07/27/21 0724 07/28/21 0526 07/29/21 0458 07/30/21 0429  NA 126* 130* 134* 137 136  K 5.0 4.1 3.7 3.8 3.9  CL 94* 102 107 108 109  CO2 21* 18* 18* 22 21*  GLUCOSE 132* 126* 114* 130* 116*  BUN 34* 29* 25* 23 22  CREATININE 1.13* 1.06* 1.08* 0.99 1.00  CALCIUM 9.4 8.7* 8.2* 8.7* 8.5*  MG  --  2.1  --   --   --   PHOS  --  3.1  --   --   --    Liver Function Tests: Recent Labs  Lab 07/26/21 1330 07/28/21 0526 07/29/21 0458 07/30/21 0429  AST 128* 202* 241* 294*  ALT 172* 261* 349* 508*  ALKPHOS 140* 166* 215* 211*  BILITOT 0.8 0.7 0.6 0.7  PROT 7.3 5.8* 6.4* 6.2*  ALBUMIN 3.4* 2.5* 2.6* 2.3*   Recent Labs  Lab 07/26/21 1330  LIPASE 47   No results for input(s): AMMONIA in the last 168 hours. Cardiac Enzymes: No results for input(s): CKTOTAL, CKMB, CKMBINDEX, TROPONINI in the last 168 hours. BNP (last 3 results) No results for input(s): BNP in the last 8760 hours.  ProBNP (last 3 results) No results for input(s): PROBNP in the last 8760 hours.  CBG: No results for  input(s): GLUCAP in the last 168 hours. Recent Results (from the past 240 hour(s))  Resp Panel by RT-PCR (Flu A&B, Covid) Nasopharyngeal Swab     Status: None   Collection Time: 07/26/21  4:15 PM   Specimen: Nasopharyngeal Swab; Nasopharyngeal(NP) swabs in vial transport medium  Result Value Ref Range Status   SARS Coronavirus 2 by RT PCR NEGATIVE NEGATIVE Final    Comment: (NOTE) SARS-CoV-2 target nucleic acids are NOT DETECTED.  The SARS-CoV-2 RNA is generally detectable in upper respiratory specimens during the acute phase of infection. The lowest concentration  of SARS-CoV-2 viral copies this assay can detect is 138 copies/mL. A negative result does not preclude SARS-Cov-2 infection and should not be used as the sole basis for treatment or other patient management decisions. A negative result may occur with  improper specimen collection/handling, submission of specimen other than nasopharyngeal swab, presence of viral mutation(s) within the areas targeted by this assay, and inadequate number of viral copies(<138 copies/mL). A negative result must be combined with clinical observations, patient history, and epidemiological information. The expected result is Negative.  Fact Sheet for Patients:  EntrepreneurPulse.com.au  Fact Sheet for Healthcare Providers:  IncredibleEmployment.be  This test is no t yet approved or cleared by the [place] FDA and  has been authorized for detection and/or diagnosis of SARS-CoV-2 by FDA under an Emergency Use Authorization (EUA). This EUA will remain  in effect (meaning this test can be used) for the duration of the COVID-19 declaration under Section 564(b)(1) of the Act, 21 U.S.C.section 360bbb-3(b)(1), unless the authorization is terminated  or revoked sooner.       Influenza A by PCR NEGATIVE NEGATIVE Final   Influenza B by PCR NEGATIVE NEGATIVE Final    Comment: (NOTE) The Xpert Xpress  SARS-CoV-2/FLU/RSV plus assay is intended as an aid in the diagnosis of influenza from Nasopharyngeal swab specimens and should not be used as a sole basis for treatment. Nasal washings and aspirates are unacceptable for Xpert Xpress SARS-CoV-2/FLU/RSV testing.  Fact Sheet for Patients: EntrepreneurPulse.com.au  Fact Sheet for Healthcare Providers: IncredibleEmployment.be  This test is not yet approved or cleared by the [place] FDA and has been authorized for detection and/or diagnosis of SARS-CoV-2 by FDA under an Emergency Use Authorization (EUA). This EUA will remain in effect (meaning this test can be used) for the duration of the COVID-19 declaration under Section 564(b)(1) of the Act, 21 U.S.C. section 360bbb-3(b)(1), unless the authorization is terminated or revoked.  Performed at [facility], [address], [place], [place] [address]   Urine Culture     Status: Abnormal   Collection Time: 07/26/21  5:00 PM   Specimen: Urine, Random  Result Value Ref Range Status   Specimen Description   Final    URINE, RANDOM Performed at [facility], [address], [place], [place] [address]    Special Requests   Final    NONE Performed at [facility], [address] [place], [place] [address]    Culture (A)  Final    >=100,000 COLONIES/mL ESCHERICHIA COLI Confirmed Extended Spectrum Beta-Lactamase Producer (ESBL).  In bloodstream infections from ESBL organisms, carbapenems are preferred over piperacillin/tazobactam. They are shown to have a lower risk of mortality.    Report Status 07/29/2021 FINAL  Final   Organism ID, Bacteria ESCHERICHIA COLI (A)  Final      Susceptibility   Escherichia coli - MIC*    AMPICILLIN >=32 RESISTANT Resistant     CEFAZOLIN >=64 RESISTANT Resistant     CEFEPIME 4 INTERMEDIATE Intermediate     CEFTRIAXONE >=64 RESISTANT Resistant     CIPROFLOXACIN >=4 RESISTANT Resistant      GENTAMICIN >=16 RESISTANT Resistant     IMIPENEM <=0.25 SENSITIVE Sensitive     NITROFURANTOIN <=16 SENSITIVE Sensitive     TRIMETH/SULFA <=20 SENSITIVE Sensitive     AMPICILLIN/SULBACTAM 4 SENSITIVE Sensitive     PIP/TAZO <=4 SENSITIVE Sensitive     * >=100,000 COLONIES/mL ESCHERICHIA COLI  Blood culture (routine x 2)     Status: Abnormal  Collection Time: 07/26/21  5:50 PM   Specimen: BLOOD  Result Value Ref Range Status   Specimen Description   Final    BLOOD RIGHT ANTECUBITAL Performed at [facility], [address] [place], [place] [address]    Special Requests   Final    BOTTLES DRAWN AEROBIC AND ANAEROBIC Blood Culture results may not be optimal due to an inadequate volume of blood received in culture bottles Performed at [facility], [address], [place], [place] [address]    Culture  Setup Time   Final    GRAM NEGATIVE RODS IN BOTH AEROBIC AND ANAEROBIC BOTTLES CRITICAL VALUE NOTED.  VALUE IS CONSISTENT WITH PREVIOUSLY REPORTED AND CALLED VALUE. Performed at [facility], [address] [place], [place] [address]    Culture (A)  Final    ESCHERICHIA COLI SUSCEPTIBILITIES PERFORMED ON PREVIOUS CULTURE WITHIN THE LAST 5 DAYS. Performed at [facility], [place] [address], [place], [place] [address]    Report Status 07/29/2021 FINAL  Final  Blood culture (routine x 2)     Status: Abnormal   Collection Time: 07/26/21  5:50 PM   Specimen: BLOOD  Result Value Ref Range Status   Specimen Description   Final    BLOOD BLOOD RIGHT FOREARM Performed at [facility], [address], [place], [place] [address]    Special Requests   Final    BOTTLES DRAWN AEROBIC AND ANAEROBIC Blood Culture results may not be optimal due to an inadequate volume of blood received in culture bottles Performed at [facility], [address], [place], [place] [address]    Culture  Setup Time   Final    GRAM NEGATIVE RODS IN BOTH  AEROBIC AND ANAEROBIC BOTTLES CRITICAL RESULT CALLED TO, READ BACK BY AND VERIFIED WITH: NATHAN BLUE@0300  07/27/21 RH Performed at [facility], [place] [address] [place], [place] [address]    Culture (A)  Final    ESCHERICHIA COLI Confirmed Extended Spectrum Beta-Lactamase Producer (ESBL).  In bloodstream infections from ESBL organisms, carbapenems are preferred over piperacillin/tazobactam. They are shown to have a lower risk of mortality.    Report Status 07/29/2021 FINAL  Final   Organism ID, Bacteria ESCHERICHIA COLI  Final      Susceptibility   Escherichia coli - MIC*    AMPICILLIN >=32 RESISTANT Resistant     CEFAZOLIN >=64 RESISTANT Resistant     CEFEPIME 2 SENSITIVE Sensitive     CEFTAZIDIME RESISTANT Resistant     CEFTRIAXONE >=64 RESISTANT Resistant     CIPROFLOXACIN >=4 RESISTANT Resistant     GENTAMICIN >=16 RESISTANT Resistant     IMIPENEM <=0.25 SENSITIVE Sensitive     TRIMETH/SULFA <=20 SENSITIVE Sensitive     AMPICILLIN/SULBACTAM 4 SENSITIVE Sensitive     PIP/TAZO <=4 SENSITIVE Sensitive     * ESCHERICHIA COLI  Blood Culture ID Panel (Reflexed)     Status: Abnormal   Collection Time: 07/26/21  5:50 PM  Result Value Ref Range Status   Enterococcus faecalis NOT DETECTED NOT DETECTED Final   Enterococcus Faecium NOT DETECTED NOT DETECTED Final   Listeria monocytogenes NOT DETECTED NOT DETECTED Final   Staphylococcus species NOT DETECTED NOT DETECTED Final   Staphylococcus aureus (BCID) NOT DETECTED NOT DETECTED Final   Staphylococcus epidermidis NOT DETECTED NOT DETECTED Final   Staphylococcus lugdunensis NOT DETECTED NOT DETECTED Final   Streptococcus species NOT DETECTED NOT DETECTED Final   Streptococcus agalactiae NOT DETECTED NOT DETECTED  Final   Streptococcus pneumoniae NOT DETECTED NOT DETECTED Final   Streptococcus pyogenes NOT DETECTED NOT DETECTED Final   A.calcoaceticus-baumannii NOT DETECTED NOT DETECTED Final   Bacteroides fragilis NOT DETECTED  NOT DETECTED Final   Enterobacterales DETECTED (A) NOT DETECTED Final    Comment: Enterobacterales represent a large order of gram negative bacteria, not a single organism. CRITICAL RESULT CALLED TO, READ BACK BY AND VERIFIED WITH: NATHAN BLUE@0300  07/27/21 RH C/KATIE ALLRED@0302  07/27/21 RH    Enterobacter cloacae complex NOT DETECTED NOT DETECTED Final   Escherichia coli DETECTED (A) NOT DETECTED Final    Comment: CRITICAL RESULT CALLED TO, READ BACK BY AND VERIFIED WITH: NATHAN BLUE@0300  07/27/21 RH C/KATIE ALLRED@0302  07/27/21 RH    Klebsiella aerogenes NOT DETECTED NOT DETECTED Final   Klebsiella oxytoca NOT DETECTED NOT DETECTED Final   Klebsiella pneumoniae NOT DETECTED NOT DETECTED Final   Proteus species NOT DETECTED NOT DETECTED Final   Salmonella species NOT DETECTED NOT DETECTED Final   Serratia marcescens NOT DETECTED NOT DETECTED Final   Haemophilus influenzae NOT DETECTED NOT DETECTED Final   Neisseria meningitidis NOT DETECTED NOT DETECTED Final   Pseudomonas aeruginosa NOT DETECTED NOT DETECTED Final   Stenotrophomonas maltophilia NOT DETECTED NOT DETECTED Final   Candida albicans NOT DETECTED NOT DETECTED Final   Candida auris NOT DETECTED NOT DETECTED Final   Candida glabrata NOT DETECTED NOT DETECTED Final   Candida krusei NOT DETECTED NOT DETECTED Final   Candida parapsilosis NOT DETECTED NOT DETECTED Final   Candida tropicalis NOT DETECTED NOT DETECTED Final   Cryptococcus neoformans/gattii NOT DETECTED NOT DETECTED Final   CTX-M ESBL DETECTED (A) NOT DETECTED Final    Comment: CRITICAL RESULT CALLED TO, READ BACK BY AND VERIFIED WITH: NATHAN BLUE@0300  07/27/21 RH C/KATIE ALLRED@0302  07/27/21 RH (NOTE) Extended spectrum beta-lactamase detected. Recommend a carbapenem as initial therapy.      Carbapenem resistance IMP NOT DETECTED NOT DETECTED Final   Carbapenem resistance KPC NOT DETECTED NOT DETECTED Final   Carbapenem resistance NDM NOT DETECTED NOT  DETECTED Final   Carbapenem resist OXA 48 LIKE NOT DETECTED NOT DETECTED Final   Carbapenem resistance VIM NOT DETECTED NOT DETECTED Final    Comment: Performed at [facility], [address] [place], [place] [address]  CULTURE, BLOOD (ROUTINE X 2) w Reflex to ID Panel     Status: None (Preliminary result)   Collection Time: 07/29/21 12:18 AM   Specimen: BLOOD  Result Value Ref Range Status   Specimen Description BLOOD LEFT ANTECUBITAL  Final   Special Requests   Final    BOTTLES DRAWN AEROBIC AND ANAEROBIC Blood Culture adequate volume   Culture   Final    NO GROWTH 1 DAY Performed at [facility], [address] [place], [place] [address]    Report Status PENDING  Incomplete  CULTURE, BLOOD (ROUTINE X 2) w Reflex to ID Panel     Status: None (Preliminary result)   Collection Time: 07/29/21 12:19 AM   Specimen: BLOOD  Result Value Ref Range Status   Specimen Description BLOOD BLOOD LEFT HAND  Final   Special Requests   Final    BOTTLES DRAWN AEROBIC AND ANAEROBIC Blood Culture adequate volume   Culture   Final    NO GROWTH 1 DAY Performed at [facility], [address] [place], [place] [address]    Report Status PENDING  Incomplete     Studies: No results found.    Flora Lipps, MD  [facility] 07/30/2021  If 7PM-7AM, please contact night-coverage

## 2021-07-31 ENCOUNTER — Telehealth: Payer: Medicare HMO | Admitting: Internal Medicine

## 2021-07-31 DIAGNOSIS — F419 Anxiety disorder, unspecified: Secondary | ICD-10-CM | POA: Diagnosis not present

## 2021-07-31 DIAGNOSIS — N3000 Acute cystitis without hematuria: Secondary | ICD-10-CM | POA: Diagnosis not present

## 2021-07-31 DIAGNOSIS — F3170 Bipolar disorder, currently in remission, most recent episode unspecified: Secondary | ICD-10-CM | POA: Diagnosis not present

## 2021-07-31 DIAGNOSIS — I10 Essential (primary) hypertension: Secondary | ICD-10-CM | POA: Diagnosis not present

## 2021-07-31 LAB — HEPATIC FUNCTION PANEL
ALT: 566 U/L — ABNORMAL HIGH (ref 0–44)
AST: 280 U/L — ABNORMAL HIGH (ref 15–41)
Albumin: 2.3 g/dL — ABNORMAL LOW (ref 3.5–5.0)
Alkaline Phosphatase: 254 U/L — ABNORMAL HIGH (ref 38–126)
Bilirubin, Direct: <0.1 mg/dL (ref 0.0–0.2)
Total Bilirubin: 0.7 mg/dL (ref 0.3–1.2)
Total Protein: 6.5 g/dL (ref 6.5–8.1)

## 2021-07-31 LAB — BASIC METABOLIC PANEL
Anion gap: 9 (ref 5–15)
BUN: 24 mg/dL — ABNORMAL HIGH (ref 8–23)
CO2: 21 mmol/L — ABNORMAL LOW (ref 22–32)
Calcium: 8.7 mg/dL — ABNORMAL LOW (ref 8.9–10.3)
Chloride: 109 mmol/L (ref 98–111)
Creatinine, Ser: 0.71 mg/dL (ref 0.44–1.00)
GFR, Estimated: >60 mL/min (ref >60)
Glucose, Bld: 104 mg/dL — ABNORMAL HIGH (ref 70–99)
Potassium: 4.4 mmol/L (ref 3.5–5.1)
Sodium: 139 mmol/L (ref 135–145)

## 2021-07-31 LAB — CBC
HCT: 34.2 % — ABNORMAL LOW (ref 36.0–46.0)
Hemoglobin: 11.1 g/dL — ABNORMAL LOW (ref 12.0–15.0)
MCH: 27.7 pg (ref 26.0–34.0)
MCHC: 32.5 g/dL (ref 30.0–36.0)
MCV: 85.3 fL (ref 80.0–100.0)
Platelets: 229 10*3/uL (ref 150–400)
RBC: 4.01 MIL/uL (ref 3.87–5.11)
RDW: 15.3 % (ref 11.5–15.5)
WBC: 23.2 10*3/uL — ABNORMAL HIGH (ref 4.0–10.5)
nRBC: 0 % (ref 0.0–0.2)

## 2021-07-31 MED ORDER — ACETAMINOPHEN 325 MG PO TABS
ORAL_TABLET | ORAL | Status: AC
  Filled 2021-07-31: qty 2

## 2021-07-31 MED ORDER — PANTOPRAZOLE SODIUM 40 MG PO TBEC
40.0000 mg | DELAYED_RELEASE_TABLET | Freq: Every day | ORAL | Status: DC
  Administered 2021-07-31 – 2021-08-04 (×5): 40 mg via ORAL
  Filled 2021-07-31 (×5): qty 1

## 2021-07-31 MED ORDER — IBUPROFEN 400 MG PO TABS
400.0000 mg | ORAL_TABLET | Freq: Four times a day (QID) | ORAL | Status: DC | PRN
  Administered 2021-07-31 – 2021-08-04 (×7): 400 mg via ORAL
  Filled 2021-07-31 (×7): qty 1

## 2021-07-31 MED ORDER — ACETAMINOPHEN 325 MG PO TABS
650.0000 mg | ORAL_TABLET | Freq: Four times a day (QID) | ORAL | Status: DC | PRN
Start: 2021-07-31 — End: 2021-07-31

## 2021-07-31 NOTE — Progress Notes (Signed)
Order received from Dr Louanne Belton to discontinue G.I. panel order

## 2021-07-31 NOTE — TOC Progression Note (Signed)
Transition of Care [facility]) - Progression Note    Patient Details  Name: Amy Mcconnell MRN: [id number] Date of Birth: 1951/06/30  Transition [facility]) CM/SW Contact  Treyana Sessions, RN Phone Number: 07/31/2021, 10:24 AM  Clinical Narrative:    Notified that [facility] has accepted referral    Expected Discharge Plan: Home/Self Care Barriers to Discharge: Continued Medical Work up  Expected Discharge Plan and Services Expected Discharge Plan: Home/Self Care       Living arrangements for the past 2 months: Single Family Home                                       Social Determinants of Health (SDOH) Interventions    Readmission Risk Interventions Readmission Risk Prevention Plan 07/28/2021  Transportation Screening Complete  PCP or Specialist Appt within 5-7 Days Complete  Home Care Screening Complete  Medication Review (RN CM) Complete  Some recent data might be hidden

## 2021-07-31 NOTE — Progress Notes (Signed)
ID Doing better No fever Appetitie better Patient Vitals for the past 24 hrs:  BP Temp Temp src Pulse Resp SpO2  08/01/21 1550 124/78 -- -- 85 18 97 %  08/01/21 0754 114/70 -- -- 82 18 95 %  08/01/21 0448 126/73 98.1 F (36.7 C) Oral 76 18 100 %    Awake and alert Speech better Dystonic movts of her face much better [place] CTA Hss1s2 Abd soft   Labs CBC Latest Ref Rng & Units 07/31/2021 07/30/2021 07/29/2021  WBC 4.0 - 10.5 K/uL 23.2(H) 26.1(H) 28.2(H)  Hemoglobin 12.0 - 15.0 g/dL 11.1(L) 10.8(L) 11.2(L)  Hematocrit 36.0 - 46.0 % 34.2(L) 31.7(L) 34.0(L)  Platelets 150 - 400 K/uL 229 186 151    CMP Latest Ref Rng & Units 08/01/2021 07/31/2021 07/30/2021  Glucose 70 - 99 mg/dL 94 104(H) 116(H)  BUN 8 - 23 mg/dL 20 24(H) 22  Creatinine 0.44 - 1.00 mg/dL 0.76 0.71 1.00  Sodium 135 - 145 mmol/L 137 139 136  Potassium 3.5 - 5.1 mmol/L 4.5 4.4 3.9  Chloride 98 - 111 mmol/L 106 109 109  CO2 22 - 32 mmol/L 22 21(L) 21(L)  Calcium 8.9 - 10.3 mg/dL 8.8(L) 8.7(L) 8.5(L)  Total Protein 6.5 - 8.1 g/dL 6.4(L) 6.5 6.2(L)  Total Bilirubin 0.3 - 1.2 mg/dL 0.7 0.7 0.7  Alkaline Phos 38 - 126 U/L 210(H) 254(H) 211(H)  AST 15 - 41 U/L 146(H) 280(H) 294(H)  ALT 0 - 44 U/L 386(H) 566(H) 508(H)      Micro 07/26/21 ESBL e.coli BC   Impression/recommendation ESBL e.coli bacteremia and UTI Leucocytosis slowly declining  -  no fever in many days- Continue IV Meropenem  ( day 6) until leucocytosis resolve. She needs a total of 10 days of antibiotic- may do bactrim Po once leucocytosis resolves Transaminitis /increase in alkaline phosphatase- cholestatic/mixed  picture   could be infection, but she is on  medications including trileptal/ statin ( has been stopped) which can also contribute to it ??ascending cholangitis  Discussed the management with patient and care team

## 2021-07-31 NOTE — Progress Notes (Signed)
Physical Therapy Treatment Patient Details Name: Amy Mcconnell MRN: [id number] DOB: 1951-09-30 Today's Date: 07/31/2021   History of Present Illness Amy Mcconnell is a 70 y.o. female with medical history significant for hyperlipidemia, hypothyroid, history of bipolar, history of schizophrenia, anxiety/depression, hyperlipidemia, hypertension, history of smoking, presents emergency department for chief concerns of nausea and vomiting.    PT Comments    Pt seated upright in bed agreeable to PT services. Disconnected to IV pole currently. Frequent interruptions throughout session d/t MD visit, need to toilet, and frequent seated rest breaks. Prior to ambulation pt educated and performed supine and seated therex with good understanding and form after PT education for LE strengthening and blood clot prevention. Pt remains mod-I with bed mobility, standing at EOB and supervision to ambulate in room with no AD. Pt voiding bladder continently with mod-I and no AD. After seated rest at EOB post toileting, pt ambulating 160' in hallway with use of hand rail throughout. X1 minor stagger/LOB requiring PT minA to correct but otherwise remains minguard throughout for safety. Pt quick to become SOB and requiring external assistance for balance with functional mobility at this time thus D/c recs remain appropriate. HR trending in low 90's BPM and SPO2 at 98% throughout.   Recommendations for follow up therapy are one component of a multi-disciplinary discharge planning process, led by the attending physician.  Recommendations may be updated based on patient status, additional functional criteria and insurance authorization.  Follow Up Recommendations  Home health PT     Equipment Recommendations  None recommended by PT    Recommendations for Other Services       Precautions / Restrictions Precautions Precautions: Fall Restrictions Weight Bearing Restrictions: No     Mobility  Bed  Mobility Overal bed mobility: Modified Independent               Patient Response: Cooperative  Transfers Overall transfer level: Modified independent                  Ambulation/Gait Ambulation/Gait assistance: Min guard Gait Distance (Feet): 160 Feet Assistive device: None (hall way railing) Gait Pattern/deviations: Step-through pattern;Narrow base of support;Drifts right/left     General Gait Details: Ambs with supervision in room. Minguard provided in hallway with use of hand rail along hallway for stability.   Stairs             Wheelchair Mobility    Modified Rankin (Stroke Patients Only)       Balance Overall balance assessment: Modified Independent;Needs assistance Sitting-balance support: No upper extremity supported;Feet supported Sitting balance-Leahy Scale: Normal       Standing balance-Leahy Scale: Good                 High Level Balance Comments: supervision to navigate obstacles in room.            Cognition Arousal/Alertness: Awake/alert Behavior During Therapy: WFL for tasks assessed/performed Overall Cognitive Status: Within Functional Limits for tasks assessed                                        Exercises General Exercises - Lower Extremity Ankle Circles/Pumps: AROM;20 reps;Supine;Both Long Arc Quad: AROM;Strengthening;20 reps;Seated;Both Straight Leg Raises: AROM;20 reps;Supine;Strengthening;Both    General Comments        Pertinent Vitals/Pain Pain Assessment: Faces Faces Pain Scale: Hurts a little bit Pain Location: "R side"  Pain Intervention(s): Monitored during session    Home Living                      Prior Function            PT Goals (current goals can now be found in the care plan section) Acute Rehab PT Goals Patient Stated Goal: to go home. PT Goal Formulation: With patient Time For Goal Achievement: 08/11/21 Potential to Achieve Goals: Good Progress  towards PT goals: Progressing toward goals    Frequency    Min 2X/week      PT Plan Current plan remains appropriate    Co-evaluation              AM-PAC PT "6 Clicks" Mobility   Outcome Measure  Help needed turning from your back to your side while in a flat bed without using bedrails?: None Help needed moving from lying on your back to sitting on the side of a flat bed without using bedrails?: None Help needed moving to and from a bed to a chair (including a wheelchair)?: None Help needed standing up from a chair using your arms (e.g., wheelchair or bedside chair)?: None Help needed to walk in hospital room?: A Little Help needed climbing 3-5 steps with a railing? : A Little 6 Click Score: 22    End of Session Equipment Utilized During Treatment: Gait belt Activity Tolerance: Patient tolerated treatment well Patient left: in bed;with call bell/phone within reach;with nursing/sitter in room Nurse Communication: Mobility status PT Visit Diagnosis: Unsteadiness on feet (R26.81)     Time: 0930-1003 PT Time Calculation (min) (ACUTE ONLY): 33 min  Charges:  $Therapeutic Exercise: 23-37 mins                     Deserea Bordley M. Fairly IV, PT, DPT Physical Therapist- [facility]  07/31/2021, 10:13 AM

## 2021-07-31 NOTE — Progress Notes (Signed)
PROGRESS NOTE  Amy Mcconnell YCX:[id number] DOB: 1950/12/18 DOA: 07/26/2021 PCP: Leone Haven, MD   LOS: 4 days   Brief narrative:  Amy Mcconnell is a 70 y.o. F with bipolar disorder, schizophrenia, HTN, hypothyroidism presented hospital with nausea and vomiting, malaise weakness with dysuria, polyuria and urgency on presentation.  In the ED, patient was febrile with leukocytosis and mild thrombocytopenia.  Urinalysis was abnormal.  Patient was started on IV antibiotic and was admitted hospital for further evaluation and treatment.  Assessment/Plan:  Principal Problem:   UTI (urinary tract infection) Active Problems:   Smoking history   Hyperlipidemia   Essential hypertension   Anxiety   Bipolar disorder (HCC)   Hypothyroidism   Scalp laceration   Sepsis ([place])  Severe sepsis due to ESBL E. coli bacteremia, urinary source Patient presented with thrombocytopenia, elevated LFTs, and bacteremia.  Blood cultures growing ESBL in 2 of 2.  Improving clinically.  Urine culture showing ESBL.  On meropenem day 5, continue to give meropenem until leukocytosis is significantly improved.  Still with WBC at 23,000.  Blood cultures negative in 2 days.  Infectious disease on board.  Volume depletion.  Improved with IV fluid.   Elevated LFTs  Avoid hepatotoxins.  Liver ultrasound was unremarkable.  Elevated liver function test.  Continue to monitor.  Continue to hold statin, hold Tylenol.  .  Trend CMP   Hypothyroidism Continue Synthroid  Schizoaffective disorder Continue Geodon, oxcarbazepine, lithium  Hypertension Continue aspirin, Lipitor, doxazosin.  Amlodipine and propanolol on hold  Hyponatremia Improved.   Facial droop/Double vision MRI of the brain was negative for acute stroke.  Double vision has improved.  Patient has mild facial droop at baseline.      DVT prophylaxis: enoxaparin (LOVENOX) injection 40 mg Start: 07/30/21 2200 Place TED hose Start: 07/26/21  1803  Code Status: Full code  Family Communication: None  Status is: Inpatient  Remains inpatient appropriate because:IV treatments appropriate due to intensity of illness or inability to take PO and Inpatient level of care appropriate due to severity of illness  Dispo: The patient is from: Home              Anticipated d/c is to: Home with physical therapy.              Patient currently is not medically stable to d/c.   Difficult to place patient No   Consultants: None  Procedures: None  Anti-infectives:  Meropenem IV   Anti-infectives (From admission, onward)    Start     Dose/Rate Route Frequency Ordered Stop   07/29/21 1000  meropenem (MERREM) 1 g in sodium chloride 0.9 % 100 mL IVPB        1 g 200 mL/hr over 30 Minutes Intravenous Every 8 hours 07/29/21 0754     07/27/21 1800  cefTRIAXone (ROCEPHIN) 2 g in sodium chloride 0.9 % 100 mL IVPB  Status:  Discontinued        2 g 200 mL/hr over 30 Minutes Intravenous Every 24 hours 07/26/21 1804 07/27/21 0722   07/27/21 0730  meropenem (MERREM) 1 g in sodium chloride 0.9 % 100 mL IVPB  Status:  Discontinued        1 g 200 mL/hr over 30 Minutes Intravenous Every 12 hours 07/27/21 0722 07/29/21 0754   07/26/21 1745  cefTRIAXone (ROCEPHIN) 1 g in sodium chloride 0.9 % 100 mL IVPB        1 g 200 mL/hr over 30 Minutes Intravenous  Once 07/26/21 1741 07/26/21 1807      Subjective: Today patient was seen and examined at bedside.  Denies any fever chills or rigor.  Complains of mild headache.  Objective: Vitals:   07/30/21 2045 07/31/21 0800  BP: 136/72 124/73  Pulse: 78 77  Resp: 16 18  Temp: 99.1 F (37.3 C)   SpO2: 98% 96%    Intake/Output Summary (Last 24 hours) at 07/31/2021 1037 Last data filed at 07/31/2021 1003 Gross per 24 hour  Intake 552.14 ml  Output 1500 ml  Net -947.86 ml    Filed Weights   07/26/21 1328  Weight: 63.1 kg   Body mass index is 21.79 kg/m.   Physical Exam: GENERAL: Patient is  alert awake and oriented. Not in obvious distress. HENT: No scleral pallor or icterus. Pupils equally reactive to light. Oral mucosa is dry. NECK: is supple, no gross swelling noted. CHEST: Clear to auscultation. No crackles or wheezes.  Diminished breath sounds bilaterally. CVS: S1 and S2 heard, no murmur. Regular rate and rhythm.  ABDOMEN: Soft, non-tender, bowel sounds are present. EXTREMITIES: No edema.  Bilateral knees with the scabs. CNS: Cranial nerves are intact.  Moves extremities. SKIN: warm and dry without rashes.  Data Review: I have personally reviewed the following laboratory data and studies,  CBC: Recent Labs  Lab 07/26/21 1330 07/27/21 0724 07/28/21 0526 07/29/21 0458 07/30/21 0429 07/31/21 0447  WBC 19.9* 20.6* 22.5* 28.2* 26.1* 23.2*  NEUTROABS 16.9* 17.7*  --   --   --   --   HGB 11.5* 10.2* 9.6* 11.2* 10.8* 11.1*  HCT 35.3* 30.4* 28.9* 34.0* 31.7* 34.2*  MCV 86.7 85.9 86.5 88.1 85.9 85.3  PLT 98* 98* 114* 151 186 440    Basic Metabolic Panel: Recent Labs  Lab 07/27/21 0724 07/28/21 0526 07/29/21 0458 07/30/21 0429 07/31/21 0447  NA 130* 134* 137 136 139  K 4.1 3.7 3.8 3.9 4.4  CL 102 107 108 109 109  CO2 18* 18* 22 21* 21*  GLUCOSE 126* 114* 130* 116* 104*  BUN 29* 25* 23 22 24*  CREATININE 1.06* 1.08* 0.99 1.00 0.71  CALCIUM 8.7* 8.2* 8.7* 8.5* 8.7*  MG 2.1  --   --   --   --   PHOS 3.1  --   --   --   --     Liver Function Tests: Recent Labs  Lab 07/26/21 1330 07/28/21 0526 07/29/21 0458 07/30/21 0429  AST 128* 202* 241* 294*  ALT 172* 261* 349* 508*  ALKPHOS 140* 166* 215* 211*  BILITOT 0.8 0.7 0.6 0.7  PROT 7.3 5.8* 6.4* 6.2*  ALBUMIN 3.4* 2.5* 2.6* 2.3*    Recent Labs  Lab 07/26/21 1330  LIPASE 47    No results for input(s): AMMONIA in the last 168 hours. Cardiac Enzymes: No results for input(s): CKTOTAL, CKMB, CKMBINDEX, TROPONINI in the last 168 hours. BNP (last 3 results) No results for input(s): BNP in the last  8760 hours.  ProBNP (last 3 results) No results for input(s): PROBNP in the last 8760 hours.  CBG: No results for input(s): GLUCAP in the last 168 hours. Recent Results (from the past 240 hour(s))  Resp Panel by RT-PCR (Flu A&B, Covid) Nasopharyngeal Swab     Status: None   Collection Time: 07/26/21  4:15 PM   Specimen: Nasopharyngeal Swab; Nasopharyngeal(NP) swabs in vial transport medium  Result Value Ref Range Status   SARS Coronavirus 2 by RT PCR NEGATIVE NEGATIVE Final  Comment: (NOTE) SARS-CoV-2 target nucleic acids are NOT DETECTED.  The SARS-CoV-2 RNA is generally detectable in upper respiratory specimens during the acute phase of infection. The lowest concentration of SARS-CoV-2 viral copies this assay can detect is 138 copies/mL. A negative result does not preclude SARS-Cov-2 infection and should not be used as the sole basis for treatment or other patient management decisions. A negative result may occur with  improper specimen collection/handling, submission of specimen other than nasopharyngeal swab, presence of viral mutation(s) within the areas targeted by this assay, and inadequate number of viral copies(<138 copies/mL). A negative result must be combined with clinical observations, patient history, and epidemiological information. The expected result is Negative.  Fact Sheet for Patients:  EntrepreneurPulse.com.au  Fact Sheet for Healthcare Providers:  IncredibleEmployment.be  This test is no t yet approved or cleared by the [place] FDA and  has been authorized for detection and/or diagnosis of SARS-CoV-2 by FDA under an Emergency Use Authorization (EUA). This EUA will remain  in effect (meaning this test can be used) for the duration of the COVID-19 declaration under Section 564(b)(1) of the Act, 21 U.S.C.section 360bbb-3(b)(1), unless the authorization is terminated  or revoked sooner.       Influenza A by PCR  NEGATIVE NEGATIVE Final   Influenza B by PCR NEGATIVE NEGATIVE Final    Comment: (NOTE) The Xpert Xpress SARS-CoV-2/FLU/RSV plus assay is intended as an aid in the diagnosis of influenza from Nasopharyngeal swab specimens and should not be used as a sole basis for treatment. Nasal washings and aspirates are unacceptable for Xpert Xpress SARS-CoV-2/FLU/RSV testing.  Fact Sheet for Patients: EntrepreneurPulse.com.au  Fact Sheet for Healthcare Providers: IncredibleEmployment.be  This test is not yet approved or cleared by the [place] FDA and has been authorized for detection and/or diagnosis of SARS-CoV-2 by FDA under an Emergency Use Authorization (EUA). This EUA will remain in effect (meaning this test can be used) for the duration of the COVID-19 declaration under Section 564(b)(1) of the Act, 21 U.S.C. section 360bbb-3(b)(1), unless the authorization is terminated or revoked.  Performed at [facility], [address] [place], [place] [address]   Urine Culture     Status: Abnormal   Collection Time: 07/26/21  5:00 PM   Specimen: Urine, Random  Result Value Ref Range Status   Specimen Description   Final    URINE, RANDOM Performed at [facility], [address], [place], [place] [address]    Special Requests   Final    NONE Performed at [facility], [address] [place],  [address]    Culture (A)  Final    >=100,000 COLONIES/mL ESCHERICHIA COLI Confirmed Extended Spectrum Beta-Lactamase Producer (ESBL).  In bloodstream infections from ESBL organisms, carbapenems are preferred over piperacillin/tazobactam. They are shown to have a lower risk of mortality.    Report Status 07/29/2021 FINAL  Final   Organism ID, Bacteria ESCHERICHIA COLI (A)  Final      Susceptibility   Escherichia coli - MIC*    AMPICILLIN >=32 RESISTANT Resistant     CEFAZOLIN >=64 RESISTANT Resistant     CEFEPIME 4  INTERMEDIATE Intermediate     CEFTRIAXONE >=64 RESISTANT Resistant     CIPROFLOXACIN >=4 RESISTANT Resistant     GENTAMICIN >=16 RESISTANT Resistant     IMIPENEM <=0.25 SENSITIVE Sensitive     NITROFURANTOIN <=16 SENSITIVE Sensitive     TRIMETH/SULFA <=20 SENSITIVE Sensitive     AMPICILLIN/SULBACTAM 4 SENSITIVE Sensitive  PIP/TAZO <=4 SENSITIVE Sensitive     * >=100,000 COLONIES/mL ESCHERICHIA COLI  Blood culture (routine x 2)     Status: Abnormal   Collection Time: 07/26/21  5:50 PM   Specimen: BLOOD  Result Value Ref Range Status   Specimen Description   Final    BLOOD RIGHT ANTECUBITAL Performed at [facility], [address] [place], [place] [address]    Special Requests   Final    BOTTLES DRAWN AEROBIC AND ANAEROBIC Blood Culture results may not be optimal due to an inadequate volume of blood received in culture bottles Performed at [facility], [address], [place], [place] [address]    Culture  Setup Time   Final    GRAM NEGATIVE RODS IN BOTH AEROBIC AND ANAEROBIC BOTTLES CRITICAL VALUE NOTED.  VALUE IS CONSISTENT WITH PREVIOUSLY REPORTED AND CALLED VALUE. Performed at [facility], [address] [place], [place] [address]    Culture (A)  Final    ESCHERICHIA COLI SUSCEPTIBILITIES PERFORMED ON PREVIOUS CULTURE WITHIN THE LAST 5 DAYS. Performed at [facility], [place] [address], [place], [place] [address]    Report Status 07/29/2021 FINAL  Final  Blood culture (routine x 2)     Status: Abnormal   Collection Time: 07/26/21  5:50 PM   Specimen: BLOOD  Result Value Ref Range Status   Specimen Description   Final    BLOOD BLOOD RIGHT FOREARM Performed at [facility], [address] [place], [place] [address]    Special Requests   Final    BOTTLES DRAWN AEROBIC AND ANAEROBIC Blood Culture results may not be optimal due to an inadequate volume of blood received in culture bottles Performed at [facility],  [address] [place], [place] [address]    Culture  Setup Time   Final    GRAM NEGATIVE RODS IN BOTH AEROBIC AND ANAEROBIC BOTTLES CRITICAL RESULT CALLED TO, READ BACK BY AND VERIFIED WITH: NATHAN BLUE@0300  07/27/21 RH Performed at [facility], [place] [address], [place], [place] [address]    Culture (A)  Final    ESCHERICHIA COLI Confirmed Extended Spectrum Beta-Lactamase Producer (ESBL).  In bloodstream infections from ESBL organisms, carbapenems are preferred over piperacillin/tazobactam. They are shown to have a lower risk of mortality.    Report Status 07/29/2021 FINAL  Final   Organism ID, Bacteria ESCHERICHIA COLI  Final      Susceptibility   Escherichia coli - MIC*    AMPICILLIN >=32 RESISTANT Resistant     CEFAZOLIN >=64 RESISTANT Resistant     CEFEPIME 2 SENSITIVE Sensitive     CEFTAZIDIME RESISTANT Resistant     CEFTRIAXONE >=64 RESISTANT Resistant     CIPROFLOXACIN >=4 RESISTANT Resistant     GENTAMICIN >=16 RESISTANT Resistant     IMIPENEM <=0.25 SENSITIVE Sensitive     TRIMETH/SULFA <=20 SENSITIVE Sensitive     AMPICILLIN/SULBACTAM 4 SENSITIVE Sensitive     PIP/TAZO <=4 SENSITIVE Sensitive     * ESCHERICHIA COLI  Blood Culture ID Panel (Reflexed)     Status: Abnormal   Collection Time: 07/26/21  5:50 PM  Result Value Ref Range Status   Enterococcus faecalis NOT DETECTED NOT DETECTED Final   Enterococcus Faecium NOT DETECTED NOT DETECTED Final   Listeria monocytogenes NOT DETECTED NOT DETECTED Final   Staphylococcus species NOT DETECTED NOT DETECTED Final   Staphylococcus aureus (BCID) NOT DETECTED NOT DETECTED Final   Staphylococcus epidermidis NOT DETECTED NOT DETECTED  Final   Staphylococcus lugdunensis NOT DETECTED NOT DETECTED Final   Streptococcus species NOT DETECTED NOT DETECTED Final   Streptococcus agalactiae NOT DETECTED NOT DETECTED Final   Streptococcus pneumoniae NOT DETECTED NOT DETECTED Final   Streptococcus pyogenes NOT DETECTED NOT  DETECTED Final   A.calcoaceticus-baumannii NOT DETECTED NOT DETECTED Final   Bacteroides fragilis NOT DETECTED NOT DETECTED Final   Enterobacterales DETECTED (A) NOT DETECTED Final    Comment: Enterobacterales represent a large order of gram negative bacteria, not a single organism. CRITICAL RESULT CALLED TO, READ BACK BY AND VERIFIED WITH: NATHAN BLUE@0300  07/27/21 RH C/KATIE ALLRED@0302  07/27/21 RH    Enterobacter cloacae complex NOT DETECTED NOT DETECTED Final   Escherichia coli DETECTED (A) NOT DETECTED Final    Comment: CRITICAL RESULT CALLED TO, READ BACK BY AND VERIFIED WITH: NATHAN BLUE@0300  07/27/21 RH C/KATIE ALLRED@0302  07/27/21 RH    Klebsiella aerogenes NOT DETECTED NOT DETECTED Final   Klebsiella oxytoca NOT DETECTED NOT DETECTED Final   Klebsiella pneumoniae NOT DETECTED NOT DETECTED Final   Proteus species NOT DETECTED NOT DETECTED Final   Salmonella species NOT DETECTED NOT DETECTED Final   Serratia marcescens NOT DETECTED NOT DETECTED Final   Haemophilus influenzae NOT DETECTED NOT DETECTED Final   Neisseria meningitidis NOT DETECTED NOT DETECTED Final   Pseudomonas aeruginosa NOT DETECTED NOT DETECTED Final   Stenotrophomonas maltophilia NOT DETECTED NOT DETECTED Final   Candida albicans NOT DETECTED NOT DETECTED Final   Candida auris NOT DETECTED NOT DETECTED Final   Candida glabrata NOT DETECTED NOT DETECTED Final   Candida krusei NOT DETECTED NOT DETECTED Final   Candida parapsilosis NOT DETECTED NOT DETECTED Final   Candida tropicalis NOT DETECTED NOT DETECTED Final   Cryptococcus neoformans/gattii NOT DETECTED NOT DETECTED Final   CTX-M ESBL DETECTED (A) NOT DETECTED Final    Comment: CRITICAL RESULT CALLED TO, READ BACK BY AND VERIFIED WITH: NATHAN BLUE@0300  07/27/21 RH C/KATIE ALLRED@0302  07/27/21 RH (NOTE) Extended spectrum beta-lactamase detected. Recommend a carbapenem as initial therapy.      Carbapenem resistance IMP NOT DETECTED NOT DETECTED Final    Carbapenem resistance KPC NOT DETECTED NOT DETECTED Final   Carbapenem resistance NDM NOT DETECTED NOT DETECTED Final   Carbapenem resist OXA 48 LIKE NOT DETECTED NOT DETECTED Final   Carbapenem resistance VIM NOT DETECTED NOT DETECTED Final    Comment: Performed at [facility], [address] [place], [place] [address]  CULTURE, BLOOD (ROUTINE X 2) w Reflex to ID Panel     Status: None (Preliminary result)   Collection Time: 07/29/21 12:18 AM   Specimen: BLOOD  Result Value Ref Range Status   Specimen Description BLOOD LEFT ANTECUBITAL  Final   Special Requests   Final    BOTTLES DRAWN AEROBIC AND ANAEROBIC Blood Culture adequate volume   Culture   Final    NO GROWTH 2 DAYS Performed at [facility], [address], [place], [place] [address]    Report Status PENDING  Incomplete  CULTURE, BLOOD (ROUTINE X 2) w Reflex to ID Panel     Status: None (Preliminary result)   Collection Time: 07/29/21 12:19 AM   Specimen: BLOOD  Result Value Ref Range Status   Specimen Description BLOOD BLOOD LEFT HAND  Final   Special Requests   Final    BOTTLES DRAWN AEROBIC AND ANAEROBIC Blood Culture adequate volume   Culture   Final    NO GROWTH 2 DAYS Performed at [facility], [address] [place], [place] [address]    Report Status PENDING  Incomplete      Studies: No results found.    Flora Lipps, MD  [facility] 07/31/2021  If 7PM-7AM, please contact night-coverage

## 2021-08-01 DIAGNOSIS — I10 Essential (primary) hypertension: Secondary | ICD-10-CM | POA: Diagnosis not present

## 2021-08-01 DIAGNOSIS — N3000 Acute cystitis without hematuria: Secondary | ICD-10-CM | POA: Diagnosis not present

## 2021-08-01 DIAGNOSIS — R7401 Elevation of levels of liver transaminase levels: Secondary | ICD-10-CM

## 2021-08-01 DIAGNOSIS — B9629 Other Escherichia coli [E. coli] as the cause of diseases classified elsewhere: Secondary | ICD-10-CM | POA: Diagnosis not present

## 2021-08-01 DIAGNOSIS — B9620 Unspecified Escherichia coli [E. coli] as the cause of diseases classified elsewhere: Secondary | ICD-10-CM | POA: Diagnosis not present

## 2021-08-01 DIAGNOSIS — F419 Anxiety disorder, unspecified: Secondary | ICD-10-CM | POA: Diagnosis not present

## 2021-08-01 DIAGNOSIS — N390 Urinary tract infection, site not specified: Secondary | ICD-10-CM | POA: Diagnosis not present

## 2021-08-01 DIAGNOSIS — F3170 Bipolar disorder, currently in remission, most recent episode unspecified: Secondary | ICD-10-CM | POA: Diagnosis not present

## 2021-08-01 DIAGNOSIS — R7881 Bacteremia: Secondary | ICD-10-CM | POA: Diagnosis not present

## 2021-08-01 LAB — COMPREHENSIVE METABOLIC PANEL
ALT: 386 U/L — ABNORMAL HIGH (ref 0–44)
AST: 146 U/L — ABNORMAL HIGH (ref 15–41)
Albumin: 2.1 g/dL — ABNORMAL LOW (ref 3.5–5.0)
Alkaline Phosphatase: 210 U/L — ABNORMAL HIGH (ref 38–126)
Anion gap: 9 (ref 5–15)
BUN: 20 mg/dL (ref 8–23)
CO2: 22 mmol/L (ref 22–32)
Calcium: 8.8 mg/dL — ABNORMAL LOW (ref 8.9–10.3)
Chloride: 106 mmol/L (ref 98–111)
Creatinine, Ser: 0.76 mg/dL (ref 0.44–1.00)
GFR, Estimated: >60 mL/min (ref >60)
Glucose, Bld: 94 mg/dL (ref 70–99)
Potassium: 4.5 mmol/L (ref 3.5–5.1)
Sodium: 137 mmol/L (ref 135–145)
Total Bilirubin: 0.7 mg/dL (ref 0.3–1.2)
Total Protein: 6.4 g/dL — ABNORMAL LOW (ref 6.5–8.1)

## 2021-08-01 MED ORDER — BOOST / RESOURCE BREEZE PO LIQD CUSTOM
1.0000 | Freq: Three times a day (TID) | ORAL | Status: DC
  Administered 2021-08-02 – 2021-08-04 (×7): 1 via ORAL

## 2021-08-01 NOTE — Progress Notes (Signed)
Patient able to sit up in the recliner for 1 hours and assisted back to bed

## 2021-08-01 NOTE — Progress Notes (Signed)
PROGRESS NOTE  MAIGAN BITTINGER JKD:[id number] DOB: Jan 11, 1951 DOA: 07/26/2021 PCP: Leone Haven, MD   LOS: 5 days   Brief narrative:  Mrs. Bastarache is a 70 y.o. F with bipolar disorder, schizophrenia, HTN, hypothyroidism presented hospital with nausea and vomiting, malaise weakness with dysuria, polyuria and urgency on presentation.  In the ED, patient was febrile with leukocytosis and mild thrombocytopenia.  Urinalysis was abnormal.  Patient was started on IV antibiotic and was admitted hospital for further evaluation and treatment.  During hospitalization, patient was noted to have ESBL E. coli bacteremia and UTI with worsening leukocytosis and ID was consulted.  ID recommended continuation of IV meropenem until leukocytosis improved.  She also had elevated LFTs and GI was consulted on 08/01/2021.  Assessment/Plan:  Principal Problem:   UTI (urinary tract infection) Active Problems:   Smoking history   Hyperlipidemia   Essential hypertension   Anxiety   Bipolar disorder (HCC)   Hypothyroidism   Scalp laceration   Sepsis ([place])  Severe sepsis due to ESBL E. coli bacteremia, urinary source Patient presented with thrombocytopenia, elevated LFTs, and bacteremia.  Infectious disease was consulted.  Blood cultures growing ESBL in 2 of 2.  Improving clinically at this time except for leukocytosis and elevated LFTs...  Urine culture showing ESBL.  On meropenem day 6, as per ID recommendation plan is to continue meropenem until leukocytosis is significantly improved.   Repeat blood cultures negative in 3 days from 07/29/2021.   Volume depletion.  Resolved with IV fluids.  Encourage oral.   Elevated LFTs  Avoid hepatotoxins.  Liver ultrasound was unremarkable.  Elevated liver function test.  Continue to monitor.  Continue to hold statin, hold Tylenol.  .  Patient was on Trileptal 300 mg daily and 600 mg at bedtime and was kept on hold yesterday and will hold again today..  Will need to  restart Trileptal by tomorrow if LFTs continue to improve.   Hypothyroidism Continue Synthroid  Schizoaffective disorder On Geodon, oxcarbazepine on hold due case report of elevated LFT-we will need to resume by tomorrow if LFTs trending down, continue lithium  Essential hypertension Continue aspirin, Lipitor, doxazosin.  Amlodipine and propanolol on hold from home.  Latest blood pressure 114/70.  Latest blood pressure of 114 x 70  Hyponatremia Improved.  Latest sodium of 137.   Facial droop/Double vision MRI of the brain was negative for acute stroke.  Double vision has improved.  Patient has mild facial droop at baseline.      DVT prophylaxis: enoxaparin (LOVENOX) injection 40 mg Start: 07/30/21 2200 Place TED hose Start: 07/26/21 1803  Code Status: Full code  Family Communication: None  Status is: Inpatient  Remains inpatient appropriate because:IV treatments appropriate due to intensity of illness or inability to take PO and Inpatient level of care appropriate due to severity of illness  Dispo: The patient is from: Home              Anticipated d/c is to: Home with physical therapy.              Patient currently is not medically stable to d/c.   Difficult to place patient No  Consultants: None  Procedures: None  Anti-infectives:  Meropenem IV  Anti-infectives (From admission, onward)    Start     Dose/Rate Route Frequency Ordered Stop   07/29/21 1000  meropenem (MERREM) 1 g in sodium chloride 0.9 % 100 mL IVPB        1 g 200  mL/hr over 30 Minutes Intravenous Every 8 hours 07/29/21 0754     07/27/21 1800  cefTRIAXone (ROCEPHIN) 2 g in sodium chloride 0.9 % 100 mL IVPB  Status:  Discontinued        2 g 200 mL/hr over 30 Minutes Intravenous Every 24 hours 07/26/21 1804 07/27/21 0722   07/27/21 0730  meropenem (MERREM) 1 g in sodium chloride 0.9 % 100 mL IVPB  Status:  Discontinued        1 g 200 mL/hr over 30 Minutes Intravenous Every 12 hours 07/27/21 0722  07/29/21 0754   07/26/21 1745  cefTRIAXone (ROCEPHIN) 1 g in sodium chloride 0.9 % 100 mL IVPB        1 g 200 mL/hr over 30 Minutes Intravenous  Once 07/26/21 1741 07/26/21 1807      Subjective: Today, patient was seen and examined at bedside.  Patient denies any nausea, vomiting abdominal pain fever or chills.  Objective: Vitals:   08/01/21 0448 08/01/21 0754  BP: 126/73 114/70  Pulse: 76 82  Resp: 18 18  Temp: 98.1 F (36.7 C)   SpO2: 100% 95%    Intake/Output Summary (Last 24 hours) at 08/01/2021 1418 Last data filed at 07/31/2021 1821 Gross per 24 hour  Intake --  Output 1500 ml  Net -1500 ml    Filed Weights   07/26/21 1328  Weight: 63.1 kg   Body mass index is 21.79 kg/m.   Physical Exam:  General:  Average built, not in obvious distress HENT:   No scleral pallor or icterus noted. Oral mucosa is moist.  Chest:  Clear breath sounds.  Diminished breath sounds bilaterally. No crackles or wheezes.  CVS: S1 &S2 heard. No murmur.  Regular rate and rhythm. Abdomen: Soft, nontender, nondistended.  Bowel sounds are heard.   Extremities: Bilateral knee with scabs. Psych: Alert, awake and oriented, normal mood CNS:  No cranial nerve deficits.  Moving all extremities Skin: Warm and dry.    Data Review: I have personally reviewed the following laboratory data and studies,  CBC: Recent Labs  Lab 07/26/21 1330 07/27/21 0724 07/28/21 0526 07/29/21 0458 07/30/21 0429 07/31/21 0447  WBC 19.9* 20.6* 22.5* 28.2* 26.1* 23.2*  NEUTROABS 16.9* 17.7*  --   --   --   --   HGB 11.5* 10.2* 9.6* 11.2* 10.8* 11.1*  HCT 35.3* 30.4* 28.9* 34.0* 31.7* 34.2*  MCV 86.7 85.9 86.5 88.1 85.9 85.3  PLT 98* 98* 114* 151 186 272    Basic Metabolic Panel: Recent Labs  Lab 07/27/21 0724 07/28/21 0526 07/29/21 0458 07/30/21 0429 07/31/21 0447 08/01/21 0812  NA 130* 134* 137 136 139 137  K 4.1 3.7 3.8 3.9 4.4 4.5  CL 102 107 108 109 109 106  CO2 18* 18* 22 21* 21* 22  GLUCOSE  126* 114* 130* 116* 104* 94  BUN 29* 25* 23 22 24* 20  CREATININE 1.06* 1.08* 0.99 1.00 0.71 0.76  CALCIUM 8.7* 8.2* 8.7* 8.5* 8.7* 8.8*  MG 2.1  --   --   --   --   --   PHOS 3.1  --   --   --   --   --     Liver Function Tests: Recent Labs  Lab 07/28/21 0526 07/29/21 0458 07/30/21 0429 07/31/21 0447 08/01/21 0812  AST 202* 241* 294* 280* 146*  ALT 261* 349* 508* 566* 386*  ALKPHOS 166* 215* 211* 254* 210*  BILITOT 0.7 0.6 0.7 0.7 0.7  PROT 5.8*  6.4* 6.2* 6.5 6.4*  ALBUMIN 2.5* 2.6* 2.3* 2.3* 2.1*    Recent Labs  Lab 07/26/21 1330  LIPASE 47    No results for input(s): AMMONIA in the last 168 hours. Cardiac Enzymes: No results for input(s): CKTOTAL, CKMB, CKMBINDEX, TROPONINI in the last 168 hours. BNP (last 3 results) No results for input(s): BNP in the last 8760 hours.  ProBNP (last 3 results) No results for input(s): PROBNP in the last 8760 hours.  CBG: No results for input(s): GLUCAP in the last 168 hours. Recent Results (from the past 240 hour(s))  Resp Panel by RT-PCR (Flu A&B, Covid) Nasopharyngeal Swab     Status: None   Collection Time: 07/26/21  4:15 PM   Specimen: Nasopharyngeal Swab; Nasopharyngeal(NP) swabs in vial transport medium  Result Value Ref Range Status   SARS Coronavirus 2 by RT PCR NEGATIVE NEGATIVE Final    Comment: (NOTE) SARS-CoV-2 target nucleic acids are NOT DETECTED.  The SARS-CoV-2 RNA is generally detectable in upper respiratory specimens during the acute phase of infection. The lowest concentration of SARS-CoV-2 viral copies this assay can detect is 138 copies/mL. A negative result does not preclude SARS-Cov-2 infection and should not be used as the sole basis for treatment or other patient management decisions. A negative result may occur with  improper specimen collection/handling, submission of specimen other than nasopharyngeal swab, presence of viral mutation(s) within the areas targeted by this assay, and inadequate  number of viral copies(<138 copies/mL). A negative result must be combined with clinical observations, patient history, and epidemiological information. The expected result is Negative.  Fact Sheet for Patients:  EntrepreneurPulse.com.au  Fact Sheet for Healthcare Providers:  IncredibleEmployment.be  This test is no t yet approved or cleared by the [place] FDA and  has been authorized for detection and/or diagnosis of SARS-CoV-2 by FDA under an Emergency Use Authorization (EUA). This EUA will remain  in effect (meaning this test can be used) for the duration of the COVID-19 declaration under Section 564(b)(1) of the Act, 21 U.S.C.section 360bbb-3(b)(1), unless the authorization is terminated  or revoked sooner.       Influenza A by PCR NEGATIVE NEGATIVE Final   Influenza B by PCR NEGATIVE NEGATIVE Final    Comment: (NOTE) The Xpert Xpress SARS-CoV-2/FLU/RSV plus assay is intended as an aid in the diagnosis of influenza from Nasopharyngeal swab specimens and should not be used as a sole basis for treatment. Nasal washings and aspirates are unacceptable for Xpert Xpress SARS-CoV-2/FLU/RSV testing.  Fact Sheet for Patients: EntrepreneurPulse.com.au  Fact Sheet for Healthcare Providers: IncredibleEmployment.be  This test is not yet approved or cleared by the [place] FDA and has been authorized for detection and/or diagnosis of SARS-CoV-2 by FDA under an Emergency Use Authorization (EUA). This EUA will remain in effect (meaning this test can be used) for the duration of the COVID-19 declaration under Section 564(b)(1) of the Act, 21 U.S.C. section 360bbb-3(b)(1), unless the authorization is terminated or revoked.  Performed at [facility], [address] [place], [place] [address]   Urine Culture     Status: Abnormal   Collection Time: 07/26/21  5:00 PM   Specimen: Urine, Random   Result Value Ref Range Status   Specimen Description   Final    URINE, RANDOM Performed at [facility], [address] [place], [place] [address]    Special Requests   Final    NONE Performed at [facility], [place], [place]  [address]    Culture (A)  Final    >=100,000 COLONIES/mL ESCHERICHIA COLI Confirmed Extended Spectrum Beta-Lactamase Producer (ESBL).  In bloodstream infections from ESBL organisms, carbapenems are preferred over piperacillin/tazobactam. They are shown to have a lower risk of mortality.    Report Status 07/29/2021 FINAL  Final   Organism ID, Bacteria ESCHERICHIA COLI (A)  Final      Susceptibility   Escherichia coli - MIC*    AMPICILLIN >=32 RESISTANT Resistant     CEFAZOLIN >=64 RESISTANT Resistant     CEFEPIME 4 INTERMEDIATE Intermediate     CEFTRIAXONE >=64 RESISTANT Resistant     CIPROFLOXACIN >=4 RESISTANT Resistant     GENTAMICIN >=16 RESISTANT Resistant     IMIPENEM <=0.25 SENSITIVE Sensitive     NITROFURANTOIN <=16 SENSITIVE Sensitive     TRIMETH/SULFA <=20 SENSITIVE Sensitive     AMPICILLIN/SULBACTAM 4 SENSITIVE Sensitive     PIP/TAZO <=4 SENSITIVE Sensitive     * >=100,000 COLONIES/mL ESCHERICHIA COLI  Blood culture (routine x 2)     Status: Abnormal   Collection Time: 07/26/21  5:50 PM   Specimen: BLOOD  Result Value Ref Range Status   Specimen Description   Final    BLOOD RIGHT ANTECUBITAL Performed at [facility], [address] [place], [place] [address]    Special Requests   Final    BOTTLES DRAWN AEROBIC AND ANAEROBIC Blood Culture results may not be optimal due to an inadequate volume of blood received in culture bottles Performed at [facility], [address] [place], [place] [address]    Culture  Setup Time   Final    GRAM NEGATIVE RODS IN BOTH AEROBIC AND ANAEROBIC BOTTLES CRITICAL VALUE NOTED.  VALUE IS CONSISTENT WITH PREVIOUSLY REPORTED AND CALLED VALUE. Performed  at [facility], [address] [place], [place] [address]    Culture (A)  Final    ESCHERICHIA COLI SUSCEPTIBILITIES PERFORMED ON PREVIOUS CULTURE WITHIN THE LAST 5 DAYS. Performed at [facility], [place] [address] [place], [place] [address]    Report Status 07/29/2021 FINAL  Final  Blood culture (routine x 2)     Status: Abnormal   Collection Time: 07/26/21  5:50 PM   Specimen: BLOOD  Result Value Ref Range Status   Specimen Description   Final    BLOOD BLOOD RIGHT FOREARM Performed at [facility], [address], [place], [place] [address]    Special Requests   Final    BOTTLES DRAWN AEROBIC AND ANAEROBIC Blood Culture results may not be optimal due to an inadequate volume of blood received in culture bottles Performed at [facility], [address], [place], [place] [address]    Culture  Setup Time   Final    GRAM NEGATIVE RODS IN BOTH AEROBIC AND ANAEROBIC BOTTLES CRITICAL RESULT CALLED TO, READ BACK BY AND VERIFIED WITH: NATHAN BLUE@0300  07/27/21 RH Performed at [facility], [place] [address], [place],  [address]    Culture (A)  Final    ESCHERICHIA COLI Confirmed Extended Spectrum Beta-Lactamase Producer (ESBL).  In bloodstream infections from ESBL organisms, carbapenems are preferred over piperacillin/tazobactam. They are shown to have a lower risk of mortality.    Report Status 07/29/2021 FINAL  Final   Organism ID, Bacteria ESCHERICHIA COLI  Final      Susceptibility   Escherichia coli - MIC*    AMPICILLIN >=32 RESISTANT Resistant     CEFAZOLIN >=64 RESISTANT Resistant  CEFEPIME 2 SENSITIVE Sensitive     CEFTAZIDIME RESISTANT Resistant     CEFTRIAXONE >=64 RESISTANT Resistant     CIPROFLOXACIN >=4 RESISTANT Resistant     GENTAMICIN >=16 RESISTANT Resistant     IMIPENEM <=0.25 SENSITIVE Sensitive     TRIMETH/SULFA <=20 SENSITIVE Sensitive     AMPICILLIN/SULBACTAM 4 SENSITIVE Sensitive     PIP/TAZO <=4 SENSITIVE  Sensitive     * ESCHERICHIA COLI  Blood Culture ID Panel (Reflexed)     Status: Abnormal   Collection Time: 07/26/21  5:50 PM  Result Value Ref Range Status   Enterococcus faecalis NOT DETECTED NOT DETECTED Final   Enterococcus Faecium NOT DETECTED NOT DETECTED Final   Listeria monocytogenes NOT DETECTED NOT DETECTED Final   Staphylococcus species NOT DETECTED NOT DETECTED Final   Staphylococcus aureus (BCID) NOT DETECTED NOT DETECTED Final   Staphylococcus epidermidis NOT DETECTED NOT DETECTED Final   Staphylococcus lugdunensis NOT DETECTED NOT DETECTED Final   Streptococcus species NOT DETECTED NOT DETECTED Final   Streptococcus agalactiae NOT DETECTED NOT DETECTED Final   Streptococcus pneumoniae NOT DETECTED NOT DETECTED Final   Streptococcus pyogenes NOT DETECTED NOT DETECTED Final   A.calcoaceticus-baumannii NOT DETECTED NOT DETECTED Final   Bacteroides fragilis NOT DETECTED NOT DETECTED Final   Enterobacterales DETECTED (A) NOT DETECTED Final    Comment: Enterobacterales represent a large order of gram negative bacteria, not a single organism. CRITICAL RESULT CALLED TO, READ BACK BY AND VERIFIED WITH: NATHAN BLUE@0300  07/27/21 RH C/KATIE ALLRED@0302  07/27/21 RH    Enterobacter cloacae complex NOT DETECTED NOT DETECTED Final   Escherichia coli DETECTED (A) NOT DETECTED Final    Comment: CRITICAL RESULT CALLED TO, READ BACK BY AND VERIFIED WITH: NATHAN BLUE@0300  07/27/21 RH C/KATIE ALLRED@0302  07/27/21 RH    Klebsiella aerogenes NOT DETECTED NOT DETECTED Final   Klebsiella oxytoca NOT DETECTED NOT DETECTED Final   Klebsiella pneumoniae NOT DETECTED NOT DETECTED Final   Proteus species NOT DETECTED NOT DETECTED Final   Salmonella species NOT DETECTED NOT DETECTED Final   Serratia marcescens NOT DETECTED NOT DETECTED Final   Haemophilus influenzae NOT DETECTED NOT DETECTED Final   Neisseria meningitidis NOT DETECTED NOT DETECTED Final   Pseudomonas aeruginosa NOT DETECTED NOT  DETECTED Final   Stenotrophomonas maltophilia NOT DETECTED NOT DETECTED Final   Candida albicans NOT DETECTED NOT DETECTED Final   Candida auris NOT DETECTED NOT DETECTED Final   Candida glabrata NOT DETECTED NOT DETECTED Final   Candida krusei NOT DETECTED NOT DETECTED Final   Candida parapsilosis NOT DETECTED NOT DETECTED Final   Candida tropicalis NOT DETECTED NOT DETECTED Final   Cryptococcus neoformans/gattii NOT DETECTED NOT DETECTED Final   CTX-M ESBL DETECTED (A) NOT DETECTED Final    Comment: CRITICAL RESULT CALLED TO, READ BACK BY AND VERIFIED WITH: NATHAN BLUE@0300  07/27/21 RH C/KATIE ALLRED@0302  07/27/21 RH (NOTE) Extended spectrum beta-lactamase detected. Recommend a carbapenem as initial therapy.      Carbapenem resistance IMP NOT DETECTED NOT DETECTED Final   Carbapenem resistance KPC NOT DETECTED NOT DETECTED Final   Carbapenem resistance NDM NOT DETECTED NOT DETECTED Final   Carbapenem resist OXA 48 LIKE NOT DETECTED NOT DETECTED Final   Carbapenem resistance VIM NOT DETECTED NOT DETECTED Final    Comment: Performed at [facility], [address] [place], [place] [address]  CULTURE, BLOOD (ROUTINE X 2) w Reflex to ID Panel     Status: None (Preliminary result)   Collection Time: 07/29/21 12:18 AM   Specimen:  BLOOD  Result Value Ref Range Status   Specimen Description BLOOD LEFT ANTECUBITAL  Final   Special Requests   Final    BOTTLES DRAWN AEROBIC AND ANAEROBIC Blood Culture adequate volume   Culture   Final    NO GROWTH 3 DAYS Performed at [facility], [address], [place], [place] [address]    Report Status PENDING  Incomplete  CULTURE, BLOOD (ROUTINE X 2) w Reflex to ID Panel     Status: None (Preliminary result)   Collection Time: 07/29/21 12:19 AM   Specimen: BLOOD  Result Value Ref Range Status   Specimen Description BLOOD BLOOD LEFT HAND  Final   Special Requests   Final    BOTTLES DRAWN AEROBIC AND ANAEROBIC Blood Culture  adequate volume   Culture   Final    NO GROWTH 3 DAYS Performed at [facility], [address], [place], [place] [address]    Report Status PENDING  Incomplete      Studies: No results found.    Flora Lipps, MD  [facility] 08/01/2021  If 7PM-7AM, please contact night-coverage

## 2021-08-01 NOTE — Progress Notes (Signed)
Mobility Specialist - Progress Note   08/01/21 1600  Mobility  Activity Ambulated in hall  Level of Assistance Modified independent, requires aide device or extra time  Ceiba wheel walker  Distance Ambulated (ft) 180 ft  Mobility Ambulated with assistance in hallway  Mobility Response Tolerated well  Mobility performed by Mobility specialist  $Mobility charge 1 Mobility    Pt ambulated in hallway with RW, per pt request. No LOB. Very mild sway. Transferred to [facility] prior to activity. No complaints.    Kathee Delton Mobility Specialist 08/01/21, 4:59 PM

## 2021-08-01 NOTE — Progress Notes (Signed)
Order received from Dr Louanne Belton to reorder contact isolation

## 2021-08-01 NOTE — Consult Note (Signed)
Cephas Darby, MD [address]  [place]  [place], [place] [address]  Main: [phone]  Fax: [phone] Pager: [phone]   Consultation  Referring Provider:     No ref. provider found Primary Care Physician:  Leone Haven, MD Primary Gastroenterologist:   Althia Forts       Reason for Consultation:     Elevated LFTs  Date of Admission:  07/26/2021 Date of Consultation:  08/01/2021         HPI:   Amy Mcconnell is a 70 y.o. female is admitted with ESBL E. coli UTI currently treated with meropenem.  GI is consulted for worsening transaminases since admission.  Her LFTs peaked at alkaline phosphatase 254, AST 294, ALT 508, total bilirubin 0.7 yesterday.  Her LFTs are already improving today.  Patient denies any right upper quadrant discomfort, has been afebrile.  She denies any nausea or vomiting.  She is tolerating p.o. well.  She denies abdominal pain as well.  Reports having good bowel movement.  Ultrasound right upper quadrant was unremarkable. Viral hepatitis panel for hepatitis A, B and C were negative, HIV nonreactive.  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 Thurmond Butts - every 3 months   Broken foot    left    Contact dermatitis and other eczema due to other specified agent    Contusion of unspecified part of lower limb    Essential hypertension, benign    Heart murmur    Other and unspecified hyperlipidemia    Other specified disorders of thyroid    Reflux esophagitis    Tachycardia    a. isolated episode, seen in ED with negative work up    Past Surgical History:  Procedure Laterality Date   CATARACT EXTRACTION     SALPINGECTOMY Left    STERILIZATION     Her decision since dz of  bipolar    Prior to Admission medications   Medication Sig Start Date End Date Taking? Authorizing Provider  acetaminophen (TYLENOL) 650 MG CR tablet Take 650 mg by mouth 2 (two) times daily.   Yes [provider]  amLODipine (NORVASC) 5 MG tablet Take 1 tablet by mouth once daily 01/23/21  Yes Gollan, Kathlene November, MD  Ascorbic Acid (VITAMIN C) 1000 MG tablet Take 1,000 mg by mouth daily.   Yes [provider]  aspirin 81 MG tablet Take 81 mg by mouth daily.   Yes [provider]  B Complex Vitamins (VITAMIN B COMPLEX PO) Take 1 tablet by mouth daily.   Yes [provider]  Biotin 5 MG CAPS Take 5 mg by mouth daily.   Yes [provider]  calcium carbonate (OS-CAL) 600 MG TABS tablet Take 600 mg by mouth daily.    Yes [provider]  Cholecalciferol (VITAMIN D3) 50 MCG (2000 UT) capsule Take 2,000 Units by mouth daily.   Yes [provider]  diphenhydrAMINE (BENADRYL) 25 mg capsule Take 25 mg by mouth daily as needed for allergies.   Yes [provider]  docusate sodium (COLACE) 100 MG capsule Take 100 mg by mouth daily.   Yes [provider]  doxazosin (CARDURA) 8 MG tablet Take 1  tablet by mouth twice daily 02/13/21  Yes Gollan, Kathlene November, MD  EPINEPHrine 0.3 mg/0.3 mL IJ SOAJ injection Inject 0.3 mLs (0.3 mg total) into the muscle once. 03/30/16  Yes Leone Haven, MD  fexofenadine (ALLEGRA) 180 MG tablet Take 180 mg by mouth daily as needed for allergies.   Yes [provider]  Flaxseed, Linseed, (FLAXSEED OIL PO) Take 1 capsule by mouth daily.   Yes [provider]  levothyroxine (SYNTHROID, LEVOTHROID) 125 MCG tablet Take 125 mcg by mouth daily before breakfast.   Yes [provider]  lithium carbonate (LITHOBID) 300 MG CR tablet Take 600 mg by mouth at bedtime.   Yes [provider]  magnesium oxide (MAG-OX) 400 MG tablet Take 400 mg by mouth daily.   Yes [provider]  Multiple Vitamins-Minerals (MULTIVITAMIN WITH MINERALS) tablet Take 1 tablet by mouth daily.   Yes [provider]  Multiple Vitamins-Minerals (VISION FORMULA/LUTEIN) TABS Take 1 tablet by mouth in the morning and at bedtime.   Yes [provider]  Omega-3 Fatty Acids (FISH OIL) 1000 MG CAPS Take 3,000 mg by mouth daily.   Yes [provider]  Oxcarbazepine (TRILEPTAL) 300 MG tablet Take 300-600 mg by mouth See admin instructions. Take 1 tablet (335m) by mouth every morning and take 2 tablets (6074m by mouth every night at bedtime   Yes [provider]  propranolol ER (INDERAL LA) 60 MG 24 hr capsule Take 1 capsule by mouth once daily 12/21/20  Yes Gollan, TiKathlene NovemberMD  vitamin B-12 (CYANOCOBALAMIN) 1000 MCG tablet Take 1,000 mcg by mouth daily.   Yes [provider]  vitamin E 1000 UNIT capsule Take 1,000 Units by mouth daily.   Yes [provider]  zinc gluconate 50 MG tablet Take 50 mg by mouth daily.   Yes [provider]  ziprasidone (GEODON) 20 MG capsule Take 40 mg by mouth at bedtime.   Yes [provider]  atorvastatin (LIPITOR) 40 MG tablet Take 1 tablet (40 mg total) by mouth daily. 07/24/21   SoLeone HavenMD    Current Facility-Administered Medications:    ascorbic acid (VITAMIN C) tablet 1,000 mg, 1,000 mg, Oral, Daily, Cox, Amy N, DO, 1,000 mg at 08/01/21 0913   aspirin EC tablet 81 mg, 81 mg, Oral, Daily, Cox, Amy N, DO, 81 mg at 08/01/21 0913   docusate sodium (COLACE) capsule 100 mg, 100 mg, Oral, Daily, Cox, Amy N, DO, 100 mg at 08/01/21 0913   doxazosin (CARDURA) tablet 8 mg, 8 mg, Oral, BID, BeRenda RollsRPH, 8 mg at 08/01/21 0914   enoxaparin (LOVENOX) injection 40 mg, 40 mg, Subcutaneous, Q24H, Pokhrel, Laxman, MD, 40 mg at 07/31/21 2142   feeding supplement (BOOST / RESOURCE BREEZE) liquid 1 Container, 1 Container, Oral, TID BM, Dharma Pare, RoTally DueMD   ibuprofen (ADVIL)  tablet 400 mg, 400 mg, Oral, Q6H PRN, Pokhrel, Laxman, MD, 400 mg at 08/01/21 0505   levothyroxine (SYNTHROID) tablet 125 mcg, 125 mcg, Oral, QAC breakfast, Cox, Amy N, DO, 125 mcg at 08/01/21 0505   lithium carbonate (LITHOBID) CR tablet 600 mg, 600 mg, Oral, QHS, Cox, Amy N, DO, 600 mg at 07/31/21 2142   meropenem (MERREM) 1 g in sodium chloride 0.9 % 100 mL IVPB, 1 g, Intravenous, Q8H, Chappell, Alex B, RPH, Last Rate: 200 mL/hr at 08/01/21 1830, 1 g at 08/01/21 1830   omega-3 acid ethyl esters (LOVAZA) capsule 3,000 mg, 3,000 mg,  Oral, Daily, Cox, Amy N, DO, 3,000 mg at 08/01/21 0914   ondansetron (ZOFRAN) tablet 4 mg, 4 mg, Oral, Q6H PRN **OR** ondansetron (ZOFRAN) injection 4 mg, 4 mg, Intravenous, Q6H PRN, Cox, Amy N, DO   oxyCODONE (Oxy IR/ROXICODONE) immediate release tablet 5 mg, 5 mg, Oral, Q6H PRN, Danford, Suann Larry, MD   pantoprazole (PROTONIX) EC tablet 40 mg, 40 mg, Oral, Daily, Pokhrel, Laxman, MD, 40 mg at 08/01/21 0913   vitamin B-12 (CYANOCOBALAMIN) tablet 1,000 mcg, 1,000 mcg, Oral, Daily, Cox, Amy N, DO, 1,000 mcg at 08/01/21 0913   vitamin E capsule 1,000 Units, 1,000 Units, Oral, Daily, Cox, Amy N, DO, 1,000 Units at 08/01/21 0914   ziprasidone (GEODON) capsule 40 mg, 40 mg, Oral, QHS, Cox, Amy N, DO, 40 mg at 07/31/21 2142  Family History  Problem Relation Age of Onset   Arrhythmia Mother        A-Fib   Heart failure Mother    Hyperlipidemia Mother    Hypertension Mother    Alzheimer's disease Mother    Hypertension Father    Hyperlipidemia Father    Mental retardation Father 47       Suicide   Hypertension Brother    Hyperlipidemia Brother    Hypertension Brother    Hyperlipidemia Brother    Hyperlipidemia Brother    Hypertension Brother    Alcohol abuse Brother    Depression Brother    Hyperlipidemia Brother    Hypertension Brother    Breast cancer Maternal Aunt      Social History   Tobacco Use   Smoking status: Former    Packs/day: 0.50     Years: 13.00    Pack years: 6.50    Types: Cigarettes    Quit date: 01/23/2000    Years since quitting: 21.5   Smokeless tobacco: Never  Vaping Use   Vaping Use: Never used  Substance Use Topics   Alcohol use: No   Drug use: No    Allergies as of 07/26/2021 - Review Complete 07/26/2021  Allergen Reaction Noted   Ace inhibitors  12/14/2014   Angiotensin receptor blockers  12/14/2014   Benicar [olmesartan]  01/20/2014   Poison ivy extract [poison ivy extract]  01/20/2014   Purell instant hand [alcohol] Swelling 11/06/2017   Triclosan  03/30/2015    Review of Systems:    All systems reviewed and negative except where noted in HPI.   Physical Exam:  Vital signs in last 24 hours: Temp:  [97.9 F (36.6 C)-98.1 F (36.7 C)] 98.1 F (36.7 C) (09/27 0448) Pulse Rate:  [76-85] 85 (09/27 1550) Resp:  [18] 18 (09/27 1550) BP: (114-131)/(70-78) 124/78 (09/27 1550) SpO2:  [95 %-100 %] 97 % (09/27 1550) Last BM Date: 07/31/21 General:   Pleasant, cooperative in NAD Head:  Normocephalic and atraumatic. Eyes:   No icterus.   Conjunctiva pink. PERRLA. Ears:  Normal auditory acuity. Neck:  Supple; no masses or thyroidomegaly Lungs: Respirations even and unlabored. Lungs clear to auscultation bilaterally.   No wheezes, crackles, or rhonchi.  Heart:  Regular rate and rhythm;  Without murmur, clicks, rubs or gallops Abdomen:  Soft, nondistended, nontender. Normal bowel sounds. No appreciable masses or hepatomegaly.  No rebound or guarding.  Rectal:  Not performed. Msk:  Symmetrical without gross deformities.  Strength generalized weakness Extremities:  Without edema, cyanosis or clubbing. Neurologic:  Alert and oriented x3;  grossly normal neurologically. Skin:  Intact without significant lesions or rashes. Psych:  Alert and  cooperative. Normal affect.  LAB RESULTS: CBC Latest Ref Rng & Units 07/31/2021 07/30/2021 07/29/2021  WBC 4.0 - 10.5 K/uL 23.2(H) 26.1(H) 28.2(H)  Hemoglobin 12.0 -  15.0 g/dL 11.1(L) 10.8(L) 11.2(L)  Hematocrit 36.0 - 46.0 % 34.2(L) 31.7(L) 34.0(L)  Platelets 150 - 400 K/uL 229 186 151    BMET BMP Latest Ref Rng & Units 08/01/2021 07/31/2021 07/30/2021  Glucose 70 - 99 mg/dL 94 104(H) 116(H)  BUN 8 - 23 mg/dL 20 24(H) 22  Creatinine 0.44 - 1.00 mg/dL 0.76 0.71 1.00  Sodium 135 - 145 mmol/L 137 139 136  Potassium 3.5 - 5.1 mmol/L 4.5 4.4 3.9  Chloride 98 - 111 mmol/L 106 109 109  CO2 22 - 32 mmol/L 22 21(L) 21(L)  Calcium 8.9 - 10.3 mg/dL 8.8(L) 8.7(L) 8.5(L)    LFT Hepatic Function Latest Ref Rng & Units 08/01/2021 07/31/2021 07/30/2021  Total Protein 6.5 - 8.1 g/dL 6.4(L) 6.5 6.2(L)  Albumin 3.5 - 5.0 g/dL 2.1(L) 2.3(L) 2.3(L)  AST 15 - 41 U/L 146(H) 280(H) 294(H)  ALT 0 - 44 U/L 386(H) 566(H) 508(H)  Alk Phosphatase 38 - 126 U/L 210(H) 254(H) 211(H)  Total Bilirubin 0.3 - 1.2 mg/dL 0.7 0.7 0.7  Bilirubin, Direct 0.0 - 0.2 mg/dL - <0.1 -     STUDIES: No results found.    Impression / Plan:   Amy Mcconnell is a 70 y.o. pleasant Caucasian female is admitted with sepsis secondary to ESBL E. coli UTI, currently on meropenem.  GI is consulted for worsening LFTs  Elevated LFTs Most likely in the setting of sepsis, infection Viral hepatitis panel negative Right upper quadrant ultrasound normal No suspicion for biliary obstruction or cholangitis or acalculous cholecystitis LFTs are improving Encourage adequate hydration Avoid hepatotoxic agents Expect LFTs to improve as infection resolves  Thank you for involving me in the care of this patient.  Please call GI back with questions or concerns    LOS: 5 days   Sherri Sear, MD  08/01/2021, 7:19 PM    Note: This dictation was prepared with Dragon dictation along with smaller phrase technology. Any transcriptional errors that result from this process are unintentional.

## 2021-08-02 DIAGNOSIS — R7401 Elevation of levels of liver transaminase levels: Secondary | ICD-10-CM | POA: Diagnosis present

## 2021-08-02 DIAGNOSIS — R6520 Severe sepsis without septic shock: Secondary | ICD-10-CM

## 2021-08-02 LAB — COMPREHENSIVE METABOLIC PANEL
ALT: 390 U/L — ABNORMAL HIGH (ref 0–44)
AST: 109 U/L — ABNORMAL HIGH (ref 15–41)
Albumin: 2.4 g/dL — ABNORMAL LOW (ref 3.5–5.0)
Alkaline Phosphatase: 225 U/L — ABNORMAL HIGH (ref 38–126)
Anion gap: 9 (ref 5–15)
BUN: 18 mg/dL (ref 8–23)
CO2: 24 mmol/L (ref 22–32)
Calcium: 8.9 mg/dL (ref 8.9–10.3)
Chloride: 103 mmol/L (ref 98–111)
Creatinine, Ser: 0.72 mg/dL (ref 0.44–1.00)
GFR, Estimated: >60 mL/min (ref >60)
Glucose, Bld: 96 mg/dL (ref 70–99)
Potassium: 4.4 mmol/L (ref 3.5–5.1)
Sodium: 136 mmol/L (ref 135–145)
Total Bilirubin: 0.6 mg/dL (ref 0.3–1.2)
Total Protein: 6.5 g/dL (ref 6.5–8.1)

## 2021-08-02 LAB — CBC
HCT: 32.2 % — ABNORMAL LOW (ref 36.0–46.0)
Hemoglobin: 10.9 g/dL — ABNORMAL LOW (ref 12.0–15.0)
MCH: 29.3 pg (ref 26.0–34.0)
MCHC: 33.9 g/dL (ref 30.0–36.0)
MCV: 86.6 fL (ref 80.0–100.0)
Platelets: 251 10*3/uL (ref 150–400)
RBC: 3.72 MIL/uL — ABNORMAL LOW (ref 3.87–5.11)
RDW: 15.6 % — ABNORMAL HIGH (ref 11.5–15.5)
WBC: 17.6 10*3/uL — ABNORMAL HIGH (ref 4.0–10.5)
nRBC: 0 % (ref 0.0–0.2)

## 2021-08-02 LAB — MAGNESIUM: Magnesium: 2.2 mg/dL (ref 1.7–2.4)

## 2021-08-02 MED ORDER — VITAMIN E 45 MG (100 UNIT) PO CAPS
1000.0000 [IU] | ORAL_CAPSULE | Freq: Every day | ORAL | Status: DC
  Administered 2021-08-02 – 2021-08-04 (×3): 1000 [IU] via ORAL
  Filled 2021-08-02 (×3): qty 2

## 2021-08-02 NOTE — Assessment & Plan Note (Signed)
due to ESBL E. coli bacteremia, urinary source Patient presented with thrombocytopenia, elevated LFTs, and bacteremia. Infectious disease was consulted.  Blood cultures growing ESBL in 2 of 2.  -Continue meropenem - Trend CBC

## 2021-08-02 NOTE — Progress Notes (Signed)
PT Cancellation Note  Patient Details Name: Amy Mcconnell MRN: [id number] DOB: July 26, 1951   Cancelled Treatment:    Reason Eval/Treat Not Completed: Medical issues which prohibited therapy. Patient reports she is having withdrawls from medications and is having cramping. Declines PT at this time. Will continue to monitor and see as appropriate.     Harriette Tovey 08/02/2021, 3:26 PM

## 2021-08-02 NOTE — Assessment & Plan Note (Signed)
Continue levothyroxine 

## 2021-08-02 NOTE — Progress Notes (Signed)
Progress Note    Amy Mcconnell   QAS:[id number]  DOB: 08/27/1951  DOA: 07/26/2021     6 Date of Service: 08/02/2021     Subjective:  No new fever, chest pain, abdominal pain, flank pain.  No confusion.  Hospital Problems Severe sepsis ([place]) due to ESBL E. coli bacteremia, urinary source Patient presented with thrombocytopenia, elevated LFTs, and bacteremia. Infectious disease was consulted.  Blood cultures growing ESBL in 2 of 2.  -Continue meropenem - Trend CBC  Hypothyroidism - Continue levothyroxine  Bipolar disorder ([facility]) - Hold Geodon, Trileptal - Continue lithium  Essential hypertension -Hold home BP meds beta-blocker and amlodipine - Continue aspirin, Lipitor, doxazosin  Transaminitis LFTs trending down.  Likely ischemic due to sepsis - Hold Trileptal, statin, Tylenol     Objective Vital signs were reviewed and unremarkable.  Vitals:   08/02/21 0432 08/02/21 0755 08/02/21 1611 08/02/21 2005  BP: 127/74 129/76 125/71 125/79  Pulse: 80 79 78 83  Resp: 18 20 20 18   Temp:  98.1 F (36.7 C) 98.2 F (36.8 C) 98.2 F (36.8 C)  TempSrc:  Oral Oral   SpO2: 98% 97% 97% 98%  Weight:      Height:       63.1 kg  Exam Physical Exam Constitutional:      General: She is not in acute distress. HENT:     Nose: No nasal deformity or rhinorrhea.     Mouth/Throat:     Lips: Pink. No lesions.     Mouth: Mucous membranes are moist. No oral lesions.     Dentition: Normal dentition.     Pharynx: Oropharynx is clear. No posterior oropharyngeal erythema.  Eyes:     General: Lids are normal. Gaze aligned appropriately.     Extraocular Movements: Extraocular movements intact.     Conjunctiva/sclera: Conjunctivae normal.  Cardiovascular:     Rate and Rhythm: Normal rate and regular rhythm.     Pulses:          Radial pulses are 2+ on the right side and 2+ on the left side.     Heart sounds: Normal heart sounds, S1 normal and S2 normal. No murmur  heard. Pulmonary:     Effort: Pulmonary effort is normal. No respiratory distress.     Breath sounds: No wheezing or rales.  Abdominal:     General: There is no distension.     Palpations: Abdomen is soft.     Tenderness: There is no abdominal tenderness. There is no guarding or rebound.  Musculoskeletal:     Right lower leg: No edema.     Left lower leg: No edema.  Skin:    General: Skin is warm and dry.     Findings: No lesion or rash.  Neurological:     Mental Status: She is alert and oriented to person, place, and time.     Cranial Nerves: Cranial nerves are intact.     Motor: No weakness.  Psychiatric:        Attention and Perception: Attention normal.        Mood and Affect: Mood and affect normal.        Behavior: Behavior is cooperative.        Cognition and Memory: Memory normal.        Judgment: Judgment normal.       Labs / Other Information My review of labs, imaging, notes and other tests is significant for Improving LFTs, elevated white count  Time spent: 25 minutes [facility] 08/02/2021, 9:10 PM

## 2021-08-02 NOTE — Assessment & Plan Note (Addendum)
-  Hold home BP meds beta-blocker and amlodipine - Continue aspirin, Lipitor, doxazosin

## 2021-08-02 NOTE — Assessment & Plan Note (Signed)
LFTs trending down.  Likely ischemic due to sepsis - Hold Trileptal, statin, Tylenol

## 2021-08-02 NOTE — Assessment & Plan Note (Signed)
-   Hold Geodon, Trileptal - Continue lithium

## 2021-08-03 DIAGNOSIS — A498 Other bacterial infections of unspecified site: Secondary | ICD-10-CM | POA: Diagnosis not present

## 2021-08-03 DIAGNOSIS — N390 Urinary tract infection, site not specified: Secondary | ICD-10-CM | POA: Diagnosis not present

## 2021-08-03 DIAGNOSIS — R7401 Elevation of levels of liver transaminase levels: Secondary | ICD-10-CM | POA: Diagnosis not present

## 2021-08-03 DIAGNOSIS — E871 Hypo-osmolality and hyponatremia: Secondary | ICD-10-CM | POA: Diagnosis present

## 2021-08-03 DIAGNOSIS — R7881 Bacteremia: Secondary | ICD-10-CM | POA: Diagnosis not present

## 2021-08-03 LAB — COMPREHENSIVE METABOLIC PANEL
ALT: 302 U/L — ABNORMAL HIGH (ref 0–44)
AST: 95 U/L — ABNORMAL HIGH (ref 15–41)
Albumin: 2.5 g/dL — ABNORMAL LOW (ref 3.5–5.0)
Alkaline Phosphatase: 212 U/L — ABNORMAL HIGH (ref 38–126)
Anion gap: 6 (ref 5–15)
BUN: 21 mg/dL (ref 8–23)
CO2: 25 mmol/L (ref 22–32)
Calcium: 8.8 mg/dL — ABNORMAL LOW (ref 8.9–10.3)
Chloride: 105 mmol/L (ref 98–111)
Creatinine, Ser: 0.72 mg/dL (ref 0.44–1.00)
GFR, Estimated: >60 mL/min (ref >60)
Glucose, Bld: 93 mg/dL (ref 70–99)
Potassium: 4.2 mmol/L (ref 3.5–5.1)
Sodium: 136 mmol/L (ref 135–145)
Total Bilirubin: 0.7 mg/dL (ref 0.3–1.2)
Total Protein: 6.6 g/dL (ref 6.5–8.1)

## 2021-08-03 LAB — CULTURE, BLOOD (ROUTINE X 2)
Culture: NO GROWTH
Culture: NO GROWTH
Special Requests: ADEQUATE
Special Requests: ADEQUATE

## 2021-08-03 LAB — CBC
HCT: 32.9 % — ABNORMAL LOW (ref 36.0–46.0)
Hemoglobin: 10.6 g/dL — ABNORMAL LOW (ref 12.0–15.0)
MCH: 28.3 pg (ref 26.0–34.0)
MCHC: 32.2 g/dL (ref 30.0–36.0)
MCV: 88 fL (ref 80.0–100.0)
Platelets: 258 10*3/uL (ref 150–400)
RBC: 3.74 MIL/uL — ABNORMAL LOW (ref 3.87–5.11)
RDW: 15.6 % — ABNORMAL HIGH (ref 11.5–15.5)
WBC: 13.8 10*3/uL — ABNORMAL HIGH (ref 4.0–10.5)
nRBC: 0 % (ref 0.0–0.2)

## 2021-08-03 MED ORDER — SULFAMETHOXAZOLE-TRIMETHOPRIM 800-160 MG PO TABS
1.0000 | ORAL_TABLET | Freq: Two times a day (BID) | ORAL | Status: DC
  Administered 2021-08-04: 1 via ORAL
  Filled 2021-08-03 (×2): qty 1

## 2021-08-03 NOTE — Assessment & Plan Note (Signed)
due to ESBL E. coli bacteremia, urinary source Patient presented with thrombocytopenia, elevated LFTs, and bacteremia. Infectious disease was consulted.  Blood cultures growing ESBL in 2 of 2.  -Continue meropenem, day 8 of 10 - Trend CBC

## 2021-08-03 NOTE — Progress Notes (Addendum)
  Progress Note    Amy Mcconnell   HKV:[id number]  DOB: 01/09/1951  DOA: 07/26/2021     7 Date of Service: 08/03/2021    Brief summary: Amy Mcconnell is a 70 y.o. F with bipolar disorder, HTN, andhypothyroidism who presented with nausea and vomiting for several days.   She had been sick at home for several days with malaise, nausea, weakness, and vomiting as well as dysuria, polyuria and urgency.  Finally she fell again at home, couldn't get up, so family brought her to the ER, where she was febrile, WBC 19.9K, platelets less than 100K.  Urinalysis suggested infection.  Started on antibiotics and admitted for severe sepsis.   Cultures grew ESBL in 2/2.  ID consulted, transitioned to Meropenem.      Subjective:  Little bit of headache today, some cramps in her legs, but no fever, confusion, elevated mood, agitation.  No dysuria or urinary symptoms.  No flank pain, vomiting.  Hospital Problems Severe sepsis ([place]) due to ESBL E. coli bacteremia, urinary source Patient presented with thrombocytopenia, elevated LFTs, and bacteremia.  Infectious disease was consulted.  Blood cultures growing ESBL in 2 of 2.  -Continue meropenem, day 8 of 10 - Trend CBC  Hypothyroidism - Continue levothyroxine  Bipolar disorder (HCC) - Hold Geodon, Trileptal until LFTs improve - Continue lithium  Essential hypertension - Hold home BP meds beta-blocker and amlodipine and Lipitor - Continue aspirin, doxazosin  Transaminitis LFTs trending down.  Likely related to sepsis.  GI consulted.  Hepatitis serologies normal.  With normal Tbili and exam, doubt cholangitis.  No acetaminophen. - Trend LFTs - Hold Trileptal, statin, Tylenol  Normocytic anemia Hgb stable   Facial droop/Leg weakness/Double vision Initially, patient had some neurological symptoms.  MRI brain was obtained and this was normal, ruled out stroke.  The symptoms resolved completely with treatment of her underlying  infection.         Objective Vital signs were reviewed and unremarkable.  Vitals:   08/02/21 2005 08/03/21 0447 08/03/21 0837 08/03/21 1536  BP: 125/79 121/71 128/71 113/63  Pulse: 83 95 98 86  Resp: 18 17 18 18   Temp: 98.2 F (36.8 C) 98.2 F (36.8 C) 98 F (36.7 C) 99 F (37.2 C)  TempSrc:   Oral Oral  SpO2: 98% 98% 98% 96%  Weight:      Height:       63.1 kg  Exam General appearance: Elderly adult female, lying in bed, no acute distress, interactive    HEENT:    Skin:  Cardiac: RRR, no murmurs, no lower extremity edema, no JVD Respiratory: Normal respiratory rate and rhythm, lungs clear without rales or wheezes Abdomen: Abdomen soft without tenderness palpation or guarding, no ascites or distention.  No CVA tenderness. MSK:  Neuro: Awake and alert, extraocular movements intact, moves all extremities with generalized weakness but symmetric strength, speech fluent, face symmetric now. Psych: Awake and alert, oriented to person, place, time, and situation, no pressured speech, no elevated mood, affect appropriate, judgment and insight appear normal    Labs / Other Information My review of labs, imaging, notes and other tests is significant for Hemoglobin 10, stable, LFTs trending down, WBC 13 K.     Time spent: 25 minutes [facility] 08/03/2021, 5:51 PM

## 2021-08-03 NOTE — Progress Notes (Signed)
Physical Therapy Treatment Patient Details Name: Amy Mcconnell MRN: [id number] DOB: [birth year] Today's Date: 08/03/2021   History of Present Illness Amy Mcconnell is a 70 y.o. female with medical history significant for hyperlipidemia, hypothyroid, history of bipolar, history of schizophrenia, anxiety/depression, hyperlipidemia, hypertension, history of smoking, presents emergency department for chief concerns of nausea and vomiting.    PT Comments    Pt was pleasant and motivated to participate during the session and overall performed well and made good progress functionally.  Pt was able to amb 150' with a RW with good stability and fair cadence.  Pt then was able to ambulate another 150' with light one hand HHA with minor drifting left/right and minor instability that the pt was able to self-correct.  Pt was anxious initially during balance training per below secondary to mild instability leading to fear of falling but pt put forth good effort throughout.  Pt will benefit from HHPT upon discharge to safely address deficits listed in patient problem list for decreased caregiver assistance and eventual return to PLOF.     Recommendations for follow up therapy are one component of a multi-disciplinary discharge planning process, led by the attending physician.  Recommendations may be updated based on patient status, additional functional criteria and insurance authorization.  Follow Up Recommendations  Home health PT;Supervision for mobility/OOB     Equipment Recommendations  None recommended by PT    Recommendations for Other Services       Precautions / Restrictions Precautions Precautions: Fall Restrictions Weight Bearing Restrictions: No     Mobility  Bed Mobility Overal bed mobility: Modified Independent             General bed mobility comments: Min extra time and effort only    Transfers Overall transfer level: Modified independent Equipment used: Rolling  walker (2 wheeled)             General transfer comment: Good eccentric and concentric control and stability  Ambulation/Gait Ambulation/Gait assistance: Supervision Gait Distance (Feet): 150 Feet x 2 Assistive device: Rolling walker (2 wheeled);1 person hand held assist Gait Pattern/deviations: Step-through pattern;Decreased step length - right;Decreased step length - left;Drifts right/left Gait velocity: decreased   General Gait Details: Slow cadence with good stability with a RW and fair stability with mild drifting left/right with one hand HHA.  Pt placed only minimal force through single HHA and was able to self-correct mild instability without assist   Stairs             Wheelchair Mobility    Modified Rankin (Stroke Patients Only)       Balance Overall balance assessment: Needs assistance   Sitting balance-Leahy Scale: Normal     Standing balance support: No upper extremity supported Standing balance-Leahy Scale: Fair Standing balance comment: Pt with mild instability during balance training most notably during semi-tandem stance with head turns                            Cognition Arousal/Alertness: Awake/alert Behavior During Therapy: WFL for tasks assessed/performed Overall Cognitive Status: Within Functional Limits for tasks assessed                                        Exercises Total Joint Exercises Ankle Circles/Pumps: AROM;Strengthening;10 reps Quad Sets: Strengthening;Both;10 reps Heel Slides: Strengthening;Both;5 reps Straight Leg Raises: Strengthening;Both;10 reps  Other Exercises Other Exercises: Static standing balance training without UE support with feet apart, together, and semi-tandem with combinations of head still/head turns to pt's tolerance    General Comments        Pertinent Vitals/Pain Pain Assessment: Faces Pain Score: 2  Faces Pain Scale: Hurts a little bit Pain Location: IV sites on  BUEs Pain Descriptors / Indicators: Sore Pain Intervention(s): Monitored during session    Home Living                      Prior Function            PT Goals (current goals can now be found in the care plan section) Progress towards PT goals: Progressing toward goals    Frequency    Min 2X/week      PT Plan Current plan remains appropriate    Co-evaluation              AM-PAC PT "6 Clicks" Mobility   Outcome Measure  Help needed turning from your back to your side while in a flat bed without using bedrails?: None Help needed moving from lying on your back to sitting on the side of a flat bed without using bedrails?: None Help needed moving to and from a bed to a chair (including a wheelchair)?: None Help needed standing up from a chair using your arms (e.g., wheelchair or bedside chair)?: None Help needed to walk in hospital room?: A Little Help needed climbing 3-5 steps with a railing? : A Little 6 Click Score: 22    End of Session Equipment Utilized During Treatment: Gait belt Activity Tolerance: Patient tolerated treatment well Patient left: in bed;Other (comment) (Pt left with OT at end of session for OT evaluation) Nurse Communication: Mobility status PT Visit Diagnosis: Unsteadiness on feet (R26.81)     Time: 8250-0370 PT Time Calculation (min) (ACUTE ONLY): 23 min  Charges:  $Gait Training: 8-22 mins $Therapeutic Exercise: 8-22 mins                     D. Scott Jyasia Markoff PT, DPT 08/03/21, 2:12 PM

## 2021-08-03 NOTE — Progress Notes (Signed)
ID Pt feeling better No pain Appetite still poor Feels like she is having withdrawal from her bipolar meds- both Geodon and trileptal on hold because of abnormal LFTS   O/e awake alert Patient Vitals for the past 24 hrs:  BP Temp Temp src Pulse Resp SpO2  08/03/21 1958 129/75 98 F (36.7 C) Oral 85 16 99 %  08/03/21 1536 113/63 99 F (37.2 C) Oral 86 18 96 %  08/03/21 0837 128/71 98 F (36.7 C) Oral 98 18 98 %  08/03/21 0447 121/71 98.2 F (36.8 C) -- 95 17 98 %    Dystonia of the face has resolved completely Slurred speech and dystonia tongue has resolved Chest CTA Hss1s Abd soft CNS non focal  Labs CBC Latest Ref Rng & Units 08/03/2021 08/02/2021 07/31/2021  WBC 4.0 - 10.5 K/uL 13.8(H) 17.6(H) 23.2(H)  Hemoglobin 12.0 - 15.0 g/dL 10.6(L) 10.9(L) 11.1(L)  Hematocrit 36.0 - 46.0 % 32.9(L) 32.2(L) 34.2(L)  Platelets 150 - 400 K/uL 258 251 229     CMP Latest Ref Rng & Units 08/03/2021 08/02/2021 08/01/2021  Glucose 70 - 99 mg/dL 93 96 94  BUN 8 - 23 mg/dL 21 18 20   Creatinine 0.44 - 1.00 mg/dL 0.72 0.72 0.76  Sodium 135 - 145 mmol/L 136 136 137  Potassium 3.5 - 5.1 mmol/L 4.2 4.4 4.5  Chloride 98 - 111 mmol/L 105 103 106  CO2 22 - 32 mmol/L 25 24 22   Calcium 8.9 - 10.3 mg/dL 8.8(L) 8.9 8.8(L)  Total Protein 6.5 - 8.1 g/dL 6.6 6.5 6.4(L)  Total Bilirubin 0.3 - 1.2 mg/dL 0.7 0.6 0.7  Alkaline Phos 38 - 126 U/L 212(H) 225(H) 210(H)  AST 15 - 41 U/L 95(H) 109(H) 146(H)  ALT 0 - 44 U/L 302(H) 390(H) 386(H)    Micro 07/26/21 BC - ESBL e.coli 07/26/21 UC e.coli 9/24 /22 BC-NG  Imaging CT abdomen Mild left-sided hydroureteronephrosis without obstructing ureteral calculus. Possible recently passed calculus.   Impression/recommendation ESBL e.coli bacteremia and Urosepsis- Meropenem day 7- Leucocytosis much improved, LFTS improving Can de-escalate meropenem to Bactrim tomorrow to continue until 08/08/21  Slurred speech and facial droop was very likely dystonia from Geodon (  she also received double dose in the ED she says)- Much improved after stopping meds  Mixed pattern cholestatic and hepatocellular- the former is less common in sepsis , but could be compounded  medications.  Did not have typical features of ascending cholangitis  Bipolar disorder- was on lithium, Geodon, and trileptal- the latter 2 on hold Discussed the management with the patient- ID will sign off -c all if needed

## 2021-08-03 NOTE — Hospital Course (Addendum)
Amy Mcconnell is a 70 y.o. F with bipolar disorder, HTN, andhypothyroidism who presented with nausea and vomiting for several days.   She had been sick at home for several days with malaise, nausea, weakness, and vomiting as well as dysuria, polyuria and urgency.  Finally she fell again at home, couldn't get up, so family brought her to the ER, where she was febrile, WBC 19.9K, platelets less than 100K.  Urinalysis suggested infection.  Started on antibiotics and admitted for severe sepsis.   Cultures grew ESBL in 2/2.  ID consulted, transitioned to Meropenem.  Transaminases appeared elevated and GI were consulted. Their work up was reassuring and they felt this was likely due to septic injury.

## 2021-08-03 NOTE — Assessment & Plan Note (Signed)
Continue levothyroxine 

## 2021-08-03 NOTE — Assessment & Plan Note (Signed)
Hgb stable 

## 2021-08-03 NOTE — Assessment & Plan Note (Signed)
-   Hold home BP meds beta-blocker and amlodipine and Lipitor - Continue aspirin, doxazosin

## 2021-08-03 NOTE — Evaluation (Signed)
Occupational Therapy Evaluation Patient Details Name: Amy Mcconnell MRN: [id number] DOB: Oct 02, 1951 Today's Date: 08/03/2021   History of Present Illness Amy Mcconnell is a 70 y.o. female with medical history significant for hyperlipidemia, hypothyroid, history of bipolar, history of schizophrenia, anxiety/depression, hyperlipidemia, hypertension, history of smoking, presents emergency department for chief concerns of nausea and vomiting.   Clinical Impression   Patient reports living at home alone PTA. She does not use AD for independence. She is a Psychologist, occupational at the hospital and she performs all aspects of care, mobility, and IADLs independently. She is motivated to return home to her 3 cats. Patient currently functioning at mod I level with use of RW and close supervision without AD. Pt did transition from PT session right into OT evaluation and does report some fatigue. She may likely benefit from energy conservation education as well.  Patient will benefit from acute OT to increase overall independence in the areas of ADLs, functional mobility, and safety awareness in order to safely discharge home.      Recommendations for follow up therapy are one component of a multi-disciplinary discharge planning process, led by the attending physician.  Recommendations may be updated based on patient status, additional functional criteria and insurance authorization.   Follow Up Recommendations  No OT follow up    Equipment Recommendations  None recommended by OT       Precautions / Restrictions Precautions Precautions: Fall Restrictions Weight Bearing Restrictions: No      Mobility Bed Mobility Overal bed mobility: Modified Independent             General bed mobility comments: Min extra time and effort only    Transfers Overall transfer level: Modified independent Equipment used: None             General transfer comment: Good eccentric and concentric control and  stability    Balance Overall balance assessment: Needs assistance Sitting-balance support: No upper extremity supported;Feet supported Sitting balance-Leahy Scale: Normal     Standing balance support: No upper extremity supported Standing balance-Leahy Scale: Fair Standing balance comment: Pt with mild instability during balance training most notably during semi-tandem stance with head turns                           ADL either performed or assessed with clinical judgement   ADL Overall ADL's : Modified independent                                             Vision Patient Visual Report: No change from baseline              Pertinent Vitals/Pain Pain Assessment: Faces Pain Score: 2  Faces Pain Scale: Hurts a little bit Pain Location: IV sites on BUEs Pain Descriptors / Indicators: Sore Pain Intervention(s): Monitored during session;Repositioned     Hand Dominance Right   Extremity/Trunk Assessment Upper Extremity Assessment Upper Extremity Assessment: Overall WFL for tasks assessed   Lower Extremity Assessment Lower Extremity Assessment: Overall WFL for tasks assessed       Communication Communication Communication: No difficulties   Cognition Arousal/Alertness: Awake/alert Behavior During Therapy: WFL for tasks assessed/performed Overall Cognitive Status: Within Functional Limits for tasks assessed  Exercises Total Joint Exercises Ankle Circles/Pumps: AROM;Strengthening;10 reps Quad Sets: Strengthening;Both;10 reps Heel Slides: Strengthening;Both;5 reps Straight Leg Raises: Strengthening;Both;10 reps Other Exercises Other Exercises: Static standing balance training without UE support with feet apart, together, and semi-tandem with combinations of head still/head turns to pt's tolerance         Home Living Family/patient expects to be discharged to:: Private  residence Living Arrangements: Alone Available Help at Discharge: Family;Friend(s);Available PRN/intermittently Type of Home: House Home Access: Stairs to enter [employer] of Steps: 7 Entrance Stairs-Rails: None Home Layout: Two level;Laundry or work area in Building surveyor of Steps: 13 Alternate Level Stairs-Rails: Right;Left Bathroom Shower/Tub: Teacher, early years/pre: Handicapped height Bathroom Accessibility: Yes   Home Equipment: Environmental consultant - 2 wheels;Crutches;Wheelchair - manual;Tub bench   Additional Comments: Pt reports she does not use AD and has loaned out several items to family and friends      Prior Functioning/Environment Level of Independence: Independent                 OT Problem List: Decreased activity tolerance;Impaired balance (sitting and/or standing);Decreased knowledge of precautions;Decreased safety awareness;Decreased strength;Decreased knowledge of use of DME or AE      OT Treatment/Interventions: Self-care/ADL training;Therapeutic exercise;Neuromuscular education;Energy conservation;DME and/or AE instruction;Manual therapy;Balance training;Patient/family education;Therapeutic activities    OT Goals(Current goals can be found in the care plan section) Acute Rehab OT Goals Patient Stated Goal: to go home. ADL Goals Pt Will Perform Grooming: standing;Independently Pt Will Perform Lower Body Dressing: sit to/from stand;Independently Pt Will Transfer to Toilet: ambulating;Independently Pt Will Perform Toileting - Clothing Manipulation and hygiene: Independently;sit to/from stand Pt Will Perform Tub/Shower Transfer: with modified independence;ambulating  OT Frequency: Min 2X/week   Barriers to D/C:    pt lives alone          AM-PAC OT "6 Clicks" Daily Activity     Outcome Measure Help from another person eating meals?: None Help from another person taking care of personal grooming?: None Help from  another person toileting, which includes using toliet, bedpan, or urinal?: None Help from another person bathing (including washing, rinsing, drying)?: None Help from another person to put on and taking off regular upper body clothing?: None Help from another person to put on and taking off regular lower body clothing?: None 6 Click Score: 24   End of Session    Activity Tolerance: Patient tolerated treatment well Patient left: in bed;with call bell/phone within reach;with bed alarm set  OT Visit Diagnosis: Unsteadiness on feet (R26.81);Muscle weakness (generalized) (M62.81)                Time: 1345-1406 OT Time Calculation (min): 21 min Charges:  OT General Charges $OT Visit: 1 Visit OT Evaluation $OT Eval Low Complexity: 1 Low OT Treatments $Self Care/Home Management : 8-22 mins Darleen Crocker, MS, OTR/L , CBIS ascom [phone]  08/03/21, 2:22 PM

## 2021-08-03 NOTE — Assessment & Plan Note (Signed)
-   Hold Geodon, Trileptal until LFTs improve - Continue lithium

## 2021-08-03 NOTE — Assessment & Plan Note (Addendum)
LFTs trending down.  Likely related to sepsis.  GI consulted. RUQ [place] normal. Hepatitis serologies normal.  With normal Tbili and exam, doubt cholangitis.  No acetaminophen. - Trend LFTs - Hold Trileptal, statin, Tylenol

## 2021-08-04 ENCOUNTER — Telehealth: Payer: Self-pay | Admitting: Family Medicine

## 2021-08-04 LAB — COMPREHENSIVE METABOLIC PANEL
ALT: 235 U/L — ABNORMAL HIGH (ref 0–44)
AST: 59 U/L — ABNORMAL HIGH (ref 15–41)
Albumin: 2.7 g/dL — ABNORMAL LOW (ref 3.5–5.0)
Alkaline Phosphatase: 186 U/L — ABNORMAL HIGH (ref 38–126)
Anion gap: 6 (ref 5–15)
BUN: 21 mg/dL (ref 8–23)
CO2: 25 mmol/L (ref 22–32)
Calcium: 8.6 mg/dL — ABNORMAL LOW (ref 8.9–10.3)
Chloride: 104 mmol/L (ref 98–111)
Creatinine, Ser: 0.69 mg/dL (ref 0.44–1.00)
GFR, Estimated: >60 mL/min (ref >60)
Glucose, Bld: 94 mg/dL (ref 70–99)
Potassium: 4 mmol/L (ref 3.5–5.1)
Sodium: 135 mmol/L (ref 135–145)
Total Bilirubin: 0.7 mg/dL (ref 0.3–1.2)
Total Protein: 6.3 g/dL — ABNORMAL LOW (ref 6.5–8.1)

## 2021-08-04 LAB — CBC
HCT: 32.4 % — ABNORMAL LOW (ref 36.0–46.0)
Hemoglobin: 10 g/dL — ABNORMAL LOW (ref 12.0–15.0)
MCH: 27.2 pg (ref 26.0–34.0)
MCHC: 30.9 g/dL (ref 30.0–36.0)
MCV: 88 fL (ref 80.0–100.0)
Platelets: 260 10*3/uL (ref 150–400)
RBC: 3.68 MIL/uL — ABNORMAL LOW (ref 3.87–5.11)
RDW: 15.6 % — ABNORMAL HIGH (ref 11.5–15.5)
WBC: 11.6 10*3/uL — ABNORMAL HIGH (ref 4.0–10.5)
nRBC: 0 % (ref 0.0–0.2)

## 2021-08-04 MED ORDER — SULFAMETHOXAZOLE-TRIMETHOPRIM 800-160 MG PO TABS: 1.0000 | ORAL_TABLET | Freq: Two times a day (BID) | ORAL | 0 refills | Status: DC

## 2021-08-04 NOTE — Care Management Important Message (Signed)
Important Message  Patient Details  Name: Amy Mcconnell MRN: [id number] Date of Birth: [birth year]   Medicare Important Message Given:  Yes  Reviewed with patient via room phone due to isolation status.  Copy of Medicare IM mailed to home address on file.    Dannette Barbara 08/04/2021, 10:02 AM

## 2021-08-04 NOTE — Telephone Encounter (Signed)
Patient called in stating that she was discharged from the hospital today and informed that she needs to have a hospital follow up with Dr.Sonnenberg next week.The next opening will not be until 11/1.Please advise.  Dehaven Sine,cma

## 2021-08-04 NOTE — Assessment & Plan Note (Signed)
Lithium continued on admission.  Had excess Geodon dose at admission, developed some dystonia/face droop, Geodon held temporarily, then restarted.  Trileptal held.

## 2021-08-04 NOTE — Assessment & Plan Note (Signed)
Due to ESBL E. coli bacteremia, urinary source Patient presented with thrombocytopenia, elevated LFTs, and bacteremia. Infectious disease was consulted.  Blood cultures growing ESBL in 2 of 2. ID consulted.  Treated with meropenem x8 days.  WBC normalized.  Discharged to complete 10 day course with Bactrim.

## 2021-08-04 NOTE — Progress Notes (Signed)
Patient to be discharged from hospital today.  No acute distress noted.  Patient given all pertinent information, prescriptions, and personal belongings.  Patient able to teach back discharge instructions.  IV site d/ced.  Care relinquished.

## 2021-08-04 NOTE — TOC Transition Note (Signed)
Transition of Care [facility]) - CM/SW Discharge Note   Patient Details  Name: Amy Mcconnell MRN: [id number] Date of Birth: 1951/08/29  [facility]) CM/SW Contact:  Candie Chroman, LCSW Phone Number: 08/04/2021, 10:39 AM   Clinical Narrative:   Patient has orders to discharge home today. [place] representative is aware. No further concerns. CSW signing off.  Final next level of care: [place] Barriers to Discharge: Barriers Resolved   Patient Goals and CMS Choice Patient states their goals for this hospitalization and ongoing recovery are:: to return home CMS Medicare.gov Compare Post Acute Care list provided to:: Patient Choice offered to / list presented to : Patient  Discharge Placement                    Patient and family notified of of transfer: 08/04/21  Discharge Plan and Services                          HH Arranged: PT [facility] Agency: [place] Date [place]: 08/04/21   Representative spoke with at [place]: [place]  Social Determinants of Health ([place]) Interventions     Readmission Risk Interventions Readmission Risk Prevention Plan 07/28/2021  Transportation Screening Complete  PCP or Specialist Appt within 5-7 Days Complete  Home Care Screening Complete  Medication Review (RN CM) Complete  Some recent data might be hidden

## 2021-08-04 NOTE — Discharge Summary (Signed)
Physician Discharge Summary   Patient name: Amy Mcconnell  Admit date:     07/26/2021  Discharge date: 08/04/2021  Attending Physician: Amy Mcconnell [[id number]]  Discharge Physician: Amy Mcconnell   PCP: Amy Haven, MD      Recommendations at discharge:  Follow up with PCP Amy Mcconnell Amy Mcconnell: Please trend LFTs to normal Amy Mcconnell: If LFT normalize or near normalize, Trileptal can be restarted Amy Mcconnell; Please resume BP medications if needed         Discharge Diagnoses Principal Problem:   Severe sepsis [facility]) Active Problems:   Hyponatremia   Essential hypertension   Bipolar disorder ([place])   Hypothyroidism   Normocytic anemia   Transaminitis      Hospital Course   Amy Mcconnell is a 70 y.o. F with bipolar disorder, HTN, andhypothyroidism who presented with nausea and vomiting for several days.   She had been sick at home for several days with malaise, nausea, weakness, and vomiting as well as dysuria, polyuria and urgency.  Finally she fell again at home, couldn't get up, so family brought her to the ER, where she was febrile, WBC 19.9K, platelets less than 100K.  Urinalysis suggested infection.  Started on antibiotics and admitted for severe sepsis.   Cultures grew ESBL in 2/2.  ID consulted, transitioned to Meropenem.  Transaminases appeared elevated and GI were consulted. Their work up was reassuring and they felt this was likely due to septic injury.   Severe sepsis ([place]) Due to ESBL E. coli bacteremia, urinary source Patient presented with thrombocytopenia, elevated LFTs, and bacteremia.  Infectious disease was consulted.  Blood cultures growing ESBL in 2 of 2. ID consulted.  Treated with meropenem x8 days.  WBC normalized.  Discharged to complete 10 day course with Bactrim.  Bipolar disorder ([facility]) Lithium continued on admission.  Had excess Geodon dose at admission, developed some dystonia/face droop,  Geodon held temporarily, then restarted.  Trileptal held.     Essential hypertension BP soft, amlodipine and BB held at d/c. Follow up with PCP Amy Mcconnell.  Transaminitis Noted to have AST/ALT 128/172 on admission.  This trended up to 286/566 U/L.    RUQ [place] normal.  Hepatitis serologies normal.  In the setting of completely normal right upper quadrant exam, normal total bilirubin, cholangitis ruled out.  No acetaminophen use.    Statin and Trileptal were held, GI were consulted who felt that her transaminitis is most likely sepsis related injury.   Her LFTs trended down to 59/235 on discharge.  I discussed this personally with her psychiatrist Amy Mcconnell.  Amy Mcconnell acknowledge that Trileptal and Tegretol both can cause some increase in transaminases.  He supported holding this medication for the time being, and trending her LFTs.  If her LFTs returned to normal or near normal, it would be reasonable to resume Trileptal.         Condition at discharge: good  Exam Physical Exam Vitals reviewed.  Constitutional:      General: She is not in acute distress.    Appearance: Normal appearance. She is not toxic-appearing.  HENT:     Head: Normocephalic and atraumatic.  Eyes:     General: No scleral icterus.    Conjunctiva/sclera: Conjunctivae normal.  Cardiovascular:     Rate and Rhythm: Normal rate and regular rhythm.     Heart sounds: Normal heart sounds. No murmur heard. Pulmonary:     Effort: Pulmonary effort is normal.  Breath sounds: No wheezing or rales.  Abdominal:     General: Abdomen is flat.     Palpations: Abdomen is soft.     Tenderness: There is no abdominal tenderness. There is no guarding or rebound.  Skin:    General: Skin is warm and dry.  Neurological:     General: No focal deficit present.     Mental Status: She is alert and oriented to person, place, and time.  Psychiatric:        Mood and Affect: Mood normal.        Behavior: Behavior normal.         Thought Content: Thought content normal.      Disposition: Home with HH due to imbalanec with walking  Discharge time: greater than 30 minutes. Allergies as of 08/04/2021       Reactions   Ace Inhibitors    Angioedema   Angiotensin Receptor Blockers    Angioedema   Benicar [olmesartan]    Mouth & Lip Swelling, "I swell from the waist down". Throat swelling   Poison Ivy Extract [poison Ivy Extract]    Blisters   Purell Instant Hand [alcohol] Swelling   Triclosan         Medication List     STOP taking these medications    acetaminophen 650 MG CR tablet Commonly known as: TYLENOL   amLODipine 5 MG tablet Commonly known as: NORVASC   atorvastatin 40 MG tablet Commonly known as: LIPITOR   Oxcarbazepine 300 MG tablet Commonly known as: TRILEPTAL   propranolol ER 60 MG 24 hr capsule Commonly known as: INDERAL LA       TAKE these medications    aspirin 81 MG tablet Take 81 mg by mouth daily.   Biotin 5 MG Caps Take 5 mg by mouth daily.   calcium carbonate 600 MG Tabs tablet Commonly known as: OS-CAL Take 600 mg by mouth daily.   diphenhydrAMINE 25 mg capsule Commonly known as: BENADRYL Take 25 mg by mouth daily as needed for allergies.   docusate sodium 100 MG capsule Commonly known as: COLACE Take 100 mg by mouth daily.   doxazosin 8 MG tablet Commonly known as: CARDURA Take 1 tablet by mouth twice daily   EPINEPHrine 0.3 mg/0.3 mL Soaj injection Commonly known as: EPI-PEN Inject 0.3 mLs (0.3 mg total) into the muscle once.   fexofenadine 180 MG tablet Commonly known as: ALLEGRA Take 180 mg by mouth daily as needed for allergies.   Fish Oil 1000 MG Caps Take 3,000 mg by mouth daily.   FLAXSEED OIL PO Take 1 capsule by mouth daily.   levothyroxine 125 MCG tablet Commonly known as: SYNTHROID Take 125 mcg by mouth daily before breakfast.   lithium carbonate 300 MG CR tablet Commonly known as: LITHOBID Take 600 mg by mouth at  bedtime.   magnesium oxide 400 MG tablet Commonly known as: MAG-OX Take 400 mg by mouth daily.   sulfamethoxazole-trimethoprim 800-160 MG tablet Commonly known as: BACTRIM DS Take 1 tablet by mouth every 12 (twelve) hours.   Vision Formula/Lutein Tabs Take 1 tablet by mouth in the morning and at bedtime.   multivitamin with minerals tablet Take 1 tablet by mouth daily.   VITAMIN B COMPLEX PO Take 1 tablet by mouth daily.   vitamin B-12 1000 MCG tablet Commonly known as: CYANOCOBALAMIN Take 1,000 mcg by mouth daily.   vitamin C 1000 MG tablet Take 1,000 mg by mouth daily.   Vitamin D3  50 MCG (2000 UT) capsule Take 2,000 Units by mouth daily.   vitamin E 1000 UNIT capsule Take 1,000 Units by mouth daily.   zinc gluconate 50 MG tablet Take 50 mg by mouth daily.   ziprasidone 20 MG capsule Commonly known as: GEODON Take 40 mg by mouth at bedtime.        CT HEAD WO CONTRAST  Result Date: 07/26/2021 CLINICAL DATA:  Neuro deficit, acute stroke suspected. Nausea, vomiting and facial droop for a while. EXAM: CT HEAD WITHOUT CONTRAST TECHNIQUE: Contiguous axial images were obtained from the base of the skull through the vertex without intravenous contrast. COMPARISON:  CT head 04/14/2019. FINDINGS: Brain: There is no evidence of acute intracranial hemorrhage, mass lesion, brain edema or extra-axial fluid collection. The ventricles and subarachnoid spaces are appropriately sized for age. There is no CT evidence of acute cortical infarction. Vascular:  No hyperdense vessel identified. Skull: Negative for fracture or focal lesion. Sinuses/Orbits: The visualized paranasal sinuses and mastoid air cells are clear. No orbital abnormalities are seen. Other: The previously demonstrated large left scalp hematoma has resolved. IMPRESSION: No acute intracranial or calvarial findings. No CT evidence of acute stroke. Resolution of previously demonstrated large left scalp hematoma. Electronically  Signed   By: Richardean Sale M.D.   On: 07/26/2021 14:49   MR BRAIN WO CONTRAST  Result Date: 07/26/2021 CLINICAL DATA:  Neuro deficit, acute, stroke suspected. Generalized weakness with recent fall. EXAM: MRI HEAD WITHOUT CONTRAST TECHNIQUE: Multiplanar, multiecho pulse sequences of the brain and surrounding structures were obtained without intravenous contrast. COMPARISON:  Head CT same day FINDINGS: Brain: Diffusion imaging does not show any acute or subacute infarction. The brainstem and cerebellum are normal. Cerebral hemispheres show minimal small vessel change of the white matter, less than often seen at this age. No cortical or large vessel territory infarction. No mass lesion, hemorrhage, hydrocephalus or extra-axial collection. Vascular: Major vessels at the base of the brain show flow. Skull and upper cervical spine: Negative Sinuses/Orbits: Clear/normal Other: None IMPRESSION: No acute finding. Mild chronic small-vessel ischemic change of the cerebral hemispheric white matter, less than often seen at this age. Electronically Signed   By: Nelson Chimes M.D.   On: 07/26/2021 20:22   US Abdomen Complete  Result Date: 07/27/2021 CLINICAL DATA:  Transaminitis EXAM: ABDOMEN ULTRASOUND COMPLETE COMPARISON:  CT AP 07/26/2021 FINDINGS: Gallbladder: Mild gallbladder wall thickening measures 3.5 mm. No pericholecystic fluid. No gallstones. Negative sonographic Murphy's sign. Common bile duct: Diameter: 4.2 mm Liver: No focal liver abnormality. Liver is enlarged. Normal parenchymal echogenicity. Portal vein is patent on color Doppler imaging with normal direction of blood flow towards the liver. IVC: No abnormality visualized. Pancreas: Visualized portion unremarkable. Spleen: Size and appearance within normal limits. Right Kidney: Length: 14.1. Normal parenchymal echogenicity. Numerous cysts measuring up to 1.2 cm. No hydronephrosis. Left Kidney: Length: 14.0 cm. Normal parenchymal echogenicity. Multiple  cysts measure up to 0.7 x 0.6 x 0.8 cm. No hydronephrosis. Abdominal aorta: No aneurysm visualized. Aortic atherosclerotic calcifications Other findings: None. IMPRESSION: 1. Mild gallbladder wall thickening measuring 3.5 mm. No secondary signs of acute cholecystitis. Specifically, there is no pericholecystic fluid, gallstones or positive sonographic Murphy's sign. 2. Hepatomegaly.  No focal liver abnormality. 3. Numerous bilateral kidney cysts. 4.  Aortic Atherosclerosis (ICD10-I70.0). Electronically Signed   By: Kerby Moors M.D.   On: 07/27/2021 09:28   CT ABDOMEN PELVIS W CONTRAST  Result Date: 07/26/2021 CLINICAL DATA:  Abdominal pain and fever. EXAM: CT ABDOMEN  AND PELVIS WITH CONTRAST TECHNIQUE: Multidetector CT imaging of the abdomen and pelvis was performed using the standard protocol following bolus administration of intravenous contrast. CONTRAST:  67mL OMNIPAQUE IOHEXOL 350 MG/ML SOLN COMPARISON:  None. FINDINGS: Lower chest: Streaky basilar scarring changes or atelectasis. No pleural effusions or pulmonary lesions. The heart is normal in size. Aortic and coronary artery calcifications are noted. Hepatobiliary: No hepatic lesions or intrahepatic biliary dilatation. The gallbladder is unremarkable. No common bile duct dilatation. Pancreas: No mass, inflammation or ductal dilatation. Spleen: Normal size.  No focal lesions. Adrenals/Urinary Tract: Small bilateral adrenal gland nodules likely small adenomas. Innumerable small renal cysts are noted bilaterally. No worrisome renal lesions. There is mild left-sided hydronephrosis and hydroureter but no obstructing ureteral calculi are identified. Possible recently passed calculus. Bladder is unremarkable. Stomach/Bowel: The stomach, duodenum, small bowel and colon are grossly normal. Vascular/Lymphatic: Moderate atherosclerotic calcifications involving the aorta and iliac arteries and branch vessel ostia. No aneurysm or dissection. The major venous  structures are patent. Small scattered mesenteric and retroperitoneal lymph nodes but no mass or overt adenopathy. Reproductive: The uterus and ovaries are unremarkable. Other: No pelvic mass or adenopathy. No free pelvic fluid collections. No inguinal mass or adenopathy. No abdominal wall hernia or subcutaneous lesions. Musculoskeletal: No significant bony findings. IMPRESSION: 1. No acute abdominal/pelvic findings, mass lesions or adenopathy. 2. Innumerable small renal cysts. 3. Small bilateral adrenal gland nodules likely small adenomas. 4. Mild left-sided hydroureteronephrosis without obstructing ureteral calculus. Possible recently passed calculus. 5. Moderate atherosclerotic calcifications involving the aorta and iliac arteries. Aortic Atherosclerosis (ICD10-I70.0). Electronically Signed   By: Marijo Sanes M.D.   On: 07/26/2021 17:37   DG Chest Portable 1 View  Result Date: 07/26/2021 CLINICAL DATA:  Chest pain and facial droop EXAM: PORTABLE CHEST 1 VIEW COMPARISON:  04/09/2019 FINDINGS: Cardiac shadow is within normal limits. Lungs are well aerated bilaterally. No focal infiltrate or sizable effusion is seen. No bony abnormality is noted. IMPRESSION: No active disease. Electronically Signed   By: Inez Catalina M.D.   On: 07/26/2021 17:58   Results for orders placed or performed during the hospital encounter of 07/26/21  Resp Panel by RT-PCR (Flu A&B, Covid) Nasopharyngeal Swab     Status: None   Collection Time: 07/26/21  4:15 PM   Specimen: Nasopharyngeal Swab; Nasopharyngeal(NP) swabs in vial transport medium  Result Value Ref Range Status   SARS Coronavirus 2 by RT PCR NEGATIVE NEGATIVE Final    Comment: (NOTE) SARS-CoV-2 target nucleic acids are NOT DETECTED.  The SARS-CoV-2 RNA is generally detectable in upper respiratory specimens during the acute phase of infection. The lowest concentration of SARS-CoV-2 viral copies this assay can detect is 138 copies/mL. A negative result does not  preclude SARS-Cov-2 infection and should not be used as the sole basis for treatment or other patient management decisions. A negative result may occur with  improper specimen collection/handling, submission of specimen other than nasopharyngeal swab, presence of viral mutation(s) within the areas targeted by this assay, and inadequate number of viral copies(<138 copies/mL). A negative result must be combined with clinical observations, patient history, and epidemiological information. The expected result is Negative.  Fact Sheet for Patients:  EntrepreneurPulse.com.au  Fact Sheet for Healthcare Providers:  IncredibleEmployment.be  This test is no t yet approved or cleared by the [place] FDA and  has been authorized for detection and/or diagnosis of SARS-CoV-2 by FDA under an Emergency Use Authorization (EUA). This EUA will remain  in effect (meaning this  test can be used) for the duration of the COVID-19 declaration under Section 564(b)(1) of the Act, 21 U.S.C.section 360bbb-3(b)(1), unless the authorization is terminated  or revoked sooner.       Influenza A by PCR NEGATIVE NEGATIVE Final   Influenza B by PCR NEGATIVE NEGATIVE Final    Comment: (NOTE) The Xpert Xpress SARS-CoV-2/FLU/RSV plus assay is intended as an aid in the diagnosis of influenza from Nasopharyngeal swab specimens and should not be used as a sole basis for treatment. Nasal washings and aspirates are unacceptable for Xpert Xpress SARS-CoV-2/FLU/RSV testing.  Fact Sheet for Patients: EntrepreneurPulse.com.au  Fact Sheet for Healthcare Providers: IncredibleEmployment.be  This test is not yet approved or cleared by the [place] FDA and has been authorized for detection and/or diagnosis of SARS-CoV-2 by FDA under an Emergency Use Authorization (EUA). This EUA will remain in effect (meaning this test can be used) for the  duration of the COVID-19 declaration under Section 564(b)(1) of the Act, 21 U.S.C. section 360bbb-3(b)(1), unless the authorization is terminated or revoked.  Performed at [facility], [address], [place], [place] [address]   Urine Culture     Status: Abnormal   Collection Time: 07/26/21  5:00 PM   Specimen: Urine, Random  Result Value Ref Range Status   Specimen Description   Final    URINE, RANDOM Performed at [facility], [address], [place], [place] [address]    Special Requests   Final    NONE Performed at [facility], [address] [place], [place] [address]    Culture (A)  Final    >=100,000 COLONIES/mL ESCHERICHIA COLI Confirmed Extended Spectrum Beta-Lactamase Producer (ESBL).  In bloodstream infections from ESBL organisms, carbapenems are preferred over piperacillin/tazobactam. They are shown to have a lower risk of mortality.    Report Status 07/29/2021 FINAL  Final   Organism ID, Bacteria ESCHERICHIA COLI (A)  Final      Susceptibility   Escherichia coli - MIC*    AMPICILLIN >=32 RESISTANT Resistant     CEFAZOLIN >=64 RESISTANT Resistant     CEFEPIME 4 INTERMEDIATE Intermediate     CEFTRIAXONE >=64 RESISTANT Resistant     CIPROFLOXACIN >=4 RESISTANT Resistant     GENTAMICIN >=16 RESISTANT Resistant     IMIPENEM <=0.25 SENSITIVE Sensitive     NITROFURANTOIN <=16 SENSITIVE Sensitive     TRIMETH/SULFA <=20 SENSITIVE Sensitive     AMPICILLIN/SULBACTAM 4 SENSITIVE Sensitive     PIP/TAZO <=4 SENSITIVE Sensitive     * >=100,000 COLONIES/mL ESCHERICHIA COLI  Blood culture (routine x 2)     Status: Abnormal   Collection Time: 07/26/21  5:50 PM   Specimen: BLOOD  Result Value Ref Range Status   Specimen Description   Final    BLOOD RIGHT ANTECUBITAL Performed at [facility], [address] [place], [place] [address]    Special Requests   Final    BOTTLES DRAWN AEROBIC AND ANAEROBIC Blood Culture results may not  be optimal due to an inadequate volume of blood received in culture bottles Performed at [facility], [address] [place],  [address]    Culture  Setup Time   Final    GRAM NEGATIVE RODS IN BOTH AEROBIC AND ANAEROBIC BOTTLES CRITICAL VALUE NOTED.  VALUE IS CONSISTENT WITH PREVIOUSLY REPORTED AND CALLED VALUE. Performed at [facility], [address], [place],  [address]    Culture (A)  Final    ESCHERICHIA COLI  SUSCEPTIBILITIES PERFORMED ON PREVIOUS CULTURE WITHIN THE LAST 5 DAYS. Performed at [facility], [place] [address], [place], [place] [address]    Report Status 07/29/2021 FINAL  Final  Blood culture (routine x 2)     Status: Abnormal   Collection Time: 07/26/21  5:50 PM   Specimen: BLOOD  Result Value Ref Range Status   Specimen Description   Final    BLOOD BLOOD RIGHT FOREARM Performed at [facility], [address] [place], [place] [address]    Special Requests   Final    BOTTLES DRAWN AEROBIC AND ANAEROBIC Blood Culture results may not be optimal due to an inadequate volume of blood received in culture bottles Performed at [facility], [address], [place], [place] [address]    Culture  Setup Time   Final    GRAM NEGATIVE RODS IN BOTH AEROBIC AND ANAEROBIC BOTTLES CRITICAL RESULT CALLED TO, READ BACK BY AND VERIFIED WITH: NATHAN BLUE@0300  07/27/21 RH Performed at [facility], [place] [address], [place], [place] [address]    Culture (A)  Final    ESCHERICHIA COLI Confirmed Extended Spectrum Beta-Lactamase Producer (ESBL).  In bloodstream infections from ESBL organisms, carbapenems are preferred over piperacillin/tazobactam. They are shown to have a lower risk of mortality.    Report Status 07/29/2021 FINAL  Final   Organism ID, Bacteria ESCHERICHIA COLI  Final      Susceptibility   Escherichia coli - MIC*    AMPICILLIN >=32 RESISTANT Resistant     CEFAZOLIN >=64 RESISTANT Resistant      CEFEPIME 2 SENSITIVE Sensitive     CEFTAZIDIME RESISTANT Resistant     CEFTRIAXONE >=64 RESISTANT Resistant     CIPROFLOXACIN >=4 RESISTANT Resistant     GENTAMICIN >=16 RESISTANT Resistant     IMIPENEM <=0.25 SENSITIVE Sensitive     TRIMETH/SULFA <=20 SENSITIVE Sensitive     AMPICILLIN/SULBACTAM 4 SENSITIVE Sensitive     PIP/TAZO <=4 SENSITIVE Sensitive     * ESCHERICHIA COLI  Blood Culture ID Panel (Reflexed)     Status: Abnormal   Collection Time: 07/26/21  5:50 PM  Result Value Ref Range Status   Enterococcus faecalis NOT DETECTED NOT DETECTED Final   Enterococcus Faecium NOT DETECTED NOT DETECTED Final   Listeria monocytogenes NOT DETECTED NOT DETECTED Final   Staphylococcus species NOT DETECTED NOT DETECTED Final   Staphylococcus aureus (BCID) NOT DETECTED NOT DETECTED Final   Staphylococcus epidermidis NOT DETECTED NOT DETECTED Final   Staphylococcus lugdunensis NOT DETECTED NOT DETECTED Final   Streptococcus species NOT DETECTED NOT DETECTED Final   Streptococcus agalactiae NOT DETECTED NOT DETECTED Final   Streptococcus pneumoniae NOT DETECTED NOT DETECTED Final   Streptococcus pyogenes NOT DETECTED NOT DETECTED Final   A.calcoaceticus-baumannii NOT DETECTED NOT DETECTED Final   Bacteroides fragilis NOT DETECTED NOT DETECTED Final   Enterobacterales DETECTED (A) NOT DETECTED Final    Comment: Enterobacterales represent a large order of gram negative bacteria, not a single organism. CRITICAL RESULT CALLED TO, READ BACK BY AND VERIFIED WITH: NATHAN BLUE@0300  07/27/21 RH C/KATIE ALLRED@0302  07/27/21 RH    Enterobacter cloacae complex NOT DETECTED NOT DETECTED Final   Escherichia coli DETECTED (A) NOT DETECTED Final    Comment: CRITICAL RESULT CALLED TO, READ BACK BY AND VERIFIED WITH: NATHAN BLUE@0300  07/27/21 RH C/KATIE ALLRED@0302  07/27/21 RH    Klebsiella aerogenes NOT DETECTED NOT DETECTED Final   Klebsiella oxytoca NOT DETECTED NOT DETECTED Final   Klebsiella pneumoniae  NOT  DETECTED NOT DETECTED Final   Proteus species NOT DETECTED NOT DETECTED Final   Salmonella species NOT DETECTED NOT DETECTED Final   Serratia marcescens NOT DETECTED NOT DETECTED Final   Haemophilus influenzae NOT DETECTED NOT DETECTED Final   Neisseria meningitidis NOT DETECTED NOT DETECTED Final   Pseudomonas aeruginosa NOT DETECTED NOT DETECTED Final   Stenotrophomonas maltophilia NOT DETECTED NOT DETECTED Final   Candida albicans NOT DETECTED NOT DETECTED Final   Candida auris NOT DETECTED NOT DETECTED Final   Candida glabrata NOT DETECTED NOT DETECTED Final   Candida krusei NOT DETECTED NOT DETECTED Final   Candida parapsilosis NOT DETECTED NOT DETECTED Final   Candida tropicalis NOT DETECTED NOT DETECTED Final   Cryptococcus neoformans/gattii NOT DETECTED NOT DETECTED Final   CTX-M ESBL DETECTED (A) NOT DETECTED Final    Comment: CRITICAL RESULT CALLED TO, READ BACK BY AND VERIFIED WITH: NATHAN BLUE@0300  07/27/21 RH C/KATIE ALLRED@0302  07/27/21 RH (NOTE) Extended spectrum beta-lactamase detected. Recommend a carbapenem as initial therapy.      Carbapenem resistance IMP NOT DETECTED NOT DETECTED Final   Carbapenem resistance KPC NOT DETECTED NOT DETECTED Final   Carbapenem resistance NDM NOT DETECTED NOT DETECTED Final   Carbapenem resist OXA 48 LIKE NOT DETECTED NOT DETECTED Final   Carbapenem resistance VIM NOT DETECTED NOT DETECTED Final    Comment: Performed at [facility], [address] [place], [place] [address]  CULTURE, BLOOD (ROUTINE X 2) w Reflex to ID Panel     Status: None   Collection Time: 07/29/21 12:18 AM   Specimen: BLOOD  Result Value Ref Range Status   Specimen Description BLOOD LEFT ANTECUBITAL  Final   Special Requests   Final    BOTTLES DRAWN AEROBIC AND ANAEROBIC Blood Culture adequate volume   Culture   Final    NO GROWTH 5 DAYS Performed at [facility], [address] [place], [place] [address]    Report Status  08/03/2021 FINAL  Final  CULTURE, BLOOD (ROUTINE X 2) w Reflex to ID Panel     Status: None   Collection Time: 07/29/21 12:19 AM   Specimen: BLOOD  Result Value Ref Range Status   Specimen Description BLOOD BLOOD LEFT HAND  Final   Special Requests   Final    BOTTLES DRAWN AEROBIC AND ANAEROBIC Blood Culture adequate volume   Culture   Final    NO GROWTH 5 DAYS Performed at [facility], [address], [place], [place] [address]    Report Status 08/03/2021 FINAL  Final    Signed:  Edwin Dada MD.  [facility] 08/04/2021, 6:15 PM

## 2021-08-04 NOTE — Assessment & Plan Note (Signed)
BP soft, amlodipine and BB held at d/c. Follow up with PCP Dr. Caryl Bis.

## 2021-08-04 NOTE — Assessment & Plan Note (Signed)
Noted to have AST/ALT 128/172 on admission.  This trended up to 286/566 U/L.    RUQ [place] normal.  Hepatitis serologies normal.  In the setting of completely normal right upper quadrant exam, normal total bilirubin, cholangitis ruled out.  No acetaminophen use.    Statin and Trileptal were held, GI were consulted who felt that her transaminitis is most likely sepsis related injury.   Her LFTs trended down to 59/235 on discharge.  I discussed this personally with her psychiatrist Dr. Thurmond Butts.  Dr. Thurmond Butts acknowledge that Trileptal and Tegretol both can cause some increase in transaminases.  He supported holding this medication for the time being, and trending her LFTs.  If her LFTs returned to normal or near normal, it would be reasonable to resume Trileptal.

## 2021-08-04 NOTE — Telephone Encounter (Signed)
LM that I had added patient to the 3:30 on 10/5 to see Dr. Caryl Bis. I asked that she please call us Monday or as soon as she could to see what appointment she preferred. I have not canceled the one with Joycelyn Schmid as of yet.

## 2021-08-04 NOTE — Telephone Encounter (Signed)
Patient called in stating that she was discharged from the hospital today and informed that she needs to have a hospital follow up with Dr.Sonnenberg next week.The next opening will not be until 11/1.Please advise.

## 2021-08-04 NOTE — Telephone Encounter (Signed)
There is an appointment open at 3:30 on 08/09/2021.

## 2021-08-07 ENCOUNTER — Telehealth: Payer: Self-pay

## 2021-08-07 NOTE — Telephone Encounter (Signed)
Transition Care Management Unsuccessful Follow-up Telephone Call  Date of discharge and from where:  08/04/21 [facility]  Attempts:  1st Attempt  Reason for unsuccessful TCM follow-up call:  Unable to reach patient

## 2021-08-07 NOTE — Telephone Encounter (Signed)
Patient calling back in. She will come see Dr Caryl Bis 08/09/21. Cancelled appointment with Joycelyn Schmid.

## 2021-08-08 NOTE — Telephone Encounter (Signed)
Transition Care Management Unsuccessful Follow-up Telephone Call  Date of discharge and from where:  08/04/21 [facility]  Attempts:  2nd Attempt  Reason for unsuccessful TCM follow-up call:  Left voice message. Appointment scheduled with pcp 08/09/21 at 3:30. Keep all scheduled appointments. TCM closed at this time.

## 2021-08-09 ENCOUNTER — Other Ambulatory Visit: Payer: Self-pay

## 2021-08-09 ENCOUNTER — Ambulatory Visit (INDEPENDENT_AMBULATORY_CARE_PROVIDER_SITE_OTHER): Payer: Medicare HMO | Admitting: Family Medicine

## 2021-08-09 VITALS — BP 140/80 | HR 92 | Temp 98.5°F | Ht 67.0 in | Wt 137.0 lb

## 2021-08-09 DIAGNOSIS — L89301 Pressure ulcer of unspecified buttock, stage 1: Secondary | ICD-10-CM | POA: Insufficient documentation

## 2021-08-09 DIAGNOSIS — A419 Sepsis, unspecified organism: Secondary | ICD-10-CM | POA: Diagnosis not present

## 2021-08-09 DIAGNOSIS — G44229 Chronic tension-type headache, not intractable: Secondary | ICD-10-CM

## 2021-08-09 DIAGNOSIS — G8929 Other chronic pain: Secondary | ICD-10-CM | POA: Insufficient documentation

## 2021-08-09 DIAGNOSIS — Z23 Encounter for immunization: Secondary | ICD-10-CM | POA: Diagnosis not present

## 2021-08-09 DIAGNOSIS — R197 Diarrhea, unspecified: Secondary | ICD-10-CM | POA: Insufficient documentation

## 2021-08-09 DIAGNOSIS — R6520 Severe sepsis without septic shock: Secondary | ICD-10-CM

## 2021-08-09 DIAGNOSIS — R7989 Other specified abnormal findings of blood chemistry: Secondary | ICD-10-CM

## 2021-08-09 NOTE — Progress Notes (Signed)
Amy Rumps, MD Phone: [phone]  Amy Mcconnell is a 70 y.o. female who presents today for hospital follow-up.  Sepsis/UTI/bacteremia: Patient was found to have ESBL positive cultures with her urine is the likely source.  She had nausea, vomiting, malaise, weakness, dysuria, frequency, and urgency prior to going to the hospital.  She reports complete resolution of her symptoms.  She has finished her Bactrim.  She did have a liver injury related to her sepsis.  Her Trileptal and her statin has been held.  She notes some stomach upset with some loose stools at times depending on what she eats.  She reports a raw area on her buttocks from being in bed for so long in the hospital.  She feels significantly improved at this time.  Chronic headaches: These started after she had her scalp laceration.  She has been taking occasional Tylenol for this though cannot take the Tylenol given her recent liver injury.  She did have a MRI brain in September 2022 with no findings concerning for a mass lesion.  Social History   Tobacco Use  Smoking Status Former   Packs/day: 0.50   Years: 13.00   Pack years: 6.50   Types: Cigarettes   Quit date: 01/23/2000   Years since quitting: 21.5  Smokeless Tobacco Never    Current Outpatient Medications on File Prior to Visit  Medication Sig Dispense Refill   Ascorbic Acid (VITAMIN C) 1000 MG tablet Take 1,000 mg by mouth daily.     aspirin 81 MG tablet Take 81 mg by mouth daily.     B Complex Vitamins (VITAMIN B COMPLEX PO) Take 1 tablet by mouth daily.     Biotin 5 MG CAPS Take 5 mg by mouth daily.     calcium carbonate (OS-CAL) 600 MG TABS tablet Take 600 mg by mouth daily.      Cholecalciferol (VITAMIN D3) 50 MCG (2000 UT) capsule Take 2,000 Units by mouth daily.     diphenhydrAMINE (BENADRYL) 25 mg capsule Take 25 mg by mouth daily as needed for allergies.     docusate sodium (COLACE) 100 MG capsule Take 100 mg by mouth daily.     doxazosin  (CARDURA) 8 MG tablet Take 1 tablet by mouth twice daily 180 tablet 3   EPINEPHrine 0.3 mg/0.3 mL IJ SOAJ injection Inject 0.3 mLs (0.3 mg total) into the muscle once. 2 Device 0   fexofenadine (ALLEGRA) 180 MG tablet Take 180 mg by mouth daily as needed for allergies.     Flaxseed, Linseed, (FLAXSEED OIL PO) Take 1 capsule by mouth daily.     levothyroxine (SYNTHROID, LEVOTHROID) 125 MCG tablet Take 125 mcg by mouth daily before breakfast.     lithium carbonate (LITHOBID) 300 MG CR tablet Take 600 mg by mouth at bedtime.     magnesium oxide (MAG-OX) 400 MG tablet Take 400 mg by mouth daily.     Multiple Vitamins-Minerals (MULTIVITAMIN WITH MINERALS) tablet Take 1 tablet by mouth daily.     Multiple Vitamins-Minerals (VISION FORMULA/LUTEIN) TABS Take 1 tablet by mouth in the morning and at bedtime.     Omega-3 Fatty Acids (FISH OIL) 1000 MG CAPS Take 3,000 mg by mouth daily.     sulfamethoxazole-trimethoprim (BACTRIM DS) 800-160 MG tablet Take 1 tablet by mouth every 12 (twelve) hours. 3 tablet 0   vitamin B-12 (CYANOCOBALAMIN) 1000 MCG tablet Take 1,000 mcg by mouth daily.     vitamin E 1000 UNIT capsule Take 1,000 Units by mouth  daily.     zinc gluconate 50 MG tablet Take 50 mg by mouth daily.     ziprasidone (GEODON) 20 MG capsule Take 40 mg by mouth at bedtime.     No current facility-administered medications on file prior to visit.     ROS see history of present illness  Objective  Physical Exam Vitals:   08/09/21 1552  BP: 140/80  Pulse: 92  Temp: 98.5 F (36.9 C)  SpO2: 96%    BP Readings from Last 3 Encounters:  08/09/21 140/80  08/04/21 131/83  06/16/21 118/68   Wt Readings from Last 3 Encounters:  08/09/21 137 lb (62.1 kg)  07/26/21 139 lb 1.8 oz (63.1 kg)  06/16/21 139 lb 3.2 oz (63.1 kg)    Physical Exam Constitutional:      General: She is not in acute distress.    Appearance: She is not diaphoretic.  Cardiovascular:     Rate and Rhythm: Normal rate and  regular rhythm.     Heart sounds: Normal heart sounds.  Pulmonary:     Effort: Pulmonary effort is normal.     Breath sounds: Normal breath sounds.  Abdominal:     General: Bowel sounds are normal. There is no distension.     Palpations: Abdomen is soft.     Tenderness: There is no abdominal tenderness.  Skin:    General: Skin is warm and dry.     Comments: [name], CMA served as chaperone, slight erythema at the top of her gluteal cleft, no skin breakdown  Neurological:     Mental Status: She is alert.     Assessment/Plan: Please see individual problem list.  Problem List Items Addressed This Visit     Chronic headache    This is in the setting of a prior scalp laceration that essentially removed her scalp.  This was fixed in the ED.  These headaches may be related to nerve regeneration.  We will see what her hepatic function panel is today and then determine if she can resume Tylenol.      Diarrhea    Likely related to her antibiotic use.  Discussed eating yogurt and taking a probiotic to see if that would be beneficial.  If this is not helpful she will let us know.      Elevated LFTs    Likely related to her sepsis and injury to the liver from this.  We will recheck her liver enzymes today.      Relevant Orders   Comp Met (CMET)   Pressure injury of buttock, stage 1    Discussed barrier cream.  She will monitor and keep the area clean with soap and water.  If worsening or not healing she will let us know.      Severe sepsis ([place])    Significantly improved with appropriate antibiotic treatment.  She notes no symptoms at this time.  I advised there was not an indication to recheck her urine currently.  If she develops symptoms again she will let us know immediately.      Relevant Orders   CBC w/Diff    Return in about 3 months (around 11/09/2021).  This visit occurred during the SARS-CoV-2 public health emergency.  Safety protocols were in place, including  screening questions prior to the visit, additional usage of staff PPE, and extensive cleaning of exam room while observing appropriate contact time as indicated for disinfecting solutions.   I have spent 31 minutes in the care of  this patient regarding history taking, documentation, review of discharge summary and hospital lab work, discussion of plan, completion of exam, placing orders.   Amy Rumps, MD [place]

## 2021-08-09 NOTE — Patient Instructions (Addendum)
Nice to see you. We will get lab work today and contact you with the results. Please use a zinc oxide barrier cream on your buttocks to see if that will help the area heal.  You can cleanse the area with soap and water on a daily basis. Please try to take in yogurt and or probiotics to help with your gut. Align is a good probiotic to try.

## 2021-08-09 NOTE — Assessment & Plan Note (Signed)
Significantly improved with appropriate antibiotic treatment.  She notes no symptoms at this time.  I advised there was not an indication to recheck her urine currently.  If she develops symptoms again she will let us know immediately.

## 2021-08-09 NOTE — Assessment & Plan Note (Signed)
This is in the setting of a prior scalp laceration that essentially removed her scalp.  This was fixed in the ED.  These headaches may be related to nerve regeneration.  We will see what her hepatic function panel is today and then determine if she can resume Tylenol.

## 2021-08-09 NOTE — Assessment & Plan Note (Signed)
Likely related to her antibiotic use.  Discussed eating yogurt and taking a probiotic to see if that would be beneficial.  If this is not helpful she will let us know.

## 2021-08-09 NOTE — Assessment & Plan Note (Signed)
Discussed barrier cream.  She will monitor and keep the area clean with soap and water.  If worsening or not healing she will let us know.

## 2021-08-09 NOTE — Assessment & Plan Note (Signed)
Likely related to her sepsis and injury to the liver from this.  We will recheck her liver enzymes today.

## 2021-08-10 LAB — CBC WITH DIFFERENTIAL/PLATELET
Basophils Absolute: 0.1 10*3/uL (ref 0.0–0.1)
Basophils Relative: 1.1 % (ref 0.0–3.0)
Eosinophils Absolute: 0.1 10*3/uL (ref 0.0–0.7)
Eosinophils Relative: 1 % (ref 0.0–5.0)
HCT: 31.1 % — ABNORMAL LOW (ref 36.0–46.0)
Hemoglobin: 10.1 g/dL — ABNORMAL LOW (ref 12.0–15.0)
Lymphocytes Relative: 30 % (ref 12.0–46.0)
Lymphs Abs: 2.4 10*3/uL (ref 0.7–4.0)
MCHC: 32.4 g/dL (ref 30.0–36.0)
MCV: 86.7 fl (ref 78.0–100.0)
Monocytes Absolute: 0.7 10*3/uL (ref 0.1–1.0)
Monocytes Relative: 8.8 % (ref 3.0–12.0)
Neutro Abs: 4.8 10*3/uL (ref 1.4–7.7)
Neutrophils Relative %: 59.1 % (ref 43.0–77.0)
Platelets: 379 10*3/uL (ref 150.0–400.0)
RBC: 3.59 Mil/uL — ABNORMAL LOW (ref 3.87–5.11)
RDW: 15 % (ref 11.5–15.5)
WBC: 8.1 10*3/uL (ref 4.0–10.5)

## 2021-08-10 LAB — COMPREHENSIVE METABOLIC PANEL
ALT: 92 U/L — ABNORMAL HIGH (ref 0–35)
AST: 31 U/L (ref 0–37)
Albumin: 3.9 g/dL (ref 3.5–5.2)
Alkaline Phosphatase: 173 U/L — ABNORMAL HIGH (ref 39–117)
BUN: 26 mg/dL — ABNORMAL HIGH (ref 6–23)
CO2: 28 mEq/L (ref 19–32)
Calcium: 9.7 mg/dL (ref 8.4–10.5)
Chloride: 101 mEq/L (ref 96–112)
Creatinine, Ser: 0.95 mg/dL (ref 0.40–1.20)
GFR: 60.59 mL/min (ref >60.00)
Glucose, Bld: 88 mg/dL (ref 70–99)
Potassium: 4.1 mEq/L (ref 3.5–5.1)
Sodium: 136 mEq/L (ref 135–145)
Total Bilirubin: 0.4 mg/dL (ref 0.2–1.2)
Total Protein: 7.1 g/dL (ref 6.0–8.3)

## 2021-08-12 ENCOUNTER — Other Ambulatory Visit: Payer: Self-pay | Admitting: Family Medicine

## 2021-08-12 DIAGNOSIS — R7989 Other specified abnormal findings of blood chemistry: Secondary | ICD-10-CM

## 2021-08-12 DIAGNOSIS — D649 Anemia, unspecified: Secondary | ICD-10-CM

## 2021-08-14 DIAGNOSIS — R7401 Elevation of levels of liver transaminase levels: Secondary | ICD-10-CM | POA: Diagnosis not present

## 2021-08-14 DIAGNOSIS — A4151 Sepsis due to Escherichia coli [E. coli]: Secondary | ICD-10-CM | POA: Diagnosis not present

## 2021-08-14 DIAGNOSIS — E039 Hypothyroidism, unspecified: Secondary | ICD-10-CM | POA: Diagnosis not present

## 2021-08-14 DIAGNOSIS — E871 Hypo-osmolality and hyponatremia: Secondary | ICD-10-CM | POA: Diagnosis not present

## 2021-08-14 DIAGNOSIS — N390 Urinary tract infection, site not specified: Secondary | ICD-10-CM | POA: Diagnosis not present

## 2021-08-14 DIAGNOSIS — F319 Bipolar disorder, unspecified: Secondary | ICD-10-CM | POA: Diagnosis not present

## 2021-08-14 DIAGNOSIS — N179 Acute kidney failure, unspecified: Secondary | ICD-10-CM | POA: Diagnosis not present

## 2021-08-14 DIAGNOSIS — I10 Essential (primary) hypertension: Secondary | ICD-10-CM | POA: Diagnosis not present

## 2021-08-14 DIAGNOSIS — D649 Anemia, unspecified: Secondary | ICD-10-CM | POA: Diagnosis not present

## 2021-08-17 ENCOUNTER — Telehealth: Payer: Self-pay | Admitting: Family Medicine

## 2021-08-17 NOTE — Telephone Encounter (Signed)
error 

## 2021-08-18 ENCOUNTER — Inpatient Hospital Stay: Payer: Medicare HMO | Admitting: Family

## 2021-08-22 ENCOUNTER — Telehealth: Payer: Self-pay | Admitting: Family Medicine

## 2021-08-22 NOTE — Telephone Encounter (Signed)
Order was faxed today to [facility].  Confirmation given.  Keagan Brislin,cma

## 2021-08-22 NOTE — Telephone Encounter (Signed)
Maggie from [facility] [employer] is calling to check on the status of orders they faxed over for the patient yesterday.She stated they are requesting orders for physical therapy 1 time a week for 5 weeks and 1 time a week every two weeks for 4 weeks.Please call her at [phone].

## 2021-08-23 ENCOUNTER — Other Ambulatory Visit: Payer: Medicare HMO

## 2021-08-25 DIAGNOSIS — E871 Hypo-osmolality and hyponatremia: Secondary | ICD-10-CM | POA: Diagnosis not present

## 2021-08-25 DIAGNOSIS — R7401 Elevation of levels of liver transaminase levels: Secondary | ICD-10-CM | POA: Diagnosis not present

## 2021-08-25 DIAGNOSIS — E039 Hypothyroidism, unspecified: Secondary | ICD-10-CM | POA: Diagnosis not present

## 2021-08-25 DIAGNOSIS — N390 Urinary tract infection, site not specified: Secondary | ICD-10-CM | POA: Diagnosis not present

## 2021-08-25 DIAGNOSIS — D649 Anemia, unspecified: Secondary | ICD-10-CM | POA: Diagnosis not present

## 2021-08-25 DIAGNOSIS — I10 Essential (primary) hypertension: Secondary | ICD-10-CM | POA: Diagnosis not present

## 2021-08-25 DIAGNOSIS — A4151 Sepsis due to Escherichia coli [E. coli]: Secondary | ICD-10-CM | POA: Diagnosis not present

## 2021-08-25 DIAGNOSIS — F319 Bipolar disorder, unspecified: Secondary | ICD-10-CM | POA: Diagnosis not present

## 2021-08-25 DIAGNOSIS — N179 Acute kidney failure, unspecified: Secondary | ICD-10-CM | POA: Diagnosis not present

## 2021-08-28 ENCOUNTER — Other Ambulatory Visit: Payer: Medicare HMO

## 2021-08-28 DIAGNOSIS — R7401 Elevation of levels of liver transaminase levels: Secondary | ICD-10-CM | POA: Diagnosis not present

## 2021-08-28 DIAGNOSIS — D649 Anemia, unspecified: Secondary | ICD-10-CM | POA: Diagnosis not present

## 2021-08-28 DIAGNOSIS — E039 Hypothyroidism, unspecified: Secondary | ICD-10-CM | POA: Diagnosis not present

## 2021-08-28 DIAGNOSIS — N179 Acute kidney failure, unspecified: Secondary | ICD-10-CM | POA: Diagnosis not present

## 2021-08-28 DIAGNOSIS — N390 Urinary tract infection, site not specified: Secondary | ICD-10-CM | POA: Diagnosis not present

## 2021-08-28 DIAGNOSIS — A4151 Sepsis due to Escherichia coli [E. coli]: Secondary | ICD-10-CM | POA: Diagnosis not present

## 2021-08-28 DIAGNOSIS — F319 Bipolar disorder, unspecified: Secondary | ICD-10-CM | POA: Diagnosis not present

## 2021-08-28 DIAGNOSIS — E871 Hypo-osmolality and hyponatremia: Secondary | ICD-10-CM | POA: Diagnosis not present

## 2021-08-28 DIAGNOSIS — I10 Essential (primary) hypertension: Secondary | ICD-10-CM | POA: Diagnosis not present

## 2021-08-29 ENCOUNTER — Telehealth: Payer: Self-pay | Admitting: Family Medicine

## 2021-08-29 NOTE — Telephone Encounter (Signed)
Patient called in stating that she has been waking up in a cold sweat the past 3 nights and would like to know if there is a lab she can have added on to her labs Friday to check her electrolytes.Please call her at [phone].  Landry Lookingbill,cma

## 2021-08-29 NOTE — Telephone Encounter (Signed)
Patient called in stating that she has been waking up in a cold sweat the past 3 nights and would like to know if there is a lab she can have added on to her labs Friday to check her electrolytes.Please call her at [phone].

## 2021-08-29 NOTE — Telephone Encounter (Signed)
Please find out if she is having any other symptoms.  Is she having any fevers, cough, congestion, abdominal pain, diarrhea, nausea, vomiting, burning with urination, or any other symptoms at all?  We may have to complete a visit to fully determine what labs would be appropriate.

## 2021-08-29 NOTE — Telephone Encounter (Signed)
I called and spoke with the patient and she was in the dentist office and will cal tomorrow to schedule an visit with the provider, she is having no other symptoms at all.  Duard Spiewak,cma

## 2021-08-31 NOTE — Telephone Encounter (Signed)
LMTCB

## 2021-08-31 NOTE — Telephone Encounter (Signed)
Pt stated that she was actually doing well. She said that she had been sleeping in a flannel gown with a heater since weather became cooler at night. She said that she lowered the thermostat, put on a lighter weight gown & lighter blanket on her bed. She was feeling better with no sweats. She confirmed no fever, diarrhea, congestion, burning with urination, vomiting or nausea.

## 2021-08-31 NOTE — Telephone Encounter (Signed)
Noted.  There is no need for further work-up of this at this time with her improved symptoms.  If she has recurrent symptoms she should let us know.

## 2021-09-01 ENCOUNTER — Other Ambulatory Visit: Payer: Self-pay

## 2021-09-01 ENCOUNTER — Other Ambulatory Visit (INDEPENDENT_AMBULATORY_CARE_PROVIDER_SITE_OTHER): Payer: Medicare HMO

## 2021-09-01 DIAGNOSIS — R7989 Other specified abnormal findings of blood chemistry: Secondary | ICD-10-CM | POA: Diagnosis not present

## 2021-09-01 DIAGNOSIS — E785 Hyperlipidemia, unspecified: Secondary | ICD-10-CM | POA: Diagnosis not present

## 2021-09-01 DIAGNOSIS — D649 Anemia, unspecified: Secondary | ICD-10-CM

## 2021-09-01 LAB — CBC
HCT: 36 % (ref 36.0–46.0)
Hemoglobin: 11.2 g/dL — ABNORMAL LOW (ref 12.0–15.0)
MCHC: 31.1 g/dL (ref 30.0–36.0)
MCV: 86.1 fl (ref 78.0–100.0)
Platelets: 268 10*3/uL (ref 150.0–400.0)
RBC: 4.18 Mil/uL (ref 3.87–5.11)
RDW: 16.3 % — ABNORMAL HIGH (ref 11.5–15.5)
WBC: 7.2 10*3/uL (ref 4.0–10.5)

## 2021-09-01 LAB — HEPATIC FUNCTION PANEL
ALT: 33 U/L (ref 0–35)
AST: 27 U/L (ref 0–37)
Albumin: 4.1 g/dL (ref 3.5–5.2)
Alkaline Phosphatase: 107 U/L (ref 39–117)
Bilirubin, Direct: 0.1 mg/dL (ref 0.0–0.3)
Total Bilirubin: 0.4 mg/dL (ref 0.2–1.2)
Total Protein: 7.9 g/dL (ref 6.0–8.3)

## 2021-09-01 LAB — LDL CHOLESTEROL, DIRECT: Direct LDL: 108 mg/dL

## 2021-09-04 DIAGNOSIS — E039 Hypothyroidism, unspecified: Secondary | ICD-10-CM | POA: Diagnosis not present

## 2021-09-04 DIAGNOSIS — F319 Bipolar disorder, unspecified: Secondary | ICD-10-CM | POA: Diagnosis not present

## 2021-09-04 DIAGNOSIS — E871 Hypo-osmolality and hyponatremia: Secondary | ICD-10-CM | POA: Diagnosis not present

## 2021-09-04 DIAGNOSIS — N179 Acute kidney failure, unspecified: Secondary | ICD-10-CM | POA: Diagnosis not present

## 2021-09-04 DIAGNOSIS — R7401 Elevation of levels of liver transaminase levels: Secondary | ICD-10-CM | POA: Diagnosis not present

## 2021-09-04 DIAGNOSIS — D649 Anemia, unspecified: Secondary | ICD-10-CM | POA: Diagnosis not present

## 2021-09-04 DIAGNOSIS — A4151 Sepsis due to Escherichia coli [E. coli]: Secondary | ICD-10-CM | POA: Diagnosis not present

## 2021-09-04 DIAGNOSIS — I10 Essential (primary) hypertension: Secondary | ICD-10-CM | POA: Diagnosis not present

## 2021-09-04 DIAGNOSIS — N390 Urinary tract infection, site not specified: Secondary | ICD-10-CM | POA: Diagnosis not present

## 2021-09-07 ENCOUNTER — Telehealth: Payer: Self-pay | Admitting: Family Medicine

## 2021-09-07 ENCOUNTER — Telehealth: Payer: Self-pay | Admitting: Cardiovascular Disease

## 2021-09-07 DIAGNOSIS — M79674 Pain in right toe(s): Secondary | ICD-10-CM

## 2021-09-07 NOTE — Telephone Encounter (Signed)
Pt stated she broke her 4th toe on the right foot years ago and now it's causing her pain. Pt was wanting a referral to her previous podiatrist. Attempted to get her a virtual visit with another provider but pt insisted she will just use her appointment in December 10/16/21 at 1:45 to be seen by Dr. Josephina Gip.   Callback # [phone]

## 2021-09-07 NOTE — Telephone Encounter (Signed)
To Dr. Rockey Situ to review. The patient was hospitalized from 07/26/21-08/04/21 for severe sepsis.

## 2021-09-07 NOTE — Telephone Encounter (Signed)
Pt c/o medication issue:  1. Name of Medication: doxazosin 8 mg po BID   2. How are you currently taking this medication (dosage and times per day)? See above   3. Are you having a reaction (difficulty breathing--STAT)? No doing well   4. What is your medication issue? Patient hospitalized late September and changed at dc to doxazosin and stopped her amlodipine and propanolol   Patient reports she is doing fine but just wants Dr. Rockey Situ to be aware and ok these changes .

## 2021-09-08 ENCOUNTER — Telehealth: Payer: Self-pay | Admitting: Family Medicine

## 2021-09-08 NOTE — Telephone Encounter (Signed)
I called patient & let her know that referral was placed. I advised that she may want to call over there to see if they can get her scheduled ahead of them calling her. Pt stated that she would do so.

## 2021-09-08 NOTE — Telephone Encounter (Signed)
I called the patient and informed her that she would need an appointment and she is scheduled for next week. Magdalina Whitehead,cma

## 2021-09-08 NOTE — Telephone Encounter (Signed)
Pt is needing a pain medication that is not motrin, advil, aspirin, etc. but similar. Pt spoke with pharmacy at [facility] and they suggested she shouldn't take anything like these medications because it could potentially cause her lithium levels to rise. Pt was wondering if she could have a prescription in for pain relief that wouldn't increase her lithium levels. Pt was wondering if she should make an appointment to be seen for this or could a prescription be written in. Pt states she needs something for her occasional aches. Pt uses [facility] on [address]

## 2021-09-08 NOTE — Telephone Encounter (Signed)
Pt stated she broke her 4th toe on the right foot years ago and now it's causing her pain. Pt was wanting a referral to her previous podiatrist. The patient  previously saw Dr. Guy Sandifer at [facility] in [place].  Corban Kistler,cma

## 2021-09-08 NOTE — Telephone Encounter (Signed)
Call pt Podiatry referral placed Let us know if you dont hear back within a week in regards to an appointment being scheduled.

## 2021-09-08 NOTE — Telephone Encounter (Signed)
a 

## 2021-09-11 NOTE — Telephone Encounter (Signed)
Was able to reach back out to [name] to f/u on her BP meds, Dr. Rockey Situ reviewed recent hospital admission and advised  Would closely monitor pressures and call with numbers  Cardura dosing is high  TGollan   Amy Mcconnell verbalized understanding, reports her BP has been "great", seen PCP and stated "my Bp has been good". Pt reports will call with any concerns with BP fluctuation or other cardiac symptoms, but very grateful for getting back with her, nothing further at this time.

## 2021-09-12 DIAGNOSIS — D649 Anemia, unspecified: Secondary | ICD-10-CM | POA: Diagnosis not present

## 2021-09-12 DIAGNOSIS — I10 Essential (primary) hypertension: Secondary | ICD-10-CM | POA: Diagnosis not present

## 2021-09-12 DIAGNOSIS — E871 Hypo-osmolality and hyponatremia: Secondary | ICD-10-CM | POA: Diagnosis not present

## 2021-09-12 DIAGNOSIS — F319 Bipolar disorder, unspecified: Secondary | ICD-10-CM | POA: Diagnosis not present

## 2021-09-12 DIAGNOSIS — R7401 Elevation of levels of liver transaminase levels: Secondary | ICD-10-CM | POA: Diagnosis not present

## 2021-09-12 DIAGNOSIS — A4151 Sepsis due to Escherichia coli [E. coli]: Secondary | ICD-10-CM | POA: Diagnosis not present

## 2021-09-12 DIAGNOSIS — N179 Acute kidney failure, unspecified: Secondary | ICD-10-CM | POA: Diagnosis not present

## 2021-09-12 DIAGNOSIS — E039 Hypothyroidism, unspecified: Secondary | ICD-10-CM | POA: Diagnosis not present

## 2021-09-12 DIAGNOSIS — N390 Urinary tract infection, site not specified: Secondary | ICD-10-CM | POA: Diagnosis not present

## 2021-09-13 ENCOUNTER — Ambulatory Visit: Payer: Medicare HMO | Admitting: Podiatry

## 2021-09-13 ENCOUNTER — Encounter (INDEPENDENT_AMBULATORY_CARE_PROVIDER_SITE_OTHER): Payer: Self-pay

## 2021-09-13 ENCOUNTER — Ambulatory Visit: Payer: Medicare HMO

## 2021-09-13 ENCOUNTER — Other Ambulatory Visit: Payer: Self-pay

## 2021-09-13 ENCOUNTER — Ambulatory Visit (INDEPENDENT_AMBULATORY_CARE_PROVIDER_SITE_OTHER): Payer: Medicare HMO | Admitting: Family Medicine

## 2021-09-13 ENCOUNTER — Encounter: Payer: Self-pay | Admitting: Family Medicine

## 2021-09-13 ENCOUNTER — Ambulatory Visit (INDEPENDENT_AMBULATORY_CARE_PROVIDER_SITE_OTHER): Payer: Medicare HMO

## 2021-09-13 VITALS — BP 130/80 | HR 74 | Temp 98.7°F | Ht 67.0 in | Wt 146.4 lb

## 2021-09-13 DIAGNOSIS — R61 Generalized hyperhidrosis: Secondary | ICD-10-CM | POA: Diagnosis not present

## 2021-09-13 DIAGNOSIS — S0101XD Laceration without foreign body of scalp, subsequent encounter: Secondary | ICD-10-CM | POA: Diagnosis not present

## 2021-09-13 DIAGNOSIS — N179 Acute kidney failure, unspecified: Secondary | ICD-10-CM | POA: Diagnosis not present

## 2021-09-13 DIAGNOSIS — N390 Urinary tract infection, site not specified: Secondary | ICD-10-CM | POA: Diagnosis not present

## 2021-09-13 DIAGNOSIS — M2041 Other hammer toe(s) (acquired), right foot: Secondary | ICD-10-CM | POA: Diagnosis not present

## 2021-09-13 DIAGNOSIS — E871 Hypo-osmolality and hyponatremia: Secondary | ICD-10-CM | POA: Diagnosis not present

## 2021-09-13 DIAGNOSIS — M79644 Pain in right finger(s): Secondary | ICD-10-CM | POA: Diagnosis not present

## 2021-09-13 DIAGNOSIS — E039 Hypothyroidism, unspecified: Secondary | ICD-10-CM | POA: Diagnosis not present

## 2021-09-13 DIAGNOSIS — M1991 Primary osteoarthritis, unspecified site: Secondary | ICD-10-CM | POA: Diagnosis not present

## 2021-09-13 DIAGNOSIS — Z8781 Personal history of (healed) traumatic fracture: Secondary | ICD-10-CM

## 2021-09-13 DIAGNOSIS — R7401 Elevation of levels of liver transaminase levels: Secondary | ICD-10-CM | POA: Diagnosis not present

## 2021-09-13 DIAGNOSIS — F319 Bipolar disorder, unspecified: Secondary | ICD-10-CM | POA: Diagnosis not present

## 2021-09-13 DIAGNOSIS — I10 Essential (primary) hypertension: Secondary | ICD-10-CM | POA: Diagnosis not present

## 2021-09-13 DIAGNOSIS — D649 Anemia, unspecified: Secondary | ICD-10-CM | POA: Diagnosis not present

## 2021-09-13 DIAGNOSIS — A4151 Sepsis due to Escherichia coli [E. coli]: Secondary | ICD-10-CM | POA: Diagnosis not present

## 2021-09-13 DIAGNOSIS — M1990 Unspecified osteoarthritis, unspecified site: Secondary | ICD-10-CM | POA: Insufficient documentation

## 2021-09-13 NOTE — Assessment & Plan Note (Signed)
This started after a fall.  The fall was in the setting of passing a kidney stone.  We will get an x-ray to evaluate this next week given that we are testing her for TB.

## 2021-09-13 NOTE — Progress Notes (Signed)
  Subjective:  Patient ID: Amy Mcconnell, female    DOB: 04/08/1951,  MRN: [id number]  Chief Complaint  Patient presents with   Toe Pain      Deformed 4th right toe referral from PCP    70 y.o. female presents with the above complaint. History confirmed with patient.  Having pain in her fourth toe that is been worsening.  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  She previously broke the right fourth toe  Objective:  Physical Exam: warm, good capillary refill, no trophic changes or ulcerative lesions, normal DP and PT pulses, and normal sensory exam.  Right Foot: Fourth toe painful, distal tip callus and limited range of motion of the PIPJ  No images are attached to the encounter.  Radiographs: Multiple views x-ray of the right foot: Hammertoe deformity, no obvious fracture but there is malalignment of the fourth PIPJ with arthritic changes Assessment:   1. History of fracture of phalanx of toe      Plan:  Patient was evaluated and treated and all questions answered.  Reviewed the radiographic and clinical exam findings with the patient.  I discussed surgical and nonsurgical treatment.  Think she would benefit from an arthroplasty and possibly a flexor tenotomy of the fourth PIPJ.  I reviewed the risk benefits and potential complications of this with her.  She understands and wishes to proceed.  Informed consent was signed and reviewed.  Surgery will be scheduled for January I dispensed her silicone offloading pads until then to see if this will alleviate the callus   Surgical plan:  Procedure: -Right fourth toe arthroplasty possible flexor tenotomy  Location: -[facility]   Anesthesia plan: -IV sedation with local  Postoperative pain plan: - Tylenol 1000 mg every 6 hours, gabapentin 300 mg every 8 hours x5 days, oxycodone 5 mg 1-2 tabs every 6 hours only as needed  DVT prophylaxis: -None required  WB Restrictions / DME needs: -She has a  surgical shoe and will bring this to surgery   No follow-ups on file.

## 2021-09-13 NOTE — Patient Instructions (Signed)
Nice to see you. We are going to get some lab work today and we will contact you with the results. You will return early next week for your hand x-ray.

## 2021-09-13 NOTE — Assessment & Plan Note (Signed)
The patient has had ongoing issues with night sweats.  Discussed the potential causes for this including thyroid dysfunction, cancer, TB, or hormonal issues.  We are going to check lab work as outlined.  I have a low suspicion for TB

## 2021-09-13 NOTE — Assessment & Plan Note (Signed)
Chronic issues with intermittent discomfort from arthritis.  Many over-the-counter medications are limited for her given current medications and other medical issues. I will consult with our pharmacist to see what options we might have for pain management for this patient.

## 2021-09-13 NOTE — Assessment & Plan Note (Signed)
Has recovered quite well from this previous injury.  She does have some discomfort at times likely related to nerve regeneration.  She will monitor.

## 2021-09-13 NOTE — Progress Notes (Signed)
Tommi Rumps, MD Phone: [phone]  Amy Mcconnell is a 70 y.o. female who presents today for f/u.  Arthritic pain: Patient notes she cannot take Tylenol related to liver damage from when she had sepsis.  She cannot take aspirin or ibuprofen related to being on lithium.  She wonders about what she can take.  Scalp injury: Patient reports a degloving injury previously.  Notes the sensation in her scalp has started to return and when she brushes her hair she oftentimes gets headaches.  Does not often occur.  Right fourth finger injury: This occurred when she had a fall back in September.  She had no syncope.  She was passing a kidney stone and fell forward after she got up off the toilet.  She feels as though the finger is not in alignment and the PIP joint is bothersome.  Night sweats: The patient notes these been going on for about a month.  They are drenching.  They occur about 3 times a week.  She notes no cough, shortness of breath, fevers, weight loss, vomiting, diarrhea, or blood.  She notes no TB exposure.  Her mammogram is up-to-date.  She is due for colonoscopy.  She notes no history of abnormal Pap smears.  She had an MRI brain in September that was unremarkable for any contributing factor to her night sweats.  She had a chest x-ray at the same time that revealed no active disease.  She had a CT abdomen and pelvis that revealed small bilateral adrenal gland nodules that were likely small adenomas.  She had numerous renal cysts.  There were findings consistent with recently passed kidney stone.  There is no overt adenopathy.  Social History   Tobacco Use  Smoking Status Former   Packs/day: 0.50   Years: 13.00   Pack years: 6.50   Types: Cigarettes   Quit date: 01/23/2000   Years since quitting: 21.6  Smokeless Tobacco Never    Current Outpatient Medications on File Prior to Visit  Medication Sig Dispense Refill   Ascorbic Acid (VITAMIN C) 1000 MG tablet Take 1,000 mg by  mouth daily.     aspirin 81 MG tablet Take 81 mg by mouth daily.     B Complex Vitamins (VITAMIN B COMPLEX PO) Take 1 tablet by mouth daily.     Biotin 5 MG CAPS Take 5 mg by mouth daily.     calcium carbonate (OS-CAL) 600 MG TABS tablet Take 600 mg by mouth daily.      Cholecalciferol (VITAMIN D3) 50 MCG (2000 UT) capsule Take 2,000 Units by mouth daily.     diphenhydrAMINE (BENADRYL) 25 mg capsule Take 25 mg by mouth daily as needed for allergies.     docusate sodium (COLACE) 100 MG capsule Take 100 mg by mouth daily.     doxazosin (CARDURA) 8 MG tablet Take 1 tablet by mouth twice daily 180 tablet 3   EPINEPHrine 0.3 mg/0.3 mL IJ SOAJ injection Inject 0.3 mLs (0.3 mg total) into the muscle once. 2 Device 0   fexofenadine (ALLEGRA) 180 MG tablet Take 180 mg by mouth daily as needed for allergies.     Flaxseed, Linseed, (FLAXSEED OIL PO) Take 1 capsule by mouth daily.     levothyroxine (SYNTHROID, LEVOTHROID) 125 MCG tablet Take 125 mcg by mouth daily before breakfast.     lithium carbonate (LITHOBID) 300 MG CR tablet Take 600 mg by mouth at bedtime.     magnesium oxide (MAG-OX) 400 MG tablet Take  400 mg by mouth daily.     Multiple Vitamins-Minerals (MULTIVITAMIN WITH MINERALS) tablet Take 1 tablet by mouth daily.     Multiple Vitamins-Minerals (VISION FORMULA/LUTEIN) TABS Take 1 tablet by mouth in the morning and at bedtime.     Omega-3 Fatty Acids (FISH OIL) 1000 MG CAPS Take 3,000 mg by mouth daily.     sulfamethoxazole-trimethoprim (BACTRIM DS) 800-160 MG tablet Take 1 tablet by mouth every 12 (twelve) hours. 3 tablet 0   vitamin B-12 (CYANOCOBALAMIN) 1000 MCG tablet Take 1,000 mcg by mouth daily.     vitamin E 1000 UNIT capsule Take 1,000 Units by mouth daily.     zinc gluconate 50 MG tablet Take 50 mg by mouth daily.     ziprasidone (GEODON) 20 MG capsule Take 40 mg by mouth at bedtime.     No current facility-administered medications on file prior to visit.     ROS see history of  present illness  Objective  Physical Exam Vitals:   09/13/21 1314 09/13/21 1348  BP: (!) 150/81 130/80  Pulse: 74   Temp: 98.7 F (37.1 C)   SpO2: 97%     BP Readings from Last 3 Encounters:  09/13/21 130/80  08/09/21 140/80  08/04/21 131/83   Wt Readings from Last 3 Encounters:  09/13/21 146 lb 6.4 oz (66.4 kg)  08/09/21 137 lb (62.1 kg)  07/26/21 139 lb 1.8 oz (63.1 kg)    Physical Exam Constitutional:      General: She is not in acute distress.    Appearance: She is not diaphoretic.  Cardiovascular:     Rate and Rhythm: Normal rate and regular rhythm.     Heart sounds: Normal heart sounds.  Pulmonary:     Effort: Pulmonary effort is normal.     Breath sounds: Normal breath sounds.  Abdominal:     General: Bowel sounds are normal. There is no distension.     Palpations: Abdomen is soft.     Tenderness: There is no abdominal tenderness. There is no guarding or rebound.  Musculoskeletal:     Comments: Right fourth DIP joint with some slight swelling and tenderness, no erythema or warmth, she has good flexion and extension of the fourth digit on the right hand  Lymphadenopathy:     Cervical: No cervical adenopathy.  Skin:    General: Skin is warm and dry.  Neurological:     Mental Status: She is alert.     Assessment/Plan: Please see individual problem list.  Problem List Items Addressed This Visit     Night sweats - Primary    The patient has had ongoing issues with night sweats.  Discussed the potential causes for this including thyroid dysfunction, cancer, TB, or hormonal issues.  We are going to check lab work as outlined.  I have a low suspicion for TB      Relevant Orders   Comp Met (CMET)   CBC w/Diff   Sedimentation rate   QuantiFERON-TB Gold Plus   TSH   Osteoarthritis    Chronic issues with intermittent discomfort from arthritis.  Many over-the-counter medications are limited for her given current medications and other medical issues. I will  consult with our pharmacist to see what options we might have for pain management for this patient.       Pain of finger of right hand    This started after a fall.  The fall was in the setting of passing a kidney stone.  We  will get an x-ray to evaluate this next week given that we are testing her for TB.      Relevant Orders   DG Hand Complete Right   Scalp laceration    Has recovered quite well from this previous injury.  She does have some discomfort at times likely related to nerve regeneration.  She will monitor.       Return in about 1 week (around 09/20/2021) for For an x-ray, 6 months PCP.  This visit occurred during the SARS-CoV-2 public health emergency.  Safety protocols were in place, including screening questions prior to the visit, additional usage of staff PPE, and extensive cleaning of exam room while observing appropriate contact time as indicated for disinfecting solutions.   I have spent 32 minutes in the care of this patient regarding history taking, documentation, coordination of lab work, placing orders, completion of exam.   Tommi Rumps, MD [place]

## 2021-09-14 LAB — CBC WITH DIFFERENTIAL/PLATELET
Basophils Absolute: 0 10*3/uL (ref 0.0–0.1)
Basophils Relative: 0.3 % (ref 0.0–3.0)
Eosinophils Absolute: 0.3 10*3/uL (ref 0.0–0.7)
Eosinophils Relative: 4.5 % (ref 0.0–5.0)
HCT: 36.3 % (ref 36.0–46.0)
Hemoglobin: 11.5 g/dL — ABNORMAL LOW (ref 12.0–15.0)
Lymphocytes Relative: 19.2 % (ref 12.0–46.0)
Lymphs Abs: 1.2 10*3/uL (ref 0.7–4.0)
MCHC: 31.6 g/dL (ref 30.0–36.0)
MCV: 84.6 fl (ref 78.0–100.0)
Monocytes Absolute: 0.5 10*3/uL (ref 0.1–1.0)
Monocytes Relative: 7.5 % (ref 3.0–12.0)
Neutro Abs: 4.2 10*3/uL (ref 1.4–7.7)
Neutrophils Relative %: 68.5 % (ref 43.0–77.0)
Platelets: 183 10*3/uL (ref 150.0–400.0)
RBC: 4.29 Mil/uL (ref 3.87–5.11)
RDW: 16.3 % — ABNORMAL HIGH (ref 11.5–15.5)
WBC: 6.1 10*3/uL (ref 4.0–10.5)

## 2021-09-14 LAB — COMPREHENSIVE METABOLIC PANEL
ALT: 30 U/L (ref 0–35)
AST: 28 U/L (ref 0–37)
Albumin: 4.4 g/dL (ref 3.5–5.2)
Alkaline Phosphatase: 100 U/L (ref 39–117)
BUN: 25 mg/dL — ABNORMAL HIGH (ref 6–23)
CO2: 28 mEq/L (ref 19–32)
Calcium: 9.4 mg/dL (ref 8.4–10.5)
Chloride: 104 mEq/L (ref 96–112)
Creatinine, Ser: 0.96 mg/dL (ref 0.40–1.20)
GFR: 59.79 mL/min — ABNORMAL LOW (ref >60.00)
Glucose, Bld: 75 mg/dL (ref 70–99)
Potassium: 4.4 mEq/L (ref 3.5–5.1)
Sodium: 138 mEq/L (ref 135–145)
Total Bilirubin: 0.4 mg/dL (ref 0.2–1.2)
Total Protein: 7.3 g/dL (ref 6.0–8.3)

## 2021-09-14 LAB — SEDIMENTATION RATE: Sed Rate: 41 mm/hr — ABNORMAL HIGH (ref 0–30)

## 2021-09-14 LAB — TSH: TSH: 1.67 u[IU]/mL (ref 0.35–5.50)

## 2021-09-15 ENCOUNTER — Ambulatory Visit: Payer: Medicare HMO

## 2021-09-16 LAB — QUANTIFERON-TB GOLD PLUS
Mitogen-NIL: >10 IU/mL
NIL: 0.04 IU/mL
QuantiFERON-TB Gold Plus: NEGATIVE
TB1-NIL: 0 IU/mL
TB2-NIL: 0.03 IU/mL

## 2021-09-19 ENCOUNTER — Other Ambulatory Visit: Payer: Self-pay

## 2021-09-19 ENCOUNTER — Ambulatory Visit: Payer: Medicare HMO | Attending: Internal Medicine

## 2021-09-19 DIAGNOSIS — Z23 Encounter for immunization: Secondary | ICD-10-CM

## 2021-09-19 MED ORDER — MODERNA COVID-19 BIVAL BOOSTER 50 MCG/0.5ML IM SUSP
INTRAMUSCULAR | 0 refills | Status: DC
Start: 2021-09-19 — End: 2022-01-04
  Filled 2021-09-19: qty 0.5, 1d supply, fill #0

## 2021-09-19 NOTE — Progress Notes (Signed)
I called and spoke with the patient and informed her of her lab results.  Patient stated she does have the night sweats but not every night, its every once in a while. Patient understood that her kidneys would be monitored closely.  Amy Mcconnell,cma

## 2021-09-19 NOTE — Progress Notes (Signed)
   Covid-19 Vaccination Clinic  Name:  Amy Mcconnell    MRN: [id number] DOB: [birth year]  09/19/2021  Amy Mcconnell was observed post Covid-19 immunization for 15 minutes without incident. She was provided with Vaccine Information Sheet and instruction to access the V-Safe system.   Amy Mcconnell was instructed to call 911 with any severe reactions post vaccine: Difficulty breathing  Swelling of face and throat  A fast heartbeat  A bad rash all over body  Dizziness and weakness   Immunizations Administered     Name Date Dose VIS Date Route   Moderna Covid-19 vaccine Bivalent Booster 09/19/2021  9:39 AM 0.5 mL 06/17/2021 Intramuscular   Manufacturer: Levan Hur[employer]   Lot: [id number]   NDC: 80165-537-48      Lu Duffel, PharmD, MBA Clinical Acute Care Pharmacist

## 2021-09-20 ENCOUNTER — Other Ambulatory Visit: Payer: Self-pay | Admitting: Family Medicine

## 2021-09-20 DIAGNOSIS — E785 Hyperlipidemia, unspecified: Secondary | ICD-10-CM

## 2021-09-20 MED ORDER — ATORVASTATIN CALCIUM 20 MG PO TABS: 20.0000 mg | ORAL_TABLET | Freq: Every day | ORAL | 3 refills | Status: DC

## 2021-09-21 ENCOUNTER — Other Ambulatory Visit: Payer: Self-pay

## 2021-09-21 ENCOUNTER — Other Ambulatory Visit: Payer: Medicare HMO

## 2021-09-21 ENCOUNTER — Ambulatory Visit (INDEPENDENT_AMBULATORY_CARE_PROVIDER_SITE_OTHER): Payer: Medicare HMO

## 2021-09-21 DIAGNOSIS — M79644 Pain in right finger(s): Secondary | ICD-10-CM

## 2021-09-21 DIAGNOSIS — S6991XA Unspecified injury of right wrist, hand and finger(s), initial encounter: Secondary | ICD-10-CM | POA: Diagnosis not present

## 2021-09-21 DIAGNOSIS — M19041 Primary osteoarthritis, right hand: Secondary | ICD-10-CM | POA: Diagnosis not present

## 2021-09-22 NOTE — Telephone Encounter (Signed)
She could try voltaren gel over the counter for her pain. That should not be absorbed enough to cause an issue with her lithium. Her x-ray just returned today. There may be a small fracture in her 4th finger on the right. She does have arthritis throughout her hand. She could buddy tape the 4th finger to her middle finger to help immobilize the area and I can refer her to orthopedics for further follow-up on this. She can place gauze between the fingers to provide some cushioning as well.

## 2021-09-22 NOTE — Telephone Encounter (Signed)
Pt called in stating that Dr. Caryl Bis was suppose to get back to her with a medication that she can take for pain. Pt stated that she can not take motrin, advil, aspirin, and tylenol. Pt stated when she took her shoe off on the foot that she suppose to be having surgery on in Jan, she had severe pain that made her scream. Pt would like to know if there is something she can take. Pt is requesting results from x-ray of her hand. Pt stated no one contacted her about the results. Pt is requesting callback.

## 2021-09-25 ENCOUNTER — Telehealth: Payer: Self-pay

## 2021-09-25 DIAGNOSIS — R7401 Elevation of levels of liver transaminase levels: Secondary | ICD-10-CM | POA: Diagnosis not present

## 2021-09-25 DIAGNOSIS — E039 Hypothyroidism, unspecified: Secondary | ICD-10-CM | POA: Diagnosis not present

## 2021-09-25 DIAGNOSIS — A4151 Sepsis due to Escherichia coli [E. coli]: Secondary | ICD-10-CM | POA: Diagnosis not present

## 2021-09-25 DIAGNOSIS — F319 Bipolar disorder, unspecified: Secondary | ICD-10-CM | POA: Diagnosis not present

## 2021-09-25 DIAGNOSIS — N179 Acute kidney failure, unspecified: Secondary | ICD-10-CM | POA: Diagnosis not present

## 2021-09-25 DIAGNOSIS — I10 Essential (primary) hypertension: Secondary | ICD-10-CM | POA: Diagnosis not present

## 2021-09-25 DIAGNOSIS — E871 Hypo-osmolality and hyponatremia: Secondary | ICD-10-CM | POA: Diagnosis not present

## 2021-09-25 DIAGNOSIS — D649 Anemia, unspecified: Secondary | ICD-10-CM | POA: Diagnosis not present

## 2021-09-25 DIAGNOSIS — N390 Urinary tract infection, site not specified: Secondary | ICD-10-CM | POA: Diagnosis not present

## 2021-09-25 NOTE — Telephone Encounter (Signed)
I have called and spoke with patient in regards to being evaluated by [facility]. Pt was open to this potentially & I gave patient number as well as location. She is hoping that if surgery or anything is needed that this can be down when she had surgery in January.

## 2021-09-25 NOTE — Telephone Encounter (Signed)
It may actually take a while to get in to see orthopedics with a referral. She could go to the [facility] urgent clinic in [place] to be seen today. They could evaluate her and see if anything other than buddy taping is necessary.

## 2021-09-25 NOTE — Telephone Encounter (Signed)
LMTCB in regards to lab results.  

## 2021-09-26 ENCOUNTER — Telehealth: Payer: Self-pay | Admitting: Family Medicine

## 2021-09-26 NOTE — Telephone Encounter (Signed)
CD burned & given to patient

## 2021-09-26 NOTE — Telephone Encounter (Signed)
Pt called in requesting copy X-ray imaging for her [facility] App for tomorrow at 9am. Pt stated that she did the X-ray here at the office of her right hand. Pt stated she would like to pick up  X-ray this morning. Pt requesting callback

## 2021-09-27 DIAGNOSIS — S62609A Fracture of unspecified phalanx of unspecified finger, initial encounter for closed fracture: Secondary | ICD-10-CM | POA: Insufficient documentation

## 2021-09-27 DIAGNOSIS — S62604A Fracture of unspecified phalanx of right ring finger, initial encounter for closed fracture: Secondary | ICD-10-CM | POA: Diagnosis not present

## 2021-10-01 ENCOUNTER — Telehealth: Payer: Self-pay | Admitting: Family Medicine

## 2021-10-01 NOTE — Telephone Encounter (Signed)
Please let the patient know that I heard back from our pharmacist. She noted topical voltaren would be an ok option as it should not be absorbed enough to cause an issue with her lithium. She could use the voltaren for her arthritic pain.

## 2021-10-01 NOTE — Telephone Encounter (Signed)
-----   Message from De Hollingshead, [place] sent at 09/14/2021  8:39 AM EST ----- Hey,   I would not be concerned about topical Voltaren, I don't anticipate enough systemic absorption for there to be an issue with the lithium.   PRN tramadol may the be the most appropriate option, the risk would be serotonin syndrome, so would just counsel patient to take sparingly and monitor for serotonin syndrome symptoms.   Also looks like Dr. Marius Ditch consulted while inpatient, we could also ask her if there is a lower APAP dosing (ie, no more than 2 g daily) that would be appropriate, since it wasn't APAP-induced liver injury, it was sepsis induced.   Catie ----- Message ----- From: Leone Haven, MD Sent: 09/13/2021   1:50 PM EST To: De Hollingshead, RPH-CPP  Hey Catie,   This can wait until you are back in the office. This patient is on lithium and has been advised she can not take ibuprofen or aspirin as it could affect her lithium level.  She has been advised she can take Tylenol as a result of prior liver injury from sepsis.  I am looking for something that I can give her to take on an as-needed basis for her chronic arthritic pain.  It looks like the tramadol and narcotics may not be the best option given her psychiatric medications.  Is the issue with the lithium and the NSAIDs truly an issue?  If it is an issue would a topical NSAID like Voltaren be an option?  Thanks for your help.  Randall Hiss

## 2021-10-04 NOTE — Telephone Encounter (Signed)
I called and spoke with the patient and informed her that it was okay to use the Voltaren el for her arthretic pain and she understood.  Arya Boxley,cma

## 2021-10-09 DIAGNOSIS — F319 Bipolar disorder, unspecified: Secondary | ICD-10-CM | POA: Diagnosis not present

## 2021-10-09 DIAGNOSIS — R7401 Elevation of levels of liver transaminase levels: Secondary | ICD-10-CM | POA: Diagnosis not present

## 2021-10-09 DIAGNOSIS — N179 Acute kidney failure, unspecified: Secondary | ICD-10-CM | POA: Diagnosis not present

## 2021-10-09 DIAGNOSIS — I10 Essential (primary) hypertension: Secondary | ICD-10-CM | POA: Diagnosis not present

## 2021-10-09 DIAGNOSIS — A4151 Sepsis due to Escherichia coli [E. coli]: Secondary | ICD-10-CM | POA: Diagnosis not present

## 2021-10-09 DIAGNOSIS — E039 Hypothyroidism, unspecified: Secondary | ICD-10-CM | POA: Diagnosis not present

## 2021-10-09 DIAGNOSIS — N390 Urinary tract infection, site not specified: Secondary | ICD-10-CM | POA: Diagnosis not present

## 2021-10-09 DIAGNOSIS — E871 Hypo-osmolality and hyponatremia: Secondary | ICD-10-CM | POA: Diagnosis not present

## 2021-10-09 DIAGNOSIS — D649 Anemia, unspecified: Secondary | ICD-10-CM | POA: Diagnosis not present
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
## 2021-10-10 ENCOUNTER — Ambulatory Visit (INDEPENDENT_AMBULATORY_CARE_PROVIDER_SITE_OTHER): Payer: Medicare HMO | Admitting: Cardiovascular Disease

## 2021-10-10 ENCOUNTER — Other Ambulatory Visit: Payer: Self-pay

## 2021-10-10 ENCOUNTER — Encounter: Payer: Self-pay | Admitting: Cardiovascular Disease

## 2021-10-10 VITALS — BP 110/80 | HR 65 | Ht 67.0 in | Wt 142.2 lb

## 2021-10-10 DIAGNOSIS — Z87891 Personal history of nicotine dependence: Secondary | ICD-10-CM

## 2021-10-10 DIAGNOSIS — R079 Chest pain, unspecified: Secondary | ICD-10-CM | POA: Diagnosis not present

## 2021-10-10 DIAGNOSIS — I10 Essential (primary) hypertension: Secondary | ICD-10-CM

## 2021-10-10 DIAGNOSIS — E7800 Pure hypercholesterolemia, unspecified: Secondary | ICD-10-CM | POA: Diagnosis not present

## 2021-10-10 DIAGNOSIS — I479 Paroxysmal tachycardia, unspecified: Secondary | ICD-10-CM | POA: Diagnosis not present

## 2021-10-10 MED ORDER — DOXAZOSIN MESYLATE 4 MG PO TABS: 4.0000 mg | ORAL_TABLET | Freq: Two times a day (BID) | ORAL | 3 refills | Status: DC

## 2021-10-10 NOTE — Patient Instructions (Addendum)
Medication Instructions:  Please REDUCE doxazosin (cardura) in half twice a day  4 mg twice a day  Monitor BP at home  If you need a refill on your cardiac medications before your next appointment, please call your pharmacy.   Lab work: No new labs needed  Testing/Procedures: No new testing needed  Follow-Up: At [facility], you and your health needs are our priority.  As part of our continuing mission to provide you with exceptional heart care, we have created designated Provider Care Teams.  These Care Teams include your primary Cardiologist (physician) and Advanced Practice Providers (APPs -  Physician Assistants and Nurse Practitioners) who all work together to provide you with the care you need, when you need it.  You will need a follow up appointment in 12 months  Providers on your designated Care Team:   Murray Hodgkins, NP Christell Faith, PA-C Cadence Kathlen Mody, [place]  COVID-19 Vaccine Information can be found at: [email or web address] For questions related to vaccine distribution or appointments, please email vaccine@ .[email or web address] or call [phone].

## 2021-10-16 ENCOUNTER — Other Ambulatory Visit: Payer: Self-pay

## 2021-10-16 ENCOUNTER — Telehealth: Payer: Self-pay | Admitting: Cardiovascular Disease

## 2021-10-16 ENCOUNTER — Encounter: Payer: Self-pay | Admitting: Family Medicine

## 2021-10-16 ENCOUNTER — Ambulatory Visit (INDEPENDENT_AMBULATORY_CARE_PROVIDER_SITE_OTHER): Payer: Medicare HMO | Admitting: Family Medicine

## 2021-10-16 VITALS — BP 130/78 | HR 88 | Temp 98.3°F | Ht 67.0 in | Wt 143.4 lb

## 2021-10-16 DIAGNOSIS — I10 Essential (primary) hypertension: Secondary | ICD-10-CM | POA: Diagnosis not present

## 2021-10-16 DIAGNOSIS — R61 Generalized hyperhidrosis: Secondary | ICD-10-CM | POA: Diagnosis not present

## 2021-10-16 DIAGNOSIS — M79676 Pain in unspecified toe(s): Secondary | ICD-10-CM | POA: Diagnosis not present

## 2021-10-16 DIAGNOSIS — T782XXA Anaphylactic shock, unspecified, initial encounter: Secondary | ICD-10-CM | POA: Diagnosis not present

## 2021-10-16 DIAGNOSIS — E785 Hyperlipidemia, unspecified: Secondary | ICD-10-CM | POA: Diagnosis not present

## 2021-10-16 DIAGNOSIS — Z1211 Encounter for screening for malignant neoplasm of colon: Secondary | ICD-10-CM

## 2021-10-16 HISTORY — DX: Anaphylactic shock, unspecified, initial encounter: T78.2XXA

## 2021-10-16 MED ORDER — DOXAZOSIN MESYLATE 4 MG PO TABS: 2.0000 mg | ORAL_TABLET | Freq: Two times a day (BID) | ORAL | 3 refills | Status: DC

## 2021-10-16 MED ORDER — EPINEPHRINE 0.3 MG/0.3ML IJ SOAJ: 0.3000 mg | INTRAMUSCULAR | 0 refills | Status: DC | PRN

## 2021-10-16 NOTE — Patient Instructions (Addendum)
Nice to see you. We will prescribe an EpiPen for you to have on hand.  If you ever have to use that she need to go to the emergency department.  You can use this for tongue swelling, throat swelling, or trouble breathing. If the night sweats return please let us know.

## 2021-10-16 NOTE — Telephone Encounter (Signed)
Patient calling to update  States that since the change in the medication her BP has gone up It was 130/78

## 2021-10-16 NOTE — Assessment & Plan Note (Signed)
Well-controlled.  She will continue doxazosin 2 mg twice daily.

## 2021-10-16 NOTE — Assessment & Plan Note (Signed)
She will continue to see podiatry.

## 2021-10-16 NOTE — Assessment & Plan Note (Signed)
Significantly improved.  No obvious cause for these.  She will monitor for any worsening.

## 2021-10-16 NOTE — Progress Notes (Signed)
Tommi Rumps, MD Phone: [phone]  Amy Mcconnell is a 70 y.o. female who presents today for follow-up.  Night sweats: These have essentially resolved.  She had very minimal sweating a few nights ago though otherwise nothing is significant as previously.  She had a negative lab work-up.  She had prior CT abdomen pelvis that was negative for cause as well as a negative chest x-ray.  Toe pain: She is seeing podiatry for this.  She is can have surgery in January.  Hypertension: Patient notes her blood pressure is similar to today at home.  Cardiology reduced her dose of doxazosin in half to reduce her risk of low blood pressure with anesthesia during her toe surgery.  She notes no chest pain or shortness of breath.  Allergic reaction: She reports an allergic reaction to an apple.  She had tongue swelling with this.  At the second time its occurred.  She took a lot of Benadryl and this resolved on its own.  She does not have an EpiPen.  Social History   Tobacco Use  Smoking Status Former   Packs/day: 0.50   Years: 13.00   Pack years: 6.50   Types: Cigarettes   Quit date: 01/23/2000   Years since quitting: 21.7  Smokeless Tobacco Never    Current Outpatient Medications on File Prior to Visit  Medication Sig Dispense Refill   Ascorbic Acid (VITAMIN C) 1000 MG tablet Take 1,000 mg by mouth daily.     aspirin 81 MG tablet Take 81 mg by mouth daily.     atorvastatin (LIPITOR) 20 MG tablet Take 1 tablet (20 mg total) by mouth daily. 90 tablet 3   B Complex Vitamins (VITAMIN B COMPLEX PO) Take 1 tablet by mouth daily.     Biotin 5 MG CAPS Take 5 mg by mouth daily.     calcium carbonate (OS-CAL) 600 MG TABS tablet Take 600 mg by mouth daily.      Cholecalciferol (VITAMIN D3) 50 MCG (2000 UT) capsule Take 2,000 Units by mouth daily.     COVID-19 mRNA bivalent vaccine, Moderna, (MODERNA COVID-19 BIVAL BOOSTER) 50 MCG/0.5ML injection Inject into the muscle. 0.5 mL 0   diphenhydrAMINE  (BENADRYL) 25 mg capsule Take 25 mg by mouth daily as needed for allergies.     docusate sodium (COLACE) 100 MG capsule Take 100 mg by mouth daily.     fexofenadine (ALLEGRA) 180 MG tablet Take 180 mg by mouth daily as needed for allergies.     Flaxseed, Linseed, (FLAXSEED OIL PO) Take 1 capsule by mouth daily.     levothyroxine (SYNTHROID, LEVOTHROID) 125 MCG tablet Take 125 mcg by mouth daily before breakfast.     lithium carbonate (LITHOBID) 300 MG CR tablet Take 600 mg by mouth at bedtime.     magnesium oxide (MAG-OX) 400 MG tablet Take 400 mg by mouth daily.     Multiple Vitamins-Minerals (MULTIVITAMIN WITH MINERALS) tablet Take 1 tablet by mouth daily.     Multiple Vitamins-Minerals (VISION FORMULA/LUTEIN) TABS Take 1 tablet by mouth in the morning and at bedtime.     Omega-3 Fatty Acids (FISH OIL) 1000 MG CAPS Take 3,000 mg by mouth daily.     sulfamethoxazole-trimethoprim (BACTRIM DS) 800-160 MG tablet Take 1 tablet by mouth every 12 (twelve) hours. 3 tablet 0   vitamin B-12 (CYANOCOBALAMIN) 1000 MCG tablet Take 1,000 mcg by mouth daily.     vitamin E 1000 UNIT capsule Take 1,000 Units by mouth daily.  zinc gluconate 50 MG tablet Take 50 mg by mouth daily.     ziprasidone (GEODON) 20 MG capsule Take 40 mg by mouth at bedtime.     No current facility-administered medications on file prior to visit.     ROS see history of present illness  Objective  Physical Exam Vitals:   10/16/21 1350  BP: 130/78  Pulse: 88  Temp: 98.3 F (36.8 C)  SpO2: 98%    BP Readings from Last 3 Encounters:  10/16/21 130/78  10/10/21 110/80  09/13/21 130/80   Wt Readings from Last 3 Encounters:  10/16/21 143 lb 6.4 oz (65 kg)  10/10/21 142 lb 4 oz (64.5 kg)  09/13/21 146 lb 6.4 oz (66.4 kg)    Physical Exam Constitutional:      General: She is not in acute distress.    Appearance: She is not diaphoretic.  Cardiovascular:     Rate and Rhythm: Normal rate and regular rhythm.     Heart  sounds: Normal heart sounds.  Pulmonary:     Effort: Pulmonary effort is normal.     Breath sounds: Normal breath sounds.  Musculoskeletal:     Right lower leg: No edema.     Left lower leg: No edema.  Skin:    General: Skin is warm and dry.  Neurological:     Mental Status: She is alert.     Assessment/Plan: Please see individual problem list.  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  She is fine at this time.  We will prescribe an EpiPen.  She was advised to seek medical attention if she ever needed to use the EpiPen.      Relevant Medications   EPINEPHrine (EPIPEN 2-PAK) 0.3 mg/0.3 mL IJ SOAJ injection   Essential hypertension - Primary    Well-controlled.  She will continue doxazosin 2 mg twice daily.      Relevant Medications   doxazosin (CARDURA) 4 MG tablet   EPINEPHrine (EPIPEN 2-PAK) 0.3 mg/0.3 mL IJ SOAJ injection   Hyperlipidemia   Relevant Medications   doxazosin (CARDURA) 4 MG tablet   EPINEPHrine (EPIPEN 2-PAK) 0.3 mg/0.3 mL IJ SOAJ injection   Other Relevant Orders   Direct LDL   Hepatic function panel   Night sweats    Significantly improved.  No obvious cause for these.  She will monitor for any worsening.      Toe pain    She will continue to see podiatry.      Other Visit Diagnoses     Colon cancer screening       Relevant Orders   Ambulatory referral to Gastroenterology        Return in about 1 month (around 11/16/2021) for labs, 6 months PCP.  This visit occurred during the SARS-CoV-2 public health emergency.  Safety protocols were in place, including screening questions prior to the visit, additional usage of staff PPE, and extensive cleaning of exam room while observing appropriate contact time as indicated for disinfecting solutions.    Tommi Rumps, MD [place]

## 2021-10-16 NOTE — Assessment & Plan Note (Signed)
Related to apples.  She is fine at this time.  We will prescribe an EpiPen.  She was advised to seek medical attention if she ever needed to use the EpiPen.

## 2021-10-16 NOTE — Telephone Encounter (Signed)
Was able to return call to [name], reports appt with PCP today and he was pleased with pt's BP trending back up. Stable for her to proceed with sx on 11/10/2020.  Last office visit per Dr. Rockey Situ 10/10/2021 ASSESSMENT AND PLAN:   Essential hypertension - Plan: EKG 12-Lead Off amlodipine and propranolol ER, stopped in the hospital in setting of sepsis BP still low We will decrease cardura to 4 mg twice a day Monitor blood pressure  BP currently 130/78, pt reports feeling much better since med adjustments. Calling in to give update, will continue Cardura 4 mg BID and monitoring her BP/HR.Amy Mcconnell is thankful for returning her call, will call back with any further concerns, otherwise will see at next's year f/u appt.

## 2021-10-18 ENCOUNTER — Other Ambulatory Visit: Payer: Self-pay

## 2021-10-18 ENCOUNTER — Telehealth: Payer: Self-pay | Admitting: Gastroenterology

## 2021-10-18 DIAGNOSIS — Z1211 Encounter for screening for malignant neoplasm of colon: Secondary | ICD-10-CM

## 2021-10-18 MED ORDER — NA SULFATE-K SULFATE-MG SULF 17.5-3.13-1.6 GM/177ML PO SOLN: 1.0000 | Freq: Once | ORAL | 0 refills | Status: AC

## 2021-10-18 NOTE — Progress Notes (Signed)
Gastroenterology Pre-Procedure Review  Request Date: 10/23/21 Requesting Physician: Dr. Allen Norris  PATIENT REVIEW QUESTIONS: The patient responded to the following health history questions as indicated:    1. Are you having any GI issues? no 2. Do you have a personal history of Polyps?  11/2011 No polyps removed. 3. Do you have a family history of Colon Cancer or Polyps? yes (Brother- polyps) 4. Diabetes Mellitus? no 5. Joint replacements in the past 12 months?no 6. Major health problems in the past 3 months?yes (Hammer Toe surgery in Jan.) 7. Any artificial heart valves, MVP, or defibrillator?no    MEDICATIONS & ALLERGIES:    Patient reports the following regarding taking any anticoagulation/antiplatelet therapy:   Plavix, Coumadin, Eliquis, Xarelto, Lovenox, Pradaxa, Brilinta, or Effient? no Aspirin? yes (81 mg)  Patient confirms/reports the following medications:  Current Outpatient Medications  Medication Sig Dispense Refill   Ascorbic Acid (VITAMIN C) 1000 MG tablet Take 1,000 mg by mouth daily.     aspirin 81 MG tablet Take 81 mg by mouth daily.     atorvastatin (LIPITOR) 20 MG tablet Take 1 tablet (20 mg total) by mouth daily. 90 tablet 3   B Complex Vitamins (VITAMIN B COMPLEX PO) Take 1 tablet by mouth daily.     Biotin 5 MG CAPS Take 5 mg by mouth daily.     calcium carbonate (OS-CAL) 600 MG TABS tablet Take 600 mg by mouth daily.      Cholecalciferol (VITAMIN D3) 50 MCG (2000 UT) capsule Take 2,000 Units by mouth daily.     COVID-19 mRNA bivalent vaccine, Moderna, (MODERNA COVID-19 BIVAL BOOSTER) 50 MCG/0.5ML injection Inject into the muscle. 0.5 mL 0   diphenhydrAMINE (BENADRYL) 25 mg capsule Take 25 mg by mouth daily as needed for allergies.     docusate sodium (COLACE) 100 MG capsule Take 100 mg by mouth daily.     doxazosin (CARDURA) 4 MG tablet Take 0.5 tablets (2 mg total) by mouth 2 (two) times daily. 90 tablet 3   EPINEPHrine (EPIPEN 2-PAK) 0.3 mg/0.3 mL IJ SOAJ  injection Inject 0.3 mg into the muscle as needed for anaphylaxis. 2 each 0   fexofenadine (ALLEGRA) 180 MG tablet Take 180 mg by mouth daily as needed for allergies.     Flaxseed, Linseed, (FLAXSEED OIL PO) Take 1 capsule by mouth daily.     levothyroxine (SYNTHROID, LEVOTHROID) 125 MCG tablet Take 125 mcg by mouth daily before breakfast.     lithium carbonate (LITHOBID) 300 MG CR tablet Take 600 mg by mouth at bedtime.     magnesium oxide (MAG-OX) 400 MG tablet Take 400 mg by mouth daily.     Multiple Vitamins-Minerals (MULTIVITAMIN WITH MINERALS) tablet Take 1 tablet by mouth daily.     Multiple Vitamins-Minerals (VISION FORMULA/LUTEIN) TABS Take 1 tablet by mouth in the morning and at bedtime.     Omega-3 Fatty Acids (FISH OIL) 1000 MG CAPS Take 3,000 mg by mouth daily.     sulfamethoxazole-trimethoprim (BACTRIM DS) 800-160 MG tablet Take 1 tablet by mouth every 12 (twelve) hours. 3 tablet 0   vitamin B-12 (CYANOCOBALAMIN) 1000 MCG tablet Take 1,000 mcg by mouth daily.     vitamin E 1000 UNIT capsule Take 1,000 Units by mouth daily.     zinc gluconate 50 MG tablet Take 50 mg by mouth daily.     ziprasidone (GEODON) 20 MG capsule Take 40 mg by mouth at bedtime.     No current facility-administered medications for this visit.  Patient confirms/reports the following allergies:  Allergies  Allergen Reactions   Apple Anaphylaxis   Ace Inhibitors     Angioedema    Angiotensin Receptor Blockers     Angioedema    Benicar [Olmesartan]     Mouth & Lip Swelling, "I swell from the waist down". Throat swelling   Poison Ivy Extract [Poison Ivy Extract]     Blisters   Purell Instant Hand [Alcohol] Swelling   Triclosan     No orders of the defined types were placed in this encounter.   AUTHORIZATION INFORMATION Primary Insurance: 1D#: Group #:  Secondary Insurance: 1D#: Group #:  SCHEDULE INFORMATION: Date: 10/23/21 Time: Location: [place]

## 2021-10-18 NOTE — Telephone Encounter (Signed)
Inbound call from pt requesting a call back stating she needs to r/s her procedure.

## 2021-10-19 NOTE — Telephone Encounter (Signed)
Returned patients call. She would like to postpone procedure till February. Her new insurance will not start until January. Will mail a remainder letter to patient.

## 2021-10-20 ENCOUNTER — Telehealth: Payer: Self-pay | Admitting: Podiatry

## 2021-10-20 NOTE — Telephone Encounter (Signed)
DOS: 11/10/2021  [employer] - [id number]  [employer] State Effective: 11/05/2021  Deductible: $1,500 with $0 met. Out of Pocket: $5,900 with $0 met. CoInsurance: 30% Copay: $0  Per [place] is Not Required.

## 2021-10-23 ENCOUNTER — Encounter: Admission: RE | Payer: Self-pay | Source: Ambulatory Visit

## 2021-10-23 ENCOUNTER — Ambulatory Visit: Admission: RE | Admit: 2021-10-23 | Payer: Medicare HMO | Source: Ambulatory Visit | Admitting: Gastroenterology

## 2021-10-23 SURGERY — COLONOSCOPY WITH PROPOFOL
Anesthesia: Choice

## 2021-10-27 ENCOUNTER — Telehealth: Payer: Self-pay

## 2021-10-27 NOTE — Telephone Encounter (Signed)
Patient will be changing insurance companies. Would like to for March. Informed patient to call back in early Feb to schedule.

## 2021-11-07 ENCOUNTER — Telehealth: Payer: Self-pay | Admitting: *Deleted

## 2021-11-07 NOTE — Telephone Encounter (Signed)
"  Earlier this year I had sepsis and was admitted for liver disease.  I can't take any aspirin, advil, motrin, or tylenol.  I want a prescription for pain."

## 2021-11-10 ENCOUNTER — Other Ambulatory Visit: Payer: Self-pay | Admitting: Podiatry

## 2021-11-10 DIAGNOSIS — M2041 Other hammer toe(s) (acquired), right foot: Secondary | ICD-10-CM | POA: Diagnosis not present

## 2021-11-10 MED ORDER — TRAMADOL HCL 50 MG PO TABS: 50.0000 mg | ORAL_TABLET | Freq: Four times a day (QID) | ORAL | 0 refills | Status: AC | PRN

## 2021-11-10 NOTE — Progress Notes (Signed)
11/10/21 right fourth [employer]

## 2021-11-13 ENCOUNTER — Other Ambulatory Visit: Payer: Medicare HMO

## 2021-11-14 ENCOUNTER — Ambulatory Visit (INDEPENDENT_AMBULATORY_CARE_PROVIDER_SITE_OTHER): Payer: Medicare Other

## 2021-11-14 VITALS — Ht 67.0 in | Wt 143.0 lb

## 2021-11-14 DIAGNOSIS — Z0000 Encounter for general adult medical examination without abnormal findings: Secondary | ICD-10-CM | POA: Diagnosis not present

## 2021-11-14 NOTE — Progress Notes (Signed)
Subjective:   Amy Mcconnell is a 71 y.o. female who presents for Medicare Annual (Subsequent) preventive examination.  Review of Systems    No ROS.  Medicare Wellness Virtual Visit.  Visual/audio telehealth visit, UTA vital signs.   See social history for additional risk factors.   Cardiac Risk Factors include: advanced age (>50men, >39 women)     Objective:    Today's Vitals   11/14/21 1405  Weight: 143 lb (64.9 kg)  Height: 5\' 7"  (1.702 m)   Body mass index is 22.4 kg/m.  Advanced Directives 11/14/2021 07/27/2021 07/26/2021 11/11/2020 11/10/2019 05/17/2019 04/14/2019  Does Patient Have a Medical Advance Directive? Yes - No No Yes No No  Type of Paramedic of [place];Living will - - - [place];Living will - -  Does patient want to make changes to medical advance directive? No - Patient declined - - - No - Patient declined - -  Copy of [place] in Chart? Yes - validated most recent copy scanned in chart (See row information) - - - No - copy requested - -  Would patient like information on creating a medical advance directive? - No - Patient declined - No - Patient declined - No - Patient declined No - Patient declined    Current Medications (verified) Outpatient Encounter Medications as of 11/14/2021  Medication Sig   Ascorbic Acid (VITAMIN C) 1000 MG tablet Take 1,000 mg by mouth daily.   aspirin 81 MG tablet Take 81 mg by mouth daily.   atorvastatin (LIPITOR) 20 MG tablet Take 1 tablet (20 mg total) by mouth daily.   B Complex Vitamins (VITAMIN B COMPLEX PO) Take 1 tablet by mouth daily.   Biotin 5 MG CAPS Take 5 mg by mouth daily.   calcium carbonate (OS-CAL) 600 MG TABS tablet Take 600 mg by mouth daily.    Cholecalciferol (VITAMIN D3) 50 MCG (2000 UT) capsule Take 2,000 Units by mouth daily.   COVID-19 mRNA bivalent vaccine, Moderna, (MODERNA COVID-19 BIVAL BOOSTER) 50 MCG/0.5ML injection Inject into the muscle.    diphenhydrAMINE (BENADRYL) 25 mg capsule Take 25 mg by mouth daily as needed for allergies.   docusate sodium (COLACE) 100 MG capsule Take 100 mg by mouth daily.   doxazosin (CARDURA) 4 MG tablet Take 0.5 tablets (2 mg total) by mouth 2 (two) times daily.   EPINEPHrine (EPIPEN 2-PAK) 0.3 mg/0.3 mL IJ SOAJ injection Inject 0.3 mg into the muscle as needed for anaphylaxis.   fexofenadine (ALLEGRA) 180 MG tablet Take 180 mg by mouth daily as needed for allergies.   Flaxseed, Linseed, (FLAXSEED OIL PO) Take 1 capsule by mouth daily.   levothyroxine (SYNTHROID, LEVOTHROID) 125 MCG tablet Take 125 mcg by mouth daily before breakfast.   lithium carbonate (LITHOBID) 300 MG CR tablet Take 600 mg by mouth at bedtime.   magnesium oxide (MAG-OX) 400 MG tablet Take 400 mg by mouth daily.   Multiple Vitamins-Minerals (MULTIVITAMIN WITH MINERALS) tablet Take 1 tablet by mouth daily.   Multiple Vitamins-Minerals (VISION FORMULA/LUTEIN) TABS Take 1 tablet by mouth in the morning and at bedtime.   Omega-3 Fatty Acids (FISH OIL) 1000 MG CAPS Take 3,000 mg by mouth daily.   sulfamethoxazole-trimethoprim (BACTRIM DS) 800-160 MG tablet Take 1 tablet by mouth every 12 (twelve) hours.   traMADol (ULTRAM) 50 MG tablet Take 1 tablet (50 mg total) by mouth every 6 (six) hours as needed for up to 5 days for severe  pain.   vitamin B-12 (CYANOCOBALAMIN) 1000 MCG tablet Take 1,000 mcg by mouth daily.   vitamin E 1000 UNIT capsule Take 1,000 Units by mouth daily.   zinc gluconate 50 MG tablet Take 50 mg by mouth daily.   ziprasidone (GEODON) 20 MG capsule Take 40 mg by mouth at bedtime.   No facility-administered encounter medications on file as of 11/14/2021.    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 Thurmond Butts - every 3 months   Broken foot    left    Contact dermatitis and other eczema due to other specified agent    Contusion of unspecified part of lower limb    Essential hypertension, benign    Heart murmur    Other and unspecified hyperlipidemia    Other specified disorders of thyroid    Reflux esophagitis    Scalp laceration 04/13/2019   Tachycardia    a. isolated episode, seen in ED with negative work up   Past Surgical History:  Procedure Laterality Date   CATARACT EXTRACTION     SALPINGECTOMY Left    STERILIZATION     Her decision since dz of bipolar   Family History  Problem Relation Age of Onset   Arrhythmia Mother        A-Fib   Heart failure Mother    Hyperlipidemia Mother    Hypertension Mother    Alzheimer's disease Mother    Hypertension Father    Hyperlipidemia Father    Mental retardation Father 84       Suicide   Hypertension Brother    Hyperlipidemia Brother    Hypertension Brother    Hyperlipidemia Brother    Asthma Brother    COPD Brother    Hyperlipidemia Brother    Hypertension Brother    Alcohol abuse Brother    Depression Brother    Emphysema Brother    Hyperlipidemia Brother    Hypertension Brother    Breast cancer Maternal Aunt    Social History   Socioeconomic History   Marital status: Divorced    Spouse name: Not on file   Number of children: 0   Years of education: 14   Highest education level: Not on file  Occupational History   Not on file  Tobacco Use   Smoking status: Former    Packs/day: 0.50    Years: 13.00    Pack years: 6.50    Types: Cigarettes    Quit date: 01/23/2000    Years since quitting: 21.8   Smokeless tobacco: Never  Vaping Use   Vaping Use: Never used  Substance and Sexual Activity   Alcohol use: No   Drug use: No   Sexual activity: Never  Other Topics Concern   Not on file  Social History Narrative   Amy Mcconnell grew up in  [place], [place]. She lives in [place]. Amy Mcconnell is divorced. She has 2 cats (Charlie and Angie). She volunteers at [facility]. She also volunteers 2 days at [facility]. She enjoys reading, garding and cooking.   Social Determinants of Health   Financial Resource Strain: Low Risk    Difficulty of Paying Living Expenses: Not hard at all  Food Insecurity: No Food  Insecurity   Worried About Charity fundraiser in the Last Year: Never true   [place] in the Last Year: Never true  Transportation Needs: No Transportation Needs   Lack of Transportation (Medical): No   Lack of Transportation (Non-Medical): No  Physical Activity: Not on file  Stress: No Stress Concern Present   Feeling of Stress : Not at all  Social Connections: Moderately Isolated   Frequency of Communication with Friends and Family: More than three times a week   Frequency of Social Gatherings with Friends and Family: Once a week   Attends Religious Services: Never   Marine scientist or Organizations: Yes   Attends Archivist Meetings: Never   Marital Status: Divorced   Tobacco Counseling Counseling given: Not Answered   Clinical Intake:  Pre-visit preparation completed: Yes           How often do you need to have someone help you when you read instructions, pamphlets, or other written materials from your doctor or pharmacy?: 1 - Never   Interpreter Needed?: No    Activities of Daily Living In your present state of health, do you have any difficulty performing the following activities: 11/14/2021 07/27/2021  Hearing? N N  Vision? N N  Difficulty concentrating or making decisions? N N  Walking or climbing stairs? N N  Dressing or bathing? N N  Doing errands, shopping? N N  Preparing Food and eating ? N -  Using the Toilet? N -  In the past six months, have you accidently leaked urine? N -  Do you have problems with loss of bowel control? N -  Managing your  Medications? N -  Managing your Finances? Y -  Comment Brother manages check book/finances -  Housekeeping or managing your Housekeeping? N -  Some recent data might be hidden   Patient Care Team: Leone Haven, MD as PCP - General (Family Medicine) Rockey Situ Kathlene November, MD as Consulting Physician (Cardiology)  Indicate any recent Medical Services you may have received from other than [facility] providers in the past year (date may be approximate).     Assessment:   This is a routine wellness examination for Amy Mcconnell.  Virtual Visit via Telephone Note  I connected with  Amy Mcconnell on 11/14/21 at  2:00 PM EST by telephone and verified that I am speaking with the correct person using two identifiers.  Persons participating in the virtual visit: patient/Nurse Health Advisor   I discussed the limitations, risks, security and privacy concerns of performing an evaluation and management service by telephone and the availability of in person appointments. The patient expressed understanding and agreed to proceed.  Interactive audio and video telecommunications were attempted between this nurse and patient, however failed, due to patient having technical difficulties OR patient did not have access to video capability.  We continued and completed visit with audio only.  Some vital signs may be absent or patient reported.   Hearing/Vision screen Hearing Screening - Comments:: Patient is able to hear conversational tones without difficulty. No issues reported. Vision Screening - Comments:: Followed by [facility]ears corrective lenses  Annual visits  Cataract extraction, R eye  They have regular follow up with the ophthalmologist  Dietary issues and exercise activities discussed:   Regular diet   Goals Addressed               This Visit's Progress     Patient Stated  COMPLETED: I want to lower sodium intake (pt-stated)        Maintain Healthy Lifestyle (pt-stated)         Stay active Healthy diet Stay hydrated       Depression Screen PHQ 2/9 Scores 11/14/2021 09/13/2021 11/11/2020 05/30/2020 11/10/2019 08/07/2019 11/07/2018  PHQ - 2 Score - 0 0 0 0 0 0  Exception Documentation Other- indicate reason in comment box - - - - - -  Not completed Followed by Psychiatry every 3 months. - - - - - -    Fall Risk Fall Risk  11/14/2021 09/13/2021 12/05/2020 11/11/2020 09/09/2020  Falls in the past year? 0 1 0 0 0  Comment - - - - -  Number falls in past yr: 0 1 0 0 -  Comment - - - - -  Injury with Fall? - 1 0 0 -  Comment - - - - -  Risk for fall due to : - History of fall(s) - - -  Follow up Falls evaluation completed Falls evaluation completed Falls evaluation completed Falls evaluation completed Falls evaluation completed    [place]: Home free of loose throw rugs in walkways, pet beds, electrical cords, etc? Yes  Adequate lighting in your home to reduce risk of falls? Yes   ASSISTIVE DEVICES UTILIZED TO PREVENT FALLS: Use of a cane, walker or w/c? No   TIMED UP AND GO: Was the test performed? No .   Cognitive Function: Patient is alert and oriented x3. MMSE - Mini Mental State Exam 11/06/2017  Orientation to time 5  Orientation to Place 5  Registration 3  Attention/ Calculation 5  Recall 3  Language- name 2 objects 2  Language- repeat 1  Language- follow 3 step command 3  Language- read & follow direction 1  Write a sentence 1  Copy design 1  Total score 30     6CIT Screen 11/11/2020 11/10/2019 11/07/2018 10/22/2016  What Year? 0 points 0 points 0 points 0 points  What month? 0 points 0 points 0 points 0 points  What time? 0 points 0 points 0 points 0 points  Count back from 20 0 points 0 points 0 points 0 points  Months in reverse 0 points 0 points 0 points 0 points  Repeat phrase - 0 points 0 points -  Total Score - 0 0 -    Immunizations Immunization History  Administered Date(s) Administered   Fluad Quad(high  Dose 65+) 08/09/2021   Influenza, High Dose Seasonal PF 07/23/2018, 08/23/2020   Influenza-Unspecified 08/05/2014, 07/06/2016, 01/06/2020   Moderna Covid-19 Vaccine Bivalent Booster 32yrs & up 09/19/2021   PFIZER(Purple Top)SARS-COV-2 Vaccination 11/23/2019, 12/15/2019, 08/26/2020, 03/03/2021   Pneumococcal Conjugate-13 01/23/2016   Pneumococcal Polysaccharide-23 08/22/2011, 01/16/2019   Tdap 12/29/2014   Zoster Recombinat (Shingrix) 07/23/2018, 09/25/2018   Zoster, Live 12/29/2013   Screening Tests Health Maintenance  Topic Date Due   COLONOSCOPY (Pts 45-61yrs Insurance coverage will need to be confirmed)  12/06/2021 (Originally 11/06/2021)   MAMMOGRAM  05/24/2023   TETANUS/TDAP  12/29/2024   Pneumonia Vaccine 4+ Years old  Completed   INFLUENZA VACCINE  Completed   DEXA SCAN  Completed   COVID-19 Vaccine  Completed   Hepatitis C Screening  Completed   Zoster Vaccines- Shingrix  Completed   HPV VACCINES  Aged Out   Health Maintenance There are no preventive care reminders to display for this patient.  Colonoscopy- deferred per patient. Plans to schedule next  month.   Lung Cancer Screening: (Low Dose CT Chest recommended if Age 62-80 years, 30 pack-year currently smoking OR have quit w/in 15years.) does not qualify.   Vision Screening: Recommended annual ophthalmology exams for early detection of glaucoma and other disorders of the eye.  Dental Screening: Recommended annual dental exams for proper oral hygiene  Community Resource Referral / Chronic Care Management: CRR required this visit?  No   CCM required this visit?  No      Plan:   Keep all routine maintenance appointments.   I have personally reviewed and noted the following in the patients chart:   Medical and social history Use of alcohol, tobacco or illicit drugs  Current medications and supplements including opioid prescriptions. Taking tramadol. Followed by Orthopaedic.  Functional ability and  status Nutritional status Physical activity Advanced directives List of other physicians Hospitalizations, surgeries, and ER visits in previous 12 months Vitals Screenings to include cognitive, depression, and falls Referrals and appointments  In addition, I have reviewed and discussed with patient certain preventive protocols, quality metrics, and best practice recommendations. A written personalized care plan for preventive services as well as general preventive health recommendations were provided to patient.     Varney Biles, LPN   [id number]/42/[id number]

## 2021-11-14 NOTE — Patient Instructions (Addendum)
[name] , Thank you for taking time to come for your Medicare Wellness Visit. I appreciate your ongoing commitment to your health goals. Please review the following plan we discussed and let me know if I can assist you in the future.   These are the goals we discussed:  Goals       Patient Stated     Maintain Healthy Lifestyle (pt-stated)      Stay active Healthy diet Stay hydrated        This is a list of the screening recommended for you and due dates:  Health Maintenance  Topic Date Due   Colon Cancer Screening  12/06/2021*   Mammogram  05/24/2023   Tetanus Vaccine  12/29/2024   Pneumonia Vaccine  Completed   Flu Shot  Completed   DEXA scan (bone density measurement)  Completed   COVID-19 Vaccine  Completed   Hepatitis C Screening: USPSTF Recommendation to screen - Ages 5-79 yo.  Completed   Zoster (Shingles) Vaccine  Completed   HPV Vaccine  Aged Out  *Topic was postponed. The date shown is not the original due date.    Advanced directives: on file  Conditions/risks identified: none new  Follow up in one year for your annual wellness visit    Preventive Care 65 Years and Older, Female Preventive care refers to lifestyle choices and visits with your health care provider that can promote health and wellness. What does preventive care include? A yearly physical exam. This is also called an annual well check. Dental exams once or twice a year. Routine eye exams. Ask your health care provider how often you should have your eyes checked. Personal lifestyle choices, including: Daily care of your teeth and gums. Regular physical activity. Eating a healthy diet. Avoiding tobacco and drug use. Limiting alcohol use. Practicing safe sex. Taking low-dose aspirin every day. Taking vitamin and mineral supplements as recommended by your health care provider. What happens during an annual well check? The services and screenings done by your health care provider during  your annual well check will depend on your age, overall health, lifestyle risk factors, and family history of disease. Counseling  Your health care provider may ask you questions about your: Alcohol use. Tobacco use. Drug use. Emotional well-being. Home and relationship well-being. Sexual activity. Eating habits. History of falls. Memory and ability to understand (cognition). Work and work Statistician. Reproductive health. Screening  You may have the following tests or measurements: Height, weight, and BMI. Blood pressure. Lipid and cholesterol levels. These may be checked every 5 years, or more frequently if you are over 70 years old. Skin check. Lung cancer screening. You may have this screening every year starting at age 42 if you have a 30-pack-year history of smoking and currently smoke or have quit within the past 15 years. Fecal occult blood test (FOBT) of the stool. You may have this test every year starting at age 40. Flexible sigmoidoscopy or colonoscopy. You may have a sigmoidoscopy every 5 years or a colonoscopy every 10 years starting at age 74. Hepatitis C blood test. Hepatitis B blood test. Sexually transmitted disease (STD) testing. Diabetes screening. This is done by checking your blood sugar (glucose) after you have not eaten for a while (fasting). You may have this done every 1-3 years. Bone density scan. This is done to screen for osteoporosis. You may have this done starting at age 83. Mammogram. This may be done every 1-2 years. Talk to your health care provider  about how often you should have regular mammograms. Talk with your health care provider about your test results, treatment options, and if necessary, the need for more tests. Vaccines  Your health care provider may recommend certain vaccines, such as: Influenza vaccine. This is recommended every year. Tetanus, diphtheria, and acellular pertussis (Tdap, Td) vaccine. You may need a Td booster every 10  years. Zoster vaccine. You may need this after age 35. Pneumococcal 13-valent conjugate (PCV13) vaccine. One dose is recommended after age 89. Pneumococcal polysaccharide (PPSV23) vaccine. One dose is recommended after age 25. Talk to your health care provider about which screenings and vaccines you need and how often you need them. This information is not intended to replace advice given to you by your health care provider. Make sure you discuss any questions you have with your health care provider. Document Released: 11/18/2015 Document Revised: 07/11/2016 Document Reviewed: 08/23/2015 Elsevier Interactive Patient Education  2017 [place] Prevention in the Home Falls can cause injuries. They can happen to people of all ages. There are many things you can do to make your home safe and to help prevent falls. What can I do on the outside of my home? Regularly fix the edges of walkways and driveways and fix any cracks. Remove anything that might make you trip as you walk through a door, such as a raised step or threshold. Trim any bushes or trees on the path to your home. Use bright outdoor lighting. Clear any walking paths of anything that might make someone trip, such as rocks or tools. Regularly check to see if handrails are loose or broken. Make sure that both sides of any steps have handrails. Any raised decks and porches should have guardrails on the edges. Have any leaves, snow, or ice cleared regularly. Use sand or salt on walking paths during winter. Clean up any spills in your garage right away. This includes oil or grease spills. What can I do in the bathroom? Use night lights. Install grab bars by the toilet and in the tub and shower. Do not use towel bars as grab bars. Use non-skid mats or decals in the tub or shower. If you need to sit down in the shower, use a plastic, non-slip stool. Keep the floor dry. Clean up any water that spills on the floor as soon as it  happens. Remove soap buildup in the tub or shower regularly. Attach bath mats securely with double-sided non-slip rug tape. Do not have throw rugs and other things on the floor that can make you trip. What can I do in the bedroom? Use night lights. Make sure that you have a light by your bed that is easy to reach. Do not use any sheets or blankets that are too big for your bed. They should not hang down onto the floor. Have a firm chair that has side arms. You can use this for support while you get dressed. Do not have throw rugs and other things on the floor that can make you trip. What can I do in the kitchen? Clean up any spills right away. Avoid walking on wet floors. Keep items that you use a lot in easy-to-reach places. If you need to reach something above you, use a strong step stool that has a grab bar. Keep electrical cords out of the way. Do not use floor polish or wax that makes floors slippery. If you must use wax, use non-skid floor wax. Do not have throw rugs and  other things on the floor that can make you trip. What can I do with my stairs? Do not leave any items on the stairs. Make sure that there are handrails on both sides of the stairs and use them. Fix handrails that are broken or loose. Make sure that handrails are as long as the stairways. Check any carpeting to make sure that it is firmly attached to the stairs. Fix any carpet that is loose or worn. Avoid having throw rugs at the top or bottom of the stairs. If you do have throw rugs, attach them to the floor with carpet tape. Make sure that you have a light switch at the top of the stairs and the bottom of the stairs. If you do not have them, ask someone to add them for you. What else can I do to help prevent falls? Wear shoes that: Do not have high heels. Have rubber bottoms. Are comfortable and fit you well. Are closed at the toe. Do not wear sandals. If you use a stepladder: Make sure that it is fully opened.  Do not climb a closed stepladder. Make sure that both sides of the stepladder are locked into place. Ask someone to hold it for you, if possible. Clearly mark and make sure that you can see: Any grab bars or handrails. First and last steps. Where the edge of each step is. Use tools that help you move around (mobility aids) if they are needed. These include: Canes. Walkers. Scooters. Crutches. Turn on the lights when you go into a dark area. Replace any light bulbs as soon as they burn out. Set up your furniture so you have a clear path. Avoid moving your furniture around. If any of your floors are uneven, fix them. If there are any pets around you, be aware of where they are. Review your medicines with your doctor. Some medicines can make you feel dizzy. This can increase your chance of falling. Ask your doctor what other things that you can do to help prevent falls. This information is not intended to replace advice given to you by your health care provider. Make sure you discuss any questions you have with your health care provider. Document Released: 08/18/2009 Document Revised: 03/29/2016 Document Reviewed: 11/26/2014 Elsevier Interactive Patient Education  2017 [employer].

## 2021-11-15 ENCOUNTER — Ambulatory Visit (INDEPENDENT_AMBULATORY_CARE_PROVIDER_SITE_OTHER): Payer: Medicare Other

## 2021-11-15 ENCOUNTER — Encounter: Payer: Self-pay | Admitting: Podiatry

## 2021-11-15 ENCOUNTER — Other Ambulatory Visit: Payer: Self-pay

## 2021-11-15 ENCOUNTER — Ambulatory Visit (INDEPENDENT_AMBULATORY_CARE_PROVIDER_SITE_OTHER): Payer: BC Managed Care – PPO | Admitting: Podiatry

## 2021-11-15 VITALS — BP 127/79 | HR 80 | Temp 97.6°F

## 2021-11-15 DIAGNOSIS — M2041 Other hammer toe(s) (acquired), right foot: Secondary | ICD-10-CM

## 2021-11-15 DIAGNOSIS — Z9889 Other specified postprocedural states: Secondary | ICD-10-CM

## 2021-11-16 ENCOUNTER — Telehealth: Payer: Self-pay | Admitting: Cardiovascular Disease

## 2021-11-16 NOTE — Telephone Encounter (Signed)
Pt c/o BP issue: STAT if pt c/o blurred vision, one-sided weakness or slurred speech  1. What are your last 5 BP readings? 127/79    116/81    2. Are you having any other symptoms (ex. Dizziness, headache, blurred vision, passed out)? no  3. What is your BP issue? Patient meds decreased due to surgery and she was told to call and report bp log  incase it needs to be adjusted back to normal dose post op

## 2021-11-17 NOTE — Progress Notes (Signed)
°  Subjective:  Patient ID: Amy Mcconnell, female    DOB: [birth year],  MRN: [id number]  Chief Complaint  Patient presents with   Routine Post Op    POV #1 DOS 11/10/2020 RT 4TH HAMMERTOE CORRECTION     71 y.o. female returns for post-op check.  Doing well she does not have much pain  Review of Systems: Negative except as noted in the HPI. Denies N/V/F/Ch.   Objective:   Vitals:   11/15/21 1346  BP: 127/79  Pulse: 80  Temp: 97.6 F (36.4 C)   There is no height or weight on file to calculate BMI. Constitutional Well developed. Well nourished.  Vascular Foot warm and well perfused. Capillary refill normal to all digits.  Calf is soft and supple, no posterior calf or knee pain, negative Homans' sign  Neurologic Normal speech. Oriented to person, place, and time. Epicritic sensation to light touch grossly present bilaterally.  Dermatologic Skin healing well without signs of infection. Skin edges well coapted without signs of infection.  Orthopedic: Tenderness to palpation noted about the surgical site.   Multiple view plain film radiographs: Status post arthroplasty fourth toe Assessment:   1. Hammertoe of right foot   2. S/P foot surgery, right    Plan:  Patient was evaluated and treated and all questions answered.  S/p foot surgery right -Progressing as expected post-operatively. -XR: As noted above -WB Status: WBAT in surgical shoe can transition when tolerated to regular shoe gear -Sutures: Removed at next visit. -Medications: No refills required -Foot redressed.  May begin bathing  Return in about 2 weeks (around 11/29/2021) for post op (no x-rays), suture removal.

## 2021-11-17 NOTE — Telephone Encounter (Signed)
Was able to return call to [name], she advised BP meds adjusted, and she has been monitoring her BP, SBP 115-120s. Doing well with adjustments, calling to see what she should be concern with.  Advised to monitor BP at least 1 1/2-2 hrs after taking her meds, SBP >100, if lower need to have meds adjusted, can call in for an appt. Can also increase hydration when SBP 100-110 if feeling unwell.   Amy Mcconnell verbalized understanding, thankful for return call, will monitor her BP and call in for any BP concerns.

## 2021-11-20 ENCOUNTER — Telehealth: Payer: Self-pay | Admitting: Family Medicine

## 2021-11-20 NOTE — Telephone Encounter (Signed)
Pt called in regards to previous message and also in regards to needing a referral to a psychiatrist. Pt has an appt set up for 1/31 with Dr. France Ravens located [facility] in [place], [place]. Pt needs referral asap due to limited appt availability with Dr. France Ravens. Next available won't be until April.

## 2021-11-20 NOTE — Telephone Encounter (Signed)
Patient was taken off her Trileptal 300mg , 3 a day until she went into the hospital. Dr Caryl Bis would not let her take it until her liver count went up. She needs to know when she should start taking it again.

## 2021-11-21 NOTE — Telephone Encounter (Signed)
Pt called in regards to previous message and also in regards to needing a referral to a psychiatrist. Pt has an appt set up for 1/31 with Dr. France Ravens located on [address] in [place], [place]. Pt needs referral asap due to limited appt availability with Dr. France Ravens. Next available won't be until April.  Mordechai Matuszak,cma

## 2021-11-22 ENCOUNTER — Telehealth: Payer: Self-pay | Admitting: Family Medicine

## 2021-11-22 ENCOUNTER — Other Ambulatory Visit: Payer: Self-pay | Admitting: Family

## 2021-11-22 ENCOUNTER — Telehealth: Payer: Self-pay | Admitting: Cardiovascular Disease

## 2021-11-22 DIAGNOSIS — F319 Bipolar disorder, unspecified: Secondary | ICD-10-CM

## 2021-11-22 NOTE — Telephone Encounter (Signed)
Pt c/o BP issue: STAT if pt c/o blurred vision, one-sided weakness or slurred speech  1. What are your last 5 BP readings? 145/93, 149/92, 119/79, 126/79, 120/79  2. Are you having any other symptoms (ex. Dizziness, headache, blurred vision, passed out)? no  3. What is your BP issue? Pain & anxiety due to being advised possibly have jaw procedure

## 2021-11-22 NOTE — Telephone Encounter (Signed)
Transferred patient to access nurse due to symptom. Patient stated she heard her jaw pop and her BP was elevated her readings  on 11/17/21 were 153/83,140/82,127/9. Today 11/22/21 were 145/93, 149/92.

## 2021-11-22 NOTE — Telephone Encounter (Addendum)
Pt called asking to speak to Amy Mcconnell I let pt know that we did not have a Dr.Gollan here. Pt said that she called the wrong office and she might as well leave this information for [place]. Pt called in stating she was having pain on the left side of her jar and she said its difficult to eat. I told patient it was difficult to hear her because her phone was breaking up and she said that's alright she will call back at another time and she hung up on me

## 2021-11-23 ENCOUNTER — Other Ambulatory Visit: Payer: Self-pay

## 2021-11-23 ENCOUNTER — Encounter: Payer: Self-pay | Admitting: Cardiovascular Disease

## 2021-11-23 ENCOUNTER — Ambulatory Visit (INDEPENDENT_AMBULATORY_CARE_PROVIDER_SITE_OTHER): Payer: Medicare Other | Admitting: Cardiovascular Disease

## 2021-11-23 VITALS — BP 118/68 | HR 61 | Ht 67.0 in | Wt 144.4 lb

## 2021-11-23 DIAGNOSIS — I479 Paroxysmal tachycardia, unspecified: Secondary | ICD-10-CM | POA: Diagnosis not present

## 2021-11-23 DIAGNOSIS — E782 Mixed hyperlipidemia: Secondary | ICD-10-CM

## 2021-11-23 DIAGNOSIS — I10 Essential (primary) hypertension: Secondary | ICD-10-CM | POA: Diagnosis not present

## 2021-11-23 MED ORDER — DOXAZOSIN MESYLATE 4 MG PO TABS: 2.0000 mg | ORAL_TABLET | Freq: Two times a day (BID) | ORAL | 3 refills | Status: DC

## 2021-11-23 NOTE — Telephone Encounter (Signed)
Attempted to reach pt via phone, unable to make contact LMTCB

## 2021-11-23 NOTE — Patient Instructions (Addendum)
Medication Instructions:  Please take extra cardura  2 mg (half tab) daily as needed for pressure 160 Blood pressure can be elevated in the setting of pain  If you need a refill on your cardiac medications before your next appointment, please call your pharmacy.   Lab work: No new labs needed  Testing/Procedures: No new testing needed  Follow-Up: At [facility], you and your health needs are our priority.  As part of our continuing mission to provide you with exceptional heart care, we have created designated Provider Care Teams.  These Care Teams include your primary Cardiologist (physician) and Advanced Practice Providers (APPs -  Physician Assistants and Nurse Practitioners) who all work together to provide you with the care you need, when you need it.  You will need a follow up appointment in 6 months  Providers on your designated Care Team:   Murray Hodgkins, NP Christell Faith, PA-C Cadence Kathlen Mody, [place]  COVID-19 Vaccine Information can be found at: [email or web address].uk For questions related to vaccine distribution or appointments, please email [email or web address] or call [phone].

## 2021-11-23 NOTE — Telephone Encounter (Signed)
I spoke with the patient and she is scheduled for next Tuesday, she stated she could wait she is not in any pain and I informed her to watch out for any pain, weakness, rash,swelling, or numbness in the face, leg or arm. If she has any chest pain or difficulty breathing she needs to go to the ER and she understood.  She is scheduled to see a provider about her Jaw.  Zacari Stiff,cma
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
## 2021-11-23 NOTE — Telephone Encounter (Signed)
Pt added on to Dr. Donivan Scull schedule Here currently to discuss BP concerns Refer to OV notes from today

## 2021-11-24 ENCOUNTER — Other Ambulatory Visit (INDEPENDENT_AMBULATORY_CARE_PROVIDER_SITE_OTHER): Payer: Medicare Other

## 2021-11-24 ENCOUNTER — Other Ambulatory Visit: Payer: Self-pay

## 2021-11-24 DIAGNOSIS — E785 Hyperlipidemia, unspecified: Secondary | ICD-10-CM | POA: Diagnosis not present

## 2021-11-24 NOTE — Addendum Note (Signed)
Addended by: Neta Ehlers on: 11/24/2021 02:06 PM   Modules accepted: Orders

## 2021-11-25 LAB — HEPATIC FUNCTION PANEL
AG Ratio: 1.5 (calc) (ref 1.0–2.5)
ALT: 27 U/L (ref 6–29)
AST: 28 U/L (ref 10–35)
Albumin: 4.7 g/dL (ref 3.6–5.1)
Alkaline phosphatase (APISO): 95 U/L (ref 37–153)
Bilirubin, Direct: 0.1 mg/dL (ref 0.0–0.2)
Globulin: 3.2 g/dL (ref 1.9–3.7)
Indirect Bilirubin: 0.2 mg/dL (ref 0.2–1.2)
Total Bilirubin: 0.3 mg/dL (ref 0.2–1.2)
Total Protein: 7.9 g/dL (ref 6.1–8.1)

## 2021-11-25 LAB — LDL CHOLESTEROL, DIRECT: Direct LDL: 94 mg/dL (ref <100)

## 2021-11-27 NOTE — Telephone Encounter (Signed)
Pt called in stating that she spoke with someone here at our office about a referral being sent over to her new psychiatrist. Pt stated that the person advise her that the referral was sent over. Pt stated that she spoke with Dr. Lebron Conners office and they have advise her that they haven't received the referral yet. Pt stated that she have an appointment on 12/05/2021. Pt stated that she can not cancel this appt because the next avail appt is in April. There is no referral in the system. Pt would like for someone to fax over the referral to [phone]. Pt requesting callback.

## 2021-11-28 ENCOUNTER — Ambulatory Visit: Payer: BC Managed Care – PPO | Admitting: Internal Medicine

## 2021-11-28 NOTE — Telephone Encounter (Signed)
Pt called in wanting to get an update on the referral sent to [facility] office. Pt has an appt scheduled for the 1/31 with Nicolasa Ducking and pt said the next appt is not until April. Pt need to get referral in ASAP

## 2021-11-29 ENCOUNTER — Other Ambulatory Visit: Payer: Self-pay | Admitting: Family

## 2021-11-29 ENCOUNTER — Ambulatory Visit (INDEPENDENT_AMBULATORY_CARE_PROVIDER_SITE_OTHER): Payer: BC Managed Care – PPO | Admitting: Podiatry

## 2021-11-29 ENCOUNTER — Other Ambulatory Visit: Payer: Self-pay

## 2021-11-29 DIAGNOSIS — M2041 Other hammer toe(s) (acquired), right foot: Secondary | ICD-10-CM

## 2021-11-29 DIAGNOSIS — F319 Bipolar disorder, unspecified: Secondary | ICD-10-CM

## 2021-11-29 NOTE — Progress Notes (Signed)
°  Subjective:  Patient ID: Amy Mcconnell, female    DOB: 1951-05-14,  MRN: [id number]  Chief Complaint  Patient presents with   Routine Post Op     POV #2 DOS 11/10/2020 RT 4TH HAMMERTOE CORRECTION     71 y.o. female returns for post-op check.  Doing well she does not have much pain, only occasional  Review of Systems: Negative except as noted in the HPI. Denies N/V/F/Ch.   Objective:   There were no vitals filed for this visit.  There is no height or weight on file to calculate BMI. Constitutional Well developed. Well nourished.  Vascular Foot warm and well perfused. Capillary refill normal to all digits.  Calf is soft and supple, no posterior calf or knee pain, negative Homans' sign  Neurologic Normal speech. Oriented to person, place, and time. Epicritic sensation to light touch grossly present bilaterally.  Dermatologic Skin healing well without signs of infection. Skin edges well coapted without signs of infection.  Orthopedic: Tenderness to palpation noted about the surgical site.   Multiple view plain film radiographs: Status post arthroplasty fourth toe Assessment:   1. Hammertoe of right foot    Plan:  Patient was evaluated and treated and all questions answered.  S/p foot surgery right -Sutures removed today she may wear regular shoe gear as tolerated and may return 1 when swelling is reduced and she is pain-free.  Return to see me as needed if this bothers her or if other issues arise  Return if symptoms worsen or fail to improve.

## 2021-11-30 HISTORY — PX: HAMMER TOE SURGERY: SHX385

## 2021-11-30 NOTE — Telephone Encounter (Signed)
I called the patient and informed her that the referral was done, she stated it needed to go to [facility] she is going to call to [facility] and see if they can use the referral and she will call us back. Eagan Shifflett,cma

## 2021-12-05 ENCOUNTER — Ambulatory Visit: Payer: Medicare Other | Admitting: Internal Medicine

## 2021-12-05 ENCOUNTER — Other Ambulatory Visit: Payer: Self-pay

## 2021-12-05 ENCOUNTER — Encounter: Payer: Self-pay | Admitting: Internal Medicine

## 2021-12-05 VITALS — BP 140/74 | HR 70 | Temp 98.1°F | Ht 67.0 in | Wt 145.4 lb

## 2021-12-05 DIAGNOSIS — K0530 Chronic periodontitis, unspecified: Secondary | ICD-10-CM | POA: Diagnosis not present

## 2021-12-05 DIAGNOSIS — F3170 Bipolar disorder, currently in remission, most recent episode unspecified: Secondary | ICD-10-CM | POA: Diagnosis not present

## 2021-12-05 DIAGNOSIS — F458 Other somatoform disorders: Secondary | ICD-10-CM | POA: Diagnosis not present

## 2021-12-05 MED ORDER — AMOXICILLIN-POT CLAVULANATE 875-125 MG PO TABS: 1.0000 | ORAL_TABLET | Freq: Two times a day (BID) | ORAL | 0 refills | Status: DC

## 2021-12-05 NOTE — Patient Instructions (Addendum)
Periodontal Disease Periodontal disease, also called gum disease or periodontitis, is inflammation or infection that affects the tissue that surrounds and supports the teeth (periodontal tissue). Periodontal tissue includes the gums (gingivae), the tissues that hold the teeth in place (periodontal ligaments), and the bone surrounding the tooth (alveolar bone). Periodontal disease can affect the tissue around one tooth or many teeth. If this condition is not treated, it can cause you to lose your teeth. What are the causes? This condition is usually caused by plaque. Plaque contains harmful bacteria that can produce acids and other waste products that cause gums to become swollen and infected. If periodontal disease gets worse, it can also damage other supporting tissues. Plaque can develop due to: Poor oral care, such as not brushing teeth enough or not visiting the dentist regularly. Eating and drinking too many sugary foods and beverages. If the plaque remains on the teeth for a long time, it hardens to form tartar, which can only be removed by a dentist. As the disease progresses, space forms between the teeth and surrounding tissues (pocketing), which can lead to tooth loss. What increases the risk? This condition is more likely to develop in people who: Smoke. Use tobacco. Clench or grind their teeth. Use drugs and other substances. Are having changes in hormones that are caused by puberty, menopause, or pregnancy. Are under stress. Are taking certain medicines, such as steroids, antiseizure medicines, or medicines to treat cancer. Have diabetes. Have poor nutrition. Have a disease that interferes with the body's disease-fighting system (immune system). Have a family history of periodontal disease. What are the signs or symptoms? Symptoms of this condition include: Red or swollen gums. Bad breath that does not go away. Gums that have pulled away (receded) from the teeth. Gums that  bleed easily. Teeth that are loose or separating. Pain when chewing. Changes in the way your teeth fit together. Sensitive teeth. How is this diagnosed? This condition is diagnosed with an exam of the tissue around your teeth. Your health care provider may also take an X-ray of your teeth and the tissue around them. He or she will also ask about your medical history. How is this treated? Treatment for this condition depends on the extent of the disease, which is determined by a dental exam. Treatment may include: Brushing and flossing regularly. A deep dental cleaning that requires scraping the buildup of plaque and tartar from below the gum line (scaling and root planing). This may be needed if the disease progresses. Antibiotic medicines. These may be taken by mouth, as a rinse, or placed directly into affected areas. Surgery, in some severe cases. This may include: Surgery flap. Gums are lifted up to remove tartar deposits or to reduce a pocket of periodontal disease. Bone and tissue grafting. Damaged areas are replaced in order to help healthy bone and tissue grow. Follow these instructions at home: Oral hygiene   Practice good oral hygiene. To do this: Brush your teeth twice a day with a soft toothbrush. Floss between your teeth every day. Get regular dental exams.  Medicines Take over-the-counter and prescription medicines only as told by your health care provider. If you were prescribed an antibiotic medicine or a rinse, take or use it as told by your health care provider. Do not stop taking or using the antibiotic even if your condition improves. General instructions Do not use any products that contain nicotine or tobacco, such as cigarettes, e-cigarettes, and chewing tobacco. These can delay healing. If you  need help quitting, ask your health care provider. Eat a healthy diet that includes plenty of vegetables, fruits, whole grains, low-fat dairy products, and lean protein. Do  not eat a lot of foods that are high in solid fats, added sugars, or salt. Avoid beverages that contain a lot of sugar. Where to find more information American Dental Association: www.mouthhealthy.org Contact a health care provider if: Your symptoms do not improve. Get help right away if: You have swelling in your face, neck, or jaw. You have severe pain that does not get better with medicine. You have a fever. Summary Periodontal disease, also called gum disease or periodontitis, is inflammation or infection that affects the tissue that surrounds and supports the teeth (periodontal tissue). You are more likely to get this disease if you smoke, use tobacco, have a family history of the disease, or have certain conditions, such as diabetes. Practice good oral hygiene. Brush your teeth twice a day with a soft toothbrush. Floss between your teeth every day. Do not use any products that contain nicotine or tobacco, such as cigarettes, e-cigarettes, and chewing tobacco. If you need help quitting, ask your health care provider. This information is not intended to replace advice given to you by your health care provider. Make sure you discuss any questions you have with your health care provider. Document Revised: 07/04/2020 Document Reviewed: 07/16/2019 Elsevier Patient Education  2022 [place].  Teeth Grinding Teeth grinding is a common condition in which a person grinds or clenches his or her teeth. This condition is also called bruxism. You can grind or clench your teeth without knowing that you are doing it. This can lead to tooth damage and jaw pain. What are the causes? The cause of this condition is not known. However, teeth grinding may be associated with: Untreated dental problems. Teeth that do not line up correctly (malocclusion). Stress or worry. Earaches. Allergies. Some medicines. Upper respiratory infections. Sleep disorders. What increases the risk? This condition is  more likely to develop in: Children. People who have a family history of teeth grinding. People who consume nicotine, caffeine, or excess amounts of alcohol before sleeping. People who feel stressed. What are the signs or symptoms? Symptoms associated with this condition include: Earaches. Teeth that are sensitive to heat, cold, and sweetness. Damaged teeth. Jaw pain or facial pain. Jaw problems, such as hearing a clicking sound when you open or close your mouth. Mild headaches. Muscle spasms. Trouble sleeping. Trouble eating. In some cases, there are no symptoms. How is this diagnosed? This condition is diagnosed with a medical history and dental exam. To rule out other conditions, you may also have tests, including: X-rays. Blood tests. How is this treated? There is no cure for this condition. Treatment can help control your symptoms and prevent further damage to your teeth. Treatment may include: A night guard. This dental appliance, sometimes referred to as an occlusal guard, is fitted to your teeth. The guard serves as a barrier between your top and bottom teeth so that you grind on the night guard instead. A mouth guard. This may move your lower jaw forward (mandibular advancement device). Some mouth guards also work to correct teeth that do not line up correctly or stabilize teeth after correction, such as with retainers. Medication. In more severe cases, medication may be prescribed at bedtime to relax your jaw muscles. Relaxation techniques. Exercise, yoga, meditation, or hypnosis may be prescribed to reduce stress if this is causing you to grind your teeth.  Sufficient sleep. Try to get 8 hours of sleep every night. As you lie in bed, try to keep your face, mouth, and jaw muscles relaxed. You may be told to avoid the following: Sleeping on your back. Try sleeping on your side or your abdomen instead. Chewing gum. Eating foods that are difficult to chew. Consuming alcohol,  caffeine, and nicotine, especially before bedtime. Dehydration. Drink enough fluids to keep your urine pale yellow. Follow these instructions at home: General instructions  Your health care provider may do the following: Make you a night guard and give instructions on how to use it. Wear it as told by your health care provider. Prescribe over-the-counter and prescription medicines. Recommend applying a warm, wet cloth (warm compress) to your face to help relax your jaw muscles. Do this as told by your health care provider. Prescribe jaw exercises. Keep all follow-up visits. This is important. Contact a health care provider if: Your symptoms get worse. New symptoms appear. You have trouble eating. You have trouble opening your mouth. Summary Teeth grinding is a common condition in which a person grinds or clenches his or her teeth. Symptoms can include jaw problems, facial pain, sensitive teeth, mild headaches, or trouble eating or sleeping. There is no cure for this condition. Treatment can help ease symptoms and prevent further damage to the teeth. Treatment options may include wearing a night guard, wearing a mouth guard to correct teeth that do not line up correctly, taking medication, and finding ways to ease stress. This information is not intended to replace advice given to you by your health care provider. Make sure you discuss any questions you have with your health care provider. Document Revised: 03/01/2020 Document Reviewed: 03/01/2020 Elsevier Patient Education  2022 [employer].

## 2021-12-05 NOTE — Progress Notes (Signed)
Chief Complaint  Patient presents with   Jaw Pain    Onset 09/26/21 when Patient was having a procedure to mold her mouth for a new mouth guard for teeth grinding at night. During the procedure Patient heard an audible pop and felt it.   Needing a referral to oral surgery per dentist recommendation.    F/u  1. Requests referral [facility] on [address] for jaw pain grinding teeth at night wearing mouth guard and has cracked teeth and wearing mouth guard due also periodontal disease requests treatment and referral had 18 Xrays   2. 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  HENT:  Negative for hearing loss.   Eyes:  Negative for blurred vision.  Respiratory:  Negative for shortness of breath.   Cardiovascular:  Negative for chest pain.  Gastrointestinal:  Negative for abdominal pain and blood in stool.  Genitourinary:  Negative for dysuria.  Musculoskeletal:  Negative for falls and joint pain.  Skin:  Negative for rash.  Neurological:  Negative for headaches.  Psychiatric/Behavioral:  Negative for depression.   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 Thurmond Butts - every 3 months   Broken foot    left    Contact dermatitis and other eczema due to other specified agent    Contusion of unspecified part of lower limb    Essential hypertension, benign    Heart murmur    Other and unspecified hyperlipidemia    Other specified disorders of thyroid    Reflux esophagitis    Scalp laceration 04/13/2019   Tachycardia    a. isolated episode, seen in ED with negative work up   Past Surgical History:  Procedure Laterality Date   CATARACT EXTRACTION     HAMMER TOE SURGERY  11/30/2021   right 4th toe 11/10/21 Dr. Sherryle Lis   SALPINGECTOMY  Left    STERILIZATION     Her decision since dz of bipolar   Family History  Problem Relation Age of Onset   Arrhythmia Mother        A-Fib   Heart failure Mother    Hyperlipidemia Mother    Hypertension Mother    Alzheimer's disease Mother    Hypertension Father    Hyperlipidemia Father    Mental retardation Father 57       Suicide   Hypertension Brother    Hyperlipidemia Brother    Hypertension Brother    Hyperlipidemia Brother    Asthma Brother    COPD Brother    Hyperlipidemia Brother    Hypertension Brother    Alcohol abuse Brother    Depression Brother    Emphysema Brother    Hyperlipidemia Brother    Hypertension Brother    Breast cancer Maternal Aunt    Social History   Socioeconomic History   Marital status: Divorced    Spouse name: Not on file   Number of children: 0   Years of education: 14   Highest education level: Not on file  Occupational History   Not on file  Tobacco Use   Smoking status: Former    Packs/day: 0.50    Years: 13.00    Pack years: 6.50  Types: Cigarettes    Quit date: 01/23/2000    Years since quitting: 21.8   Smokeless tobacco: Never  Vaping Use   Vaping Use: Never used  Substance and Sexual Activity   Alcohol use: No   Drug use: No   Sexual activity: Never  Other Topics Concern   Not on file  Social History Narrative   Ms. Gingrich grew up in [place], [place]. She lives in [place]. Ms. Geary is divorced. She has 2 cats (Charlie and Angie). 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 She also volunteers 2 days at [facility]. She enjoys reading, garding and cooking.   Social Determinants of Health   Financial Resource Strain: Low Risk    Difficulty of Paying Living Expenses: Not hard at all  Food Insecurity: No Food Insecurity   Worried About Charity fundraiser in the Last Year: Never true   [place] in the Last Year: Never true  Transportation Needs: No Transportation Needs   Lack of Transportation  (Medical): No   Lack of Transportation (Non-Medical): No  Physical Activity: Not on file  Stress: No Stress Concern Present   Feeling of Stress : Not at all  Social Connections: Moderately Isolated   Frequency of Communication with Friends and Family: More than three times a week   Frequency of Social Gatherings with Friends and Family: Once a week   Attends Religious Services: Never   Marine scientist or Organizations: Yes   Attends Archivist Meetings: Never   Marital Status: Divorced  Human resources officer Violence: Not At Risk   Fear of Current or Ex-Partner: No   Emotionally Abused: No   Physically Abused: No   Sexually Abused: No   Current Meds  Medication Sig   acetaminophen (TYLENOL) 325 MG tablet Take 650 mg by mouth every 6 (six) hours as needed.   amoxicillin-clavulanate (AUGMENTIN) 875-125 MG tablet Take 1 tablet by mouth 2 (two) times daily. X 7-14 days with food   Ascorbic Acid (VITAMIN C) 1000 MG tablet Take 1,000 mg by mouth daily.   aspirin 81 MG tablet Take 81 mg by mouth daily.   atorvastatin (LIPITOR) 20 MG tablet Take 1 tablet (20 mg total) by mouth daily.   B Complex Vitamins (VITAMIN B COMPLEX PO) Take 1 tablet by mouth daily.   Bacillus Coagulans-Inulin (ALIGN PREBIOTIC-PROBIOTIC PO) Take by mouth.   Biotin 5 MG CAPS Take 5 mg by mouth daily.   calcium carbonate (OS-CAL) 600 MG TABS tablet Take 600 mg by mouth daily.    Cholecalciferol (VITAMIN D3) 50 MCG (2000 UT) capsule Take 2,000 Units by mouth daily.   COVID-19 mRNA bivalent vaccine, Moderna, (MODERNA COVID-19 BIVAL BOOSTER) 50 MCG/0.5ML injection Inject into the muscle.   diphenhydrAMINE (BENADRYL) 25 mg capsule Take 25 mg by mouth daily as needed for allergies.   docusate sodium (COLACE) 100 MG capsule Take 100 mg by mouth daily.   doxazosin (CARDURA) 4 MG tablet Take 0.5 tablets (2 mg total) by mouth 2 (two) times daily. May take extra 2 mg daily as needed for BP> 160   EPINEPHrine (EPIPEN  2-PAK) 0.3 mg/0.3 mL IJ SOAJ injection Inject 0.3 mg into the muscle as needed for anaphylaxis.   fexofenadine (ALLEGRA) 180 MG tablet Take 180 mg by mouth daily as needed for allergies.   Flaxseed, Linseed, (FLAXSEED OIL PO) Take 1 capsule by mouth daily.   levothyroxine (SYNTHROID, LEVOTHROID) 125 MCG tablet Take 125 mcg by mouth  daily before breakfast.   lithium carbonate (LITHOBID) 300 MG CR tablet Take 600 mg by mouth at bedtime.   magnesium oxide (MAG-OX) 400 MG tablet Take 400 mg by mouth daily.   Multiple Vitamins-Minerals (MULTIVITAMIN WITH MINERALS) tablet Take 1 tablet by mouth daily.   Multiple Vitamins-Minerals (VISION FORMULA/LUTEIN) TABS Take 1 tablet by mouth in the morning and at bedtime.   Omega-3 Fatty Acids (FISH OIL) 1000 MG CAPS Take 3,000 mg by mouth daily.   Oxcarbazepine (TRILEPTAL) 300 MG tablet Take 300 mg by mouth in the morning, at noon, and at bedtime.   sulfamethoxazole-trimethoprim (BACTRIM DS) 800-160 MG tablet Take 1 tablet by mouth every 12 (twelve) hours.   vitamin B-12 (CYANOCOBALAMIN) 1000 MCG tablet Take 1,000 mcg by mouth daily.   vitamin E 1000 UNIT capsule Take 1,000 Units by mouth daily.   zinc gluconate 50 MG tablet Take 50 mg by mouth daily.   ziprasidone (GEODON) 20 MG capsule Take 40 mg by mouth at bedtime.   Allergies  Allergen Reactions   Apple Anaphylaxis   Ace Inhibitors     Angioedema    Angiotensin Receptor Blockers     Angioedema    Benicar [Olmesartan]     Mouth & Lip Swelling, "I swell from the waist down". Throat swelling   Poison Ivy Extract [Poison Ivy Extract]     Blisters   Purell Instant Hand [Alcohol] Swelling   Triclosan    Recent Results (from the past 2160 hour(s))  Comp Met (CMET)     Status: Abnormal   Collection Time: 09/13/21  1:32 PM  Result Value Ref Range   Sodium 138 135 - 145 mEq/L   Potassium 4.4 3.5 - 5.1 mEq/L   Chloride 104 96 - 112 mEq/L   CO2 28 19 - 32 mEq/L   Glucose, Bld 75 70 - 99 mg/dL   BUN  25 (H) 6 - 23 mg/dL   Creatinine, Ser 0.96 0.40 - 1.20 mg/dL   Total Bilirubin 0.4 0.2 - 1.2 mg/dL   Alkaline Phosphatase 100 39 - 117 U/L   AST 28 0 - 37 U/L   ALT 30 0 - 35 U/L   Total Protein 7.3 6.0 - 8.3 g/dL   Albumin 4.4 3.5 - 5.2 g/dL   GFR 59.79 (L) >60.00 mL/min    Comment: Calculated using the CKD-EPI Creatinine Equation (2021)   Calcium 9.4 8.4 - 10.5 mg/dL  CBC w/Diff     Status: Abnormal   Collection Time: 09/13/21  1:32 PM  Result Value Ref Range   WBC 6.1 4.0 - 10.5 K/uL   RBC 4.29 3.87 - 5.11 Mil/uL   Hemoglobin 11.5 (L) 12.0 - 15.0 g/dL   HCT 36.3 36.0 - 46.0 %   MCV 84.6 78.0 - 100.0 fl   MCHC 31.6 30.0 - 36.0 g/dL   RDW 16.3 (H) 11.5 - 15.5 %   Platelets 183.0 150.0 - 400.0 K/uL   Neutrophils Relative % 68.5 43.0 - 77.0 %   Lymphocytes Relative 19.2 12.0 - 46.0 %   Monocytes Relative 7.5 3.0 - 12.0 %   Eosinophils Relative 4.5 0.0 - 5.0 %   Basophils Relative 0.3 0.0 - 3.0 %   Neutro Abs 4.2 1.4 - 7.7 K/uL   Lymphs Abs 1.2 0.7 - 4.0 K/uL   Monocytes Absolute 0.5 0.1 - 1.0 K/uL   Eosinophils Absolute 0.3 0.0 - 0.7 K/uL   Basophils Absolute 0.0 0.0 - 0.1 K/uL  Sedimentation rate  Status: Abnormal   Collection Time: 09/13/21  1:32 PM  Result Value Ref Range   Sed Rate 41 (H) 0 - 30 mm/hr  QuantiFERON-TB Gold Plus     Status: None   Collection Time: 09/13/21  1:32 PM  Result Value Ref Range   QuantiFERON-TB Gold Plus NEGATIVE NEGATIVE    Comment: Negative test result. M. tuberculosis complex  infection unlikely.    NIL 0.04 IU/mL   Mitogen-NIL >10.00 IU/mL   TB1-NIL 0.00 IU/mL   TB2-NIL 0.03 IU/mL    Comment: . The Nil tube value reflects the background interferon gamma immune response of the patient's blood sample. This value has been subtracted from the patient's displayed TB and Mitogen results. . Lower than expected results with the Mitogen tube prevent false-negative Quantiferon readings by detecting a patient with a potential  immune suppressive condition and/or suboptimal pre-analytical specimen handling. . The TB1 Antigen tube is coated with the M. tuberculosis-specific antigens designed to elicit responses from TB antigen primed CD4+ helper T-lymphocytes. . The TB2 Antigen tube is coated with the M. tuberculosis-specific antigens designed to elicit responses from TB antigen primed CD4+ helper and CD8+ cytotoxic T-lymphocytes. . For additional information, please refer to https://education.questdiagnostics.com/faq/FAQ204 (This link is being provided for informational/ educational purposes only.) .   TSH     Status: None   Collection Time: 09/13/21  1:32 PM  Result Value Ref Range   TSH 1.67 0.35 - 5.50 uIU/mL  Direct LDL     Status: None   Collection Time: 11/24/21  2:26 PM  Result Value Ref Range   Direct LDL 94 <100 mg/dL    Comment: Greatly elevated Triglycerides values (>1200 mg/dL) interfere with the dLDL assay. As no Triglycerides  testing was ordered, interpret results with caution. . Desirable range <100 mg/dL for primary prevention;   <70 mg/dL for patients with CHD or diabetic patients  with > or = 2 CHD risk factors. .   Hepatic function panel     Status: None   Collection Time: 11/24/21  2:26 PM  Result Value Ref Range   Total Protein 7.9 6.1 - 8.1 g/dL   Albumin 4.7 3.6 - 5.1 g/dL   Globulin 3.2 1.9 - 3.7 g/dL (calc)   AG Ratio 1.5 1.0 - 2.5 (calc)   Total Bilirubin 0.3 0.2 - 1.2 mg/dL   Bilirubin, Direct 0.1 0.0 - 0.2 mg/dL   Indirect Bilirubin 0.2 0.2 - 1.2 mg/dL (calc)   Alkaline phosphatase (APISO) 95 37 - 153 U/L   AST 28 10 - 35 U/L   ALT 27 6 - 29 U/L   Objective  Body mass index is 22.77 kg/m. Wt Readings from Last 3 Encounters:  12/05/21 145 lb 6.4 oz (66 kg)  11/23/21 144 lb 6 oz (65.5 kg)  11/14/21 143 lb (64.9 kg)   Temp Readings from Last 3 Encounters:  12/05/21 98.1 F (36.7 C) (Oral)  11/15/21 97.6 F (36.4 C)  10/16/21 98.3 F (36.8 C) (Oral)    BP Readings from Last 3 Encounters:  12/05/21 140/74  11/23/21 118/68  11/15/21 127/79   Pulse Readings from Last 3 Encounters:  12/05/21 70  11/23/21 61  11/15/21 80    Physical Exam Vitals and nursing note reviewed.  Constitutional:      Appearance: Normal appearance. She is well-developed and well-groomed.  HENT:     Head: Normocephalic and atraumatic.     Right Ear: There is impacted cerumen.  Eyes:     Conjunctiva/sclera:  Conjunctivae normal.     Pupils: Pupils are equal, round, and reactive to light.  Cardiovascular:     Rate and Rhythm: Normal rate and regular rhythm.     Heart sounds: Normal heart sounds. No murmur heard. Pulmonary:     Effort: Pulmonary effort is normal.     Breath sounds: Normal breath sounds.  Abdominal:     General: Abdomen is flat. Bowel sounds are normal.     Tenderness: There is no abdominal tenderness.  Musculoskeletal:        General: No tenderness.  Skin:    General: Skin is warm and dry.  Neurological:     General: No focal deficit present.     Mental Status: She is alert and oriented to person, place, and time. Mental status is at baseline.     Cranial Nerves: Cranial nerves 2-12 are intact.     Motor: Motor function is intact.     Coordination: Coordination is intact.     Gait: Gait is intact.  Psychiatric:        Attention and Perception: Attention and perception normal.        Mood and Affect: Mood and affect normal.        Speech: Speech normal.        Behavior: Behavior normal. Behavior is cooperative.        Thought Content: Thought content normal.        Cognition and Memory: Cognition and memory normal.        Judgment: Judgment normal.    Assessment  Plan  Grinding teeth - Plan: Ambulatory referral to Dentistry  Periodontitis - Plan: amoxicillin-clavulanate (AUGMENTIN) 875-125 MG tablet bid x 7-14 days , Ambulatory referral to Dentistry  Bipolar disorder in full remission, most recent episode unspecified type  [facility])  F/u [place]   Provider: Dr. Olivia Mackie McLean-Scocuzza-Internal Medicine

## 2021-12-18 ENCOUNTER — Ambulatory Visit: Payer: Medicare HMO | Admitting: Family Medicine

## 2021-12-20 ENCOUNTER — Ambulatory Visit (INDEPENDENT_AMBULATORY_CARE_PROVIDER_SITE_OTHER): Payer: Medicare Other

## 2021-12-20 ENCOUNTER — Other Ambulatory Visit: Payer: Self-pay

## 2021-12-20 ENCOUNTER — Encounter: Payer: Self-pay | Admitting: Podiatry

## 2021-12-20 ENCOUNTER — Ambulatory Visit (INDEPENDENT_AMBULATORY_CARE_PROVIDER_SITE_OTHER): Payer: Medicare Other | Admitting: Podiatry

## 2021-12-20 DIAGNOSIS — Z9889 Other specified postprocedural states: Secondary | ICD-10-CM | POA: Diagnosis not present

## 2021-12-20 DIAGNOSIS — M2041 Other hammer toe(s) (acquired), right foot: Secondary | ICD-10-CM | POA: Diagnosis not present

## 2021-12-20 NOTE — Progress Notes (Signed)
°  Subjective:  Patient ID: Amy Mcconnell, female    DOB: [birth year],  MRN: [id number]  Chief Complaint  Patient presents with   Routine Post Op    POV #3 DOS 11/10/2020 RT 4TH HAMMERTOE CORRECTION   "its still a little pinkish red and It feels like someone is holding a match under my toe"     71 y.o. female returns for post-op check.  Doing well still has a little bit of burning pain and swelling  Review of Systems: Negative except as noted in the HPI. Denies N/V/F/Ch.   Objective:   There were no vitals filed for this visit.  There is no height or weight on file to calculate BMI. Constitutional Well developed. Well nourished.  Vascular Foot warm and well perfused. Capillary refill normal to all digits.  Calf is soft and supple, no posterior calf or knee pain, negative Homans' sign  Neurologic Normal speech. Oriented to person, place, and time. Epicritic sensation to light touch grossly present bilaterally.  Dermatologic o  Orthopedic: Mild edema fourth toe   Multiple view plain film radiographs: New films taken today show she is status post arthroplasty fourth toe, correction is maintained no bony regrowth Assessment:   1. Hammertoe of right foot   2. S/P foot surgery, right    Plan:  Patient was evaluated and treated and all questions answered.  S/p foot surgery right -Doing very well.  She still has some swelling which I advised is normal and may continue for the next 2 to 3 months.  I dispensed her a Coban compression wrap and she will wrap the toe with this.  I will see her back on as an on needed basis.  She may return to work and activity without restrictions and wear shoe gear as tolerated.  Return if symptoms worsen or fail to improve.

## 2022-01-04 ENCOUNTER — Ambulatory Visit (INDEPENDENT_AMBULATORY_CARE_PROVIDER_SITE_OTHER): Payer: Medicare Other | Admitting: Psychiatry

## 2022-01-04 ENCOUNTER — Encounter: Payer: Self-pay | Admitting: Psychiatry

## 2022-01-04 ENCOUNTER — Other Ambulatory Visit: Payer: Self-pay

## 2022-01-04 VITALS — BP 149/75 | HR 96 | Temp 98.5°F | Wt 146.4 lb

## 2022-01-04 DIAGNOSIS — Z79899 Other long term (current) drug therapy: Secondary | ICD-10-CM | POA: Diagnosis not present

## 2022-01-04 DIAGNOSIS — F3176 Bipolar disorder, in full remission, most recent episode depressed: Secondary | ICD-10-CM

## 2022-01-04 MED ORDER — LITHIUM CARBONATE ER 300 MG PO TBCR: 600.0000 mg | EXTENDED_RELEASE_TABLET | Freq: Every day | ORAL | 2 refills | Status: DC
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
## 2022-01-08 ENCOUNTER — Other Ambulatory Visit: Payer: Self-pay | Admitting: Psychiatry

## 2022-01-08 DIAGNOSIS — F3176 Bipolar disorder, in full remission, most recent episode depressed: Secondary | ICD-10-CM | POA: Diagnosis not present

## 2022-01-08 DIAGNOSIS — Z79899 Other long term (current) drug therapy: Secondary | ICD-10-CM | POA: Diagnosis not present

## 2022-01-09 ENCOUNTER — Telehealth: Payer: Self-pay | Admitting: Psychiatry

## 2022-01-09 LAB — LITHIUM LEVEL: Lithium Lvl: 0.5 mmol/L (ref 0.5–1.2)

## 2022-01-09 LAB — BUN+CREAT
BUN/Creatinine Ratio: 33 — ABNORMAL HIGH (ref 12–28)
BUN: 26 mg/dL (ref 8–27)
Creatinine, Ser: 0.79 mg/dL (ref 0.57–1.00)
eGFR: 80 mL/min/{1.73_m2} (ref >59)

## 2022-01-09 LAB — TSH: TSH: 1.54 u[IU]/mL (ref 0.450–4.500)

## 2022-01-09 NOTE — Telephone Encounter (Signed)
Contacted patient and discussed her most recent labs-lithium level at 0.5. ?BUN/creatinine-within normal limits ? ?BUN/creatinine ratio slightly elevated. ? ?Patient to follow up with primary care provider and discuss the same.  Patient agreeable. ? ? ?

## 2022-01-10 NOTE — Telephone Encounter (Signed)
I called and spoke with the patient and informed her of her lab results and she understood. Coyle Stordahl,cma  ?

## 2022-01-10 NOTE — Telephone Encounter (Signed)
Noted.  Amy Mcconnell please let the patient know that she might be slightly dehydrated on her lab work based on the BUN/creatinine ratio.  Her BUN and creatinine are both within the acceptable range.  She should try to stay well-hydrated. ?

## 2022-01-29 ENCOUNTER — Other Ambulatory Visit: Payer: Self-pay

## 2022-01-29 ENCOUNTER — Encounter: Payer: Self-pay | Admitting: Family Medicine

## 2022-01-29 ENCOUNTER — Ambulatory Visit (INDEPENDENT_AMBULATORY_CARE_PROVIDER_SITE_OTHER): Payer: Medicare Other | Admitting: Family Medicine

## 2022-01-29 DIAGNOSIS — M2041 Other hammer toe(s) (acquired), right foot: Secondary | ICD-10-CM

## 2022-01-29 DIAGNOSIS — E039 Hypothyroidism, unspecified: Secondary | ICD-10-CM | POA: Diagnosis not present

## 2022-01-29 DIAGNOSIS — I10 Essential (primary) hypertension: Secondary | ICD-10-CM | POA: Diagnosis not present

## 2022-01-29 DIAGNOSIS — E782 Mixed hyperlipidemia: Secondary | ICD-10-CM | POA: Diagnosis not present

## 2022-01-29 NOTE — Assessment & Plan Note (Signed)
TSH is well controlled.  She has been taking biotin.  Discussed stopping biotin 1 week prior to future lab draws so we can ensure her TSH is well controlled.  She will continue levothyroxine 125 mcg once daily. ?

## 2022-01-29 NOTE — Assessment & Plan Note (Signed)
Well-controlled.  She will continue Cardura 2 mg twice daily.  

## 2022-01-29 NOTE — Patient Instructions (Signed)
Nice to see you. ?Please stop your biotin 7 days prior to your next lab draw which will occur at your next visit in 6 months. ?

## 2022-01-29 NOTE — Assessment & Plan Note (Signed)
Previously well controlled.  She will continue Lipitor 20 mg once daily. ?

## 2022-01-29 NOTE — Progress Notes (Signed)
?Tommi Rumps, MD ?Phone: [phone] ? ?Amy Mcconnell is a 71 y.o. female who presents today for f/u. ? ?HYPERTENSION ?Disease Monitoring ?Home BP Monitoring not checking frequently Chest pain- no    Dyspnea- no ?Medications ?Compliance-  taking cardura.   Edema- no ?BMET ?   ?Component Value Date/Time  ? NA 138 09/13/2021 1332  ? NA 143 11/23/2014 0000  ? NA 138 01/17/2014 0842  ? K 4.4 09/13/2021 1332  ? K 4.0 01/17/2014 0842  ? CL 104 09/13/2021 1332  ? CL 104 01/17/2014 0842  ? CO2 28 09/13/2021 1332  ? CO2 29 01/17/2014 0842  ? GLUCOSE 75 09/13/2021 1332  ? GLUCOSE 84 01/17/2014 0842  ? BUN 26 01/08/2022 0950  ? BUN 22 (H) 01/17/2014 2440  ? CREATININE 0.79 01/08/2022 0950  ? CREATININE 0.74 01/17/2014 0842  ? CALCIUM 9.4 09/13/2021 1332  ? CALCIUM 9.4 01/17/2014 0842  ? GFRNONAA >60 08/04/2021 0548  ? GFRNONAA >60 01/17/2014 0842  ? GFRAA >60 04/14/2019 1631  ? GFRAA >60 01/17/2014 0842  ? ?HYPERLIPIDEMIA ?Symptoms ?Chest pain on exertion:  no   Leg claudication:   no ?Medications: ?Compliance- taking lipitor Right upper quadrant pain- no  Muscle aches- no ?Lipid Panel  ?   ?Component Value Date/Time  ? CHOL 140 06/23/2021 0950  ? TRIG 90.0 06/23/2021 0950  ? HDL 63.50 06/23/2021 0950  ? CHOLHDL 2 06/23/2021 0950  ? VLDL 18.0 06/23/2021 0950  ? [place] 58 06/23/2021 0950  ? LDLDIRECT 94 11/24/2021 1426  ? ?HYPOTHYROIDISM ?Disease Monitoring ?Weight changes: no  Skin Changes: no  Heat/Cold intolerance: no ? ?Medication Monitoring ?Compliance:  taking synthroid   ?Last TSH:   ?Lab Results  ?Component Value Date  ? TSH 1.540 01/08/2022  ? ?Hammertoe: Patient underwent surgical intervention with podiatry.  She notes that has been healing quite well.  The swelling has progressively been improving. ? ?Social History  ? ?Tobacco Use  ?Smoking Status Former  ? Packs/day: 0.50  ? Years: 13.00  ? Pack years: 6.50  ? Types: Cigarettes  ? Quit date: 01/23/2000  ? Years since quitting: 22.0  ?Smokeless Tobacco  Never  ? ? ?Current Outpatient Medications on File Prior to Visit  ?Medication Sig Dispense Refill  ? Ascorbic Acid (VITAMIN C) 1000 MG tablet Take 1,000 mg by mouth daily.    ? atorvastatin (LIPITOR) 20 MG tablet Take 1 tablet (20 mg total) by mouth daily. 90 tablet 3  ? B Complex Vitamins (VITAMIN B COMPLEX PO) Take 1 tablet by mouth daily.    ? Bacillus Coagulans-Inulin (ALIGN PREBIOTIC-PROBIOTIC PO) Take by mouth.    ? Biotin 5 MG CAPS Take 5 mg by mouth daily.    ? calcium carbonate (OS-CAL) 600 MG TABS tablet Take 600 mg by mouth daily.     ? Cholecalciferol (VITAMIN D3) 50 MCG (2000 UT) capsule Take 2,000 Units by mouth daily.    ? diphenhydrAMINE (BENADRYL) 25 mg capsule Take 25 mg by mouth daily as needed for allergies.    ? docusate sodium (COLACE) 100 MG capsule Take 100 mg by mouth daily.    ? doxazosin (CARDURA) 4 MG tablet Take 0.5 tablets (2 mg total) by mouth 2 (two) times daily. May take extra 2 mg daily as needed for BP> 160 110 tablet 3  ? EPINEPHrine (EPIPEN 2-PAK) 0.3 mg/0.3 mL IJ SOAJ injection Inject 0.3 mg into the muscle as needed for anaphylaxis. 2 each 0  ? fexofenadine (ALLEGRA) 180 MG tablet  Take 180 mg by mouth daily as needed for allergies.    ? Flaxseed, Linseed, (FLAXSEED OIL PO) Take 1 capsule by mouth daily.    ? levothyroxine (SYNTHROID, LEVOTHROID) 125 MCG tablet Take 125 mcg by mouth daily before breakfast.    ? lithium carbonate (LITHOBID) 300 MG CR tablet Take 2 tablets (600 mg total) by mouth at bedtime. 60 tablet 2  ? magnesium oxide (MAG-OX) 400 MG tablet Take 400 mg by mouth daily.    ? Multiple Vitamins-Minerals (MULTIVITAMIN WITH MINERALS) tablet Take 1 tablet by mouth daily.    ? Multiple Vitamins-Minerals (VISION FORMULA/LUTEIN) TABS Take 1 tablet by mouth in the morning and at bedtime.    ? Omega-3 Fatty Acids (FISH OIL) 1000 MG CAPS Take 3,000 mg by mouth daily.    ? Oxcarbazepine (TRILEPTAL) 300 MG tablet Take 300 mg by mouth as directed. Take 1 tablet daily AM and  2 tablets at bedtime    ? vitamin B-12 (CYANOCOBALAMIN) 1000 MCG tablet Take 1,000 mcg by mouth daily.    ? vitamin E 1000 UNIT capsule Take 1,000 Units by mouth daily.    ? zinc gluconate 50 MG tablet Take 50 mg by mouth daily.    ? ziprasidone (GEODON) 20 MG capsule Take 20 mg by mouth at bedtime.    ? ?No current facility-administered medications on file prior to visit.  ? ? ? ?ROS see history of present illness ? ?Objective ? ?Physical Exam ?Vitals:  ? 01/29/22 0930  ?BP: 120/80  ?Pulse: 69  ?Temp: 98.4 ?F (36.9 ?C)  ?SpO2: 97%  ? ? ?BP Readings from Last 3 Encounters:  ?01/29/22 120/80  ?01/04/22 (!) 149/75  ?12/05/21 140/74  ? ?Wt Readings from Last 3 Encounters:  ?01/29/22 143 lb 9.6 oz (65.1 kg)  ?01/04/22 146 lb 6.4 oz (66.4 kg)  ?12/05/21 145 lb 6.4 oz (66 kg)  ? ? ?Physical Exam ?Constitutional:   ?   General: She is not in acute distress. ?   Appearance: She is not diaphoretic.  ?Cardiovascular:  ?   Rate and Rhythm: Normal rate and regular rhythm.  ?   Heart sounds: Normal heart sounds.  ?Pulmonary:  ?   Effort: Pulmonary effort is normal.  ?   Breath sounds: Normal breath sounds.  ?Musculoskeletal:  ?   Comments: Right fourth toe with minimal swelling, nontender  ?Skin: ?   General: Skin is warm and dry.  ?Neurological:  ?   Mental Status: She is alert.  ? ? ? ?Assessment/Plan: Please see individual problem list. ? ?Problem List Items Addressed This Visit   ? ? Essential hypertension  ?  Well-controlled.  She will continue Cardura 2 mg twice daily. ?  ?  ? Relevant Orders  ? Comp Met (CMET)  ? Lipid panel  ? Hammertoe of right foot  ?  Improved after surgery.  She will continue to monitor. ?  ?  ? Hyperlipidemia  ?  Previously well controlled.  She will continue Lipitor 20 mg once daily. ?  ?  ? Relevant Orders  ? Comp Met (CMET)  ? Lipid panel  ? Hypothyroidism  ?  TSH is well controlled.  She has been taking biotin.  Discussed stopping biotin 1 week prior to future lab draws so we can ensure her TSH  is well controlled.  She will continue levothyroxine 125 mcg once daily. ?  ?  ? Relevant Orders  ? TSH  ? ? ? ?Health Maintenance: patient notes she  is calling to schedule her colonoscopy. She is trying to arrange for a ride for this.  ? ?Return in about 6 months (around 08/01/2022) for Labs, 2 days later follow-up with PCP. ? ?This visit occurred during the SARS-CoV-2 public health emergency.  Safety protocols were in place, including screening questions prior to the visit, additional usage of staff PPE, and extensive cleaning of exam room while observing appropriate contact time as indicated for disinfecting solutions.  ? ? ?Tommi Rumps, MD ?[place] ? ?

## 2022-01-29 NOTE — Addendum Note (Signed)
Addended by: Leeanne Rio on: 01/29/2022 11:06 AM ? ? Modules accepted: Orders ? ?

## 2022-01-29 NOTE — Assessment & Plan Note (Signed)
Improved after surgery.  She will continue to monitor. ?

## 2022-02-05 ENCOUNTER — Telehealth: Payer: Self-pay | Admitting: Family Medicine

## 2022-02-05 ENCOUNTER — Telehealth: Payer: Self-pay

## 2022-02-05 ENCOUNTER — Telehealth: Payer: Self-pay | Admitting: Gastroenterology

## 2022-02-05 DIAGNOSIS — Z1211 Encounter for screening for malignant neoplasm of colon: Secondary | ICD-10-CM

## 2022-02-05 NOTE — Telephone Encounter (Signed)
Patient left vm to schedule colonoscopy, requesting call back. ?

## 2022-02-05 NOTE — Telephone Encounter (Signed)
Pt called in stating she would like a referral to see Dr. Durwin Reges  Fax  number [phone] ? ?

## 2022-02-05 NOTE — Telephone Encounter (Signed)
Wants to schedule colonoscopy but waiting on refferal ?

## 2022-02-05 NOTE — Telephone Encounter (Signed)
I called the patient to ask why did she want a referral to Dr. Allen Norris and she stated for her colonoscopy. Oleda Borski,cma  ?

## 2022-02-06 ENCOUNTER — Telehealth: Payer: Self-pay

## 2022-02-06 NOTE — Telephone Encounter (Signed)
Referral placed.

## 2022-02-06 NOTE — Telephone Encounter (Signed)
Patient will call us back this afternoon to schedule ?

## 2022-02-07 ENCOUNTER — Other Ambulatory Visit: Payer: Self-pay

## 2022-02-07 DIAGNOSIS — Z1211 Encounter for screening for malignant neoplasm of colon: Secondary | ICD-10-CM

## 2022-02-07 MED ORDER — SUTAB 1479-225-188 MG PO TABS: 12.0000 | ORAL_TABLET | Freq: Once | ORAL | 0 refills | Status: AC

## 2022-02-07 NOTE — Progress Notes (Signed)
Called patient to schedule she has to confirm with her ride and will call me back  ?

## 2022-02-07 NOTE — Progress Notes (Signed)
Gastroenterology Pre-Procedure Review ?---PATIENT HAS PREP ALREADY ?Request Date: 03/27/2022 ?Requesting Physician: Dr. Allen Norris ? ?PATIENT REVIEW QUESTIONS: The patient responded to the following health history questions as indicated:   ? ?1. Are you having any GI issues? no ?2. Do you have a personal history of Polyps? no ?3. Do you have a family history of Colon Cancer or Polyps? no ?4. Diabetes Mellitus? no ?5. Joint replacements in the past 12 months?no ?6. Major health problems in the past 3 months?no ?7. Any artificial heart valves, MVP, or defibrillator?no ?   ?MEDICATIONS & ALLERGIES:    ?Patient reports the following regarding taking any anticoagulation/antiplatelet therapy:   ?Plavix, Coumadin, Eliquis, Xarelto, Lovenox, Pradaxa, Brilinta, or Effient? no ?Aspirin? yes (81 mg) ? ?Patient confirms/reports the following medications:  ?Current Outpatient Medications  ?Medication Sig Dispense Refill  ? Ascorbic Acid (VITAMIN C) 1000 MG tablet Take 1,000 mg by mouth daily.    ? atorvastatin (LIPITOR) 20 MG tablet Take 1 tablet (20 mg total) by mouth daily. 90 tablet 3  ? B Complex Vitamins (VITAMIN B COMPLEX PO) Take 1 tablet by mouth daily.    ? Bacillus Coagulans-Inulin (ALIGN PREBIOTIC-PROBIOTIC PO) Take by mouth.    ? Biotin 5 MG CAPS Take 5 mg by mouth daily.    ? calcium carbonate (OS-CAL) 600 MG TABS tablet Take 600 mg by mouth daily.     ? Cholecalciferol (VITAMIN D3) 50 MCG (2000 UT) capsule Take 2,000 Units by mouth daily.    ? diphenhydrAMINE (BENADRYL) 25 mg capsule Take 25 mg by mouth daily as needed for allergies.    ? docusate sodium (COLACE) 100 MG capsule Take 100 mg by mouth daily.    ? doxazosin (CARDURA) 4 MG tablet Take 0.5 tablets (2 mg total) by mouth 2 (two) times daily. May take extra 2 mg daily as needed for BP> 160 110 tablet 3  ? EPINEPHrine (EPIPEN 2-PAK) 0.3 mg/0.3 mL IJ SOAJ injection Inject 0.3 mg into the muscle as needed for anaphylaxis. 2 each 0  ? fexofenadine (ALLEGRA) 180 MG  tablet Take 180 mg by mouth daily as needed for allergies.    ? Flaxseed, Linseed, (FLAXSEED OIL PO) Take 1 capsule by mouth daily.    ? levothyroxine (SYNTHROID, LEVOTHROID) 125 MCG tablet Take 125 mcg by mouth daily before breakfast.    ? lithium carbonate (LITHOBID) 300 MG CR tablet Take 2 tablets (600 mg total) by mouth at bedtime. 60 tablet 2  ? magnesium oxide (MAG-OX) 400 MG tablet Take 400 mg by mouth daily.    ? Multiple Vitamins-Minerals (MULTIVITAMIN WITH MINERALS) tablet Take 1 tablet by mouth daily.    ? Multiple Vitamins-Minerals (VISION FORMULA/LUTEIN) TABS Take 1 tablet by mouth in the morning and at bedtime.    ? Omega-3 Fatty Acids (FISH OIL) 1000 MG CAPS Take 3,000 mg by mouth daily.    ? Oxcarbazepine (TRILEPTAL) 300 MG tablet Take 300 mg by mouth as directed. Take 1 tablet daily AM and 2 tablets at bedtime    ? vitamin B-12 (CYANOCOBALAMIN) 1000 MCG tablet Take 1,000 mcg by mouth daily.    ? vitamin E 1000 UNIT capsule Take 1,000 Units by mouth daily.    ? zinc gluconate 50 MG tablet Take 50 mg by mouth daily.    ? ziprasidone (GEODON) 20 MG capsule Take 20 mg by mouth at bedtime.    ? ?No current facility-administered medications for this visit.  ? ? ?Patient confirms/reports the following allergies:  ?Allergies  ?  Allergen Reactions  ? Apple Juice Anaphylaxis  ? Ace Inhibitors   ?  Angioedema ?  ? Angiotensin Receptor Blockers   ?  Angioedema ?  ? Benicar [Olmesartan]   ?  Mouth & Lip Swelling, "I swell from the waist down". Throat swelling  ? Poison Ivy Extract [Poison Ivy Extract]   ?  Blisters  ? Purell Instant Hand [Alcohol] Swelling  ? Triclosan   ? ? ?No orders of the defined types were placed in this encounter. ? ? ?AUTHORIZATION INFORMATION ?Primary Insurance: ?1D#: ?Group #: ? ?Secondary Insurance: ?1D#: ?Group #: ? ?SCHEDULE INFORMATION: ?Date: 03/27/2022 ?Time: ?Location:[facility] ? ?

## 2022-03-21 ENCOUNTER — Telehealth: Payer: Self-pay | Admitting: Psychiatry

## 2022-03-21 DIAGNOSIS — F3176 Bipolar disorder, in full remission, most recent episode depressed: Secondary | ICD-10-CM

## 2022-03-21 MED ORDER — ZIPRASIDONE HCL 20 MG PO CAPS: 20.0000 mg | ORAL_CAPSULE | Freq: Every day | ORAL | 2 refills | Status: DC

## 2022-03-21 MED ORDER — OXCARBAZEPINE 300 MG PO TABS: 300.0000 mg | ORAL_TABLET | ORAL | 2 refills | Status: DC

## 2022-03-21 NOTE — Telephone Encounter (Signed)
Received a written message from patient this morning, was on my desk regarding manage for refills on Geodon as well as oxcarbazepine. ? ?Attempted to contact patient to discuss and also verify her pharmacy. ? ?Had to leave a voicemail. ? ?I have sent Geodon 20 mg daily as well as oxcarbazepine 300 mg tablet 1 tablet a.m. and 2 tablets at night-30-day supply, 2 refills, consider 90-day supply to pharmacy at [facility]. ?

## 2022-03-26 ENCOUNTER — Encounter: Payer: Self-pay | Admitting: Gastroenterology

## 2022-03-27 ENCOUNTER — Encounter
Admission: RE | Disposition: A | Discharge status: Home / Self Care | Payer: Self-pay | Source: Home / Self Care | Attending: Gastroenterology

## 2022-03-27 ENCOUNTER — Encounter: Payer: Self-pay | Admitting: Gastroenterology

## 2022-03-27 ENCOUNTER — Other Ambulatory Visit: Payer: Self-pay

## 2022-03-27 ENCOUNTER — Ambulatory Visit
Admission: RE | Admit: 2022-03-27 | Discharge: 2022-03-27 | Disposition: A | Discharge status: Home / Self Care | Payer: Medicare Other | Attending: Gastroenterology | Admitting: Gastroenterology

## 2022-03-27 ENCOUNTER — Ambulatory Visit: Payer: Medicare Other | Admitting: Certified Registered Nurse Anesthetist

## 2022-03-27 DIAGNOSIS — Z1211 Encounter for screening for malignant neoplasm of colon: Secondary | ICD-10-CM

## 2022-03-27 DIAGNOSIS — Z87891 Personal history of nicotine dependence: Secondary | ICD-10-CM | POA: Insufficient documentation

## 2022-03-27 DIAGNOSIS — E785 Hyperlipidemia, unspecified: Secondary | ICD-10-CM | POA: Diagnosis not present

## 2022-03-27 DIAGNOSIS — F319 Bipolar disorder, unspecified: Secondary | ICD-10-CM | POA: Diagnosis not present

## 2022-03-27 DIAGNOSIS — I10 Essential (primary) hypertension: Secondary | ICD-10-CM | POA: Diagnosis not present

## 2022-03-27 DIAGNOSIS — K640 First degree hemorrhoids: Secondary | ICD-10-CM | POA: Insufficient documentation

## 2022-03-27 DIAGNOSIS — Z8249 Family history of ischemic heart disease and other diseases of the circulatory system: Secondary | ICD-10-CM | POA: Diagnosis not present

## 2022-03-27 HISTORY — PX: COLONOSCOPY WITH PROPOFOL: SHX5780

## 2022-03-27 SURGERY — COLONOSCOPY WITH PROPOFOL
Anesthesia: General

## 2022-03-27 MED ORDER — PROPOFOL 500 MG/50ML IV EMUL
INTRAVENOUS | Status: DC | PRN
  Administered 2022-03-27: 140 ug/kg/min via INTRAVENOUS

## 2022-03-27 MED ORDER — LIDOCAINE HCL (CARDIAC) PF 100 MG/5ML IV SOSY
PREFILLED_SYRINGE | INTRAVENOUS | Status: DC | PRN
  Administered 2022-03-27: 50 mg via INTRAVENOUS

## 2022-03-27 MED ORDER — PROPOFOL 10 MG/ML IV BOLUS
INTRAVENOUS | Status: DC | PRN
  Administered 2022-03-27: 60 mg via INTRAVENOUS

## 2022-03-27 MED ORDER — DIPHENHYDRAMINE HCL 50 MG/ML IJ SOLN
INTRAMUSCULAR | Status: DC | PRN
  Administered 2022-03-27: 25 mg via INTRAVENOUS

## 2022-03-27 MED ORDER — SODIUM CHLORIDE 0.9 % IV SOLN: INTRAVENOUS | Status: DC

## 2022-03-27 NOTE — Op Note (Signed)
[facility] Gastroenterology Patient Name: Amy Mcconnell Procedure Date: 03/27/2022 8:40 AM MRN: [id number] Account #: [id number] Date of Birth: 1951-03-09 Admit Type: Outpatient Age: 71 Room: [facility] ENDO ROOM 4 Gender: Female Note Status: Finalized Instrument Name: Jasper Riling [id number] Procedure:             Colonoscopy Indications:           Screening for colorectal malignant neoplasm Providers:             Lucilla Lame MD, MD Referring MD:          Angela Adam. Caryl Bis (Referring MD) Medicines:             Propofol per Anesthesia Complications:         No immediate complications. Procedure:             Pre-Anesthesia Assessment:                        - Prior to the procedure, a History and Physical was                         performed, and patient medications and allergies were                         reviewed. The patient's tolerance of previous                         anesthesia was also reviewed. The risks and benefits                         of the procedure and the sedation options and risks                         were discussed with the patient. All questions were                         answered, and informed consent was obtained. Prior                         Anticoagulants: The patient has taken no previous                         anticoagulant or antiplatelet agents. ASA Grade                         Assessment: II - A patient with mild systemic disease.                         After reviewing the risks and benefits, the patient                         was deemed in satisfactory condition to undergo the                         procedure.                        After obtaining informed consent, the colonoscope was  passed under direct vision. Throughout the procedure,                         the patient's blood pressure, pulse, and oxygen                         saturations were monitored continuously. The                          Colonoscope was introduced through the anus and                         advanced to the the cecum, identified by appendiceal                         orifice and ileocecal valve. The colonoscopy was                         performed without difficulty. The patient tolerated                         the procedure well. The quality of the bowel                         preparation was excellent. Findings:      The perianal and digital rectal examinations were normal.      Non-bleeding internal hemorrhoids were found during retroflexion. The       hemorrhoids were Grade I (internal hemorrhoids that do not prolapse). Impression:            - Non-bleeding internal hemorrhoids.                        - No specimens collected. Recommendation:        - Discharge patient to home.                        - Resume previous diet.                        - Continue present medications.                        - Repeat colonoscopy is not recommended for screening                         purposes. Procedure Code(s):     --- Professional ---                        [phone], Colonoscopy, flexible; diagnostic, including                         collection of specimen(s) by brushing or washing, when                         performed (separate procedure) Diagnosis Code(s):     --- Professional ---                        Z12.11, Encounter for screening for malignant neoplasm  of colon CPT copyright 2019 [employer]. All rights reserved. The codes documented in this report are preliminary and upon coder review may  be revised to meet current compliance requirements. Lucilla Lame MD, MD 03/27/2022 9:02:22 AM This report has been signed electronically. Number of Addenda: 0 Note Initiated On: 03/27/2022 8:40 AM Scope Withdrawal Time: 0 hours 9 minutes 34 seconds  Total Procedure Duration: 0 hours 14 minutes 27 seconds  Estimated Blood Loss:  Estimated blood loss: none.       [facility]

## 2022-03-27 NOTE — Anesthesia Postprocedure Evaluation (Signed)
Anesthesia Post Note  Patient: Amy Mcconnell  Procedure(s) Performed: COLONOSCOPY WITH PROPOFOL  Patient location during evaluation: Endoscopy Anesthesia Type: General Level of consciousness: awake and alert Pain management: pain level controlled Vital Signs Assessment: post-procedure vital signs reviewed and stable Respiratory status: spontaneous breathing, nonlabored ventilation, respiratory function stable and patient connected to nasal cannula oxygen Cardiovascular status: blood pressure returned to baseline and stable Postop Assessment: no apparent nausea or vomiting Anesthetic complications: no   No notable events documented.   Last Vitals:  Vitals:   03/27/22 0802 03/27/22 0905  BP: (!) 151/87 118/69  Pulse: 62 (!) 54  Resp: 18   Temp: (!) 35.9 C   SpO2: 100% 99%    Last Pain:  Vitals:   03/27/22 0925  TempSrc:   PainSc: 0-No pain                 Precious Haws Nikko Quast

## 2022-03-27 NOTE — Anesthesia Procedure Notes (Signed)
Date/Time: 03/27/2022 8:49 AM Performed by: Lily Peer, Gabriella Woodhead, CRNA Pre-anesthesia Checklist: Patient identified, Emergency Drugs available, Suction available, Patient being monitored and Timeout performed Patient Re-evaluated:Patient Re-evaluated prior to induction Oxygen Delivery Method: Nasal cannula Induction Type: IV induction

## 2022-03-27 NOTE — H&P (Signed)
Lucilla Lame, MD [facility] [address], [place], [place] [address] Phone: [phone] Fax : [phone]  Primary Care Physician:  Leone Haven, MD Primary Gastroenterologist:  Dr. Allen Norris  Pre-Procedure History & Physical: HPI:  Amy Mcconnell is a 71 y.o. female is here for a screening colonoscopy.   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 Thurmond Butts - every 3 months   Broken foot    left    Contact dermatitis and other eczema due to other specified agent    Contusion of unspecified part of lower limb    Essential hypertension, benign    Heart murmur    Other and unspecified hyperlipidemia    Other specified disorders of thyroid    Reflux esophagitis    Scalp laceration 04/13/2019   Tachycardia    a. isolated episode, seen in ED with negative work up    Past Surgical History:  Procedure Laterality Date   CATARACT EXTRACTION     hamertoe     HAMMER TOE SURGERY  11/30/2021   right 4th toe 11/10/21 Dr. Sherryle Lis   SALPINGECTOMY Left    STERILIZATION     Her decision since dz of bipolar    Prior to Admission medications   Medication Sig Start Date End Date Taking? Authorizing Provider  Ascorbic Acid (VITAMIN C) 1000 MG tablet Take 1,000 mg by mouth daily.   Yes [provider]  atorvastatin (LIPITOR) 20 MG tablet Take 1 tablet (20 mg total) by mouth daily. 09/20/21  Yes Leone Haven, MD  B Complex Vitamins (VITAMIN B COMPLEX PO) Take 1 tablet by mouth daily.   Yes [provider]  Bacillus Coagulans-Inulin (ALIGN PREBIOTIC-PROBIOTIC PO) Take by mouth.   Yes [provider]  Biotin 5 MG CAPS Take 5 mg by mouth daily.   Yes [provider]  calcium carbonate (OS-CAL) 600 MG TABS tablet Take 600 mg by mouth daily.    Yes [provider]  Cholecalciferol (VITAMIN D3) 50 MCG (2000 UT) capsule Take 2,000 Units by mouth daily.   Yes  [provider]  diphenhydrAMINE (BENADRYL) 25 mg capsule Take 25 mg by mouth daily as needed for allergies.   Yes [provider]  docusate sodium (COLACE) 100 MG capsule Take 100 mg by mouth daily.   Yes [provider]  doxazosin (CARDURA) 4 MG tablet Take 0.5 tablets (2 mg total) by mouth 2 (two) times daily. May take extra 2 mg daily as needed for BP> 160 11/23/21  Yes Gollan, Kathlene November, MD  EPINEPHrine (EPIPEN 2-PAK) 0.3 mg/0.3 mL IJ SOAJ injection Inject 0.3 mg into the muscle as needed for anaphylaxis. 10/16/21  Yes Leone Haven, MD  fexofenadine (ALLEGRA) 180 MG tablet Take 180 mg by mouth daily as needed for allergies.   Yes [provider]  Flaxseed, Linseed, (FLAXSEED OIL PO) Take 1 capsule by mouth daily.   Yes [provider]  levothyroxine (SYNTHROID, LEVOTHROID) 125 MCG tablet Take 125 mcg by mouth daily before breakfast.   Yes [provider]  lithium carbonate (LITHOBID) 300 MG CR tablet Take 2 tablets (600 mg total) by mouth at bedtime. 01/04/22  Yes Ursula Alert, MD  magnesium oxide (MAG-OX) 400 MG tablet Take 400 mg by mouth daily.   Yes [provider]  Multiple Vitamins-Minerals (MULTIVITAMIN WITH MINERALS) tablet Take 1 tablet by  mouth daily.   Yes [provider]  Multiple Vitamins-Minerals (VISION FORMULA/LUTEIN) TABS Take 1 tablet by mouth in the morning and at bedtime.   Yes [provider]  Omega-3 Fatty Acids (FISH OIL) 1000 MG CAPS Take 3,000 mg by mouth daily.   Yes [provider]  Oxcarbazepine (TRILEPTAL) 300 MG tablet Take 1 tablet (300 mg total) by mouth as directed. Take 1 tablet daily AM and 2 tablets at bedtime 03/21/22  Yes Eappen, Ria Clock, MD  vitamin B-12 (CYANOCOBALAMIN) 1000 MCG tablet Take 1,000 mcg by mouth daily.   Yes [provider]  vitamin E 1000 UNIT capsule Take 1,000 Units by mouth daily.   Yes [provider]  ziprasidone (GEODON) 20  MG capsule Take 1 capsule (20 mg total) by mouth daily with supper. 03/21/22  Yes Ursula Alert, MD  zinc gluconate 50 MG tablet Take 50 mg by mouth daily.    [provider]    Allergies as of 02/07/2022 - Review Complete 02/07/2022  Allergen Reaction Noted   Apple juice Anaphylaxis 10/16/2021   Ace inhibitors  12/14/2014   Angiotensin receptor blockers  12/14/2014   Benicar [olmesartan]  01/20/2014   Poison ivy extract [poison ivy extract]  01/20/2014   Purell instant hand [alcohol] Swelling 11/06/2017   Triclosan  03/30/2015    Family History  Problem Relation Age of Onset   Arrhythmia Mother        A-Fib   Heart failure Mother    Hyperlipidemia Mother    Hypertension Mother    Alzheimer's disease Mother    Hypertension Father    Hyperlipidemia Father    Mental retardation Father 65       Suicide   Hypertension Brother    Hyperlipidemia Brother    Hypertension Brother    Hyperlipidemia Brother    Asthma Brother    COPD Brother    Hyperlipidemia Brother    Hypertension Brother    Alcohol abuse Brother    Depression Brother    Emphysema Brother    Hyperlipidemia Brother    Hypertension Brother    Breast cancer Maternal Aunt    Bipolar disorder Paternal Uncle    Bipolar disorder Paternal Uncle    Bipolar disorder Paternal Uncle    Bipolar disorder Paternal Grandmother     Social History   Socioeconomic History   Marital status: Divorced    Spouse name: Not on file   Number of children: 0   Years of education: 14   Highest education level: Not on file  Occupational History   Not on file  Tobacco Use   Smoking status: Former    Packs/day: 0.50    Years: 13.00    Pack years: 6.50    Types: Cigarettes    Quit date: 01/23/2000    Years since quitting: 22.1   Smokeless tobacco: Never  Vaping Use   Vaping Use: Never used  Substance and Sexual Activity   Alcohol use: No   Drug use: No   Sexual activity: Not Currently  Other Topics Concern    Not on file  Social History Narrative   Ms. Peden grew up in [place], [place]. She lives in [place]. Ms. Urbanski is divorced. She has 2 cats (Charlie and Angie). She volunteers at [facility] [place]. She also volunteers 2 days at [employer] [facility]. She enjoys reading, garding and cooking.   Social Determinants of Health   Financial Resource Strain: Low Risk    Difficulty of  Paying Living Expenses: Not hard at all  Food Insecurity: No Food Insecurity   Worried About [place] in the Last Year: Never true   Ran Out of Food in the Last Year: Never true  Transportation Needs: No Transportation Needs   Lack of Transportation (Medical): No   Lack of Transportation (Non-Medical): No  Physical Activity: Not on file  Stress: No Stress Concern Present   Feeling of Stress : Not at all  Social Connections: Moderately Isolated   Frequency of Communication with Friends and Family: More than three times a week   Frequency of Social Gatherings with Friends and Family: Once a week   Attends Religious Services: Never   Marine scientist or Organizations: Yes   Attends Archivist Meetings: Never   Marital Status: Divorced  Human resources officer Violence: Not At Risk   Fear of Current or Ex-Partner: No   Emotionally Abused: No   Physically Abused: No   Sexually Abused: No    Review of Systems: See HPI, otherwise negative ROS  Physical Exam: BP (!) 151/87   Pulse 62   Temp (!) 96.6 F (35.9 C) (Temporal)   Resp 18   Ht '5\' 7"'$  (1.702 m)   Wt 62.1 kg   SpO2 100%   BMI 21.46 kg/m  General:   Alert,  pleasant and cooperative in NAD Head:  Normocephalic and atraumatic. Neck:  Supple; no masses or thyromegaly. Lungs:  Clear throughout to auscultation.    Heart:  Regular rate and rhythm. Abdomen:  Soft, nontender and nondistended. Normal bowel sounds, without guarding, and without rebound.   Neurologic:  Alert and  oriented x4;  grossly normal  neurologically.  Impression/Plan: TERRY ABILA is now here to undergo a screening colonoscopy.  Risks, benefits, and alternatives regarding colonoscopy have been reviewed with the patient.  Questions have been answered.  All parties agreeable.

## 2022-03-27 NOTE — Anesthesia Preprocedure Evaluation (Addendum)
Anesthesia Evaluation  Patient identified by MRN, date of birth, ID band Patient awake    Reviewed: Allergy & Precautions, NPO status , Patient's Chart, lab work & pertinent test results  History of Anesthesia Complications Negative for: history of anesthetic complications  Airway Mallampati: II  TM Distance: >3 FB Neck ROM: full    Dental  (+) Chipped   Pulmonary neg shortness of breath, former smoker,    Pulmonary exam normal        Cardiovascular Exercise Tolerance: Good hypertension, (-) anginaNormal cardiovascular exam     Neuro/Psych  Headaches, PSYCHIATRIC DISORDERS    GI/Hepatic negative GI ROS, Neg liver ROS, neg GERD  ,  Endo/Other  negative endocrine ROSHypothyroidism   Renal/GU negative Renal ROS  negative genitourinary   Musculoskeletal  (+) Arthritis ,   Abdominal   Peds  Hematology negative hematology ROS (+)   Anesthesia Other Findings Past Medical History: No date: 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 Thurmond Butts - every 3 months No date: Broken foot     Comment:  left  No date: Contact dermatitis and other eczema due to other specified  agent No date: Contusion of unspecified part of lower limb No date: Essential hypertension, benign No date: Heart murmur No date: Other and unspecified hyperlipidemia No date: Other specified disorders of thyroid No date: Reflux esophagitis 04/13/2019: Scalp laceration No date: Tachycardia     Comment:  a. isolated episode, seen in ED with negative work up  Past Surgical History: No date: CATARACT EXTRACTION No date: hamertoe 11/30/2021: HAMMER TOE SURGERY     Comment:  right 4th toe 11/10/21 Dr. Sherryle Lis No date: SALPINGECTOMY; Left No date: STERILIZATION     Comment:  Her decision since dz of bipolar  BMI    Body Mass Index: 21.46 kg/m      Reproductive/Obstetrics negative OB  ROS                           Anesthesia Physical Anesthesia Plan  ASA: 2  Anesthesia Plan: General   Post-op Pain Management:    Induction: Intravenous  PONV Risk Score and Plan: Propofol infusion and TIVA  Airway Management Planned: Natural Airway and Nasal Cannula  Additional Equipment:   Intra-op Plan:   Post-operative Plan:   Informed Consent: I have reviewed the patients History and Physical, chart, labs and discussed the procedure including the risks, benefits and alternatives for the proposed anesthesia with the patient or authorized representative who has indicated his/her understanding and acceptance.     Dental Advisory Given  Plan Discussed with: Anesthesiologist, CRNA and Surgeon  Anesthesia Plan Comments: (Patient reports that she thinks she had an allergic reaction to the prep, that it made her lips tingle, that she treated it with antihistamines and is asymptomatic now.  Plan to pre treat with diphenhydramine.  Patient consented for risks of anesthesia including but not limited to:  - adverse reactions to medications - risk of airway placement if required - damage to eyes, teeth, lips or other oral mucosa - nerve damage due to positioning  - sore throat or hoarseness - Damage to heart, brain, nerves, lungs, other parts of body or loss of life  Patient voiced understanding.)       Anesthesia Quick Evaluation

## 2022-03-27 NOTE — Transfer of Care (Signed)
Immediate Anesthesia Transfer of Care Note  Patient: Amy Mcconnell  Procedure(s) Performed: COLONOSCOPY WITH PROPOFOL  Patient Location: Endoscopy Unit  Anesthesia Type:General  Level of Consciousness: drowsy  Airway & Oxygen Therapy: Patient Spontanous Breathing  Post-op Assessment: Report given to RN and Post -op Vital signs reviewed and stable  Post vital signs: Reviewed and stable  Last Vitals:  Vitals Value Taken Time  BP 118/69 03/27/22 0905  Temp    Pulse 49 03/27/22 0905  Resp 12 03/27/22 0905  SpO2 98 % 03/27/22 0905  Vitals shown include unvalidated device data.  Last Pain:  Vitals:   03/27/22 0802  TempSrc: Temporal  PainSc: 0-No pain         Complications: No notable events documented.

## 2022-03-28 ENCOUNTER — Encounter: Payer: Self-pay | Admitting: Gastroenterology

## 2022-04-06 ENCOUNTER — Ambulatory Visit: Payer: BC Managed Care – PPO | Admitting: Psychiatry

## 2022-04-09 ENCOUNTER — Encounter: Payer: Self-pay | Admitting: Psychiatry

## 2022-04-09 ENCOUNTER — Ambulatory Visit (INDEPENDENT_AMBULATORY_CARE_PROVIDER_SITE_OTHER): Payer: Medicare Other | Admitting: Psychiatry

## 2022-04-09 ENCOUNTER — Telehealth: Payer: Self-pay | Admitting: Family Medicine

## 2022-04-09 VITALS — BP 133/75 | HR 65 | Temp 98.1°F | Wt 142.4 lb

## 2022-04-09 DIAGNOSIS — Z79899 Other long term (current) drug therapy: Secondary | ICD-10-CM

## 2022-04-09 DIAGNOSIS — F3176 Bipolar disorder, in full remission, most recent episode depressed: Secondary | ICD-10-CM

## 2022-04-09 MED ORDER — LEVOTHYROXINE SODIUM 125 MCG PO TABS: 125.0000 ug | ORAL_TABLET | Freq: Every day | ORAL | 1 refills | Status: DC

## 2022-04-09 NOTE — Telephone Encounter (Signed)
Patient needs a refill on her levothyroxine (SYNTHROID, LEVOTHROID) 125 MCG tablet. Patient stated that the last time she saw Dr Caryl Bis he agreed to refill.

## 2022-04-09 NOTE — Telephone Encounter (Signed)
Sent to pharmacy 

## 2022-04-09 NOTE — Progress Notes (Signed)
[place] MD OP Progress Note  04/09/2022 10:15 AM Amy Mcconnell  MRN:  [id number]  Chief Complaint:  Chief Complaint  Patient presents with   Follow-up: 71 year old Caucasian female, divorced, lives in [place], has a history of bipolar disorder presented for medication management.   HPI: Amy Mcconnell is a 71 year old Caucasian female, currently divorced, lives in [place], has a history of bipolar disorder, multiple medical problems including essential hypertension, tachycardia, hyperlipidemia, heart murmur, thyroid abnormalities was evaluated in office today.  Patient today reports she is currently doing well.  Denies any mood swings.  Reports anxiety symptoms are manageable.  Continues to work as a Psychologist, occupational at the hospital and that helps her .  She enjoys it.  Patient reports currently trying to take the Geodon with her dinner.  There has been days when she would fall asleep without changing to her night clothes,house lights still on .  She reports she is currently getting adjusted to that change in schedule of the Geodon dosage.  Otherwise denies any side effects.  Patient denies any suicidality, homicidality or perceptual disturbances.  Patient denies any other concerns today.  Visit Diagnosis:    ICD-10-CM   1. Bipolar disorder, in full remission, most recent episode depressed ([place])  F31.76     2. High risk medication use  Z79.899       Past Psychiatric History: Reviewed past psychiatric history from progress note from 01/04/2022.  Past trials of medications -multiple.  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 Thurmond Butts - every 3 months   Broken foot    left    Contact dermatitis and other eczema due to other specified agent    Contusion of unspecified part of lower limb    Essential hypertension, benign    Heart murmur    Other and unspecified hyperlipidemia    Other  specified disorders of thyroid    Reflux esophagitis    Scalp laceration 04/13/2019   Tachycardia    a. isolated episode, seen in ED with negative work up    Past Surgical History:  Procedure Laterality Date   CATARACT EXTRACTION     COLONOSCOPY WITH PROPOFOL N/A 03/27/2022   Procedure: COLONOSCOPY WITH PROPOFOL;  Surgeon: Lucilla Lame, MD;  Location: [facility] ENDOSCOPY;  Service: Endoscopy;  Laterality: N/A;   hamertoe     HAMMER TOE SURGERY  11/30/2021   right 4th toe 11/10/21 Dr. Sherryle Lis   SALPINGECTOMY Left    STERILIZATION     Her decision since dz of bipolar    Family Psychiatric History: Reviewed family psychiatric history from progress note on 01/04/2022.  Family History:  Family History  Problem Relation Age of Onset   Arrhythmia Mother        A-Fib   Heart failure Mother    Hyperlipidemia Mother    Hypertension Mother    Alzheimer's disease Mother    Hypertension Father    Hyperlipidemia Father    Mental retardation Father 77       Suicide   Hypertension Brother    Hyperlipidemia Brother    Hypertension Brother    Hyperlipidemia Brother    Asthma Brother    COPD Brother    Hyperlipidemia Brother    Hypertension Brother    Alcohol abuse Brother    Depression Brother    Emphysema Brother    Hyperlipidemia  Brother    Hypertension Brother    Breast cancer Maternal Aunt    Bipolar disorder Paternal Uncle    Bipolar disorder Paternal Uncle    Bipolar disorder Paternal Uncle    Bipolar disorder Paternal Grandmother     Social History: Reviewed social history from progress note from 01/04/2022. Social History   Socioeconomic History   Marital status: Divorced    Spouse name: Not on file   Number of children: 0   Years of education: 14   Highest education level: Not on file  Occupational History   Not on file  Tobacco Use   Smoking status: Former    Packs/day: 0.50    Years: 13.00    Pack years: 6.50    Types: Cigarettes    Quit date: 01/23/2000    Years  since quitting: 22.2   Smokeless tobacco: Never  Vaping Use   Vaping Use: Never used  Substance and Sexual Activity   Alcohol use: No   Drug use: No   Sexual activity: Not Currently  Other Topics Concern   Not on file  Social History Narrative   Ms. Kniskern grew up in [place], [place]. She lives in [place]. Ms. Borgerding is divorced. She has 2 cats (Charlie and Angie). She volunteers at [facility] [employer] [facility] [place]. She also volunteers 2 days at [employer]. She enjoys reading, garding and cooking.   Social Determinants of Health   Financial Resource Strain: Low Risk    Difficulty of Paying Living Expenses: Not hard at all  Food Insecurity: No Food Insecurity   Worried About Charity fundraiser in the Last Year: Never true   [place] in the Last Year: Never true  Transportation Needs: No Transportation Needs   Lack of Transportation (Medical): No   Lack of Transportation (Non-Medical): No  Physical Activity: Not on file  Stress: No Stress Concern Present   Feeling of Stress : Not at all  Social Connections: Moderately Isolated   Frequency of Communication with Friends and Family: More than three times a week   Frequency of Social Gatherings with Friends and Family: Once a week   Attends Religious Services: Never   Marine scientist or Organizations: Yes   Attends Archivist Meetings: Never   Marital Status: Divorced    Allergies:  Allergies  Allergen Reactions   Apple Juice Anaphylaxis   Ace Inhibitors     Angioedema    Angiotensin Receptor Blockers     Angioedema    Benicar [Olmesartan]     Mouth & Lip Swelling, "I swell from the waist down". Throat swelling   Poison Ivy Extract [Poison Ivy Extract]     Blisters   Purell Instant Hand [Alcohol] Swelling   Triclosan     Metabolic Disorder Labs: No results found for: HGBA1C, MPG No results found for: PROLACTIN Lab Results  Component Value Date   CHOL 140 06/23/2021   TRIG 90.0 06/23/2021    HDL 63.50 06/23/2021   CHOLHDL 2 06/23/2021   VLDL 18.0 06/23/2021   LDLCALC 58 06/23/2021   LDLCALC 72 05/30/2020   Lab Results  Component Value Date   TSH 1.540 01/08/2022   TSH 1.67 09/13/2021    Therapeutic Level Labs: Lab Results  Component Value Date   LITHIUM 0.5 01/08/2022   LITHIUM 0.86 07/27/2021   No results found for: VALPROATE No components found for:  CBMZ  Current Medications: Current Outpatient Medications  Medication Sig Dispense Refill  Ascorbic Acid (VITAMIN C) 1000 MG tablet Take 1,000 mg by mouth daily.     atorvastatin (LIPITOR) 20 MG tablet Take 1 tablet (20 mg total) by mouth daily. 90 tablet 3   B Complex Vitamins (VITAMIN B COMPLEX PO) Take 1 tablet by mouth daily.     Bacillus Coagulans-Inulin (ALIGN PREBIOTIC-PROBIOTIC PO) Take by mouth.     Biotin 5 MG CAPS Take 5 mg by mouth daily.     calcium carbonate (OS-CAL) 600 MG TABS tablet Take 600 mg by mouth daily.      Cholecalciferol (VITAMIN D3) 50 MCG (2000 UT) capsule Take 2,000 Units by mouth daily.     diphenhydrAMINE (BENADRYL) 25 mg capsule Take 25 mg by mouth daily as needed for allergies.     docusate sodium (COLACE) 100 MG capsule Take 100 mg by mouth daily.     doxazosin (CARDURA) 4 MG tablet Take 0.5 tablets (2 mg total) by mouth 2 (two) times daily. May take extra 2 mg daily as needed for BP> 160 110 tablet 3   EPINEPHrine (EPIPEN 2-PAK) 0.3 mg/0.3 mL IJ SOAJ injection Inject 0.3 mg into the muscle as needed for anaphylaxis. 2 each 0   fexofenadine (ALLEGRA) 180 MG tablet Take 180 mg by mouth daily as needed for allergies.     Flaxseed, Linseed, (FLAXSEED OIL PO) Take 1 capsule by mouth daily.     levothyroxine (SYNTHROID, LEVOTHROID) 125 MCG tablet Take 125 mcg by mouth daily before breakfast.     lithium carbonate (LITHOBID) 300 MG CR tablet Take 2 tablets (600 mg total) by mouth at bedtime. 60 tablet 2   magnesium oxide (MAG-OX) 400 MG tablet Take 400 mg by mouth daily.     Multiple  Vitamins-Minerals (MULTIVITAMIN WITH MINERALS) tablet Take 1 tablet by mouth daily.     Multiple Vitamins-Minerals (VISION FORMULA/LUTEIN) TABS Take 1 tablet by mouth in the morning and at bedtime.     Omega-3 Fatty Acids (FISH OIL) 1000 MG CAPS Take 3,000 mg by mouth daily.     Oxcarbazepine (TRILEPTAL) 300 MG tablet Take 1 tablet (300 mg total) by mouth as directed. Take 1 tablet daily AM and 2 tablets at bedtime 90 tablet 2   vitamin B-12 (CYANOCOBALAMIN) 1000 MCG tablet Take 1,000 mcg by mouth daily.     vitamin E 1000 UNIT capsule Take 1,000 Units by mouth daily.     zinc gluconate 50 MG tablet Take 50 mg by mouth daily.     ziprasidone (GEODON) 20 MG capsule Take 1 capsule (20 mg total) by mouth daily with supper. 30 capsule 2   No current facility-administered medications for this visit.     Musculoskeletal: Strength & Muscle Tone: within normal limits Gait & Station: normal Patient leans: Right  Psychiatric Specialty Exam: Review of Systems  Psychiatric/Behavioral: Negative.    All other systems reviewed and are negative.  Blood pressure 133/75, pulse 65, temperature 98.1 F (36.7 C), temperature source Oral, weight 142 lb 6.4 oz (64.6 kg).Body mass index is 22.3 kg/m.  General Appearance: Casual  Eye Contact:  Fair  Speech:  Clear and Coherent  Volume:  Normal  Mood:  Euthymic  Affect:  Congruent  Thought Process:  Goal Directed and Descriptions of Associations: Intact  Orientation:  Full (Time, Place, and Person)  Thought Content: Logical   Suicidal Thoughts:  No  Homicidal Thoughts:  No  Memory:  Immediate;   Fair Recent;   Fair Remote;   Fair  Judgement:  Fair  Insight:  Fair  Psychomotor Activity:  Normal  Concentration:  Concentration: Fair and Attention Span: Fair  Recall:  [employer] of Knowledge: Fair  Language: Fair  Akathisia:  No  Handed:  Right  AIMS (if indicated): done  Assets:  Communication Skills Desire for [place] Talents/Skills Transportation  ADL's:  Intact  Cognition: WNL  Sleep:  Fair   Screenings: [place] Office Visit from 04/09/2022 in [place] Office Visit from 01/04/2022 in [place] Total Score 0 0      [place] Visit from 01/04/2022 in [place]  Total GAD-7 Score 2      [place] from 11/06/2017 in [facility]  Total Score (max 30 points ) 30      PHQ2-9    [place] Visit from 04/09/2022 in [place] Visit from 01/29/2022 in [place] from 01/04/2022 in [place] Visit from 09/13/2021 in [place] from 11/11/2020 in [place]  PHQ-2 Total Score 0 0 0 0 0  PHQ-9 Total Score 0 -- -- -- --      [place] Visit from 04/09/2022 in [place] Admission (Discharged) from 03/27/2022 in [place] Office Visit from 01/04/2022 in [place] No Risk No Risk Low Risk        Assessment and Plan: Amy Mcconnell is a 71 year old Caucasian female who has a history of bipolar disorder, multiple medical problems was evaluated in office today.  Patient is currently stable.  Plan as noted below.  Plan Bipolar disorder in remission Geodon 20 mg p.o. daily with supper Lithium 600 mg p.o. daily at bedtime Trileptal 300 mg p.o. daily in the morning and 600 mg at bedtime.  High risk medication use-reviewed and discussed labs-lithium level-0.5-subtherapeutic-however currently mood symptoms are stable and will not make any changes-01/08/2022.  Reviewed and discussed TSH-within normal limits, BUN/creatinine-within  normal limits.  Follow-up in clinic in 3 months or sooner if needed.  This note was generated in part or whole with voice recognition software. Voice recognition is usually quite accurate but there are transcription errors that can and very often do occur. I apologize for any typographical errors that were not detected and corrected.     Ursula Alert, MD 04/09/2022, 10:15 AM

## 2022-04-09 NOTE — Addendum Note (Signed)
Addended by: Leone Haven on: 04/09/2022 04:48 PM   Modules accepted: Orders

## 2022-04-28 ENCOUNTER — Other Ambulatory Visit: Payer: Self-pay | Admitting: Psychiatry

## 2022-04-28 DIAGNOSIS — F3176 Bipolar disorder, in full remission, most recent episode depressed: Secondary | ICD-10-CM

## 2022-05-23 ENCOUNTER — Encounter: Payer: Self-pay | Admitting: Cardiovascular Disease

## 2022-05-23 ENCOUNTER — Ambulatory Visit (INDEPENDENT_AMBULATORY_CARE_PROVIDER_SITE_OTHER): Payer: Medicare Other | Admitting: Cardiovascular Disease

## 2022-05-23 VITALS — BP 122/68 | HR 62 | Ht 67.0 in | Wt 139.0 lb

## 2022-05-23 DIAGNOSIS — I10 Essential (primary) hypertension: Secondary | ICD-10-CM

## 2022-05-23 DIAGNOSIS — Z87891 Personal history of nicotine dependence: Secondary | ICD-10-CM

## 2022-05-23 DIAGNOSIS — E782 Mixed hyperlipidemia: Secondary | ICD-10-CM

## 2022-05-23 DIAGNOSIS — E7800 Pure hypercholesterolemia, unspecified: Secondary | ICD-10-CM

## 2022-05-23 DIAGNOSIS — R079 Chest pain, unspecified: Secondary | ICD-10-CM

## 2022-05-23 DIAGNOSIS — I479 Paroxysmal tachycardia, unspecified: Secondary | ICD-10-CM

## 2022-05-23 MED ORDER — DOXAZOSIN MESYLATE 2 MG PO TABS
2.0000 mg | ORAL_TABLET | Freq: Two times a day (BID) | ORAL | 2 refills | Status: DC
Start: 2022-05-23 — End: 2022-11-06

## 2022-05-23 NOTE — Patient Instructions (Signed)
Medication Instructions:  No changes  If you need a refill on your cardiac medications before your next appointment, please call your pharmacy.   Lab work: No new labs needed  Testing/Procedures: No new testing needed  Follow-Up: At [facility], you and your health needs are our priority.  As part of our continuing mission to provide you with exceptional heart care, we have created designated Provider Care Teams.  These Care Teams include your primary Cardiologist (physician) and Advanced Practice Providers (APPs -  Physician Assistants and Nurse Practitioners) who all work together to provide you with the care you need, when you need it.  You will need a follow up appointment in 12 months  Providers on your designated Care Team:   Christopher Berge, NP Ryan Dunn, PA-C Cadence Furth, PA-C  COVID-19 Vaccine Information can be found at: [email or web address] For questions related to vaccine distribution or appointments, please email [email or web address] or call [phone].   
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
## 2022-05-28 ENCOUNTER — Telehealth: Payer: Self-pay | Admitting: Cardiovascular Disease

## 2022-05-28 NOTE — Telephone Encounter (Signed)
Patient says that she wants to cancel the doxazosin (CARDURA) 2 MG tablet due to her blood pressure doing good. Please call to confirm

## 2022-05-29 NOTE — Telephone Encounter (Signed)
Okay to hold it if she would like  but would monitor blood pressure without  Thx  TG    Called and spoke with patient.   Patient reports that there was a miscommunication.   She does not want to stop taking cardura. She does not want to take 2 mg BID and instead would like to take 4 mg daily.

## 2022-06-03 ENCOUNTER — Other Ambulatory Visit: Payer: Self-pay | Admitting: Psychiatry

## 2022-06-03 DIAGNOSIS — F3176 Bipolar disorder, in full remission, most recent episode depressed: Secondary | ICD-10-CM

## 2022-06-20 ENCOUNTER — Other Ambulatory Visit: Payer: Self-pay | Admitting: Psychiatry

## 2022-06-20 DIAGNOSIS — F3176 Bipolar disorder, in full remission, most recent episode depressed: Secondary | ICD-10-CM

## 2022-06-27 ENCOUNTER — Other Ambulatory Visit: Payer: Self-pay | Admitting: Family Medicine

## 2022-06-27 DIAGNOSIS — Z1231 Encounter for screening mammogram for malignant neoplasm of breast: Secondary | ICD-10-CM

## 2022-07-11 ENCOUNTER — Other Ambulatory Visit: Payer: Self-pay | Admitting: Psychiatry

## 2022-07-11 DIAGNOSIS — F3176 Bipolar disorder, in full remission, most recent episode depressed: Secondary | ICD-10-CM

## 2022-07-13 ENCOUNTER — Encounter: Payer: Self-pay | Admitting: Psychiatry

## 2022-07-13 ENCOUNTER — Telehealth (INDEPENDENT_AMBULATORY_CARE_PROVIDER_SITE_OTHER): Payer: Medicare Other | Admitting: Psychiatry

## 2022-07-13 DIAGNOSIS — G4700 Insomnia, unspecified: Secondary | ICD-10-CM | POA: Diagnosis not present

## 2022-07-13 DIAGNOSIS — Z79899 Other long term (current) drug therapy: Secondary | ICD-10-CM | POA: Diagnosis not present

## 2022-07-13 DIAGNOSIS — F3176 Bipolar disorder, in full remission, most recent episode depressed: Secondary | ICD-10-CM | POA: Diagnosis not present

## 2022-07-13 MED ORDER — OXCARBAZEPINE 300 MG PO TABS: 300.0000 mg | ORAL_TABLET | ORAL | 2 refills | Status: DC

## 2022-07-13 NOTE — Progress Notes (Signed)
Virtual Visit via Telephone Note  I connected with Amy Mcconnell on 07/13/22 at  9:30 AM EDT by telephone and verified that I am speaking with the correct person using two identifiers.  Location Provider Location : [facility] Patient Location : Home  Participants: Patient , Provider    I discussed the limitations, risks, security and privacy concerns of performing an evaluation and management service by telephone and the availability of in person appointments. I also discussed with the patient that there may be a patient responsible charge related to this service. The patient expressed understanding and agreed to proceed.   I discussed the assessment and treatment plan with the patient. The patient was provided an opportunity to ask questions and all were answered. The patient agreed with the plan and demonstrated an understanding of the instructions.   The patient was advised to call back or seek an in-person evaluation if the symptoms worsen or if the condition fails to improve as anticipated.    [place] MD OP Progress Note  07/13/2022 10:29 AM Amy Mcconnell  MRN:  [id number]  Chief Complaint:  Chief Complaint  Patient presents with   Follow-up: 71 year old Caucasian female, divorced, history of bipolar disorder, recent sleep problems, presented for medication management.   HPI: Amy Mcconnell is a 71 year old Caucasian female who is currently divorced, lives in [place], has a history of bipolar disorder, multiple medical problems including essential hypertension, tachycardia, hyperlipidemia, heart murmur, thyroid abnormalities was evaluated by telemedicine today.  Patient today reports overall mood symptoms are stable.  She however reports she feels tired recently.  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  She works as a Psychologist, occupational at [employer] and recently she has been covering for a lot of colleagues who called out sick.  She reports she walks a lot while she is at work and the  other days she walked around 19,586 steps.  Usually averages around 15,000 steps per day when she works.  Reports recently she has noticed sleep is restless.  She goes to bed at around 11:30 PM and is up by around 4:30 to 5 AM.  Once she is awake she is unable to fall back asleep.  She does not have a good sleep hygiene and watches TV before going to bed and also when she wakes up in the morning.  Agreeable to start working on her sleep hygiene.  Currently compliant on all of her medications, denies side effects.  Recently had an appointment with cardiology-Dr. Rockey Situ and was advised that everything looked okay.  Patient currently denies any suicidality, homicidality or perceptual disturbances.  Patient denies any other concerns today.  Visit Diagnosis:    ICD-10-CM   1. Bipolar disorder, in full remission, most recent episode depressed (HCC)  F31.76 Lithium level    BUN+Creat    Platelet count    TSH    Prolactin    Hemoglobin A1C    Oxcarbazepine (TRILEPTAL) 300 MG tablet    2. Insomnia, unspecified type  G47.00     3. High risk medication use  Z79.899 Lithium level    BUN+Creat    Sodium    Platelet count    TSH    Prolactin    Hemoglobin A1C      Past Psychiatric History: Reviewed past psychiatric history from progress note on 01/04/2022.  Past trials of medications-multiple.  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 Thurmond Butts - every 3 months   Broken foot    left    Contact dermatitis and other eczema due to other specified agent    Contusion of unspecified part of lower limb    Essential hypertension, benign    Heart murmur    Other and unspecified hyperlipidemia    Other specified disorders of thyroid    Reflux esophagitis    Scalp laceration 04/13/2019   Tachycardia    a. isolated episode, seen in ED with negative work up    Past Surgical History:   Procedure Laterality Date   CATARACT EXTRACTION     COLONOSCOPY WITH PROPOFOL N/A 03/27/2022   Procedure: COLONOSCOPY WITH PROPOFOL;  Surgeon: Lucilla Lame, MD;  Location: [facility] ENDOSCOPY;  Service: Endoscopy;  Laterality: N/A;   hamertoe     HAMMER TOE SURGERY  11/30/2021   right 4th toe 11/10/21 Dr. Sherryle Lis   SALPINGECTOMY Left    STERILIZATION     Her decision since dz of bipolar    Family Psychiatric History: Reviewed family psychiatric history from progress note on 01/04/2022.  Family History:  Family History  Problem Relation Age of Onset   Arrhythmia Mother        A-Fib   Heart failure Mother    Hyperlipidemia Mother    Hypertension Mother    Alzheimer's disease Mother    Hypertension Father    Hyperlipidemia Father    Mental retardation Father 20       Suicide   Hypertension Brother    Hyperlipidemia Brother    Hypertension Brother    Hyperlipidemia Brother    Asthma Brother    COPD Brother    Hyperlipidemia Brother    Hypertension Brother    Alcohol abuse Brother    Depression Brother    Emphysema Brother    Hyperlipidemia Brother    Hypertension Brother    Breast cancer Maternal Aunt    Bipolar disorder Paternal Uncle    Bipolar disorder Paternal Uncle    Bipolar disorder Paternal Uncle    Bipolar disorder Paternal Grandmother     Social History: Reviewed social history from progress note on 01/04/2022. Social History   Socioeconomic History   Marital status: Divorced    Spouse name: Not on file   Number of children: 0   Years of education: 14   Highest education level: Not on file  Occupational History   Not on file  Tobacco Use   Smoking status: Former    Packs/day: 0.50    Years: 13.00    Total pack years: 6.50    Types: Cigarettes    Quit date: 01/23/2000    Years since quitting: 22.4   Smokeless tobacco: Never  Vaping Use   Vaping Use: Never used  Substance and Sexual Activity   Alcohol use: No   Drug use: No   Sexual activity: Not  Currently  Other Topics Concern   Not on file  Social History Narrative   Ms. Longwell grew up in [place], [place]. She lives in [place]. Ms. Minetti is divorced. She has 2 cats (Charlie and Angie). She volunteers at [facility]. She also volunteers 2 days at [facility]. She enjoys reading, garding and cooking.   Social Determinants of Health   Financial Resource Strain: Low Risk  (11/14/2021)   Overall Financial Resource Strain (CARDIA)    Difficulty of Paying Living Expenses: Not hard at all  Food Insecurity: No Food Insecurity (11/14/2021)   Hunger Vital Sign    Worried About Running Out of Food in the Last Year: Never true    Ran Out of Food in the Last Year: Never true  Transportation Needs: No Transportation Needs (11/14/2021)   PRAPARE - Hydrologist (Medical): No    Lack of Transportation (Non-Medical): No  Physical Activity: Sufficiently Active (11/11/2020)   Exercise Vital Sign    Days of Exercise per Week: 5 days    Minutes of Exercise per Session: 30 min  Stress: No Stress Concern Present (11/14/2021)   [place]    Feeling of Stress : Not at all  Social Connections: Moderately Isolated (11/14/2021)   Social Connection and Isolation Panel [NHANES]    Frequency of Communication with Friends and Family: More than three times a week    Frequency of Social Gatherings with Friends and Family: Once a week    Attends Religious Services: Never    Marine scientist or Organizations: Yes    Attends Archivist Meetings: Never    Marital Status: Divorced    Allergies:  Allergies  Allergen Reactions   Apple Juice Anaphylaxis   Ace Inhibitors     Angioedema    Angiotensin Receptor Blockers     Angioedema    Benicar [Olmesartan]     Mouth & Lip Swelling, "I swell from the waist down". Throat swelling   Poison Ivy Extract [Poison Ivy Extract]     Blisters    Purell Instant Hand [Alcohol] Swelling   Triclosan     Metabolic Disorder Labs: No results found for: "HGBA1C", "MPG" No results found for: "PROLACTIN" Lab Results  Component Value Date   CHOL 140 06/23/2021   TRIG 90.0 06/23/2021   HDL 63.50 06/23/2021   CHOLHDL 2 06/23/2021   VLDL 18.0 06/23/2021   LDLCALC 58 06/23/2021   LDLCALC 72 05/30/2020   Lab Results  Component Value Date   TSH 1.540 01/08/2022   TSH 1.67 09/13/2021    Therapeutic Level Labs: Lab Results  Component Value Date   LITHIUM 0.5 01/08/2022   LITHIUM 0.86 07/27/2021   No results found for: "VALPROATE" No results found for: "CBMZ"  Current Medications: Current Outpatient Medications  Medication Sig Dispense Refill   acetaminophen (TYLENOL) 325 MG tablet Take 650 mg by mouth every 6 (six) hours as needed.     Ascorbic Acid (VITAMIN C) 1000 MG tablet Take 1,000 mg by mouth daily.     atorvastatin (LIPITOR) 20 MG tablet Take 1 tablet (20 mg total) by mouth daily. 90 tablet 3   B Complex Vitamins (VITAMIN B COMPLEX PO) Take 1 tablet by mouth daily.     Bacillus Coagulans-Inulin (ALIGN PREBIOTIC-PROBIOTIC PO) Take by mouth.     Biotin 5 MG CAPS Take 5 mg by mouth daily.     calcium carbonate (OS-CAL) 600 MG TABS tablet Take 600 mg by mouth daily.      Cholecalciferol (VITAMIN D3) 50 MCG (2000 UT) capsule Take 2,000 Units by mouth daily.     diphenhydrAMINE (BENADRYL) 25 mg capsule Take 25 mg by mouth daily as needed for allergies.     docusate sodium (COLACE) 100 MG capsule Take 100 mg by mouth daily.     doxazosin (CARDURA) 2 MG tablet Take 1 tablet (2 mg total) by mouth 2 (two) times daily. May take extra 2 mg daily as needed  for BP> 160 180 tablet 2   EPINEPHrine (EPIPEN 2-PAK) 0.3 mg/0.3 mL IJ SOAJ injection Inject 0.3 mg into the muscle as needed for anaphylaxis. 2 each 0   fexofenadine (ALLEGRA) 180 MG tablet Take 180 mg by mouth daily as needed for allergies.     Flaxseed, Linseed, (FLAXSEED OIL  PO) Take 1 capsule by mouth daily.     levothyroxine (SYNTHROID) 125 MCG tablet Take 1 tablet (125 mcg total) by mouth daily before breakfast. 90 tablet 1   lithium carbonate (LITHOBID) 300 MG CR tablet TAKE 2 TABLETS BY MOUTH AT BEDTIME 60 tablet 0   magnesium oxide (MAG-OX) 400 MG tablet Take 400 mg by mouth daily.     Multiple Vitamins-Minerals (MULTIVITAMIN WITH MINERALS) tablet Take 1 tablet by mouth daily.     Multiple Vitamins-Minerals (VISION FORMULA/LUTEIN) TABS Take 1 tablet by mouth in the morning and at bedtime.     Omega-3 Fatty Acids (FISH OIL) 1000 MG CAPS Take 3,000 mg by mouth daily.     vitamin B-12 (CYANOCOBALAMIN) 1000 MCG tablet Take 1,000 mcg by mouth daily.     vitamin E 1000 UNIT capsule Take 1,000 Units by mouth daily.     zinc gluconate 50 MG tablet Take 50 mg by mouth daily.     ziprasidone (GEODON) 20 MG capsule TAKE 1 CAPSULE BY MOUTH ONCE DAILY WITH SUPPER 30 capsule 2   Oxcarbazepine (TRILEPTAL) 300 MG tablet Take 1 tablet (300 mg total) by mouth as directed. Take 1 tablet daily AM and 2 tablets at bedtime 90 tablet 2   No current facility-administered medications for this visit.     Musculoskeletal: Strength & Muscle Tone:  UTA Gait & Station:  UTA Patient leans: N/A  Psychiatric Specialty Exam: Review of Systems  Psychiatric/Behavioral:  Positive for sleep disturbance.   All other systems reviewed and are negative.   There were no vitals taken for this visit.There is no height or weight on file to calculate BMI.  General Appearance:  UTA  Eye Contact:   UTA  Speech:  Clear and Coherent  Volume:  Normal  Mood:  Euthymic  Affect:   UTA  Thought Process:  Goal Directed and Descriptions of Associations: Intact  Orientation:  Full (Time, Place, and Person)  Thought Content: Logical   Suicidal Thoughts:  No  Homicidal Thoughts:  No  Memory:  Immediate;   Fair Recent;   Fair Remote;   Fair  Judgement:  Fair  Insight:  Fair  Psychomotor Activity:    UTA  Concentration:  Concentration: Fair and Attention Span: Fair  Recall:  [employer] of Knowledge: Fair  Language: Fair  Akathisia:  No  Handed:  Right  AIMS (if indicated): not done  Assets:  Communication Skills Desire for Improvement Housing Social Support  ADL's:  Intact  Cognition: WNL  Sleep:   Restless   Screenings: [place] Office Visit from 04/09/2022 in [facility] Office Visit from 01/04/2022 in [facility] Total Score 0 0      GAD-7    [place] Visit from 01/04/2022 in [facility]  Total GAD-7 Score 2      Zinc from 11/06/2017 in [facility]  Total Score (max 30 points ) 30      PHQ2-9    Flowsheet Row Video Visit from 07/13/2022 in [facility]  Visit from 04/09/2022 in [facility] Office Visit from 01/29/2022 in [facility] Office Visit from 01/04/2022 in [place] [facility] Office Visit from 09/13/2021 in [facility]  PHQ-2 Total Score 0 0 0 0 0  PHQ-9 Total Score -- 0 -- -- --      Flowsheet Row Video Visit from 07/13/2022 in [facility] Office Visit from 04/09/2022 in [facility] Admission (Discharged) from 03/27/2022 in [place] [facility] RISK CATEGORY Low Risk No Risk No Risk        Assessment and Plan: DAYAN KREIS is a 71 year old Caucasian female who has a history of bipolar disorder, multiple medical problems was evaluated by phone today.  Patient is currently struggling with sleep , will benefit from the following plan.  Plan Bipolar disorder in remission Geodon 20 mg p.o. daily with supper Lithium 600 mg p.o. daily at bedtime Trileptal 300 mg p.o. daily in the  morning and 600 mg at bedtime. Lithium level-0.5-borderline therapeutic-dated 01/08/2022.  Insomnia unspecified-unstable Discussed sleep hygiene techniques.  Patient to keep a sleep diary. Advised not to watch TV or play on electronic devices prior to bedtime.  Patient to work on relaxation techniques like reading a book, coloring, avoid caffeine at least after 4 PM. Will consider adding a sleep medication as needed.  High risk medication use-will repeat lithium level, BUN/creatinine, sodium, TSH, hemoglobin A1c, prolactin, platelet count since she is on multiple medications including lithium and these labs needs to be monitored.  She is also scheduled to get a lipid panel at her primary care provider's office.  Patient to go to [facility] lab.  Follow-up in clinic in 3 months or sooner in person.   I have spent at least 25 minutes non face to face with patient today .     Consent: Patient/Guardian gives verbal consent for treatment and assignment of benefits for services provided during this visit. Patient/Guardian expressed understanding and agreed to proceed.   This note was generated in part or whole with voice recognition software. Voice recognition is usually quite accurate but there are transcription errors that can and very often do occur. I apologize for any typographical errors that were not detected and corrected.      Ursula Alert, MD 07/13/2022, 10:29 AM

## 2022-07-16 ENCOUNTER — Telehealth: Payer: Self-pay

## 2022-07-16 ENCOUNTER — Other Ambulatory Visit: Payer: Self-pay | Admitting: Psychiatry

## 2022-07-16 DIAGNOSIS — F3176 Bipolar disorder, in full remission, most recent episode depressed: Secondary | ICD-10-CM

## 2022-07-16 NOTE — Telephone Encounter (Signed)
pt left message that she was out of lithium and [facility] states that they dont have rx.

## 2022-07-16 NOTE — Telephone Encounter (Signed)
called pharmacy tried to speak with [place] but it kept ring so i called it again and this time left a message that the rx was eprescribed on 07-11-22 @ 5:40

## 2022-07-16 NOTE — Telephone Encounter (Signed)
called patient and explained that i tried to speak with someone at [facility] but i ended up having toleave a message that rx was eprescribed.

## 2022-07-20 ENCOUNTER — Ambulatory Visit
Admission: RE | Admit: 2022-07-20 | Discharge: 2022-07-20 | Disposition: A | Discharge status: Home / Self Care | Payer: Medicare Other | Source: Ambulatory Visit | Attending: Family Medicine | Admitting: Family Medicine

## 2022-07-20 DIAGNOSIS — Z1231 Encounter for screening mammogram for malignant neoplasm of breast: Secondary | ICD-10-CM

## 2022-07-21 ENCOUNTER — Other Ambulatory Visit
Admission: RE | Admit: 2022-07-21 | Discharge: 2022-07-21 | Disposition: A | Discharge status: Home / Self Care | Payer: Medicare Other | Attending: Psychiatry | Admitting: Psychiatry

## 2022-07-21 DIAGNOSIS — E782 Mixed hyperlipidemia: Secondary | ICD-10-CM | POA: Diagnosis not present

## 2022-07-21 DIAGNOSIS — F3176 Bipolar disorder, in full remission, most recent episode depressed: Secondary | ICD-10-CM | POA: Insufficient documentation

## 2022-07-21 DIAGNOSIS — Z79899 Other long term (current) drug therapy: Secondary | ICD-10-CM | POA: Diagnosis not present

## 2022-07-21 DIAGNOSIS — I10 Essential (primary) hypertension: Secondary | ICD-10-CM | POA: Insufficient documentation

## 2022-07-21 LAB — LIPID PANEL
Cholesterol: 179 mg/dL (ref 0–200)
HDL: 75 mg/dL (ref >40)
LDL Cholesterol: 91 mg/dL (ref 0–99)
Total CHOL/HDL Ratio: 2.4 RATIO
Triglycerides: 63 mg/dL (ref <150)
VLDL: 13 mg/dL (ref 0–40)

## 2022-07-21 LAB — COMPREHENSIVE METABOLIC PANEL
ALT: 40 U/L (ref 0–44)
AST: 37 U/L (ref 15–41)
Albumin: 4.6 g/dL (ref 3.5–5.0)
Alkaline Phosphatase: 84 U/L (ref 38–126)
Anion gap: 8 (ref 5–15)
BUN: 26 mg/dL — ABNORMAL HIGH (ref 8–23)
CO2: 23 mmol/L (ref 22–32)
Calcium: 9.6 mg/dL (ref 8.9–10.3)
Chloride: 107 mmol/L (ref 98–111)
Creatinine, Ser: 0.94 mg/dL (ref 0.44–1.00)
GFR, Estimated: >60 mL/min (ref >60)
Glucose, Bld: 105 mg/dL — ABNORMAL HIGH (ref 70–99)
Potassium: 4.5 mmol/L (ref 3.5–5.1)
Sodium: 138 mmol/L (ref 135–145)
Total Bilirubin: 0.6 mg/dL (ref 0.3–1.2)
Total Protein: 7.9 g/dL (ref 6.5–8.1)

## 2022-07-21 LAB — LITHIUM LEVEL: Lithium Lvl: 0.49 mmol/L — ABNORMAL LOW (ref 0.60–1.20)

## 2022-07-23 ENCOUNTER — Telehealth: Payer: Self-pay | Admitting: Psychiatry

## 2022-07-23 DIAGNOSIS — F3176 Bipolar disorder, in full remission, most recent episode depressed: Secondary | ICD-10-CM

## 2022-07-23 MED ORDER — LITHIUM CARBONATE ER 300 MG PO TBCR: 600.0000 mg | EXTENDED_RELEASE_TABLET | Freq: Every day | ORAL | 2 refills | Status: DC

## 2022-07-23 NOTE — Telephone Encounter (Signed)
Returned call to patient, to discuss her lithium level-reported on 07/21/2022-0.49-subtherapeutic however patient is currently stable on this medication dosage and the last time she went up on the lithium dose she had side effects of diarrhea.  Patient hence to continue the same dosage at this time.  I have also sent lithium refill to pharmacy.  Denies any other concerns today.

## 2022-07-25 ENCOUNTER — Other Ambulatory Visit: Payer: Medicare Other

## 2022-07-25 ENCOUNTER — Telehealth: Payer: Self-pay | Admitting: Family Medicine

## 2022-07-25 NOTE — Telephone Encounter (Signed)
Patient is coming into office for labs on 08/27/2022, does she only need TSH?

## 2022-07-30 LAB — HM DIABETES FOOT EXAM: HM Diabetic Foot Exam: NORMAL

## 2022-08-01 ENCOUNTER — Ambulatory Visit: Payer: Medicare Other | Admitting: Family Medicine

## 2022-08-05 ENCOUNTER — Other Ambulatory Visit: Payer: Self-pay | Admitting: Family Medicine

## 2022-08-05 DIAGNOSIS — E785 Hyperlipidemia, unspecified: Secondary | ICD-10-CM

## 2022-08-27 ENCOUNTER — Other Ambulatory Visit (INDEPENDENT_AMBULATORY_CARE_PROVIDER_SITE_OTHER): Payer: Medicare Other

## 2022-08-27 ENCOUNTER — Telehealth: Payer: Self-pay

## 2022-08-27 DIAGNOSIS — E039 Hypothyroidism, unspecified: Secondary | ICD-10-CM | POA: Diagnosis not present

## 2022-08-27 LAB — TSH: TSH: 1.82 u[IU]/mL (ref 0.35–5.50)

## 2022-08-27 NOTE — Telephone Encounter (Signed)
  left patient a message that dr. Bennetta Laos order the [facility] for today.  I have one of dr. Shea Evans patient call and said that she had her TSH done.  I pulled it up and Dr. Cornelius Moras was the one that done it for today.  pt it looks like dr. Shea Evans had order a lot of lab back on 07-13-22 and it has "Not Released" so the lab did not draw those labs.

## 2022-08-27 NOTE — Telephone Encounter (Signed)
pt called state that she just had her TSH done and that she wanted to see if the results had been review yet

## 2022-08-29 NOTE — Telephone Encounter (Signed)
left message for patient with instructions per dr. Modesta Messing order.

## 2022-09-07 ENCOUNTER — Encounter: Payer: Self-pay | Admitting: Family Medicine

## 2022-09-07 ENCOUNTER — Ambulatory Visit (INDEPENDENT_AMBULATORY_CARE_PROVIDER_SITE_OTHER): Payer: Medicare Other | Admitting: Family Medicine

## 2022-09-07 VITALS — BP 110/60 | HR 76 | Temp 98.1°F | Ht 67.0 in | Wt 132.0 lb

## 2022-09-07 DIAGNOSIS — I10 Essential (primary) hypertension: Secondary | ICD-10-CM | POA: Diagnosis not present

## 2022-09-07 DIAGNOSIS — M19042 Primary osteoarthritis, left hand: Secondary | ICD-10-CM

## 2022-09-07 DIAGNOSIS — E039 Hypothyroidism, unspecified: Secondary | ICD-10-CM | POA: Diagnosis not present

## 2022-09-07 DIAGNOSIS — M2041 Other hammer toe(s) (acquired), right foot: Secondary | ICD-10-CM

## 2022-09-07 NOTE — Patient Instructions (Signed)
Please try the voltaren gel over the counter for your thumb pain.

## 2022-09-07 NOTE — Progress Notes (Signed)
Tommi Rumps, MD Phone: [phone]  Amy Mcconnell is a 71 y.o. female who presents today for f/u.  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 She states that the pain is localized to the thumb only and it occurs after her volunteer shifts. She wonders what she can use for this given that she has to limit tylenol use.   Hypertension: patient is happy that her blood pressure has been good on Cardura. She denies any side effects from the medication. She would like to start checking her blood pressures at home but wants to know the accuracy of her cuff. Advised patient that she can create a nurse visit here to assist her with that. Denies chest pain, shortness of breath, and edema on lower extremities.  Hammertoe: Patient reports that her foot pain is resolved after surgery. She states that she wished she got the hammertoe surgery sooner. 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  Hypothyroidism: patient's TSH was normal when checked last week on 10/23. She denies hair loss and cold intolerance.  Social History   Tobacco Use  Smoking Status Former   Packs/day: 0.50   Years: 13.00   Total pack years: 6.50   Types: Cigarettes   Quit date: 01/23/2000   Years since quitting: 22.6  Smokeless Tobacco Never    Current Outpatient Medications on File Prior to Visit  Medication Sig Dispense Refill   acetaminophen (TYLENOL) 325 MG tablet Take 650 mg by mouth every 6 (six) hours as needed.     Ascorbic Acid (VITAMIN C) 1000 MG tablet Take 1,000 mg by mouth daily.     atorvastatin (LIPITOR) 20 MG tablet Take 1 tablet by mouth once daily 90 tablet 0   B Complex Vitamins (VITAMIN B COMPLEX PO) Take 1 tablet by mouth daily.     Bacillus Coagulans-Inulin (ALIGN PREBIOTIC-PROBIOTIC PO) Take by mouth.     Biotin 5 MG CAPS Take 5 mg by mouth daily.     calcium carbonate (OS-CAL) 600 MG TABS tablet  Take 600 mg by mouth daily.      Cholecalciferol (VITAMIN D3) 50 MCG (2000 UT) capsule Take 2,000 Units by mouth daily.     diphenhydrAMINE (BENADRYL) 25 mg capsule Take 25 mg by mouth daily as needed for allergies.     docusate sodium (COLACE) 100 MG capsule Take 100 mg by mouth daily.     doxazosin (CARDURA) 2 MG tablet Take 1 tablet (2 mg total) by mouth 2 (two) times daily. May take extra 2 mg daily as needed for BP> 160 180 tablet 2   EPINEPHrine (EPIPEN 2-PAK) 0.3 mg/0.3 mL IJ SOAJ injection Inject 0.3 mg into the muscle as needed for anaphylaxis. 2 each 0   fexofenadine (ALLEGRA) 180 MG tablet Take 180 mg by mouth daily as needed for allergies.     Flaxseed, Linseed, (FLAXSEED OIL PO) Take 1 capsule by mouth daily.     levothyroxine (SYNTHROID) 125 MCG tablet Take 1 tablet (125 mcg total) by mouth daily before breakfast. 90 tablet 1   lithium carbonate (LITHOBID) 300 MG CR tablet Take 2 tablets (600 mg total) by mouth at bedtime. 60 tablet 2   magnesium oxide (MAG-OX) 400 MG tablet Take 400 mg by mouth daily.     Multiple Vitamins-Minerals (MULTIVITAMIN WITH MINERALS) tablet Take 1 tablet by mouth daily.     Multiple Vitamins-Minerals (VISION FORMULA/LUTEIN) TABS Take 1 tablet by  mouth in the morning and at bedtime.     Omega-3 Fatty Acids (FISH OIL) 1000 MG CAPS Take 3,000 mg by mouth daily.     Oxcarbazepine (TRILEPTAL) 300 MG tablet Take 1 tablet (300 mg total) by mouth as directed. Take 1 tablet daily AM and 2 tablets at bedtime 90 tablet 2   vitamin B-12 (CYANOCOBALAMIN) 1000 MCG tablet Take 1,000 mcg by mouth daily.     vitamin E 1000 UNIT capsule Take 1,000 Units by mouth daily.     zinc gluconate 50 MG tablet Take 50 mg by mouth daily.     ziprasidone (GEODON) 20 MG capsule TAKE 1 CAPSULE BY MOUTH ONCE DAILY WITH SUPPER 30 capsule 2   No current facility-administered medications on file prior to visit.     ROS see history of present illness  Objective  Vitals:   09/07/22  1020 09/07/22 1052  BP: 118/80 110/60  Pulse: 76   Temp: 98.1 F (36.7 C)   SpO2: 98%     BP Readings from Last 3 Encounters:  09/07/22 110/60  05/23/22 122/68  04/09/22 133/75   Wt Readings from Last 3 Encounters:  09/07/22 132 lb (59.9 kg)  05/23/22 139 lb (63 kg)  04/09/22 142 lb 6.4 oz (64.6 kg)    Physical Exam Constitutional:      Appearance: Normal appearance.  HENT:     Head: Normocephalic and atraumatic.  Cardiovascular:     Rate and Rhythm: Normal rate and regular rhythm.  Pulmonary:     Effort: Pulmonary effort is normal.     Breath sounds: Normal breath sounds.  Musculoskeletal:     Comments: L thumb CMC: Enlarged and tender to palpation. No erythema or excessive warmth.  Full range of motion. No anatomic snuff box tenderness. No other tenderness in her hand.   Skin:    General: Skin is warm and dry.  Neurological:     Mental Status: She is alert.  Psychiatric:        Mood and Affect: Mood normal.      Assessment/Plan: Please see individual problem list.  Problem List Items Addressed This Visit       Cardiovascular and Mediastinum   Essential hypertension - Primary    Well-controlled.  She will continue Cardura 2 mg twice daily.         Endocrine   Hypothyroidism    TSH is within normal limits. She has been taking biotin for her hair. Advised stopping biotin 1 week prior to future lab draws so we can ensure her TSH is well controlled.  Continue levothyroxine 125 mcg once daily.         Musculoskeletal and Integument   Osteoarthritis    Left carpometacarpal joint on thumb is painful after her volunteer shifts. Advised patient that it is likely arthritis as pain is localized to the [facility] joint. Recommended resting the hand from overuse and to apply voltaren gel for the pain over-the-counter. Advised patient to call back if there is no relief.       Hammertoe of right foot    Patient reports great result from hammertoe surgery and does not  currently have pain.         Health Maintenance: patient asked about Pneumonia and RSV vaccines. Advised patient that since she's already had 3 pneumonia vaccines, it's not necessary to have another one. Encouraged patient to have the RSV vaccine as it is recommended for all people above 7 years of age. Patient indicated  understanding and will check with her pharmacy for affordability.   Return in about 6 months (around 03/08/2023).   Marisa Cyphers, Medical Student [place]

## 2022-09-07 NOTE — Assessment & Plan Note (Signed)
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 Advised patient that it is likely arthritis as pain is localized to the [facility] joint. Recommended resting the hand from overuse and to apply voltaren gel for the pain over-the-counter. Advised patient to call back if there is no relief.

## 2022-09-07 NOTE — Assessment & Plan Note (Signed)
Well-controlled.  She will continue Cardura 2 mg twice daily.

## 2022-09-07 NOTE — Assessment & Plan Note (Signed)
TSH is within normal limits. She has been taking biotin for her hair. Advised stopping biotin 1 week prior to future lab draws so we can ensure her TSH is well controlled.  Continue levothyroxine 125 mcg once daily.

## 2022-09-07 NOTE — Assessment & Plan Note (Signed)
Patient reports great result from hammertoe surgery and does not currently have pain.

## 2022-09-07 NOTE — Progress Notes (Signed)
Patient seen along with medical student Zada Zhong.  I personally evaluated this patient along with the student, and verified all aspects of the history, physical exam, and medical decision making as documented by the student.  I agree with the student's documentation and have made all necessary edits.  Bradin Mcadory, MD  

## 2022-09-15 ENCOUNTER — Other Ambulatory Visit: Payer: Self-pay | Admitting: Psychiatry

## 2022-09-15 DIAGNOSIS — F3176 Bipolar disorder, in full remission, most recent episode depressed: Secondary | ICD-10-CM

## 2022-09-30 ENCOUNTER — Other Ambulatory Visit: Payer: Self-pay | Admitting: Family Medicine

## 2022-09-30 DIAGNOSIS — E039 Hypothyroidism, unspecified: Secondary | ICD-10-CM

## 2022-10-03 DIAGNOSIS — L578 Other skin changes due to chronic exposure to nonionizing radiation: Secondary | ICD-10-CM | POA: Diagnosis not present

## 2022-10-03 DIAGNOSIS — L821 Other seborrheic keratosis: Secondary | ICD-10-CM | POA: Diagnosis not present

## 2022-10-03 DIAGNOSIS — L570 Actinic keratosis: Secondary | ICD-10-CM | POA: Diagnosis not present

## 2022-10-03 DIAGNOSIS — L853 Xerosis cutis: Secondary | ICD-10-CM | POA: Diagnosis not present

## 2022-10-14 ENCOUNTER — Other Ambulatory Visit: Payer: Self-pay | Admitting: Psychiatry

## 2022-10-14 DIAGNOSIS — F3176 Bipolar disorder, in full remission, most recent episode depressed: Secondary | ICD-10-CM

## 2022-10-17 ENCOUNTER — Ambulatory Visit: Payer: BC Managed Care – PPO | Admitting: Psychiatry

## 2022-10-22 ENCOUNTER — Telehealth: Payer: Self-pay

## 2022-10-22 NOTE — Telephone Encounter (Signed)
She is on medications like Lithium and she needs to discuss this with whoever is asking her to donate blood . Usually they sent [place] a form asking for information about current medications and so on. Please advise her she could do that as well.  Thank you

## 2022-10-22 NOTE — Telephone Encounter (Signed)
Medication management - Patient left a message to make sure it is okay for her to donate blood with the medications she is currently taking. Patient wanted to check with provider to verify this is okay as she plans to donate blood later this week.

## 2022-10-22 NOTE — Telephone Encounter (Signed)
Medication management - Telephone call with patient to inform Dr. Shea Evans advised that patient check with the agency she is going to donate blood and to inform them she is taking Lithium to see if she could still volunteer. Also, informed pt she could have them send a form about her medications if that would be easier.  Requested patient call back if anything else needed.

## 2022-10-27 ENCOUNTER — Other Ambulatory Visit: Payer: Self-pay | Admitting: Cardiovascular Disease

## 2022-11-03 ENCOUNTER — Other Ambulatory Visit: Payer: Self-pay | Admitting: Cardiovascular Disease

## 2022-11-06 ENCOUNTER — Other Ambulatory Visit: Payer: Self-pay | Admitting: Cardiovascular Disease

## 2022-11-06 ENCOUNTER — Encounter: Payer: Self-pay | Admitting: Psychiatry

## 2022-11-06 ENCOUNTER — Ambulatory Visit (INDEPENDENT_AMBULATORY_CARE_PROVIDER_SITE_OTHER): Payer: Medicare Other | Admitting: Psychiatry

## 2022-11-06 VITALS — BP 124/76 | HR 79 | Temp 97.9°F | Ht 67.0 in | Wt 134.4 lb

## 2022-11-06 DIAGNOSIS — F3176 Bipolar disorder, in full remission, most recent episode depressed: Secondary | ICD-10-CM | POA: Diagnosis not present

## 2022-11-06 DIAGNOSIS — Z79899 Other long term (current) drug therapy: Secondary | ICD-10-CM

## 2022-11-06 DIAGNOSIS — F5101 Primary insomnia: Secondary | ICD-10-CM

## 2022-11-06 MED ORDER — LITHIUM CARBONATE ER 300 MG PO TBCR: 600.0000 mg | EXTENDED_RELEASE_TABLET | Freq: Every day | ORAL | 2 refills | Status: DC

## 2022-11-06 MED ORDER — DOXAZOSIN MESYLATE 2 MG PO TABS: 2.0000 mg | ORAL_TABLET | Freq: Two times a day (BID) | ORAL | 2 refills | Status: DC

## 2022-11-06 NOTE — Progress Notes (Signed)
[facility] MD OP Progress Note  11/06/2022 9:50 AM ZILPHIA KOZINSKI  MRN:  [id number]  Chief Complaint:  Chief Complaint  Patient presents with   Follow-up   Medication Refill   Depression   HPI: Amy Mcconnell is a 72 year old Caucasian female who is currently divorced, lives in [place], has a history of bipolar disorder, insomnia, multiple medical problems including essential hypertension, tachycardia, hyperlipidemia, heart murmur, thyroid abnormalities was evaluated in office today.  Patient today reports she is currently improving with regards to her sleep.  She has been keeping a sleep log which she brought to the appointment.  She reports she has cut back completely on the caffeine.  She reports she also drinks milk or yogurt at night which helps to fall back asleep if sleep is interrupted when she has to urinate.  She also has cut back on watching TV at night.  That also seems to help.  Overall she has been getting around 7 hours of sleep.  Reports mood symptoms are stable.  Denies any significant manic or depressive symptoms.  Denies any hallucinations, did not appear to be preoccupied with any delusions or paranoia.  Patient reports she is compliant on her medications.  May have had some jerks on the Geodon on and off however that has been ongoing since the past several years and it does not bother her, does not happen often.  Patient denies any suicidality or homicidality.  Patient appeared to be alert, oriented to person place time and situation.  3 word memory immediate, 3 out of 3, after 5 minutes 3 out of 3.  Attention and focus seem to be good.  Patient was able to do serial sevens.  Patient reports she was able to spend her birthday with her brother who gave her a surprise birthday party.  Patient denies any other concerns today.  Visit Diagnosis:    ICD-10-CM   1. Bipolar disorder, in full remission, most recent episode depressed (HCC)  F31.76 Lithium level    Prolactin     Platelet count    lithium carbonate (LITHOBID) 300 MG ER tablet    2. Primary insomnia  F51.01     3. High risk medication use  Z79.899 Lithium level    Prolactin    Platelet count      Past Psychiatric History: Reviewed past psychiatric history from progress note on 01/04/2022.  Past trials of medications-multiple.  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 Thurmond Butts - every 3 months   Broken foot    left    Contact dermatitis and other eczema due to other specified agent    Contusion of unspecified part of lower limb    Essential hypertension, benign    Heart murmur    Other and unspecified hyperlipidemia    Other specified disorders of thyroid    Reflux esophagitis    Scalp laceration 04/13/2019   Tachycardia    a. isolated episode, seen in ED with negative work up    Past Surgical History:  Procedure Laterality Date   CATARACT EXTRACTION     COLONOSCOPY WITH PROPOFOL N/A 03/27/2022   Procedure: COLONOSCOPY WITH PROPOFOL;  Surgeon: Lucilla Lame, MD;  Location: [facility] END[facility]COPY;  Service: Endoscopy;  Laterality: N/A;   hamertoe     HAMMER TOE SURGERY  11/30/2021   right 4th toe 11/10/21 Dr.  McDonald   SALPINGECTOMY Left    STERILIZATION     Her decision since dz of bipolar    Family Psychiatric History: Reviewed family psychiatric history from progress note on 01/04/2022.  Family History:  Family History  Problem Relation Age of Onset   Arrhythmia Mother        A-Fib   Heart failure Mother    Hyperlipidemia Mother    Hypertension Mother    Alzheimer's disease Mother    Hypertension Father    Hyperlipidemia Father    Mental retardation Father 40       Suicide   Hypertension Brother    Hyperlipidemia Brother    Hypertension Brother    Hyperlipidemia Brother    Asthma Brother    COPD Brother    Hyperlipidemia Brother    Hypertension Brother    Alcohol abuse  Brother    Depression Brother    Emphysema Brother    Hyperlipidemia Brother    Hypertension Brother    Breast cancer Maternal Aunt    Bipolar disorder Paternal Uncle    Bipolar disorder Paternal Uncle    Bipolar disorder Paternal Uncle    Bipolar disorder Paternal Grandmother     Social History: Reviewed social history from progress note on 01/04/2022. Social History   Socioeconomic History   Marital status: Divorced    Spouse name: Not on file   Number of children: 0   Years of education: 14   Highest education level: Not on file  Occupational History   Not on file  Tobacco Use   Smoking status: Former    Packs/day: 0.50    Years: 13.00    Total pack years: 6.50    Types: Cigarettes    Quit date: 01/23/2000    Years since quitting: 22.8   Smokeless tobacco: Never  Vaping Use   Vaping Use: Never used  Substance and Sexual Activity   Alcohol use: No   Drug use: No   Sexual activity: Not Currently  Other Topics Concern   Not on file  Social History Narrative   Ms. Devivo grew up in [place], [place]. She lives in [place]. Ms. Mckain is divorced. She has 2 cats (Charlie and Angie). She volunteers at [facility]. She also volunteers 2 days at [facility]. She enjoys reading, garding and cooking.   Social Determinants of Health   Financial Resource Strain: Low Risk  (11/14/2021)   Overall Financial Resource Strain (CARDIA)    Difficulty of Paying Living Expenses: Not hard at all  Food Insecurity: No Food Insecurity (11/14/2021)   Hunger Vital Sign    Worried About Running Out of Food in the Last Year: Never true    Ran Out of Food in the Last Year: Never true  Transportation Needs: No Transportation Needs (11/14/2021)   PRAPARE - Hydrologist (Medical): No    Lack of Transportation (Non-Medical): No  Physical Activity: Sufficiently Active (11/11/2020)   Exercise Vital Sign    Days of Exercise per Week: 5 days    Minutes of  Exercise per Session: 30 min  Stress: No Stress Concern Present (11/14/2021)   [place]    Feeling of Stress : Not at all  Social Connections: Moderately Isolated (11/14/2021)   Social Connection and Isolation Panel [NHANES]    Frequency of Communication with Friends and Family: More than three times a week    Frequency of Social  Gatherings with Friends and Family: Once a week    Attends Religious Services: Never    Marine scientist or Organizations: Yes    Attends Archivist Meetings: Never    Marital Status: Divorced    Allergies:  Allergies  Allergen Reactions   Apple Juice Anaphylaxis   Ace Inhibitors     Angioedema    Angiotensin Receptor Blockers     Angioedema    Benicar [Olmesartan]     Mouth & Lip Swelling, "I swell from the waist down". Throat swelling   Poison Ivy Extract [Poison Ivy Extract]     Blisters   Purell Instant Hand [Alcohol] Swelling   Triclosan     Metabolic Disorder Labs: No results found for: "HGBA1C", "MPG" No results found for: "PROLACTIN" Lab Results  Component Value Date   CHOL 179 07/21/2022   TRIG 63 07/21/2022   HDL 75 07/21/2022   CHOLHDL 2.4 07/21/2022   VLDL 13 07/21/2022   LDLCALC 91 07/21/2022   LDLCALC 58 06/23/2021   Lab Results  Component Value Date   TSH 1.82 08/27/2022   TSH 1.540 01/08/2022    Therapeutic Level Labs: Lab Results  Component Value Date   LITHIUM 0.49 (L) 07/21/2022   LITHIUM 0.5 01/08/2022   No results found for: "VALPROATE" No results found for: "CBMZ"  Current Medications: Current Outpatient Medications  Medication Sig Dispense Refill   acetaminophen (TYLENOL) 325 MG tablet Take 650 mg by mouth every 6 (six) hours as needed.     Ascorbic Acid (VITAMIN C) 1000 MG tablet Take 1,000 mg by mouth daily.     atorvastatin (LIPITOR) 20 MG tablet Take 1 tablet by mouth once daily 90 tablet 0   B Complex Vitamins  (VITAMIN B COMPLEX PO) Take 1 tablet by mouth daily.     Bacillus Coagulans-Inulin (ALIGN PREBIOTIC-PROBIOTIC PO) Take by mouth.     Biotin 5 MG CAPS Take 5 mg by mouth daily.     calcium carbonate (OS-CAL) 600 MG TABS tablet Take 600 mg by mouth daily.      Cholecalciferol (VITAMIN D3) 50 MCG (2000 UT) capsule Take 2,000 Units by mouth daily.     diphenhydrAMINE (BENADRYL) 25 mg capsule Take 25 mg by mouth daily as needed for allergies.     docusate sodium (COLACE) 100 MG capsule Take 100 mg by mouth daily.     doxazosin (CARDURA) 2 MG tablet Take 1 tablet (2 mg total) by mouth 2 (two) times daily. May take extra 2 mg daily as needed for BP> 160 180 tablet 2   EPINEPHrine (EPIPEN 2-PAK) 0.3 mg/0.3 mL IJ SOAJ injection Inject 0.3 mg into the muscle as needed for anaphylaxis. 2 each 0   fexofenadine (ALLEGRA) 180 MG tablet Take 180 mg by mouth daily as needed for allergies.     Flaxseed, Linseed, (FLAXSEED OIL PO) Take 1 capsule by mouth daily.     levothyroxine (SYNTHROID) 125 MCG tablet TAKE 1 TABLET BY MOUTH ONCE DAILY BEFORE BREAKFAST 90 tablet 3   magnesium oxide (MAG-OX) 400 MG tablet Take 400 mg by mouth daily.     Multiple Vitamins-Minerals (MULTIVITAMIN WITH MINERALS) tablet Take 1 tablet by mouth daily.     Multiple Vitamins-Minerals (VISION FORMULA/LUTEIN) TABS Take 1 tablet by mouth in the morning and at bedtime.     Omega-3 Fatty Acids (FISH OIL) 1000 MG CAPS Take 3,000 mg by mouth daily.     Oxcarbazepine (TRILEPTAL) 300 MG tablet TAKE 1 TABLET  BY MOUTH IN THE MORNING AND 2 AT BEDTIME 90 tablet 3   vitamin B-12 (CYANOCOBALAMIN) 1000 MCG tablet Take 1,000 mcg by mouth daily.     vitamin E 1000 UNIT capsule Take 1,000 Units by mouth daily.     zinc gluconate 50 MG tablet Take 50 mg by mouth daily.     ziprasidone (GEODON) 20 MG capsule TAKE 1 CAPSULE BY MOUTH ONCE DAILY WITH SUPPER 30 capsule 3   lithium carbonate (LITHOBID) 300 MG ER tablet Take 2 tablets (600 mg total) by mouth at  bedtime. 60 tablet 2   No current facility-administered medications for this visit.     Musculoskeletal: Strength & Muscle Tone:  UTA Gait & Station: normal Patient leans: N/A  Psychiatric Specialty Exam: Review of Systems  Psychiatric/Behavioral: Negative.    All other systems reviewed and are negative.   Blood pressure 124/76, pulse 79, temperature 97.9 F (36.6 C), temperature source Oral, height '5\' 7"'$  (1.702 m), weight 134 lb 6.4 oz (61 kg), SpO2 96 %.Body mass index is 21.05 kg/m.  General Appearance: Casual  Eye Contact:  Fair  Speech:  Clear and Coherent  Volume:  Normal  Mood:  Euthymic  Affect:  Congruent  Thought Process:  Goal Directed and Descriptions of Associations: Intact  Orientation:  Full (Time, Place, and Person)  Thought Content: Logical   Suicidal Thoughts:  No  Homicidal Thoughts:  No  Memory:  Immediate;   Fair Recent;   Fair Remote;   Fair  Judgement:  Fair  Insight:  Fair  Psychomotor Activity:  Normal  Concentration:  Concentration: Fair and Attention Span: Fair  Recall:  [employer] of Knowledge: Fair  Language: Fair  Akathisia:  No  Handed:  Right  AIMS (if indicated): done  Assets:  Communication Skills Desire for [place] Talents/Skills  ADL's:  Intact  Cognition: WNL  Sleep:   Improving   Screenings: [place] Office Visit from 11/06/2022 in [facility] Office Visit from 04/09/2022 in [facility] Office Visit from 01/04/2022 in [facility] Total Score 0 0 0      [place] Visit from 11/06/2022 in [place] Visit from 01/04/2022 in [place]  Total GAD-7 Score 0 2      [place] from 11/06/2017 in [facility]  Total Score (max 30 points ) 30      PHQ2-9     [place] Visit from 11/06/2022 in [place] Visit from 09/07/2022 in [place] Video Visit from 07/13/2022 in [place] Visit from 04/09/2022 in [place] Visit from 01/29/2022 in [place]  PHQ-2 Total Score 0 0 0 0 0  PHQ-9 Total Score 0 -- -- 0 --      [place] Office Visit from 11/06/2022 in [place] Video Visit from 07/13/2022 in [place] Office Visit from 04/09/2022 in [facility] No Risk Low Risk No Risk        Assessment and Plan: ALYS DULAK is a 72 year old Caucasian female who has a history of bipolar disorder, multiple medical problems was evaluated in office today.  Patient is improving with regards to her sleep, currently stable with regards  to her mood on the current medication regimen.  Plan as noted below.  Plan Bipolar disorder in remission Geodon 20 mg p.o. daily with supper Lithium 600 mg p.o. daily at bedtime Trileptal 300 mg p.o. daily in the morning and 600 mg at bedtime.  Primary insomnia-improving Continue sleep hygiene techniques. Sleep is currently improving.  High risk medication use-reviewed and discussed lithium-dated 07/21/2022-0.49-subtherapeutic however currently mood symptoms are stable and hence will continue medications as scheduled.  CMP-BUN slightly elevated at 26, glucose elevated at 105-otherwise within normal limits.  Lipid panel-within normal.  TSH-within normal limits-dated 08/27/2022.  Pending labs-prolactin level, hemoglobin A1c, platelet count-although ordered was never completed.  Patient advised to follow up with primary care provider to repeat hemoglobin A1c at her next visit.  Patient advised to repeat lithium level in February-at that time she could also  repeat her platelet count and prolactin level.  Patient agrees to go to lab in the morning before taking her lithium dosage that day.  Follow-up in clinic in 3 to 4 months or sooner if needed. This note was generated in part or whole with voice recognition software. Voice recognition is usually quite accurate but there are transcription errors that can and very often do occur. I apologize for any typographical errors that were not detected and corrected.     Ursula Alert, MD 11/06/2022, 9:50 AM

## 2022-11-06 NOTE — Telephone Encounter (Signed)
*  STAT* If patient is at the pharmacy, call can be transferred to refill team.   1. Which medications need to be refilled? (please list name of each medication and dose if known) Doxazosin 4 mg  2. Which pharmacy/location (including street and city if local pharmacy) is medication to be sent to? [place]  3. Do they need a 30 day or 90 day supply? [place]

## 2022-11-09 ENCOUNTER — Telehealth: Payer: Self-pay

## 2022-11-09 NOTE — Telephone Encounter (Signed)
called pharmacy they state that they had rx and that they will go ahead and get ready. it should be done within the hour.

## 2022-11-09 NOTE — Telephone Encounter (Signed)
Pt.notified

## 2022-11-13 ENCOUNTER — Telehealth: Payer: Self-pay | Admitting: Psychiatry

## 2022-11-13 DIAGNOSIS — F3176 Bipolar disorder, in full remission, most recent episode depressed: Secondary | ICD-10-CM

## 2022-11-13 DIAGNOSIS — Z79899 Other long term (current) drug therapy: Secondary | ICD-10-CM

## 2022-11-13 NOTE — Telephone Encounter (Signed)
Patient stopped by to request labs to be done at [facility] for A1C . Changed her mind about going to [place]. Can you please put that in and she will get it done next week.

## 2022-11-13 NOTE — Telephone Encounter (Signed)
I have placed an order for hemoglobin A1c for this patient for [facility] health lab.  There are other lab orders also in place already which was done at her last visit.  She could get all of these labs done together when she goes in.

## 2022-11-16 ENCOUNTER — Other Ambulatory Visit: Payer: Self-pay | Admitting: Family Medicine

## 2022-11-16 ENCOUNTER — Ambulatory Visit (INDEPENDENT_AMBULATORY_CARE_PROVIDER_SITE_OTHER): Payer: Medicare Other

## 2022-11-16 VITALS — Ht 67.0 in | Wt 134.0 lb

## 2022-11-16 DIAGNOSIS — Z0000 Encounter for general adult medical examination without abnormal findings: Secondary | ICD-10-CM

## 2022-11-16 DIAGNOSIS — E785 Hyperlipidemia, unspecified: Secondary | ICD-10-CM

## 2022-11-16 NOTE — Progress Notes (Signed)
Subjective:   Amy Mcconnell is a 72 y.o. female who presents for Medicare Annual/Subsequent preventive examination.  Review of Systems    No ROS.  Medicare Wellness Virtual Visit.  Visual/audio telehealth visit, UTA vital signs.   See social history for additional risk factors.   Cardiac Risk Factors include: advanced age (>35mn, >>71women);hypertension     Objective:    Today's Vitals   11/16/22 0820  Weight: 134 lb (60.8 kg)  Height: '5\' 7"'$  (1.702 m)   Body mass index is 20.99 kg/m.     11/16/2022    8:26 AM 03/27/2022    8:07 AM 11/14/2021    2:36 PM 07/27/2021    4:07 PM 07/26/2021    1:28 PM 11/11/2020    2:20 PM 11/10/2019    9:01 AM  Advanced Directives  Does Patient Have a Medical Advance Directive? Yes Yes Yes  No No Yes  Type of AParamedicof AGlideLiving will HStrangLiving will HYeomanLiving will    HIglesia [place]ig[place] will  Does patient want to make changes to medical advance directive? No - Patient declined  No - Patient declined    No - Patient declined  Copy of [place] Chart? Yes - validated most recent copy scanned in chart (See row information) No - copy requested Yes - validated most recent copy scanned in chart (See row information)    No - copy requested  Would patient like information on creating a medical advance directive?    No - Patient declined  No - Patient declined     Current Medications (verified) Outpatient Encounter Medications as of 11/16/2022  Medication Sig   acetaminophen (TYLENOL) 325 MG tablet Take 650 mg by mouth every 6 (six) hours as needed.   Ascorbic Acid (VITAMIN C) 1000 MG tablet Take 1,000 mg by mouth daily.   atorvastatin (LIPITOR) 20 MG tablet Take 1 tablet by mouth once daily   B Complex Vitamins (VITAMIN B COMPLEX PO) Take 1 tablet by mouth daily.   Bacillus Coagulans-Inulin (ALIGN PREBIOTIC-PROBIOTIC PO) Take by  mouth.   Biotin 5 MG CAPS Take 5 mg by mouth daily.   calcium carbonate (OS-CAL) 600 MG TABS tablet Take 600 mg by mouth daily.    Cholecalciferol (VITAMIN D3) 50 MCG (2000 UT) capsule Take 2,000 Units by mouth daily.   diphenhydrAMINE (BENADRYL) 25 mg capsule Take 25 mg by mouth daily as needed for allergies.   docusate sodium (COLACE) 100 MG capsule Take 100 mg by mouth daily.   doxazosin (CARDURA) 2 MG tablet Take 1 tablet (2 mg total) by mouth 2 (two) times daily. May take extra 2 mg daily as needed for BP> 160   EPINEPHrine (EPIPEN 2-PAK) 0.3 mg/0.3 mL IJ SOAJ injection Inject 0.3 mg into the muscle as needed for anaphylaxis.   fexofenadine (ALLEGRA) 180 MG tablet Take 180 mg by mouth daily as needed for allergies.   Flaxseed, Linseed, (FLAXSEED OIL PO) Take 1 capsule by mouth daily.   levothyroxine (SYNTHROID) 125 MCG tablet TAKE 1 TABLET BY MOUTH ONCE DAILY BEFORE BREAKFAST   lithium carbonate (LITHOBID) 300 MG ER tablet Take 2 tablets (600 mg total) by mouth at bedtime.   magnesium oxide (MAG-OX) 400 MG tablet Take 400 mg by mouth daily.   Multiple Vitamins-Minerals (MULTIVITAMIN WITH MINERALS) tablet Take 1 tablet by mouth daily.   Multiple Vitamins-Minerals (VISION FORMULA/LUTEIN) TABS Take 1 tablet by mouth  in the morning and at bedtime.   Omega-3 Fatty Acids (FISH OIL) 1000 MG CAPS Take 3,000 mg by mouth daily.   Oxcarbazepine (TRILEPTAL) 300 MG tablet TAKE 1 TABLET BY MOUTH IN THE MORNING AND 2 AT BEDTIME   vitamin B-12 (CYANOCOBALAMIN) 1000 MCG tablet Take 1,000 mcg by mouth daily.   vitamin E 1000 UNIT capsule Take 1,000 Units by mouth daily.   zinc gluconate 50 MG tablet Take 50 mg by mouth daily.   ziprasidone (GEODON) 20 MG capsule TAKE 1 CAPSULE BY MOUTH ONCE DAILY WITH SUPPER   No facility-administered encounter medications on file as of 11/16/2022.    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 Thurmond Butts - every 3 months   Broken foot    left    Contact dermatitis and other eczema due to other specified agent    Contusion of unspecified part of lower limb    Essential hypertension, benign    Heart murmur    Other and unspecified hyperlipidemia    Other specified disorders of thyroid    Reflux esophagitis    Scalp laceration 04/13/2019   Tachycardia    a. isolated episode, seen in ED with negative work up   Past Surgical History:  Procedure Laterality Date   CATARACT EXTRACTION     COLONOSCOPY WITH PROPOFOL N/A 03/27/2022   Procedure: COLONOSCOPY WITH PROPOFOL;  Surgeon: Lucilla Lame, MD;  Location: [facility] ENDOSCOPY;  Service: Endoscopy;  Laterality: N/A;   hamertoe     HAMMER TOE SURGERY  11/30/2021   right 4th toe 11/10/21 Dr. Sherryle Lis   SALPINGECTOMY Left    STERILIZATION     Her decision since dz of bipolar   Family History  Problem Relation Age of Onset   Arrhythmia Mother        A-Fib   Heart failure Mother    Hyperlipidemia Mother    Hypertension Mother    Alzheimer's disease Mother    Hypertension Father    Hyperlipidemia Father    Mental retardation Father 15       Suicide   Hypertension Brother    Hyperlipidemia Brother    Hypertension Brother    Hyperlipidemia Brother    Asthma Brother    COPD Brother    Hyperlipidemia Brother    Hypertension Brother    Alcohol abuse Brother    Depression Brother    Emphysema Brother    Hyperlipidemia Brother    Hypertension Brother    Breast cancer Maternal Aunt    Bipolar disorder Paternal Uncle    Bipolar disorder Paternal Uncle    Bipolar disorder Paternal Uncle    Bipolar disorder Paternal Grandmother    Social History   Socioeconomic History   Marital status: Divorced    Spouse name: Not on file    Number of children: 0   Years of education: 51   Highest education level: Not on file  Occupational History   Not on file  Tobacco Use   Smoking status: Former    Packs/day: 0.50    Years: 13.00    Total pack years: 6.50    Types: Cigarettes    Quit date: 01/23/2000    Years since quitting: 22.8   Smokeless  tobacco: Never  Vaping Use   Vaping Use: Never used  Substance and Sexual Activity   Alcohol use: No   Drug use: No   Sexual activity: Not Currently  Other Topics Concern   Not on file  Social History Narrative   Ms. Turi grew up in [place], [place]. She lives in [place]. Ms. Pilson is divorced. She has 2 cats (Charlie and Angie). 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 She also volunteers 2 days at [facility]. She enjoys reading, garding and cooking.   Social Determinants of Health   Financial Resource Strain: Low Risk  (11/16/2022)   Overall Financial Resource Strain (CARDIA)    Difficulty of Paying Living Expenses: Not hard at all  Food Insecurity: No Food Insecurity (11/16/2022)   Hunger Vital Sign    Worried About Running Out of Food in the Last Year: Never true    Ran Out of Food in the Last Year: Never true  Transportation Needs: No Transportation Needs (11/16/2022)   PRAPARE - Hydrologist (Medical): No    Lack of Transportation (Non-Medical): No  Physical Activity: Sufficiently Active (11/16/2022)   Exercise Vital Sign    Days of Exercise per Week: 5 days    Minutes of Exercise per Session: 30 min  Stress: No Stress Concern Present (11/16/2022)   [facility]    Feeling of Stress : Not at all  Social Connections: Moderately Isolated (11/16/2022)   Social Connection and Isolation Panel [NHANES]    Frequency of Communication with Friends and Family: More than three times a week    Frequency of Social Gatherings with Friends and Family: Once a week    Attends  Religious Services: Never    Marine scientist or Organizations: Yes    Attends Archivist Meetings: Never    Marital Status: Divorced    Tobacco Counseling Counseling given: Not Answered   Clinical Intake:  Pre-visit preparation completed: Yes        Diabetes: No  How often do you need to have someone help you when you read instructions, pamphlets, or other written materials from your doctor or pharmacy?: 1 - Never   Interpreter Needed?: No      Activities of Daily Living    11/16/2022    8:21 AM  In your present state of health, do you have any difficulty performing the following activities:  Hearing? 0  Vision? 0  Difficulty concentrating or making decisions? 0  Walking or climbing stairs? 0  Dressing or bathing? 0  Doing errands, shopping? 0  Preparing Food and eating ? N  Using the Toilet? N  In the past six months, have you accidently leaked urine? N  Do you have problems with loss of bowel control? N  Managing your Medications? N  Managing your Finances? Y  Housekeeping or managing your Housekeeping? N    Patient Care Team: Leone Haven, MD as PCP - General (Family Medicine) Rockey Situ Kathlene November, MD as Consulting Physician (Cardiology)  Indicate any recent Medical Services you may have received from other than [facility] providers in the past year (date may be approximate).     Assessment:   This is a routine wellness examination for Liberal.  I connected with  [name] on 11/16/22 by a audio enabled telemedicine application and verified that I am speaking with the correct person using two identifiers.  Patient Location: Home  Provider Location: Office/Clinic  I discussed the limitations of evaluation and management by telemedicine. The patient expressed understanding and agreed to proceed.   Hearing/Vision screen Hearing Screening - Comments:: Patient is able to hear conversational tones without difficulty.  No issues  reported.   Vision Screening - Comments:: Followed by [facility] Wears corrective lenses  Annual visits  Cataract extraction, R eye  They have regular follow up with the ophthalmologist    Dietary issues and exercise activities discussed: Current Exercise Habits: Home exercise routine, Type of exercise: walking (Isometric exercises), Time (Minutes): 30, Frequency (Times/Week): 5, Weekly Exercise (Minutes/Week): 150, Intensity: Mild   Goals Addressed               This Visit's Progress     Patient Stated     Maintain Healthy Lifestyle (pt-stated)        Stay active. Healthy diet. Stay hydrated.       Depression Screen    11/16/2022    8:26 AM 11/06/2022    9:22 AM 09/07/2022   10:21 AM 07/13/2022    9:36 AM 04/09/2022    9:42 AM 01/29/2022    9:31 AM 01/04/2022    1:16 PM  PHQ 2/9 Scores  PHQ - 2 Score 0  0   0   PHQ- 9 Score            Information is confidential and restricted. Go to Review Flowsheets to unlock data.    Fall Risk    11/16/2022    8:26 AM 09/07/2022   10:21 AM 01/29/2022    9:31 AM 11/14/2021    2:43 PM 09/13/2021    1:16 PM  Fall Risk   Falls in the past year? 0 0 0 0 1  Number falls in past yr: 0 0 0 0 1  Injury with Fall? 0 0 0  1  Risk for fall due to : No Fall Risks No Fall Risks No Fall Risks  History of fall(s)  Follow up Falls evaluation completed;Falls prevention discussed Falls evaluation completed Falls evaluation completed Falls evaluation completed Falls evaluation completed    FALL RISK PREVENTION PERTAINING TO THE HOME: Home free of loose throw rugs in walkways, pet beds, electrical cords, etc? No  Adequate lighting in your home to reduce risk of falls? No   ASSISTIVE DEVICES UTILIZED TO PREVENT FALLS: Life alert? No  Use of a cane, walker or w/c? No   TIMED UP AND GO: Was the test performed? No.   Cognitive Function:    11/06/2017    4:33 PM  MMSE - Mini Mental State Exam  Orientation to time 5  Orientation to Place 5   Registration 3  Attention/ Calculation 5  Recall 3  Language- name 2 objects 2  Language- repeat 1  Language- follow 3 step command 3  Language- read & follow direction 1  Write a sentence 1  Copy design 1  Total score 30        11/16/2022    8:42 AM 11/11/2020    2:17 PM 11/10/2019    8:54 AM 11/07/2018    2:33 PM 10/22/2016   10:34 AM  6CIT Screen  What Year? 0 points 0 points 0 points 0 points 0 points  What month? 0 points 0 points 0 points 0 points 0 points  What time? 0 points 0 points 0 points 0 points 0 points  Count back from 20 0 points 0 points 0 points 0 points 0 points  Months in reverse 0 points 0 points 0 points 0 points 0 points  Repeat phrase 0 points  0 points 0 points   Total Score 0 points  0 points 0 points     Immunizations Immunization History  Administered Date(s) Administered   Fluad Quad(high Dose 65+) 08/09/2021   Influenza, High Dose Seasonal PF 07/23/2018, 08/23/2020   Influenza-Unspecified 08/05/2014, 07/06/2016, 01/06/2020   Moderna Covid-19 Vaccine Bivalent Booster 70yr & up 09/19/2021   PFIZER(Purple Top)SARS-COV-2 Vaccination 11/23/2019, 12/15/2019, 08/26/2020, 03/03/2021   Pneumococcal Conjugate-13 01/23/2016   Pneumococcal Polysaccharide-23 08/22/2011, 01/16/2019   Tdap 12/29/2014   Zoster Recombinat (Shingrix) 07/23/2018, 09/25/2018   Zoster, Live 12/29/2013   Covid-19 vaccine status: Completed vaccines x5.  Screening Tests Health Maintenance  Topic Date Due   COVID-19 Vaccine (6 - 2023-24 season) 12/02/2022 (Originally 07/06/2022)   INFLUENZA VACCINE  02/03/2023 (Originally 06/05/2022)   Medicare Annual Wellness (AWV)  11/17/2023   MAMMOGRAM  07/20/2024   DTaP/Tdap/Td (2 - Td or Tdap) 12/29/2024   COLONOSCOPY (Pts 45-468yrInsurance coverage will need to be confirmed)  03/27/2032   Pneumonia Vaccine 6552Years old  Completed   DEXA SCAN  Completed   Hepatitis C Screening  Completed   Zoster Vaccines- Shingrix  Completed   HPV  VACCINES  Aged Out   Health Maintenance There are no preventive care reminders to display for this patient.  Lung Cancer Screening: (Low Dose CT Chest recommended if Age 73-80ears, 30 pack-year currently smoking OR have quit w/in 15years.) does not qualify.   Hepatitis C Screening: Completed 07/2021.  Vision Screening: Recommended annual ophthalmology exams for early detection of glaucoma and other disorders of the eye.  Dental Screening: Recommended annual dental exams for proper oral hygiene.  Community Resource Referral / Chronic Care Management: CRR required this visit?  No   CCM required this visit?  No      Plan:     I have personally reviewed and noted the following in the patient's chart:   Medical and social history Use of alcohol, tobacco or illicit drugs  Current medications and supplements including opioid prescriptions. Patient is not currently taking opioid prescriptions. Functional ability and status Nutritional status Physical activity Advanced directives List of other physicians Hospitalizations, surgeries, and ER visits in previous 12 months Vitals Screenings to include cognitive, depression, and falls Referrals and appointments  In addition, I have reviewed and discussed with patient certain preventive protocols, quality metrics, and best practice recommendations. A written personalized care plan for preventive services as well as general preventive health recommendations were provided to patient.     DeLeta JunglingLPN   11/09/66/1275

## 2022-11-16 NOTE — Patient Instructions (Signed)
Amy Mcconnell , Thank you for taking time to come for your Medicare Wellness Visit. I appreciate your ongoing commitment to your health goals. Please review the following plan we discussed and let me know if I can assist you in the future.   These are the goals we discussed:  Goals       Patient Stated     Maintain Healthy Lifestyle (pt-stated)      Stay active. Healthy diet. Stay hydrated.        This is a list of the screening recommended for you and due dates:  Health Maintenance  Topic Date Due   COVID-19 Vaccine (6 - 2023-24 season) 12/02/2022*   Flu Shot  02/03/2023*   Medicare Annual Wellness Visit  11/17/2023   Mammogram  07/20/2024   DTaP/Tdap/Td vaccine (2 - Td or Tdap) 12/29/2024   Colon Cancer Screening  03/27/2032   Pneumonia Vaccine  Completed   DEXA scan (bone density measurement)  Completed   Hepatitis C Screening: USPSTF Recommendation to screen - Ages 30-79 yo.  Completed   Zoster (Shingles) Vaccine  Completed   HPV Vaccine  Aged Out  *Topic was postponed. The date shown is not the original due date.    Advanced directives: on file  Conditions/risks identified: none new  Next appointment: Follow up in one year for your annual wellness visit    Preventive Care 65 Years and Older, Female Preventive care refers to lifestyle choices and visits with your health care provider that can promote health and wellness. What does preventive care include? A yearly physical exam. This is also called an annual well check. Dental exams once or twice a year. Routine eye exams. Ask your health care provider how often you should have your eyes checked. Personal lifestyle choices, including: Daily care of your teeth and gums. Regular physical activity. Eating a healthy diet. Avoiding tobacco and drug use. Limiting alcohol use. Practicing safe sex. Taking low-dose aspirin every day. Taking vitamin and mineral supplements as recommended by your health care  provider. What happens during an annual well check? The services and screenings done by your health care provider during your annual well check will depend on your age, overall health, lifestyle risk factors, and family history of disease. Counseling  Your health care provider may ask you questions about your: Alcohol use. Tobacco use. Drug use. Emotional well-being. Home and relationship well-being. Sexual activity. Eating habits. History of falls. Memory and ability to understand (cognition). Work and work Statistician. Reproductive health. Screening  You may have the following tests or measurements: Height, weight, and BMI. Blood pressure. Lipid and cholesterol levels. These may be checked every 5 years, or more frequently if you are over 68 years old. Skin check. Lung cancer screening. You may have this screening every year starting at age 63 if you have a 30-pack-year history of smoking and currently smoke or have quit within the past 15 years. Fecal occult blood test (FOBT) of the stool. You may have this test every year starting at age 27. Flexible sigmoidoscopy or colonoscopy. You may have a sigmoidoscopy every 5 years or a colonoscopy every 10 years starting at age 10. Hepatitis C blood test. Hepatitis B blood test. Sexually transmitted disease (STD) testing. Diabetes screening. This is done by checking your blood sugar (glucose) after you have not eaten for a while (fasting). You may have this done every 1-3 years. Bone density scan. This is done to screen for osteoporosis. You may have this done starting  at age 3. Mammogram. This may be done every 1-2 years. Talk to your health care provider about how often you should have regular mammograms. Talk with your health care provider about your test results, treatment options, and if necessary, the need for more tests. Vaccines  Your health care provider may recommend certain vaccines, such as: Influenza vaccine. This is  recommended every year. Tetanus, diphtheria, and acellular pertussis (Tdap, Td) vaccine. You may need a Td booster every 10 years. Zoster vaccine. You may need this after age 66. Pneumococcal 13-valent conjugate (PCV13) vaccine. One dose is recommended after age 46. Pneumococcal polysaccharide (PPSV23) vaccine. One dose is recommended after age 81. Talk to your health care provider about which screenings and vaccines you need and how often you need them. This information is not intended to replace advice given to you by your health care provider. Make sure you discuss any questions you have with your health care provider. Document Released: 11/18/2015 Document Revised: 07/11/2016 Document Reviewed: 08/23/2015 Elsevier Interactive Patient Education  2017 [place] Prevention in the Home Falls can cause injuries. They can happen to people of all ages. There are many things you can do to make your home safe and to help prevent falls. What can I do on the outside of my home? Regularly fix the edges of walkways and driveways and fix any cracks. Remove anything that might make you trip as you walk through a door, such as a raised step or threshold. Trim any bushes or trees on the path to your home. Use bright outdoor lighting. Clear any walking paths of anything that might make someone trip, such as rocks or tools. Regularly check to see if handrails are loose or broken. Make sure that both sides of any steps have handrails. Any raised decks and porches should have guardrails on the edges. Have any leaves, snow, or ice cleared regularly. Use sand or salt on walking paths during winter. Clean up any spills in your garage right away. This includes oil or grease spills. What can I do in the bathroom? Use night lights. Install grab bars by the toilet and in the tub and shower. Do not use towel bars as grab bars. Use non-skid mats or decals in the tub or shower. If you need to sit down in  the shower, use a plastic, non-slip stool. Keep the floor dry. Clean up any water that spills on the floor as soon as it happens. Remove soap buildup in the tub or shower regularly. Attach bath mats securely with double-sided non-slip rug tape. Do not have throw rugs and other things on the floor that can make you trip. What can I do in the bedroom? Use night lights. Make sure that you have a light by your bed that is easy to reach. Do not use any sheets or blankets that are too big for your bed. They should not hang down onto the floor. Have a firm chair that has side arms. You can use this for support while you get dressed. Do not have throw rugs and other things on the floor that can make you trip. What can I do in the kitchen? Clean up any spills right away. Avoid walking on wet floors. Keep items that you use a lot in easy-to-reach places. If you need to reach something above you, use a strong step stool that has a grab bar. Keep electrical cords out of the way. Do not use floor polish or wax that makes  floors slippery. If you must use wax, use non-skid floor wax. Do not have throw rugs and other things on the floor that can make you trip. What can I do with my stairs? Do not leave any items on the stairs. Make sure that there are handrails on both sides of the stairs and use them. Fix handrails that are broken or loose. Make sure that handrails are as long as the stairways. Check any carpeting to make sure that it is firmly attached to the stairs. Fix any carpet that is loose or worn. Avoid having throw rugs at the top or bottom of the stairs. If you do have throw rugs, attach them to the floor with carpet tape. Make sure that you have a light switch at the top of the stairs and the bottom of the stairs. If you do not have them, ask someone to add them for you. What else can I do to help prevent falls? Wear shoes that: Do not have high heels. Have rubber bottoms. Are comfortable  and fit you well. Are closed at the toe. Do not wear sandals. If you use a stepladder: Make sure that it is fully opened. Do not climb a closed stepladder. Make sure that both sides of the stepladder are locked into place. Ask someone to hold it for you, if possible. Clearly mark and make sure that you can see: Any grab bars or handrails. First and last steps. Where the edge of each step is. Use tools that help you move around (mobility aids) if they are needed. These include: Canes. Walkers. Scooters. Crutches. Turn on the lights when you go into a dark area. Replace any light bulbs as soon as they burn out. Set up your furniture so you have a clear path. Avoid moving your furniture around. If any of your floors are uneven, fix them. If there are any pets around you, be aware of where they are. Review your medicines with your doctor. Some medicines can make you feel dizzy. This can increase your chance of falling. Ask your doctor what other things that you can do to help prevent falls. This information is not intended to replace advice given to you by your health care provider. Make sure you discuss any questions you have with your health care provider. Document Released: 08/18/2009 Document Revised: 03/29/2016 Document Reviewed: 11/26/2014 Elsevier Interactive Patient Education  2017 [employer].

## 2022-11-27 ENCOUNTER — Other Ambulatory Visit
Admission: RE | Admit: 2022-11-27 | Discharge: 2022-11-27 | Disposition: A | Discharge status: Home / Self Care | Payer: Medicare Other | Source: Ambulatory Visit | Attending: Psychiatry | Admitting: Psychiatry

## 2022-11-27 DIAGNOSIS — Z79899 Other long term (current) drug therapy: Secondary | ICD-10-CM | POA: Diagnosis not present

## 2022-11-27 DIAGNOSIS — F3176 Bipolar disorder, in full remission, most recent episode depressed: Secondary | ICD-10-CM | POA: Diagnosis not present

## 2022-11-27 LAB — HEMOGLOBIN A1C
Hgb A1c MFr Bld: 5 % (ref 4.8–5.6)
Mean Plasma Glucose: 96.8 mg/dL

## 2022-12-07 ENCOUNTER — Telehealth: Payer: Self-pay | Admitting: Psychiatry

## 2022-12-07 DIAGNOSIS — Z0100 Encounter for examination of eyes and vision without abnormal findings: Secondary | ICD-10-CM | POA: Diagnosis not present

## 2022-12-07 DIAGNOSIS — H524 Presbyopia: Secondary | ICD-10-CM | POA: Diagnosis not present

## 2022-12-07 DIAGNOSIS — F3176 Bipolar disorder, in full remission, most recent episode depressed: Secondary | ICD-10-CM

## 2022-12-07 MED ORDER — LITHIUM CARBONATE ER 300 MG PO TBCR: 600.0000 mg | EXTENDED_RELEASE_TABLET | Freq: Every day | ORAL | 2 refills | Status: DC

## 2022-12-07 NOTE — Telephone Encounter (Signed)
Received request from pharmacy at [facility] that lithium carbonate 300 mg tablet-Amy Mcconnell is on backorder and they would like to change to a new manufacturer.  Attempted to contact patient to discuss had to leave a voicemail.

## 2022-12-10 NOTE — Telephone Encounter (Signed)
Medication management - Message left for patient that Dr. Shea Evans had sent in her new Lithium Carbonate (Lithobid) 300 mg for patient to take 2 at bedtime. Patient called right back and stated understanding instructions. She will call back if any issues filling and taking the medication as ordered.

## 2022-12-10 NOTE — Telephone Encounter (Signed)
I already sent lithium new prescription to pharmacy with  instruction to pharmacist that it is okay to change to a new brand if patient agrees with that.  I just wanted her to be aware as well and that is why I tried to call her that day.  However please let patient know new order has been sent to pharmacy and she can check with her pharmacist.

## 2022-12-10 NOTE — Telephone Encounter (Signed)
Medication management - Patient left a message Friday afternoon she was trying to call Dr. Shea Evans back to dsicuss medication due to back order of Lithium. Patient requested provider call back if needed to discuss.

## 2023-01-01 ENCOUNTER — Telehealth: Payer: Self-pay | Admitting: Psychiatry

## 2023-01-01 NOTE — Telephone Encounter (Signed)
Patient stopped by the office asking if there is any medication that she is currently on from our office, that can prevent her from donating blood Will someone review and let her know. Thank you

## 2023-01-04 ENCOUNTER — Telehealth: Payer: Self-pay | Admitting: Cardiovascular Disease

## 2023-01-04 NOTE — Telephone Encounter (Signed)
Patient came by office States that insurance has denied doxazosin 2 MG refill Patient would like to discuss going from 2 MG back to 4 MG  Please call to discuss

## 2023-01-04 NOTE — Telephone Encounter (Signed)
Spoke with pt. Pt stated when she attempted to pick up her prescription for cardura 2 mg BID, she was informed medication would not be covered by insurance until 3/11. Pt stated she paid for medication out of pocket but concerned this will be an issue monthly.  Nurse attempted to contact pharmacy. Left message to call back.

## 2023-01-08 NOTE — Telephone Encounter (Signed)
Pt stated she was able to obtain medication by paying out of pocket. Pt stated she believes issue was with her insurance. She stated she will contact office if she has an issue going forward.

## 2023-01-23 ENCOUNTER — Telehealth: Payer: Self-pay | Admitting: Family Medicine

## 2023-01-23 NOTE — Telephone Encounter (Signed)
Noted.  She should be fine to use Voltaren gel over-the-counter.

## 2023-01-23 NOTE — Telephone Encounter (Signed)
FYI: I spoke with patient after reviewing chart. Voltaren Gel was mentioned in the past OTC to try. She states that she wasn't sure if there were any contraindications to using it. I told her she could check with the pharmacy.  Pt verbalized understanding. She is also aware that she would need an appt to discuss trying any other medication.

## 2023-01-23 NOTE — Telephone Encounter (Signed)
Patient called and she states she has had a crack knuckle for a long time. She was told to take Tylenol only. Is there something else she could take for the pain in her fingers and hand?

## 2023-02-02 ENCOUNTER — Other Ambulatory Visit: Payer: Self-pay | Admitting: Psychiatry

## 2023-02-02 DIAGNOSIS — F3176 Bipolar disorder, in full remission, most recent episode depressed: Secondary | ICD-10-CM

## 2023-02-07 ENCOUNTER — Telehealth: Payer: Self-pay | Admitting: Psychiatry

## 2023-02-07 NOTE — Telephone Encounter (Signed)
Patient stopped by office to ask if she can take [place]. Please advise

## 2023-02-07 NOTE — Telephone Encounter (Signed)
Called to inform patient she stated that she did not want to take those risk so she will not take that and she also mentioned that she only takes Tylenol as needed and to tell you thank you for answering her question

## 2023-02-07 NOTE — Telephone Encounter (Signed)
There is some concern that ginkgo leaf extract might increase the risk of bruising and bleeding, and interactions with anticoagulants/antiplatelet . drugs cannot be ruled out. If you take Advil/motrin , others similar , Ginkgo might increase your bleeding risk.  It can also reduce the effect of certain medications that you may be on, like certain antidepressants, certain statins. Since its herbal supplement , we do not have a lot of data available.  Please let patient know.

## 2023-02-12 ENCOUNTER — Other Ambulatory Visit: Payer: Self-pay | Admitting: Family Medicine

## 2023-02-12 DIAGNOSIS — E785 Hyperlipidemia, unspecified: Secondary | ICD-10-CM

## 2023-03-07 ENCOUNTER — Telehealth: Payer: Self-pay | Admitting: Psychiatry

## 2023-03-07 ENCOUNTER — Other Ambulatory Visit
Admission: RE | Admit: 2023-03-07 | Discharge: 2023-03-07 | Disposition: A | Discharge status: Home / Self Care | Payer: Medicare Other | Attending: Psychiatry | Admitting: Psychiatry

## 2023-03-07 DIAGNOSIS — F3176 Bipolar disorder, in full remission, most recent episode depressed: Secondary | ICD-10-CM | POA: Insufficient documentation

## 2023-03-07 DIAGNOSIS — Z79899 Other long term (current) drug therapy: Secondary | ICD-10-CM | POA: Diagnosis not present

## 2023-03-07 LAB — PLATELET COUNT: Platelets: 135 10*3/uL — ABNORMAL LOW (ref 150–400)

## 2023-03-07 LAB — LITHIUM LEVEL: Lithium Lvl: 0.57 mmol/L — ABNORMAL LOW (ref 0.60–1.20)

## 2023-03-07 MED ORDER — OXCARBAZEPINE 300 MG PO TABS: ORAL_TABLET | ORAL | 3 refills | Status: DC

## 2023-03-07 NOTE — Telephone Encounter (Signed)
Contacted patient to discuss lithium level-agrees to continue the medication.  Patient with low platelet count-resulted 03/07/2023-135.  Unknown if Trileptal contributing to this.  Patient advised to contact primary care provider for further evaluation.  In the meantime patient could hold the Trileptal also.

## 2023-03-08 LAB — PROLACTIN: Prolactin: 26.1 ng/mL — ABNORMAL HIGH (ref 3.6–25.2)

## 2023-03-11 ENCOUNTER — Telehealth: Payer: Self-pay

## 2023-03-11 NOTE — Telephone Encounter (Signed)
pt left a message that you wanted her to see her provider before seeing her next. pt states that she wil see her doctor on the 8th.

## 2023-03-11 NOTE — Telephone Encounter (Signed)
Noted  

## 2023-03-13 ENCOUNTER — Ambulatory Visit (INDEPENDENT_AMBULATORY_CARE_PROVIDER_SITE_OTHER): Payer: Medicare Other | Admitting: Family Medicine

## 2023-03-13 ENCOUNTER — Encounter: Payer: Self-pay | Admitting: Family Medicine

## 2023-03-13 ENCOUNTER — Telehealth: Payer: Self-pay

## 2023-03-13 VITALS — BP 122/78 | HR 74 | Temp 98.1°F | Ht 67.0 in | Wt 133.6 lb

## 2023-03-13 DIAGNOSIS — I10 Essential (primary) hypertension: Secondary | ICD-10-CM | POA: Diagnosis not present

## 2023-03-13 DIAGNOSIS — M19042 Primary osteoarthritis, left hand: Secondary | ICD-10-CM | POA: Diagnosis not present

## 2023-03-13 DIAGNOSIS — D696 Thrombocytopenia, unspecified: Secondary | ICD-10-CM

## 2023-03-13 DIAGNOSIS — E782 Mixed hyperlipidemia: Secondary | ICD-10-CM

## 2023-03-13 DIAGNOSIS — L237 Allergic contact dermatitis due to plants, except food: Secondary | ICD-10-CM

## 2023-03-13 DIAGNOSIS — R7989 Other specified abnormal findings of blood chemistry: Secondary | ICD-10-CM

## 2023-03-13 DIAGNOSIS — E039 Hypothyroidism, unspecified: Secondary | ICD-10-CM

## 2023-03-13 NOTE — Addendum Note (Signed)
Addended by: Prince Solian A on: 03/13/2023 12:11 PM   Modules accepted: Orders

## 2023-03-13 NOTE — Assessment & Plan Note (Signed)
Chronic issue.  Well-controlled.  She will continue Cardura 2 mg twice daily.

## 2023-03-13 NOTE — Assessment & Plan Note (Signed)
Chronic issue.  Continue levothyroxine 125 mcg once daily.  Check TSH with upcoming labs.

## 2023-03-13 NOTE — Assessment & Plan Note (Signed)
Possibly medication side effect from her Trileptal.  Will recheck in 2 weeks.  Psychiatry mention having her stop the Trileptal though the patient is hesitant to do this.

## 2023-03-13 NOTE — Telephone Encounter (Signed)
Lab orders are changed to the hospital

## 2023-03-13 NOTE — Assessment & Plan Note (Signed)
Minimal elevation.  Possibly from her Geodon.  We will plan on checking her prolactin in 2 weeks.  I will communicate this plan with her psychiatrist.

## 2023-03-13 NOTE — Assessment & Plan Note (Signed)
I discussed that we could prescribe prednisone or topical steroid though she defers this at this time.  She will continue her over-the-counter symptom management.  If worsening or if she develops this in her genitals or on her face she will let us know.

## 2023-03-13 NOTE — Progress Notes (Signed)
Marikay Alar, MD Phone: [phone]  Amy Mcconnell is a 72 y.o. female who presents today for f/u.  HYPERTENSION Disease Monitoring Home BP Monitoring not checking Chest pressure- no    Dyspnea- no Medications Compliance-  taking cardura.   Edema- no BMET    Component Value Date/Time   NA 138 07/21/2022 1328   NA 143 11/23/2014 0000   NA 138 01/17/2014 0842   K 4.5 07/21/2022 1328   K 4.0 01/17/2014 0842   CL 107 07/21/2022 1328   CL 104 01/17/2014 0842   CO2 23 07/21/2022 1328   CO2 29 01/17/2014 0842   GLUCOSE 105 (H) 07/21/2022 1328   GLUCOSE 84 01/17/2014 0842   BUN 26 (H) 07/21/2022 1328   BUN 26 01/08/2022 0950   BUN 22 (H) 01/17/2014 0842   CREATININE 0.94 07/21/2022 1328   CREATININE 0.74 01/17/2014 0842   CALCIUM 9.6 07/21/2022 1328   CALCIUM 9.4 01/17/2014 0842   GFRNONAA >60 07/21/2022 1328   GFRNONAA >60 01/17/2014 0842   GFRAA >60 04/14/2019 1631   GFRAA >60 01/17/2014 0842   Chest pain: Patient notes an occasional left upper chest sharp momentary discomfort that resolves with taking Tylenol.  She has been doing a lot of work outside in her yard.  HYPOTHYROIDISM Disease Monitoring Weight changes: no  Skin Changes: no Heat/Cold intolerance: no  Medication Monitoring Compliance:  taking synthroid   Last TSH:   Lab Results  Component Value Date   TSH 1.82 08/27/2022   HYPERLIPIDEMIA Symptoms Chest pain on exertion:  no  Medications: Compliance- taking lipitor Right upper quadrant pain- no  Muscle aches- no Lipid Panel     Component Value Date/Time   CHOL 179 07/21/2022 1328   TRIG 63 07/21/2022 1328   HDL 75 07/21/2022 1328   CHOLHDL 2.4 07/21/2022 1328   VLDL 13 07/21/2022 1328   LDLCALC 91 07/21/2022 1328   LDLDIRECT 94 11/24/2021 1426   Poison ivy: Patient notes she got into poison ivy last week.  Its on both of her arms and near her left axilla.  She has been using Tacni which helps with itching.  Chronic hand pain: Patient has  arthritis in her hands.  She notes at some point she was advised to try capsaicin ointment though she is afraid to use this and is afraid that it could get on her cats.  She does take Tylenol with good benefit for her hands.  Patient recently saw her psychiatrist and had lab work.  Her prolactin was minimally elevated.  Her platelets were minimally low.  Psychiatry advised her to follow-up with me on these things.  Social History   Tobacco Use  Smoking Status Former   Packs/day: 0.50   Years: 13.00   Additional pack years: 0.00   Total pack years: 6.50   Types: Cigarettes   Quit date: 01/23/2000   Years since quitting: 23.1  Smokeless Tobacco Never    Current Outpatient Medications on File Prior to Visit  Medication Sig Dispense Refill   acetaminophen (TYLENOL) 325 MG tablet Take 650 mg by mouth every 6 (six) hours as needed.     Ascorbic Acid (VITAMIN C) 1000 MG tablet Take 1,000 mg by mouth daily.     atorvastatin (LIPITOR) 20 MG tablet Take 1 tablet by mouth once daily 90 tablet 3   B Complex Vitamins (VITAMIN B COMPLEX PO) Take 1 tablet by mouth daily.     Bacillus Coagulans-Inulin (ALIGN PREBIOTIC-PROBIOTIC PO) Take by mouth.  Biotin 5 MG CAPS Take 5 mg by mouth daily.     calcium carbonate (OS-CAL) 600 MG TABS tablet Take 600 mg by mouth daily.      Cholecalciferol (VITAMIN D3) 50 MCG (2000 UT) capsule Take 2,000 Units by mouth daily.     diphenhydrAMINE (BENADRYL) 25 mg capsule Take 25 mg by mouth daily as needed for allergies.     docusate sodium (COLACE) 100 MG capsule Take 100 mg by mouth daily.     doxazosin (CARDURA) 2 MG tablet Take 1 tablet (2 mg total) by mouth 2 (two) times daily. May take extra 2 mg daily as needed for BP> 160 180 tablet 2   EPINEPHrine (EPIPEN 2-PAK) 0.3 mg/0.3 mL IJ SOAJ injection Inject 0.3 mg into the muscle as needed for anaphylaxis. 2 each 0   fexofenadine (ALLEGRA) 180 MG tablet Take 180 mg by mouth daily as needed for allergies.      Flaxseed, Linseed, (FLAXSEED OIL PO) Take 1 capsule by mouth daily.     levothyroxine (SYNTHROID) 125 MCG tablet TAKE 1 TABLET BY MOUTH ONCE DAILY BEFORE BREAKFAST 90 tablet 3   lithium carbonate (LITHOBID) 300 MG ER tablet Take 2 tablets (600 mg total) by mouth at bedtime. 60 tablet 2   magnesium oxide (MAG-OX) 400 MG tablet Take 400 mg by mouth daily.     Multiple Vitamins-Minerals (MULTIVITAMIN WITH MINERALS) tablet Take 1 tablet by mouth daily.     Multiple Vitamins-Minerals (VISION FORMULA/LUTEIN) TABS Take 1 tablet by mouth in the morning and at bedtime.     Omega-3 Fatty Acids (FISH OIL) 1000 MG CAPS Take 3,000 mg by mouth daily.     Oxcarbazepine (TRILEPTAL) 300 MG tablet HOLD TRILEPTAL 90 tablet 3   vitamin B-12 (CYANOCOBALAMIN) 1000 MCG tablet Take 1,000 mcg by mouth daily.     vitamin E 1000 UNIT capsule Take 1,000 Units by mouth daily.     zinc gluconate 50 MG tablet Take 50 mg by mouth daily.     ziprasidone (GEODON) 20 MG capsule TAKE 1 CAPSULE BY MOUTH ONCE DAILY WITH SUPPER 30 capsule 2   No current facility-administered medications on file prior to visit.     ROS see history of present illness  Objective  Physical Exam Vitals:   03/13/23 1018  BP: 122/78  Pulse: 74  Temp: 98.1 F (36.7 C)  SpO2: 96%    BP Readings from Last 3 Encounters:  03/13/23 122/78  11/06/22 124/76  09/07/22 110/60   Wt Readings from Last 3 Encounters:  03/13/23 133 lb 9.6 oz (60.6 kg)  11/16/22 134 lb (60.8 kg)  11/06/22 134 lb 6.4 oz (61 kg)    Physical Exam Constitutional:      General: She is not in acute distress.    Appearance: She is not diaphoretic.  Cardiovascular:     Rate and Rhythm: Normal rate and regular rhythm.     Heart sounds: Normal heart sounds.  Pulmonary:     Effort: Pulmonary effort is normal.     Breath sounds: Normal breath sounds.  Skin:    General: Skin is warm and dry.     Comments: Erythematous rash at left antecubital fossa, near left axilla,  and on right forearm  Neurological:     Mental Status: She is alert.      Assessment/Plan: Please see individual problem list.  Essential hypertension Assessment & Plan: Chronic issue.  Well-controlled.  She will continue Cardura 2 mg twice daily.   Acquired  hypothyroidism Assessment & Plan: Chronic issue.  Continue levothyroxine 125 mcg once daily.  Check TSH with upcoming labs.  Orders: -     TSH; Future  Osteoarthritis of left hand, unspecified osteoarthritis type Assessment & Plan: Chronic issue.  Patient has arthritis in her hands.  She can continue Tylenol.  She is hesitant to use capsaicin.   Mixed hyperlipidemia Assessment & Plan: Chronic issue.  Well-controlled.  She will continue Lipitor 20 mg once daily.   Poison ivy Assessment & Plan: I discussed that we could prescribe prednisone or topical steroid though she defers this at this time.  She will continue her over-the-counter symptom management.  If worsening or if she develops this in her genitals or on her face she will let us know.   Thrombocytopenia (HCC) Assessment & Plan: Possibly medication side effect from her Trileptal.  Will recheck in 2 weeks.  Psychiatry mention having her stop the Trileptal though the patient is hesitant to do this.  Orders: -     CBC; Future  Elevated prolactin level Assessment & Plan: Minimal elevation.  Possibly from her Geodon.  We will plan on checking her prolactin in 2 weeks.  I will communicate this plan with her psychiatrist.  Orders: -     Prolactin; Future    Return in about 2 weeks (around 03/27/2023) for Labs, 6 months with PCP.   Marikay Alar, MD [facility]

## 2023-03-13 NOTE — Assessment & Plan Note (Signed)
Chronic issue.  Well-controlled.  She will continue Lipitor 20 mg once daily.

## 2023-03-13 NOTE — Patient Instructions (Signed)
Nice to see you. We will get lab work in 2 weeks. If your poison ivy is not improving or if it worsens at all please let us know.

## 2023-03-13 NOTE — Assessment & Plan Note (Signed)
Chronic issue.  Patient has arthritis in her hands.  She can continue Tylenol.  She is hesitant to use capsaicin.

## 2023-03-13 NOTE — Telephone Encounter (Signed)
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

## 2023-03-15 ENCOUNTER — Ambulatory Visit (INDEPENDENT_AMBULATORY_CARE_PROVIDER_SITE_OTHER): Payer: Medicare Other | Admitting: Psychiatry

## 2023-03-15 ENCOUNTER — Telehealth: Payer: Self-pay | Admitting: Family Medicine

## 2023-03-15 ENCOUNTER — Encounter: Payer: Self-pay | Admitting: Psychiatry

## 2023-03-15 VITALS — BP 141/76 | HR 84 | Temp 97.2°F | Ht 67.0 in | Wt 136.4 lb

## 2023-03-15 DIAGNOSIS — F5101 Primary insomnia: Secondary | ICD-10-CM | POA: Insufficient documentation

## 2023-03-15 DIAGNOSIS — F3176 Bipolar disorder, in full remission, most recent episode depressed: Secondary | ICD-10-CM | POA: Diagnosis not present

## 2023-03-15 DIAGNOSIS — Z79899 Other long term (current) drug therapy: Secondary | ICD-10-CM | POA: Diagnosis not present

## 2023-03-15 DIAGNOSIS — T50905A Adverse effect of unspecified drugs, medicaments and biological substances, initial encounter: Secondary | ICD-10-CM | POA: Insufficient documentation

## 2023-03-15 NOTE — Progress Notes (Signed)
BH MD OP Progress Note  03/15/2023 11:12 AM Amy Mcconnell  MRN:  [id number]  Chief Complaint:  Chief Complaint  Patient presents with   Follow-up   Anxiety   Medication Refill   Depression   HPI: Amy Mcconnell is a 72 year old Caucasian female who is currently divorced, lives in [place], has a history of bipolar disorder, primary insomnia, including essential hypertension, tachycardia, hyperlipidemia, heart murmur, thyroid abnormalities was evaluated in office today.  Patient with recent lab abnormalities, thrombocytopenia as well as elevated prolactin level-as per review of labs on 03/07/2023.  Patient at that time was advised to hold the Trileptal and to follow up with primary care provider Dr. Marikay Mcconnell for further workup as needed.  I have coordinated care with Dr. Birdie Mcconnell who will repeat labs in 2 weeks.   Patient today appeared to be frustrated, mildly irritable.  She reports she had an encounter with a colleague as she was walking into this office which kind of triggered her.  She however reports she is coping well and that it is just situational that she feels this way.  Patient reports she was not able to hold the Trileptal as recommended since she was worried about doing it.  In the past she has noticed that even when she misses a day of Trileptal she has cramps of her lower extremities.  She hence is worried about that and hence continued to take it.  Patient otherwise reports no concerns.  Denies any significant mood swings.  Denies any sadness or anxiety.  Reports sleep is good.  She sleeps from 11 PM to 6:30 AM.  She has a good sleep hygiene.  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  She reports there are days that she works she feels tired at the end of the day and takes a nap which helps.  She does have good support from her brother.  Denies any suicidality, homicidality or perceptual disturbances.  Patient reports she was recently pulling  out weeds in her garden and got poison ivy.  She reports she is going to start a course of prednisone.  She is currently taking Tecnu topical over-the-counter.  That does seem to help.  She continues to have rashes over her left upper extremities.  Patient denies any other concerns today.  Visit Diagnosis:    ICD-10-CM   1. Bipolar disorder, in full remission, most recent episode depressed (HCC)  F31.76     2. Primary insomnia  F51.01     3. Adverse effect of drug, initial encounter  T50.905A     4. High risk medication use  Z79.899       Past Psychiatric History: I have reviewed past psychiatric history from progress note on 01/04/2022.  Past trials of medications-multiple.  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 Alycia Mcconnell - every 3 months   Broken foot    left    Contact dermatitis and other eczema due to other specified agent    Contusion of unspecified part of lower limb    Essential hypertension, benign    Heart murmur    Other and unspecified hyperlipidemia    Other specified disorders of thyroid    Reflux esophagitis    Scalp laceration 04/13/2019   Tachycardia    a. isolated episode, seen  in ED with negative work up    Past Surgical History:  Procedure Laterality Date   CATARACT EXTRACTION     COLONOSCOPY WITH PROPOFOL N/A 03/27/2022   Procedure: COLONOSCOPY WITH PROPOFOL;  Surgeon: Amy Minium, MD;  Location[facility] ENDOSCOPY;  Service: Endoscopy;  Laterality: N/A;   hamertoe     HAMMER TOE SURGERY  11/30/2021   right 4th toe 11/10/21 Amy Mcconnell   SALPINGECTOMY Left    STERILIZATION     Her decision since dz of bipolar    Family Psychiatric History: I have reviewed family psychiatric history from progress note on 01/04/2022.  Family History:  Family History  Problem Relation Age of Onset   Arrhythmia Mother        A-Fib    Heart failure Mother    Hyperlipidemia Mother    Hypertension Mother    Alzheimer's disease Mother    Hypertension Father    Hyperlipidemia Father    Mental retardation Father 48       Suicide   Hypertension Brother    Hyperlipidemia Brother    Hypertension Brother    Hyperlipidemia Brother    Asthma Brother    COPD Brother    Hyperlipidemia Brother    Hypertension Brother    Alcohol abuse Brother    Depression Brother    Emphysema Brother    Hyperlipidemia Brother    Hypertension Brother    Breast cancer Maternal Aunt    Bipolar disorder Paternal Uncle    Bipolar disorder Paternal Uncle    Bipolar disorder Paternal Uncle    Bipolar disorder Paternal Grandmother     Social History: I have reviewed social history from progress note on 01/04/2022. Social History   Socioeconomic History   Marital status: Divorced    Spouse name: Not on file   Number of children: 0   Years of education: 14   Highest education level: Not on file  Occupational History   Not on file  Tobacco Use   Smoking status: Former    Packs/day: 0.50    Years: 13.00    Additional pack years: 0.00    Total pack years: 6.50    Types: Cigarettes    Quit date: 01/23/2000    Years since quitting: 23.1   Smokeless tobacco: Never  Vaping Use   Vaping Use: Never used  Substance and Sexual Activity   Alcohol use: No   Drug use: No   Sexual activity: Not Currently  Other Topics Concern   Not on file  Social History Narrative   Amy Mcconnell grew up in [place], [place]. She lives in [place]. [name] is divorced. She has 2 cats (Charlie and Angie). 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 She also volunteers 2 days at [facility]. She enjoys reading, garding and cooking.   Social Determinants of Health   Financial Resource Strain: Low Risk  (11/16/2022)   Overall Financial Resource Strain (CARDIA)    Difficulty of Paying Living Expenses: Not hard at all  Food Insecurity: No Food Insecurity  (11/16/2022)   Hunger Vital Sign    Worried About Running Out of Food in the Last Year: Never true    Ran Out of Food in the Last Year: Never true  Transportation Needs: No Transportation Needs (11/16/2022)   PRAPARE - Administrator, Civil Service (Medical): No    Lack of Transportation (Non-Medical): No  Physical Activity: Sufficiently Active (11/16/2022)   Exercise Vital Sign    Days  of Exercise per Week: 5 days    Minutes of Exercise per Session: 30 min  Stress: No Stress Concern Present (11/16/2022)   [employer] of Occupational Health - Occupational Stress Questionnaire    Feeling of Stress : Not at all  Social Connections: Moderately Isolated (11/16/2022)   Social Connection and Isolation Panel [NHANES]    Frequency of Communication with Friends and Family: More than three times a week    Frequency of Social Gatherings with Friends and Family: Once a week    Attends Religious Services: Never    Database administrator or Organizations: Yes    Attends Banker Meetings: Never    Marital Status: Divorced    Allergies:  Allergies  Allergen Reactions   Apple Juice Anaphylaxis   Ace Inhibitors     Angioedema    Angiotensin Receptor Blockers     Angioedema    Benicar [Olmesartan]     Mouth & Lip Swelling, "I swell from the waist down". Throat swelling   Poison Ivy Extract [Poison Ivy Extract]     Blisters   Purell Instant Hand [Alcohol] Swelling   Triclosan     Metabolic Disorder Labs: Lab Results  Component Value Date   HGBA1C 5.0 11/27/2022   MPG 96.8 11/27/2022   Lab Results  Component Value Date   PROLACTIN 26.1 (H) 03/07/2023   Lab Results  Component Value Date   CHOL 179 07/21/2022   TRIG 63 07/21/2022   HDL 75 07/21/2022   CHOLHDL 2.4 07/21/2022   VLDL 13 07/21/2022   LDLCALC 91 07/21/2022   LDLCALC 58 06/23/2021   Lab Results  Component Value Date   TSH 1.82 08/27/2022   TSH 1.540 01/08/2022    Therapeutic Level  Labs: Lab Results  Component Value Date   LITHIUM 0.57 (L) 03/07/2023   LITHIUM 0.49 (L) 07/21/2022   No results found for: "VALPROATE" No results found for: "CBMZ"  Current Medications: Current Outpatient Medications  Medication Sig Dispense Refill   acetaminophen (TYLENOL) 325 MG tablet Take 650 mg by mouth every 6 (six) hours as needed.     Ascorbic Acid (VITAMIN C) 1000 MG tablet Take 1,000 mg by mouth daily.     atorvastatin (LIPITOR) 20 MG tablet Take 1 tablet by mouth once daily 90 tablet 3   B Complex Vitamins (VITAMIN B COMPLEX PO) Take 1 tablet by mouth daily.     Bacillus Coagulans-Inulin (ALIGN PREBIOTIC-PROBIOTIC PO) Take by mouth.     Biotin 5 MG CAPS Take 5 mg by mouth daily.     calcium carbonate (OS-CAL) 600 MG TABS tablet Take 600 mg by mouth daily.      Cholecalciferol (VITAMIN D3) 50 MCG (2000 UT) capsule Take 2,000 Units by mouth daily.     diphenhydrAMINE (BENADRYL) 25 mg capsule Take 25 mg by mouth daily as needed for allergies.     docusate sodium (COLACE) 100 MG capsule Take 100 mg by mouth daily.     doxazosin (CARDURA) 2 MG tablet Take 1 tablet (2 mg total) by mouth 2 (two) times daily. May take extra 2 mg daily as needed for BP> 160 180 tablet 2   EPINEPHrine (EPIPEN 2-PAK) 0.3 mg/0.3 mL IJ SOAJ injection Inject 0.3 mg into the muscle as needed for anaphylaxis. 2 each 0   fexofenadine (ALLEGRA) 180 MG tablet Take 180 mg by mouth daily as needed for allergies.     Flaxseed, Linseed, (FLAXSEED OIL PO) Take 1 capsule by mouth  daily.     levothyroxine (SYNTHROID) 125 MCG tablet TAKE 1 TABLET BY MOUTH ONCE DAILY BEFORE BREAKFAST 90 tablet 3   lithium carbonate (LITHOBID) 300 MG ER tablet Take 2 tablets (600 mg total) by mouth at bedtime. 60 tablet 2   magnesium oxide (MAG-OX) 400 MG tablet Take 400 mg by mouth daily.     Multiple Vitamins-Minerals (MULTIVITAMIN WITH MINERALS) tablet Take 1 tablet by mouth daily.     Multiple Vitamins-Minerals (VISION  FORMULA/LUTEIN) TABS Take 1 tablet by mouth in the morning and at bedtime.     Omega-3 Fatty Acids (FISH OIL) 1000 MG CAPS Take 3,000 mg by mouth daily.     Oxcarbazepine (TRILEPTAL) 300 MG tablet HOLD TRILEPTAL (Patient taking differently: 600 mg. Continue until repeat lab work) 90 tablet 3   vitamin B-12 (CYANOCOBALAMIN) 1000 MCG tablet Take 1,000 mcg by mouth daily.     vitamin E 1000 UNIT capsule Take 1,000 Units by mouth daily.     zinc gluconate 50 MG tablet Take 50 mg by mouth daily.     ziprasidone (GEODON) 20 MG capsule TAKE 1 CAPSULE BY MOUTH ONCE DAILY WITH SUPPER 30 capsule 2   No current facility-administered medications for this visit.     Musculoskeletal: Strength & Muscle Tone: within normal limits Gait & Station: normal Patient leans: N/A  Psychiatric Specialty Exam: Review of Systems  Skin:        Skin rash- likely poison ivy - left upper extremity  Psychiatric/Behavioral:         Frustrated although managing well-situational  All other systems reviewed and are negative.   Blood pressure (!) 141/76, pulse 84, temperature (!) 97.2 F (36.2 C), temperature source Skin, height 5\' 7"  (1.702 m), weight 136 lb 6.4 oz (61.9 kg).Body mass index is 21.36 kg/m.  General Appearance: Casual  Eye Contact:  Fair  Speech:  Clear and Coherent  Volume:  Normal  Mood:   Frustrated although coping okay  Affect:  Appropriate  Thought Process:  Goal Directed and Descriptions of Associations: Intact  Orientation:  Full (Time, Place, and Person)  Thought Content: Logical   Suicidal Thoughts:  No  Homicidal Thoughts:  No  Memory:  Immediate;   Fair Recent;   Fair Remote;   Fair  Judgement:  Fair  Insight:  Fair  Psychomotor Activity:  Normal  Concentration:  Concentration: Fair and Attention Span: Fair  Recall:  [employer] of Knowledge: Fair  Language: Fair  Akathisia:  No  Handed:  Right  AIMS (if indicated): not done  Assets:  Communication Skills Desire for  Improvement Housing Social Support  ADL's:  Intact  Cognition: WNL  Sleep:  Fair   Screenings: Midwife Visit from 11/06/2022 in [facility] Office Visit from 04/09/2022 in [facility] Office Visit from 01/04/2022 in [facility]  AIMS Total Score 0 0 0      GAD-7    Flowsheet Row Office Visit from 03/15/2023 in [facility] Office Visit from 03/13/2023 in [facility] [employer] Visit from 11/06/2022 in [facility] Office Visit from 01/04/2022 in [facility]  Total GAD-7 Score 0 0 0 2      Mini-Mental    Flowsheet Row Clinical Support from 11/06/2017 in [facility] [employer]  Score (max 30 points ) 30      PHQ2-9    Flowsheet Row Office Visit from 03/15/2023 in [facility] Office Visit from 03/13/2023 in [facility] Clinical Support from 11/16/2022 in [facility] [employer] Visit from 11/06/2022 in [facility] Office Visit from 09/07/2022 in [facility] Total Score 0 0 0 0 0  PHQ-9 Total Score 0 0 -- 0 --      Flowsheet Row Office Visit from 03/15/2023 in [facility] Office Visit from 11/06/2022 in [facility] Video Visit from 07/13/2022 in [facility]  C-SSRS RISK CATEGORY No Risk No Risk Low Risk        Assessment and Plan: TENILLE ARMENTEROS is a 72 year old Caucasian female who has a history of bipolar disorder, multiple medical problems was  evaluated in office today.  Patient with recent poison ivy currently undergoing treatment per primary care provider as well as recent abnormal labs likely due to current medications like Geodon, Trileptal, will benefit from the following plan.  Plan Bipolar disorder in remission Geodon 20 mg p.o. daily with supper Lithium 600 mg p.o. daily at bedtime Trileptal 600 mg p.o. nightly.  Patient reports she would like to stay on this dosage for now.  She is worried about withdrawal symptoms and would like to wait until repeat labs are drawn in 2 weeks.  Patient agrees to monitor for any side effects, given the bleeding risk, platelet count being low recently.  Primary insomnia-stable Continue sleep hygiene techniques  Adverse effect of medication-unstable Patient prefers to stay on the Trileptal for now.  Patient to have repeat labs in 2 weeks.  And patient to follow-up right after that with this provider for further medication management and possibly tapering off Trileptal if needed.  High risk medication use-I have reviewed labs including lithium level 0.57-therapeutic 03/07/2023, prolactin level-26.1 and slightly elevated likely due to Geodon, platelet count-1 35-trending low. Patient to have repeat labs including prolactin, platelet count on 03/27/2023. Patient to follow up with this provider on 03/27/2023 for further management as needed.    Collaboration of Care: Collaboration of Care: Other I have collaborated care with Dr. Josie Saunders care provider who will repeat labs in 2 weeks.  Patient/Guardian was advised Release of Information must be obtained prior to any record release in order to collaborate their care with an outside provider. Patient/Guardian was advised if they have not already done so to contact the registration department to sign all necessary forms in order for [place] to release information regarding their care.   Consent: Patient/Guardian gives verbal consent for treatment  and assignment of benefits for services provided during this visit. Patient/Guardian expressed understanding and agreed to proceed.   Follow-up in clinic in 10 days to 2 weeks or sooner if needed.  This note was generated in part or whole with voice recognition software. Voice recognition is usually quite accurate but there are transcription errors that can and very often do occur. I apologize for any typographical errors that were not detected and corrected.    Jomarie Longs, MD 03/15/2023, 11:12 AM

## 2023-03-15 NOTE — Telephone Encounter (Signed)
Pt would like to be called in a steroid because she has poison ivy everywhere

## 2023-03-18 MED ORDER — PREDNISONE 10 MG PO TABS: ORAL_TABLET | ORAL | 0 refills | Status: AC

## 2023-03-18 NOTE — Telephone Encounter (Signed)
I sent the steroid taper in for her.  Please make sure that she still has issues with the poison ivy.  If she is not having issues then she does not need to take the steroid.

## 2023-03-18 NOTE — Addendum Note (Signed)
Addended by: Birdie Sons Oluwatomisin Hustead G on: 03/18/2023 11:44 AM   Modules accepted: Orders

## 2023-03-18 NOTE — Telephone Encounter (Signed)
Spoke to Patient the poison ivy is clearing up and she has bad side effects of Prednisone so Patient is going to call the Pharmacy and tell them to put the Prednisone back on the shelf.

## 2023-03-27 ENCOUNTER — Telehealth: Payer: BC Managed Care – PPO | Admitting: Psychiatry

## 2023-03-28 ENCOUNTER — Other Ambulatory Visit
Admission: RE | Admit: 2023-03-28 | Discharge: 2023-03-28 | Disposition: A | Discharge status: Home / Self Care | Payer: Medicare Other | Attending: Family Medicine | Admitting: Family Medicine

## 2023-03-28 ENCOUNTER — Telehealth: Payer: Self-pay

## 2023-03-28 DIAGNOSIS — D696 Thrombocytopenia, unspecified: Secondary | ICD-10-CM | POA: Insufficient documentation

## 2023-03-28 DIAGNOSIS — E039 Hypothyroidism, unspecified: Secondary | ICD-10-CM | POA: Diagnosis not present

## 2023-03-28 DIAGNOSIS — R7989 Other specified abnormal findings of blood chemistry: Secondary | ICD-10-CM | POA: Insufficient documentation

## 2023-03-28 LAB — CBC
HCT: 38.9 % (ref 36.0–46.0)
Hemoglobin: 12.4 g/dL (ref 12.0–15.0)
MCH: 27 pg (ref 26.0–34.0)
MCHC: 31.9 g/dL (ref 30.0–36.0)
MCV: 84.7 fL (ref 80.0–100.0)
Platelets: 147 10*3/uL — ABNORMAL LOW (ref 150–400)
RBC: 4.59 MIL/uL (ref 3.87–5.11)
RDW: 13.9 % (ref 11.5–15.5)
WBC: 4.5 10*3/uL (ref 4.0–10.5)
nRBC: 0 % (ref 0.0–0.2)

## 2023-03-28 LAB — TSH: TSH: <0.01 u[IU]/mL — ABNORMAL LOW (ref 0.350–4.500)

## 2023-03-28 NOTE — Telephone Encounter (Signed)
Left message to call the office back regarding lab results  

## 2023-03-28 NOTE — Addendum Note (Signed)
Addended by: Yeilyn Gent C on: 03/28/2023 07:06 AM   Modules accepted: Orders  

## 2023-03-28 NOTE — Addendum Note (Signed)
Addended by: Lonell Face C on: 03/28/2023 07:06 AM   Modules accepted: Orders

## 2023-03-28 NOTE — Addendum Note (Signed)
Addended by: Adoni Greenough C on: 03/28/2023 07:06 AM   Modules accepted: Orders  

## 2023-03-29 ENCOUNTER — Other Ambulatory Visit: Payer: Self-pay | Admitting: Family Medicine

## 2023-03-29 DIAGNOSIS — E039 Hypothyroidism, unspecified: Secondary | ICD-10-CM

## 2023-03-29 LAB — PROLACTIN: Prolactin: 17.5 ng/mL (ref 3.6–25.2)

## 2023-03-29 NOTE — Addendum Note (Signed)
Addended by: Glori Luis on: 03/29/2023 01:47 PM   Modules accepted: Orders

## 2023-04-05 ENCOUNTER — Other Ambulatory Visit
Admission: RE | Admit: 2023-04-05 | Discharge: 2023-04-05 | Disposition: A | Discharge status: Home / Self Care | Payer: Medicare Other | Attending: Family Medicine | Admitting: Family Medicine

## 2023-04-05 ENCOUNTER — Other Ambulatory Visit: Payer: Self-pay | Admitting: Psychiatry

## 2023-04-05 ENCOUNTER — Telehealth: Payer: BC Managed Care – PPO | Admitting: Psychiatry

## 2023-04-05 ENCOUNTER — Other Ambulatory Visit: Payer: Self-pay | Admitting: Family Medicine

## 2023-04-05 DIAGNOSIS — F3176 Bipolar disorder, in full remission, most recent episode depressed: Secondary | ICD-10-CM

## 2023-04-05 DIAGNOSIS — E039 Hypothyroidism, unspecified: Secondary | ICD-10-CM | POA: Diagnosis not present

## 2023-04-05 LAB — TSH: TSH: <0.01 u[IU]/mL — ABNORMAL LOW (ref 0.350–4.500)

## 2023-04-05 MED ORDER — LEVOTHYROXINE SODIUM 100 MCG PO TABS: 100.0000 ug | ORAL_TABLET | Freq: Every day | ORAL | 1 refills | Status: DC

## 2023-04-17 ENCOUNTER — Telehealth: Payer: Self-pay | Admitting: Family Medicine

## 2023-04-22 ENCOUNTER — Telehealth: Payer: Self-pay | Admitting: Family Medicine

## 2023-04-22 NOTE — Telephone Encounter (Signed)
I have attached the same image & information below. Pt wants to know if this supplement would be a good choice for her.

## 2023-04-22 NOTE — Telephone Encounter (Signed)
Patient dropped off a picture of a supplement she would like to take. Label is up front I color folder. She would like Dr Birdie Sons to review to see what he thinks about taking it. Patient is aware that Dr Birdie Sons will not be in office until Wednesday.

## 2023-04-24 NOTE — Telephone Encounter (Signed)
I would not recommend using this supplement as we do not know how it will interact with her other medications as this type of supplement is not studied well.

## 2023-04-24 NOTE — Telephone Encounter (Signed)
Pt is aware and gave a verbal understanding.  

## 2023-05-06 ENCOUNTER — Other Ambulatory Visit: Payer: Self-pay

## 2023-05-06 ENCOUNTER — Emergency Department: Payer: Medicare Other

## 2023-05-06 ENCOUNTER — Emergency Department
Admission: EM | Admit: 2023-05-06 | Discharge: 2023-05-06 | Disposition: A | Discharge status: Home / Self Care | Payer: Medicare Other | Attending: Emergency Medicine | Admitting: Emergency Medicine

## 2023-05-06 ENCOUNTER — Encounter: Payer: Self-pay | Admitting: Emergency Medicine

## 2023-05-06 DIAGNOSIS — M47812 Spondylosis without myelopathy or radiculopathy, cervical region: Secondary | ICD-10-CM | POA: Diagnosis not present

## 2023-05-06 DIAGNOSIS — S0990XA Unspecified injury of head, initial encounter: Secondary | ICD-10-CM | POA: Diagnosis not present

## 2023-05-06 DIAGNOSIS — W01198A Fall on same level from slipping, tripping and stumbling with subsequent striking against other object, initial encounter: Secondary | ICD-10-CM | POA: Diagnosis not present

## 2023-05-06 DIAGNOSIS — Y92009 Unspecified place in unspecified non-institutional (private) residence as the place of occurrence of the external cause: Secondary | ICD-10-CM | POA: Diagnosis not present

## 2023-05-06 DIAGNOSIS — S0003XA Contusion of scalp, initial encounter: Secondary | ICD-10-CM

## 2023-05-06 DIAGNOSIS — S199XXA Unspecified injury of neck, initial encounter: Secondary | ICD-10-CM | POA: Diagnosis not present

## 2023-05-06 NOTE — Discharge Instructions (Signed)
Follow-up with your primary care provider if any continued problems or concerns.  Return to the emergency department if any severe worsening of your symptoms such as development of a headache, changes in vision, severe nausea and vomiting.  Apply ice packs to your scalp as needed for the soft tissue swelling that you have.  You may take Tylenol sparingly as needed for pain.

## 2023-05-06 NOTE — ED Notes (Signed)
Pt in CT.

## 2023-05-06 NOTE — ED Triage Notes (Signed)
Patient to ED via POV for a fall. Patient states she was walking on the porch and slipped and fell last PM. Pt states she has a lump on the back of her head from the fall. Denies LOC or blood thinners. 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

## 2023-05-06 NOTE — ED Notes (Signed)
See triage note. Mechanical fall last night, bump to posterior head. 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 Alert and oriented.
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
## 2023-05-10 ENCOUNTER — Other Ambulatory Visit: Payer: Self-pay | Admitting: Psychiatry

## 2023-05-10 DIAGNOSIS — F3176 Bipolar disorder, in full remission, most recent episode depressed: Secondary | ICD-10-CM

## 2023-05-15 DIAGNOSIS — K080 Exfoliation of teeth due to systemic causes: Secondary | ICD-10-CM | POA: Diagnosis not present

## 2023-05-17 ENCOUNTER — Telehealth: Payer: Self-pay | Admitting: Family Medicine

## 2023-05-17 ENCOUNTER — Other Ambulatory Visit
Admission: RE | Admit: 2023-05-17 | Discharge: 2023-05-17 | Disposition: A | Discharge status: Home / Self Care | Payer: Medicare Other | Attending: Family Medicine | Admitting: Family Medicine

## 2023-05-17 DIAGNOSIS — E039 Hypothyroidism, unspecified: Secondary | ICD-10-CM | POA: Diagnosis not present

## 2023-05-17 LAB — TSH: TSH: <0.01 u[IU]/mL — ABNORMAL LOW (ref 0.350–4.500)

## 2023-05-17 NOTE — Telephone Encounter (Signed)
Already took care of this message see other note

## 2023-05-17 NOTE — Telephone Encounter (Signed)
Pt called in about her TSH results. Note from provider under results was read to pt. Pt aware and understood. Pt stated she's taking levothyroxine (SYNTHROID) 100 MCG tablet once a day before breakfast. And no changes. Pt stated it was changed from 125 mg to 100 mg.

## 2023-05-22 ENCOUNTER — Telehealth: Payer: Self-pay

## 2023-05-22 ENCOUNTER — Other Ambulatory Visit: Payer: Self-pay

## 2023-05-22 DIAGNOSIS — E039 Hypothyroidism, unspecified: Secondary | ICD-10-CM

## 2023-05-22 MED ORDER — LEVOTHYROXINE SODIUM 75 MCG PO TABS: 75.0000 ug | ORAL_TABLET | Freq: Every day | ORAL | 3 refills | Status: DC

## 2023-05-22 NOTE — Telephone Encounter (Signed)
Patient wanted to know if she should take the Levothyroxine 100 mcg dose until she can get to the Pharmacy on 05/23/23 or 05/24/23 to pick up the 75 mcg dose of Levothyroxine and she was told to continue the 100 mcg dose until she gets the 75 mcg dose to start.

## 2023-05-22 NOTE — Telephone Encounter (Signed)
I don't think so but I'm not an endocrinologist. In general meds like Trileptal and Lithium especially would cause hypothyroidism but her labs indicate the opposite (low TSH). Defer question back to PCP or ask for endocrine consult

## 2023-05-22 NOTE — Telephone Encounter (Signed)
Patient called with concerns about a medication she stated that the last 3 lab test she had done has had an abnormal TSH she would like to know  if this is due to her adding the extra Trileptal in the morning she does have an appointment with her PCP on Friday who had reduced her dosage of the Levothyroxine to 100 mg but she still had an abnormal  TSH with her labs. Please advise.

## 2023-05-22 NOTE — Telephone Encounter (Signed)
Spoke to patient she voiced understanding and stated that she would speak with her PCP on Friday

## 2023-05-22 NOTE — Telephone Encounter (Signed)
Pt called Amy Mcconnell CMA for clarification on Synthroid. Call was transferred.

## 2023-05-24 ENCOUNTER — Other Ambulatory Visit
Admission: RE | Admit: 2023-05-24 | Discharge: 2023-05-24 | Disposition: A | Discharge status: Home / Self Care | Payer: Medicare Other | Source: Home / Self Care | Attending: Family Medicine | Admitting: Family Medicine

## 2023-05-24 ENCOUNTER — Ambulatory Visit (INDEPENDENT_AMBULATORY_CARE_PROVIDER_SITE_OTHER): Payer: Medicare Other | Admitting: Family Medicine

## 2023-05-24 ENCOUNTER — Encounter: Payer: Self-pay | Admitting: Family Medicine

## 2023-05-24 VITALS — BP 128/80 | HR 74 | Temp 98.3°F | Ht 67.0 in | Wt 131.8 lb

## 2023-05-24 DIAGNOSIS — E039 Hypothyroidism, unspecified: Secondary | ICD-10-CM | POA: Insufficient documentation

## 2023-05-24 DIAGNOSIS — R202 Paresthesia of skin: Secondary | ICD-10-CM | POA: Diagnosis not present

## 2023-05-24 LAB — VITAMIN B12: Vitamin B-12: 1754 pg/mL — ABNORMAL HIGH (ref 180–914)

## 2023-05-24 NOTE — Assessment & Plan Note (Addendum)
Chronic issue. Appears to be over treated with synthroid or is having side effects from synthroid. 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 Plan to have her continue synthroid 75 mcg daily and if symptoms do not start to improve over the next couple of weeks on this dose then we may need to stop synthroid for a period of time. Plan to check TPO antibodies. Checking B12 given finger tingling though that could be related to thyroid dysfunction.

## 2023-05-24 NOTE — Progress Notes (Signed)
Marikay Alar, MD Phone: [phone]  Amy Mcconnell is a 72 y.o. female who presents today for f/u.  HYPOTHYROIDISM Disease Monitoring Weight changes: loss  Skin Changes: dry brittle hair  Palpitations: yes Heat/Cold intolerance: heat Also notes feelin ghot, frequent BMs, muscle aches, puffy under eyes, finger tip numbness, being hungry, fatigue, muscle loss, flushing, shakiness, and feet being smaller  Medication Monitoring Compliance:  taking synthroid, took first dose of 75 mcg last night, have been reducing dose as TSH remains mildly low.    Last TSH:   Lab Results  Component Value Date   TSH <0.010 (L) 05/17/2023    Social History   Tobacco Use  Smoking Status Former   Current packs/day: 0.00   Average packs/day: 0.5 packs/day for 13.0 years (6.5 ttl pk-yrs)   Types: Cigarettes   Start date: 01/23/1987   Quit date: 01/23/2000   Years since quitting: 23.3  Smokeless Tobacco Never    Current Outpatient Medications on File Prior to Visit  Medication Sig Dispense Refill   acetaminophen (TYLENOL) 325 MG tablet Take 650 mg by mouth every 6 (six) hours as needed.     Ascorbic Acid (VITAMIN C) 1000 MG tablet Take 1,000 mg by mouth daily.     atorvastatin (LIPITOR) 20 MG tablet Take 1 tablet by mouth once daily 90 tablet 3   B Complex Vitamins (VITAMIN B COMPLEX PO) Take 1 tablet by mouth daily.     Bacillus Coagulans-Inulin (ALIGN PREBIOTIC-PROBIOTIC PO) Take by mouth.     calcium carbonate (OS-CAL) 600 MG TABS tablet Take 600 mg by mouth daily.      Cholecalciferol (VITAMIN D3) 50 MCG (2000 UT) capsule Take 2,000 Units by mouth daily.     diphenhydrAMINE (BENADRYL) 25 mg capsule Take 25 mg by mouth daily as needed for allergies.     docusate sodium (COLACE) 100 MG capsule Take 100 mg by mouth daily.     doxazosin (CARDURA) 2 MG tablet Take 1 tablet (2 mg total) by mouth 2 (two) times daily. May take extra 2 mg daily as needed for BP> 160 180 tablet 2   EPINEPHrine  (EPIPEN 2-PAK) 0.3 mg/0.3 mL IJ SOAJ injection Inject 0.3 mg into the muscle as needed for anaphylaxis. 2 each 0   fexofenadine (ALLEGRA) 180 MG tablet Take 180 mg by mouth daily as needed for allergies.     Flaxseed, Linseed, (FLAXSEED OIL PO) Take 1 capsule by mouth daily.     levothyroxine (SYNTHROID) 75 MCG tablet Take 1 tablet (75 mcg total) by mouth daily. 90 tablet 3   lithium carbonate (LITHOBID) 300 MG ER tablet TAKE 2 TABLETS BY MOUTH AT BEDTIME 60 tablet 2   magnesium oxide (MAG-OX) 400 MG tablet Take 400 mg by mouth daily.     Multiple Vitamins-Minerals (MULTIVITAMIN WITH MINERALS) tablet Take 1 tablet by mouth daily.     Multiple Vitamins-Minerals (VISION FORMULA/LUTEIN) TABS Take 1 tablet by mouth in the morning and at bedtime.     Omega-3 Fatty Acids (FISH OIL) 1000 MG CAPS Take 3,000 mg by mouth daily.     Oxcarbazepine (TRILEPTAL) 300 MG tablet TAKE 1 TABLET BY MOUTH ONCE DAILY IN THE MORNING AND 2 TABS AT BEDTIME 90 tablet 2   vitamin B-12 (CYANOCOBALAMIN) 1000 MCG tablet Take 1,000 mcg by mouth daily.     vitamin E 1000 UNIT capsule Take 1,000 Units by mouth daily.     zinc gluconate 50 MG tablet Take 50 mg by mouth daily.  ziprasidone (GEODON) 20 MG capsule TAKE 1 CAPSULE BY MOUTH ONCE DAILY WITH SUPPER 30 capsule 2   Biotin 5 MG CAPS Take 5 mg by mouth daily. (Patient not taking: Reported on 05/24/2023)     levothyroxine (SYNTHROID) 100 MCG tablet Take 1 tablet (100 mcg total) by mouth daily before breakfast. (Patient not taking: Reported on 05/24/2023) 90 tablet 1   No current facility-administered medications on file prior to visit.     ROS see history of present illness  Objective  Physical Exam Vitals:   05/24/23 0826  BP: 128/80  Pulse: 74  Temp: 98.3 F (36.8 C)  SpO2: 96%    BP Readings from Last 3 Encounters:  05/24/23 128/80  05/06/23 (!) 176/88  03/13/23 122/78   Wt Readings from Last 3 Encounters:  05/24/23 131 lb 12.8 oz (59.8 kg)  03/13/23  133 lb 9.6 oz (60.6 kg)  11/16/22 134 lb (60.8 kg)    Physical Exam Constitutional:      General: She is not in acute distress.    Appearance: She is not diaphoretic.  Cardiovascular:     Rate and Rhythm: Normal rate and regular rhythm.     Heart sounds: Normal heart sounds.  Pulmonary:     Effort: Pulmonary effort is normal.     Breath sounds: Normal breath sounds.  Skin:    General: Skin is warm and dry.  Neurological:     Mental Status: She is alert.     Comments: 5/5 strength in bilateral biceps, triceps, grip, quads, hamstrings, plantar and dorsiflexion, sensation to light touch intact in bilateral UE and LE, normal gait      Assessment/Plan: Please see individual problem list.  Acquired hypothyroidism Assessment & Plan: Chronic issue. Appears to be over treated with synthroid or is having side effects from synthroid. 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 Plan to have her continue synthroid 75 mcg daily and if symptoms do not start to improve over the next couple of weeks on this dose then we may need to stop synthroid for a period of time. Plan to check TPO antibodies. Checking B12 given finger tingling though that could be related to thyroid dysfunction.   Orders: -     Thyroid peroxidase antibody; Future  Hand tingling -     Vitamin B12; Future     Return in about 3 months (around 08/24/2023).   Marikay Alar, MD [facility]

## 2023-05-24 NOTE — Patient Instructions (Signed)
Please let me know if your symptoms are not starting to improve over the next 2 weeks. If they worsen please let us know as well.

## 2023-05-25 LAB — THYROID PEROXIDASE ANTIBODY: Thyroperoxidase Ab SerPl-aCnc: 418 IU/mL — ABNORMAL HIGH (ref 0–34)

## 2023-05-27 ENCOUNTER — Telehealth: Payer: Self-pay | Admitting: Family Medicine

## 2023-05-27 NOTE — Telephone Encounter (Signed)
Patient was calling regarding her lab results but we do have the results yet so the Patient is aware that when they come in we will call her.

## 2023-05-27 NOTE — Telephone Encounter (Signed)
Pt would like to be called regarding lab work

## 2023-05-28 ENCOUNTER — Other Ambulatory Visit: Payer: Self-pay | Admitting: Family Medicine

## 2023-05-28 DIAGNOSIS — E039 Hypothyroidism, unspecified: Secondary | ICD-10-CM

## 2023-06-06 NOTE — Progress Notes (Signed)
Bethanie Dicker, NP-C Phone: [phone]  Amy Mcconnell is a 72 y.o. female who presents today for follow up.   Patient started on decreased dose of Levothyroxine on 05/22/2023, from 100 mcg to 75 mcg. Her TSH was noted to be low on 05/17/2023. She continues to be symptomatic. She is interested in a referral to Endocrinology.   HYPOTHYROIDISM Disease Monitoring Weight changes: No  Skin Changes: No Palpitations: No Heat/Cold intolerance: Yes, hot all of the time. Also notes increased joint pains, increased thirst, constipation and her hair is continuing to fall out.  Medication Monitoring Compliance:  Levothyroxine 75 mcg   Last TSH:   Lab Results  Component Value Date   TSH <0.010 (L) 05/17/2023    Social History   Tobacco Use  Smoking Status Former   Current packs/day: 0.00   Average packs/day: 0.5 packs/day for 13.0 years (6.5 ttl pk-yrs)   Types: Cigarettes   Start date: 01/23/1987   Quit date: 01/23/2000   Years since quitting: 23.3  Smokeless Tobacco Never    Current Outpatient Medications on File Prior to Visit  Medication Sig Dispense Refill   acetaminophen (TYLENOL) 325 MG tablet Take 650 mg by mouth every 6 (six) hours as needed.     Ascorbic Acid (VITAMIN C) 1000 MG tablet Take 1,000 mg by mouth daily.     atorvastatin (LIPITOR) 20 MG tablet Take 1 tablet by mouth once daily 90 tablet 3   B Complex Vitamins (VITAMIN B COMPLEX PO) Take 1 tablet by mouth daily.     Bacillus Coagulans-Inulin (ALIGN PREBIOTIC-PROBIOTIC PO) Take by mouth.     Biotin 5 MG CAPS Take 5 mg by mouth daily. (Patient not taking: Reported on 05/24/2023)     calcium carbonate (OS-CAL) 600 MG TABS tablet Take 600 mg by mouth daily.      Cholecalciferol (VITAMIN D3) 50 MCG (2000 UT) capsule Take 2,000 Units by mouth daily.     diphenhydrAMINE (BENADRYL) 25 mg capsule Take 25 mg by mouth daily as needed for allergies.     docusate sodium (COLACE) 100 MG capsule Take 100 mg by mouth daily.      doxazosin (CARDURA) 2 MG tablet Take 1 tablet (2 mg total) by mouth 2 (two) times daily. May take extra 2 mg daily as needed for BP> 160 180 tablet 2   EPINEPHrine (EPIPEN 2-PAK) 0.3 mg/0.3 mL IJ SOAJ injection Inject 0.3 mg into the muscle as needed for anaphylaxis. 2 each 0   fexofenadine (ALLEGRA) 180 MG tablet Take 180 mg by mouth daily as needed for allergies.     Flaxseed, Linseed, (FLAXSEED OIL PO) Take 1 capsule by mouth daily.     levothyroxine (SYNTHROID) 100 MCG tablet Take 1 tablet (100 mcg total) by mouth daily before breakfast. (Patient not taking: Reported on 05/24/2023) 90 tablet 1   levothyroxine (SYNTHROID) 75 MCG tablet Take 1 tablet (75 mcg total) by mouth daily. 90 tablet 3   lithium carbonate (LITHOBID) 300 MG ER tablet TAKE 2 TABLETS BY MOUTH AT BEDTIME 60 tablet 2   magnesium oxide (MAG-OX) 400 MG tablet Take 400 mg by mouth daily.     Multiple Vitamins-Minerals (MULTIVITAMIN WITH MINERALS) tablet Take 1 tablet by mouth daily.     Multiple Vitamins-Minerals (VISION FORMULA/LUTEIN) TABS Take 1 tablet by mouth in the morning and at bedtime.     Omega-3 Fatty Acids (FISH OIL) 1000 MG CAPS Take 3,000 mg by mouth daily.     Oxcarbazepine (TRILEPTAL) 300 MG  tablet TAKE 1 TABLET BY MOUTH ONCE DAILY IN THE MORNING AND 2 TABS AT BEDTIME 90 tablet 2   vitamin B-12 (CYANOCOBALAMIN) 1000 MCG tablet Take 1,000 mcg by mouth daily.     vitamin E 1000 UNIT capsule Take 1,000 Units by mouth daily.     zinc gluconate 50 MG tablet Take 50 mg by mouth daily.     ziprasidone (GEODON) 20 MG capsule TAKE 1 CAPSULE BY MOUTH ONCE DAILY WITH SUPPER 30 capsule 2   No current facility-administered medications on file prior to visit.    ROS see history of present illness  Objective  Physical Exam Vitals:   06/07/23 0755  BP: 138/80  Pulse: 72  Temp: 98.1 F (36.7 C)  SpO2: 97%    BP Readings from Last 3 Encounters:  06/07/23 138/80  05/24/23 128/80  05/06/23 (!) 176/88   Wt Readings  from Last 3 Encounters:  06/07/23 125 lb 12.8 oz (57.1 kg)  05/24/23 131 lb 12.8 oz (59.8 kg)  03/13/23 133 lb 9.6 oz (60.6 kg)    Physical Exam Constitutional:      General: She is not in acute distress.    Appearance: Normal appearance.  HENT:     Head: Normocephalic.  Neck:     Thyroid: No thyromegaly or thyroid tenderness.  Cardiovascular:     Rate and Rhythm: Normal rate and regular rhythm.     Heart sounds: Normal heart sounds.  Pulmonary:     Effort: Pulmonary effort is normal.     Breath sounds: Normal breath sounds.  Skin:    General: Skin is warm and dry.  Neurological:     General: No focal deficit present.     Mental Status: She is alert.  Psychiatric:        Mood and Affect: Mood normal.        Behavior: Behavior normal.    Assessment/Plan: Please see individual problem list.  Acquired hypothyroidism Assessment & Plan: Chronic. Recently decreased Levothyroxine to 75 mcg daily. She continues to be symptomatic with varying symptoms. TPO antibodies elevated. She is scheduled to have her TSH rechecked on 07/03/2023. Will get thyroid US and refer to Endocrinology for further evaluation. She will continue her Levothyroxine 75 mcg daily, pending lab results anticipate decreasing dose.   Orders: -     Ambulatory referral to Endocrinology -     US THYROID; Future    Return for follow up pending lab results on 07/03/2023.   Bethanie Dicker, NP-C [facility] [employer] [facility]

## 2023-06-07 ENCOUNTER — Ambulatory Visit: Payer: Medicare Other | Admitting: Nurse Practitioner

## 2023-06-07 ENCOUNTER — Encounter: Payer: Self-pay | Admitting: Nurse Practitioner

## 2023-06-07 ENCOUNTER — Other Ambulatory Visit: Payer: Self-pay | Admitting: Cardiovascular Disease

## 2023-06-07 ENCOUNTER — Other Ambulatory Visit: Payer: Self-pay | Admitting: Family Medicine

## 2023-06-07 VITALS — BP 138/80 | HR 72 | Temp 98.1°F | Ht 67.0 in | Wt 125.8 lb

## 2023-06-07 DIAGNOSIS — E039 Hypothyroidism, unspecified: Secondary | ICD-10-CM | POA: Diagnosis not present

## 2023-06-07 DIAGNOSIS — Z1231 Encounter for screening mammogram for malignant neoplasm of breast: Secondary | ICD-10-CM

## 2023-06-07 NOTE — Assessment & Plan Note (Addendum)
Chronic. Recently decreased Levothyroxine to 75 mcg daily. She continues to be symptomatic with varying symptoms. TPO antibodies elevated. She is scheduled to have her TSH rechecked on 07/03/2023. Will get thyroid US and refer to Endocrinology for further evaluation. She will continue her Levothyroxine 75 mcg daily, pending lab results anticipate decreasing dose.

## 2023-06-10 ENCOUNTER — Telehealth: Payer: Self-pay | Admitting: Family Medicine

## 2023-06-10 DIAGNOSIS — E079 Disorder of thyroid, unspecified: Secondary | ICD-10-CM

## 2023-06-10 NOTE — Telephone Encounter (Addendum)
pt called stating [facility] is not taking on patients so she would to be referred to Meredith Mody at [facility]. The pt also wanted a referral to go to [facility]

## 2023-06-10 NOTE — Telephone Encounter (Signed)
Please confirm who the patient wants the referral to go to.  We can only put 1 referral in at a time to a specialist.

## 2023-06-10 NOTE — Telephone Encounter (Signed)
Patient will call the office tomorrow with which specialist she would like.

## 2023-06-10 NOTE — Telephone Encounter (Signed)
Patient states she would like to be referred to Dr. Silvestre Mesi C. Gherghe at [facility].

## 2023-06-10 NOTE — Telephone Encounter (Signed)
noted 

## 2023-06-11 NOTE — Telephone Encounter (Signed)
Referral placed.

## 2023-06-11 NOTE — Addendum Note (Signed)
Addended by: Glori Luis on: 06/11/2023 12:23 PM   Modules accepted: Orders

## 2023-06-13 ENCOUNTER — Ambulatory Visit: Admission: RE | Admit: 2023-06-13 | Payer: Medicare Other | Source: Ambulatory Visit

## 2023-06-14 ENCOUNTER — Telehealth: Payer: Self-pay | Admitting: Family Medicine

## 2023-06-14 NOTE — Telephone Encounter (Signed)
Pt called stating her ultrasound was canceled due to weather but it has been rescheduled for 8/13

## 2023-06-14 NOTE — Telephone Encounter (Signed)
Please let the patient know that Dr Elvera Lennox is not taking new patients. We can have her see someone else in that same practice if she would like. Please see what she would like to do. Thanks.

## 2023-06-14 NOTE — Telephone Encounter (Signed)
Patient would like to be referred to Amy Mcconnell at [facility] and she states that they told her this provider is accepting new patients.

## 2023-06-17 NOTE — Telephone Encounter (Signed)
Referral placed.

## 2023-06-17 NOTE — Telephone Encounter (Signed)
Lvm for pt to give office a call back

## 2023-06-17 NOTE — Addendum Note (Signed)
Addended by: Birdie Sons, Ledon Weihe G on: 06/17/2023 12:15 PM   Modules accepted: Orders

## 2023-06-17 NOTE — Telephone Encounter (Signed)
Spoke to pt, pt was notified of referral being placed

## 2023-06-18 ENCOUNTER — Ambulatory Visit
Admission: RE | Admit: 2023-06-18 | Discharge: 2023-06-18 | Disposition: A | Discharge status: Home / Self Care | Payer: Medicare Other | Source: Ambulatory Visit | Attending: Nurse Practitioner | Admitting: Nurse Practitioner

## 2023-06-18 DIAGNOSIS — E039 Hypothyroidism, unspecified: Secondary | ICD-10-CM | POA: Diagnosis not present

## 2023-06-18 DIAGNOSIS — E042 Nontoxic multinodular goiter: Secondary | ICD-10-CM | POA: Diagnosis not present

## 2023-06-25 ENCOUNTER — Telehealth: Payer: Self-pay

## 2023-06-25 NOTE — Telephone Encounter (Signed)
Patient just called back. She said they got the referral, but she doesn't know yet if they are going to accept her or not. She said she will call back.

## 2023-06-25 NOTE — Telephone Encounter (Signed)
Called pt in regards to her [place] results and to see if she has heard from Endocrinology, pt stated that every time she calls they say they do not have the referral but , on 06-17-23 Dr. Birdie Sons sent in the referral.    Pt stated that's he will call over there to see if they have the referral now and she will give me a call back once she knows something.

## 2023-06-28 ENCOUNTER — Telehealth: Payer: Self-pay

## 2023-06-28 NOTE — Telephone Encounter (Signed)
Noted  

## 2023-06-28 NOTE — Telephone Encounter (Signed)
Patient states one of the four nodules on her thyroid needs to be biopsied.  Patient states she has an appointment with Dr. Standley Brooking on 07/24/2023 to have the biopsy.  Patient states she would like for Bethanie Dicker, NP, to know.

## 2023-06-30 ENCOUNTER — Other Ambulatory Visit: Payer: Self-pay | Admitting: Cardiovascular Disease

## 2023-07-01 ENCOUNTER — Telehealth: Payer: Self-pay | Admitting: Cardiovascular Disease

## 2023-07-01 NOTE — Telephone Encounter (Signed)
Left voice mail to schedule appt

## 2023-07-01 NOTE — Telephone Encounter (Signed)
Pt scheduled on 10/21.

## 2023-07-01 NOTE — Telephone Encounter (Signed)
Left voice mail, pt needs to schedule appt

## 2023-07-01 NOTE — Telephone Encounter (Signed)
*  STAT* If patient is at the pharmacy, call can be transferred to refill team.   1. Which medications need to be refilled? (please list name of each medication and dose if known) doxazosin (CARDURA) 2 MG tablet     4. Which pharmacy/location (including street and city if local pharmacy) is medication to be sent to? [id number] - [place], [place] - [address]     5. Do they need a 30 day or 90 day supply? 90

## 2023-07-01 NOTE — Telephone Encounter (Signed)
 Pt scheduled 08/26/23

## 2023-07-01 NOTE — Telephone Encounter (Signed)
Hi,  Could you please schedule this patient a 12 month follow up appointment? The patient was last seen by Dr. Mariah Milling on 05-23-2022. Thank you so much.

## 2023-07-05 ENCOUNTER — Ambulatory Visit: Payer: Medicare Other | Admitting: Family Medicine

## 2023-07-05 ENCOUNTER — Encounter: Payer: Self-pay | Admitting: Family Medicine

## 2023-07-05 ENCOUNTER — Ambulatory Visit
Admission: RE | Admit: 2023-07-05 | Discharge: 2023-07-05 | Disposition: A | Discharge status: Home / Self Care | Payer: Medicare Other | Source: Ambulatory Visit | Attending: Family Medicine | Admitting: Family Medicine

## 2023-07-05 ENCOUNTER — Ambulatory Visit: Payer: Medicare Other | Admitting: Nurse Practitioner

## 2023-07-05 VITALS — BP 136/82 | HR 83 | Temp 97.5°F | Ht 67.0 in | Wt 122.0 lb

## 2023-07-05 DIAGNOSIS — T17908A Unspecified foreign body in respiratory tract, part unspecified causing other injury, initial encounter: Secondary | ICD-10-CM

## 2023-07-05 DIAGNOSIS — R634 Abnormal weight loss: Secondary | ICD-10-CM

## 2023-07-05 DIAGNOSIS — R631 Polydipsia: Secondary | ICD-10-CM | POA: Insufficient documentation

## 2023-07-05 DIAGNOSIS — R1310 Dysphagia, unspecified: Secondary | ICD-10-CM | POA: Insufficient documentation

## 2023-07-05 DIAGNOSIS — R059 Cough, unspecified: Secondary | ICD-10-CM | POA: Diagnosis not present

## 2023-07-05 LAB — CBC WITH DIFFERENTIAL/PLATELET
Basophils Absolute: 0 10*3/uL (ref 0.0–0.1)
Basophils Relative: 0.2 % (ref 0.0–3.0)
Eosinophils Absolute: 0 10*3/uL (ref 0.0–0.7)
Eosinophils Relative: 0.3 % (ref 0.0–5.0)
HCT: 42 % (ref 36.0–46.0)
Hemoglobin: 13.1 g/dL (ref 12.0–15.0)
Lymphocytes Relative: 47.7 % — ABNORMAL HIGH (ref 12.0–46.0)
Lymphs Abs: 1.5 10*3/uL (ref 0.7–4.0)
MCHC: 31.2 g/dL (ref 30.0–36.0)
MCV: 82.9 fl (ref 78.0–100.0)
Monocytes Absolute: 0.8 10*3/uL (ref 0.1–1.0)
Monocytes Relative: 24.6 % — ABNORMAL HIGH (ref 3.0–12.0)
Neutro Abs: 0.8 10*3/uL — ABNORMAL LOW (ref 1.4–7.7)
Neutrophils Relative %: 27.2 % — ABNORMAL LOW (ref 43.0–77.0)
Platelets: 141 10*3/uL — ABNORMAL LOW (ref 150.0–400.0)
RBC: 5.07 Mil/uL (ref 3.87–5.11)
RDW: 15.3 % (ref 11.5–15.5)
WBC: 3 10*3/uL — ABNORMAL LOW (ref 4.0–10.5)

## 2023-07-05 LAB — HEMOGLOBIN A1C: Hgb A1c MFr Bld: 5.5 % (ref 4.6–6.5)

## 2023-07-05 LAB — COMPREHENSIVE METABOLIC PANEL
ALT: 68 U/L — ABNORMAL HIGH (ref 0–35)
AST: 56 U/L — ABNORMAL HIGH (ref 0–37)
Albumin: 4 g/dL (ref 3.5–5.2)
Alkaline Phosphatase: 139 U/L — ABNORMAL HIGH (ref 39–117)
BUN: 35 mg/dL — ABNORMAL HIGH (ref 6–23)
CO2: 22 mEq/L (ref 19–32)
Calcium: 9.9 mg/dL (ref 8.4–10.5)
Chloride: 105 mEq/L (ref 96–112)
Creatinine, Ser: 0.63 mg/dL (ref 0.40–1.20)
GFR: 88.46 mL/min (ref >60.00)
Glucose, Bld: 86 mg/dL (ref 70–99)
Potassium: 4.5 mEq/L (ref 3.5–5.1)
Sodium: 136 mEq/L (ref 135–145)
Total Bilirubin: 0.3 mg/dL (ref 0.2–1.2)
Total Protein: 6.9 g/dL (ref 6.0–8.3)

## 2023-07-05 LAB — TSH: TSH: <0.01 u[IU]/mL — ABNORMAL LOW (ref 0.35–5.50)

## 2023-07-05 NOTE — Assessment & Plan Note (Signed)
Check A1c. 

## 2023-07-05 NOTE — Assessment & Plan Note (Signed)
Patient reports aspiration event.  Has continued to cough since this occurred.  She has benign lung exam.  Chest x-ray to be completed today.  I will base potential treatment for aspiration pneumonia based on chest x-ray and lab results.

## 2023-07-05 NOTE — Progress Notes (Signed)
Marikay Alar, MD Phone: [phone]  Amy Mcconnell [place] is a 72 y.o. female who presents today for same-day visit.  Dysphagia/aspiration: Patient notes over the last month or so she has had trouble swallowing.  She feels as though stuff gets stuck at the top of her sternum.  And she will cough a lot and feels as though she has aspirated on numerous occasions.  She has had difficulty swallowing peanut butter sandwiches as well as chicken salad.  She notes no reflux symptoms.  No blood in her stool.  No hemoptysis.  No fevers.  She has had increased thirst for a couple of weeks.  She also reports low energy and poor appetite.  Reports chronic constipation as well.  Patient notes her job was concerned that she may have aspiration pneumonia.  Social History   Tobacco Use  Smoking Status Former   Current packs/day: 0.00   Average packs/day: 0.5 packs/day for 13.0 years (6.5 ttl pk-yrs)   Types: Cigarettes   Start date: 01/23/1987   Quit date: 01/23/2000   Years since quitting: 23.4  Smokeless Tobacco Never    Current Outpatient Medications on File Prior to Visit  Medication Sig Dispense Refill   acetaminophen (TYLENOL) 325 MG tablet Take 650 mg by mouth every 6 (six) hours as needed.     Ascorbic Acid (VITAMIN C) 1000 MG tablet Take 1,000 mg by mouth daily.     atorvastatin (LIPITOR) 20 MG tablet Take 1 tablet by mouth once daily 90 tablet 3   B Complex Vitamins (VITAMIN B COMPLEX PO) Take 1 tablet by mouth daily.     Bacillus Coagulans-Inulin (ALIGN PREBIOTIC-PROBIOTIC PO) Take by mouth.     calcium carbonate (OS-CAL) 600 MG TABS tablet Take 600 mg by mouth daily.      Cholecalciferol (VITAMIN D3) 50 MCG (2000 UT) capsule Take 2,000 Units by mouth daily.     diphenhydrAMINE (BENADRYL) 25 mg capsule Take 25 mg by mouth daily as needed for allergies.     docusate sodium (COLACE) 100 MG capsule Take 100 mg by mouth 2 (two) times daily.     doxazosin (CARDURA) 2 MG tablet TAKE 1 TABLET BY  MOUTH TWICE DAILY MAY  TAKE  EXTRA  TABLET  AS  NEEDED  FOR  BP  GREATER  THAN  160 180 tablet 0   EPINEPHrine (EPIPEN 2-PAK) 0.3 mg/0.3 mL IJ SOAJ injection Inject 0.3 mg into the muscle as needed for anaphylaxis. 2 each 0   fexofenadine (ALLEGRA) 180 MG tablet Take 180 mg by mouth daily as needed for allergies.     Flaxseed, Linseed, (FLAXSEED OIL PO) Take 1 capsule by mouth daily.     levothyroxine (SYNTHROID) 75 MCG tablet Take 1 tablet (75 mcg total) by mouth daily. 90 tablet 3   lithium carbonate (LITHOBID) 300 MG ER tablet TAKE 2 TABLETS BY MOUTH AT BEDTIME 60 tablet 2   magnesium oxide (MAG-OX) 400 MG tablet Take 400 mg by mouth daily.     Multiple Vitamins-Minerals (MULTIVITAMIN WITH MINERALS) tablet Take 1 tablet by mouth daily.     Multiple Vitamins-Minerals (VISION FORMULA/LUTEIN) TABS Take 1 tablet by mouth in the morning and at bedtime.     Omega-3 Fatty Acids (FISH OIL) 1000 MG CAPS Take 3,000 mg by mouth daily.     Oxcarbazepine (TRILEPTAL) 300 MG tablet TAKE 1 TABLET BY MOUTH ONCE DAILY IN THE MORNING AND 2 TABS AT BEDTIME 90 tablet 2   vitamin B-12 (CYANOCOBALAMIN) 1000 MCG tablet  Take 1,000 mcg by mouth daily.     vitamin E 1000 UNIT capsule Take 1,000 Units by mouth daily.     zinc gluconate 50 MG tablet Take 50 mg by mouth daily.     ziprasidone (GEODON) 20 MG capsule TAKE 1 CAPSULE BY MOUTH ONCE DAILY WITH SUPPER 30 capsule 2   No current facility-administered medications on file prior to visit.     ROS see history of present illness  Objective  Physical Exam Vitals:   07/05/23 0746  BP: 136/82  Pulse: 83  Temp: (!) 97.5 F (36.4 C)  SpO2: 97%    BP Readings from Last 3 Encounters:  07/05/23 136/82  06/07/23 138/80  05/24/23 128/80   Wt Readings from Last 3 Encounters:  07/05/23 122 lb (55.3 kg)  06/07/23 125 lb 12.8 oz (57.1 kg)  05/24/23 131 lb 12.8 oz (59.8 kg)    Physical Exam Constitutional:      General: She is not in acute distress.     Appearance: She is not diaphoretic.  Cardiovascular:     Rate and Rhythm: Normal rate and regular rhythm.     Heart sounds: Normal heart sounds.  Pulmonary:     Effort: Pulmonary effort is normal.     Breath sounds: Normal breath sounds.  Skin:    General: Skin is warm and dry.  Neurological:     Mental Status: She is alert.      Assessment/Plan: Please see individual problem list.  Dysphagia, unspecified type Assessment & Plan: Ongoing issue over the last month.  Will refer to ENT and GI given the location of her reported sensation of food sticking.  Advised to seek medical attention if she has something get stuck that cannot come up or go down.  Additionally checking lab work given reported weight loss.  Orders: -     Ambulatory referral to ENT -     Ambulatory referral to Gastroenterology  Polydipsia Assessment & Plan: Check A1c.  Orders: -     Hemoglobin A1c  Aspiration into airway, initial encounter Assessment & Plan: Patient reports aspiration event.  Has continued to cough since this occurred.  She has benign lung exam.  Chest x-ray to be completed today.  I will base potential treatment for aspiration pneumonia based on chest x-ray and lab results.  Orders: -     CBC with Differential/Platelet -     DG Chest 2 View; Future  Weight loss -     Comprehensive metabolic panel -     TSH    Return in about 2 weeks (around 07/19/2023).   Marikay Alar, MD [facility]

## 2023-07-05 NOTE — Assessment & Plan Note (Addendum)
Ongoing issue over the last month.  Will refer to ENT and GI given the location of her reported sensation of food sticking.  Advised to seek medical attention if she has something get stuck that cannot come up or go down.  Additionally checking lab work given reported weight loss.

## 2023-07-05 NOTE — Patient Instructions (Signed)
Nice to see you. Please seek medical attention if you start to feel worse.  If you have any fevers, shortness of breath, cough productive of blood, or any food sticking that will go down or, please seek medical attention immediately.

## 2023-07-09 ENCOUNTER — Telehealth: Payer: Self-pay

## 2023-07-09 ENCOUNTER — Other Ambulatory Visit: Payer: Self-pay | Admitting: Family Medicine

## 2023-07-09 DIAGNOSIS — R7989 Other specified abnormal findings of blood chemistry: Secondary | ICD-10-CM

## 2023-07-09 DIAGNOSIS — D72819 Decreased white blood cell count, unspecified: Secondary | ICD-10-CM

## 2023-07-09 NOTE — Telephone Encounter (Signed)
Patient states she is returning a call from Prince Solian, CMA.  Patient states Dr. Marikay Alar suggested she have an ultrasound.  Patient states she believes the Extra Strength Tylenol she has been taking has elevated her liver enzymes.  Patient states she would like for Dr. Birdie Sons to recommend something she can take in place of the Extra Strength Tylenol.  Patient states she does not want to take anything addictive.  Patient states her preferred pharmacy is [facility] on [address]

## 2023-07-10 NOTE — Telephone Encounter (Signed)
Patient states she has been without Tylenol Extra Strength for 2 days and she is learning to live and deal with the Pain. Patient states she will get some Ibuprofen or Aleve just in case and she will take it with food if she needs it.

## 2023-07-10 NOTE — Telephone Encounter (Signed)
She could take over-the-counter ibuprofen or Aleve on an as-needed basis though she would need to take these with food and take them sparingly.

## 2023-07-11 ENCOUNTER — Other Ambulatory Visit
Admission: RE | Admit: 2023-07-11 | Discharge: 2023-07-11 | Disposition: A | Discharge status: Home / Self Care | Payer: Medicare Other | Attending: Family Medicine | Admitting: Family Medicine

## 2023-07-11 DIAGNOSIS — R7989 Other specified abnormal findings of blood chemistry: Secondary | ICD-10-CM | POA: Insufficient documentation

## 2023-07-11 DIAGNOSIS — D72819 Decreased white blood cell count, unspecified: Secondary | ICD-10-CM | POA: Diagnosis not present

## 2023-07-11 LAB — CBC WITH DIFFERENTIAL/PLATELET
Abs Immature Granulocytes: 0.01 10*3/uL (ref 0.00–0.07)
Basophils Absolute: 0 10*3/uL (ref 0.0–0.1)
Basophils Relative: 0 %
Eosinophils Absolute: 0.1 10*3/uL (ref 0.0–0.5)
Eosinophils Relative: 1 %
HCT: 37.4 % (ref 36.0–46.0)
Hemoglobin: 12.1 g/dL (ref 12.0–15.0)
Immature Granulocytes: 0 %
Lymphocytes Relative: 33 %
Lymphs Abs: 1.5 10*3/uL (ref 0.7–4.0)
MCH: 26.3 pg (ref 26.0–34.0)
MCHC: 32.4 g/dL (ref 30.0–36.0)
MCV: 81.3 fL (ref 80.0–100.0)
Monocytes Absolute: 0.9 10*3/uL (ref 0.1–1.0)
Monocytes Relative: 19 %
Neutro Abs: 2.1 10*3/uL (ref 1.7–7.7)
Neutrophils Relative %: 47 %
Platelets: 163 10*3/uL (ref 150–400)
RBC: 4.6 MIL/uL (ref 3.87–5.11)
RDW: 14 % (ref 11.5–15.5)
WBC: 4.6 10*3/uL (ref 4.0–10.5)
nRBC: 0 % (ref 0.0–0.2)

## 2023-07-11 LAB — HEPATIC FUNCTION PANEL
ALT: 66 U/L — ABNORMAL HIGH (ref 0–44)
AST: 48 U/L — ABNORMAL HIGH (ref 15–41)
Albumin: 3.8 g/dL (ref 3.5–5.0)
Alkaline Phosphatase: 126 U/L (ref 38–126)
Bilirubin, Direct: <0.1 mg/dL (ref 0.0–0.2)
Total Bilirubin: 0.5 mg/dL (ref 0.3–1.2)
Total Protein: 7.1 g/dL (ref 6.5–8.1)

## 2023-07-14 ENCOUNTER — Other Ambulatory Visit: Payer: Self-pay | Admitting: Psychiatry

## 2023-07-14 DIAGNOSIS — F3176 Bipolar disorder, in full remission, most recent episode depressed: Secondary | ICD-10-CM

## 2023-07-15 ENCOUNTER — Telehealth: Payer: Self-pay

## 2023-07-15 DIAGNOSIS — E039 Hypothyroidism, unspecified: Secondary | ICD-10-CM

## 2023-07-15 DIAGNOSIS — R7989 Other specified abnormal findings of blood chemistry: Secondary | ICD-10-CM

## 2023-07-15 NOTE — Telephone Encounter (Signed)
Patient called and note from Dr Birdie Sons was read. Patient request that lab orders to check her  liver enzymes be at [facility] lab.

## 2023-07-15 NOTE — Telephone Encounter (Signed)
Left message to call the office back regarding Dr. Sonnenberg's message below. 

## 2023-07-15 NOTE — Group Note (Deleted)

## 2023-07-15 NOTE — Telephone Encounter (Signed)
Ordered to be done at the lab at the hospital.

## 2023-07-15 NOTE — Telephone Encounter (Signed)
-----   Message from Marikay Alar sent at 07/15/2023  1:59 PM EDT ----- Noted.  I would suggest rechecking her liver enzymes in 4 weeks.  We can recheck her TSH at that time as well.  If she develops any abdominal pain she needs to let us know.  She needs to see endocrinology as planned for her thyroid issues.

## 2023-07-17 ENCOUNTER — Telehealth: Payer: Self-pay | Admitting: Family Medicine

## 2023-07-17 ENCOUNTER — Other Ambulatory Visit: Payer: Self-pay

## 2023-07-17 ENCOUNTER — Ambulatory Visit: Payer: Medicare Other | Admitting: Physician Assistant

## 2023-07-17 MED ORDER — COMIRNATY 30 MCG/0.3ML IM SUSY
0.3000 mL | PREFILLED_SYRINGE | Freq: Once | INTRAMUSCULAR | 0 refills | Status: AC
  Filled 2023-07-17: qty 0.3, 1d supply, fill #0

## 2023-07-17 NOTE — Telephone Encounter (Signed)
Noted. I can not see the endocrinology records in the chart and will await her bringing in the records.

## 2023-07-17 NOTE — Telephone Encounter (Signed)
Patient just walked in and said the referral is put in. The doctor needs to see any information dealing with her thyroids. She said she will take the papers with her on Monday when she comes to her appointment.

## 2023-07-18 ENCOUNTER — Telehealth: Payer: Self-pay | Admitting: Family Medicine

## 2023-07-18 NOTE — Telephone Encounter (Signed)
Patient would like to know if she could get a copy of her ultra sound results so she could give to her doctor the specialist she is going to see on Wednesday. Can you call her. Her number is [phone].

## 2023-07-19 ENCOUNTER — Telehealth: Payer: Self-pay

## 2023-07-19 NOTE — Telephone Encounter (Signed)
Noted  

## 2023-07-19 NOTE — Telephone Encounter (Signed)
Called Patient to let her know I printed her results and put them up front in the Patient pick up folder.

## 2023-07-19 NOTE — Telephone Encounter (Signed)
Patient states she would like for Prince Solian, CMA, to know that she did come by to pick up her ultrasound report today and she has taken it to the doctor it needs to go to.  Patient states she would like to thank Lanora Manis for her help.

## 2023-07-22 ENCOUNTER — Ambulatory Visit (INDEPENDENT_AMBULATORY_CARE_PROVIDER_SITE_OTHER): Payer: Medicare Other | Admitting: Family Medicine

## 2023-07-22 ENCOUNTER — Encounter: Payer: Self-pay | Admitting: Family Medicine

## 2023-07-22 VITALS — BP 134/74 | HR 85 | Temp 98.3°F | Ht 67.0 in | Wt 128.0 lb

## 2023-07-22 DIAGNOSIS — R1310 Dysphagia, unspecified: Secondary | ICD-10-CM | POA: Diagnosis not present

## 2023-07-22 DIAGNOSIS — E039 Hypothyroidism, unspecified: Secondary | ICD-10-CM | POA: Diagnosis not present

## 2023-07-22 DIAGNOSIS — T17908D Unspecified foreign body in respiratory tract, part unspecified causing other injury, subsequent encounter: Secondary | ICD-10-CM

## 2023-07-22 NOTE — Progress Notes (Signed)
Marikay Alar, MD Phone: [phone]  Amy Mcconnell is a 72 y.o. female who presents today for follow-up.  Weight loss/dysphagia: Patient notes she feels quite a bit better off of Synthroid.  She sees endocrinology later this week to evaluate this further.  She has improved appetite.  She held off on seeing ENT until she completes evaluation with endocrinology just in case that was causing her dysphagia.  She also had to reschedule her GI visit.  Overall her cough is better as well.  Social History   Tobacco Use  Smoking Status Former   Current packs/day: 0.00   Average packs/day: 0.5 packs/day for 13.0 years (6.5 ttl pk-yrs)   Types: Cigarettes   Start date: 01/23/1987   Quit date: 01/23/2000   Years since quitting: 23.5  Smokeless Tobacco Never    Current Outpatient Medications on File Prior to Visit  Medication Sig Dispense Refill   acetaminophen (TYLENOL) 325 MG tablet Take 650 mg by mouth every 6 (six) hours as needed.     Ascorbic Acid (VITAMIN C) 1000 MG tablet Take 1,000 mg by mouth daily.     atorvastatin (LIPITOR) 20 MG tablet Take 1 tablet by mouth once daily 90 tablet 3   B Complex Vitamins (VITAMIN B COMPLEX PO) Take 1 tablet by mouth daily.     Bacillus Coagulans-Inulin (ALIGN PREBIOTIC-PROBIOTIC PO) Take by mouth.     calcium carbonate (OS-CAL) 600 MG TABS tablet Take 600 mg by mouth daily.      Cholecalciferol (VITAMIN D3) 50 MCG (2000 UT) capsule Take 2,000 Units by mouth daily.     diphenhydrAMINE (BENADRYL) 25 mg capsule Take 25 mg by mouth daily as needed for allergies.     docusate sodium (COLACE) 100 MG capsule Take 100 mg by mouth 2 (two) times daily.     doxazosin (CARDURA) 2 MG tablet TAKE 1 TABLET BY MOUTH TWICE DAILY MAY  TAKE  EXTRA  TABLET  AS  NEEDED  FOR  BP  GREATER  THAN  160 180 tablet 0   EPINEPHrine (EPIPEN 2-PAK) 0.3 mg/0.3 mL IJ SOAJ injection Inject 0.3 mg into the muscle as needed for anaphylaxis. 2 each 0   fexofenadine (ALLEGRA) 180 MG  tablet Take 180 mg by mouth daily as needed for allergies.     Flaxseed, Linseed, (FLAXSEED OIL PO) Take 1 capsule by mouth daily.     lithium carbonate (LITHOBID) 300 MG ER tablet TAKE 2 TABLETS BY MOUTH AT BEDTIME 60 tablet 2   magnesium oxide (MAG-OX) 400 MG tablet Take 400 mg by mouth daily.     Multiple Vitamins-Minerals (MULTIVITAMIN WITH MINERALS) tablet Take 1 tablet by mouth daily.     Multiple Vitamins-Minerals (VISION FORMULA/LUTEIN) TABS Take 1 tablet by mouth in the morning and at bedtime.     Omega-3 Fatty Acids (FISH OIL) 1000 MG CAPS Take 3,000 mg by mouth daily.     Oxcarbazepine (TRILEPTAL) 300 MG tablet TAKE 1 TABLET BY MOUTH IN THE MORNING AND 2 AT BEDTIME 90 tablet 0   vitamin B-12 (CYANOCOBALAMIN) 1000 MCG tablet Take 1,000 mcg by mouth daily.     vitamin E 1000 UNIT capsule Take 1,000 Units by mouth daily.     zinc gluconate 50 MG tablet Take 50 mg by mouth daily.     ziprasidone (GEODON) 20 MG capsule TAKE 1 CAPSULE BY MOUTH ONCE DAILY WITH SUPPER 30 capsule 2   No current facility-administered medications on file prior to visit.  ROS see history of present illness  Objective  Physical Exam Vitals:   07/22/23 1425  BP: 134/74  Pulse: 85  Temp: 98.3 F (36.8 C)  SpO2: 98%    BP Readings from Last 3 Encounters:  07/22/23 134/74  07/05/23 136/82  06/07/23 138/80   Wt Readings from Last 3 Encounters:  07/22/23 128 lb (58.1 kg)  07/05/23 122 lb (55.3 kg)  06/07/23 125 lb 12.8 oz (57.1 kg)    Physical Exam Constitutional:      General: She is not in acute distress.    Appearance: She is not diaphoretic.  Cardiovascular:     Rate and Rhythm: Normal rate and regular rhythm.     Heart sounds: Normal heart sounds.  Pulmonary:     Effort: Pulmonary effort is normal.     Breath sounds: Normal breath sounds.  Skin:    General: Skin is warm and dry.  Neurological:     Mental Status: She is alert.      Assessment/Plan: Please see individual  problem list.  Dysphagia, unspecified type Assessment & Plan: Patient will see GI as planned.  I encouraged her to contact ENT particularly if endocrinology does not feel like the thyroid issue is contributing to this.   Acquired hypothyroidism Assessment & Plan: Chronic issue.  She is off of levothyroxine and is starting to feel better.  Suspect weight loss is related to overtreatment with her levothyroxine.  She will remain off of this and see endocrinology later this week.   Aspiration into airway, subsequent encounter Assessment & Plan: Cough has been improving.  No obvious signs of aspiration pneumonia on prior workup.     Return for as scheduled.   Marikay Alar, MD [facility]

## 2023-07-22 NOTE — Assessment & Plan Note (Addendum)
Chronic issue.  She is off of levothyroxine and is starting to feel better.  Suspect weight loss is related to overtreatment with her levothyroxine.  She will remain off of this and see endocrinology later this week.

## 2023-07-22 NOTE — Assessment & Plan Note (Signed)
Cough has been improving.  No obvious signs of aspiration pneumonia on prior workup.

## 2023-07-22 NOTE — Assessment & Plan Note (Signed)
Patient will see GI as planned.  I encouraged her to contact ENT particularly if endocrinology does not feel like the thyroid issue is contributing to this.

## 2023-07-24 DIAGNOSIS — E042 Nontoxic multinodular goiter: Secondary | ICD-10-CM | POA: Diagnosis not present

## 2023-07-24 DIAGNOSIS — E0590 Thyrotoxicosis, unspecified without thyrotoxic crisis or storm: Secondary | ICD-10-CM | POA: Diagnosis not present

## 2023-07-24 DIAGNOSIS — Z23 Encounter for immunization: Secondary | ICD-10-CM | POA: Diagnosis not present

## 2023-07-29 ENCOUNTER — Telehealth: Payer: Self-pay | Admitting: Family Medicine

## 2023-07-29 ENCOUNTER — Telehealth: Payer: Self-pay

## 2023-07-29 NOTE — Telephone Encounter (Signed)
pt left a message that she was taking a leave of absence from the hospital starting tomorrow.

## 2023-07-29 NOTE — Telephone Encounter (Signed)
left message asking patient to call office back to set up an appt since she was last seen on 5-10 and to also get more information

## 2023-07-29 NOTE — Telephone Encounter (Signed)
Patient called and wanted Dr Birdie Sons to know that she saw Dr Octavio Manns today and she prescribed <etjo,azp;e 10mg  for suppress thyroid hormones.

## 2023-07-30 ENCOUNTER — Ambulatory Visit (INDEPENDENT_AMBULATORY_CARE_PROVIDER_SITE_OTHER): Payer: Medicare Other | Admitting: Psychiatry

## 2023-07-30 ENCOUNTER — Other Ambulatory Visit: Payer: Self-pay

## 2023-07-30 ENCOUNTER — Encounter: Payer: Self-pay | Admitting: Psychiatry

## 2023-07-30 VITALS — BP 160/85 | HR 101 | Temp 97.4°F | Ht 67.0 in | Wt 123.6 lb

## 2023-07-30 DIAGNOSIS — Z79899 Other long term (current) drug therapy: Secondary | ICD-10-CM | POA: Diagnosis not present

## 2023-07-30 DIAGNOSIS — F3176 Bipolar disorder, in full remission, most recent episode depressed: Secondary | ICD-10-CM

## 2023-07-30 DIAGNOSIS — F5101 Primary insomnia: Secondary | ICD-10-CM | POA: Diagnosis not present

## 2023-07-30 DIAGNOSIS — T50905A Adverse effect of unspecified drugs, medicaments and biological substances, initial encounter: Secondary | ICD-10-CM

## 2023-07-30 MED ORDER — LITHIUM CARBONATE ER 300 MG PO TBCR
600.0000 mg | EXTENDED_RELEASE_TABLET | Freq: Every day | ORAL | 5 refills | Status: AC
Start: 2023-07-30 — End: ?

## 2023-07-30 MED ORDER — ZIPRASIDONE HCL 20 MG PO CAPS
20.0000 mg | ORAL_CAPSULE | Freq: Every day | ORAL | 5 refills | Status: DC
Start: 2023-07-30 — End: 2024-02-02

## 2023-07-30 NOTE — Telephone Encounter (Signed)
Noted  

## 2023-07-30 NOTE — Progress Notes (Unsigned)
[facility] MD OP Progress Note  07/30/2023 3:03 PM Amy Mcconnell  MRN:  [id number]  Chief Complaint:  Chief Complaint  Patient presents with   Follow-up   Anxiety   Depression   Medication Refill   HPI: Amy Mcconnell is a 72 year old Caucasian female who is currently divorced lives in [place], has a history of bipolar disorder, primary insomnia ,essential hypertension, tachycardia, hyperlipoidemia, heart murmur, thyroid abnormalities was evaluated in office today.  Patient today reports she is currently under the care of endocrinologist.  Patient was diagnosed with hypothyroidism, multinodular goiter.  I have reviewed notes per Dr. Jillyn Hidden 07/25/2023.  Patient is currently on methimazole.'  Patient today reports she does continue to have physical symptoms of hyperthyroidism.  She however reports she wants to give the medication more time.  She has upcoming appointment scheduled with endocrinology.  She however reports mood symptoms as overall okay on the current medication.  She does have situational anxiety however managing it okay.  She does have restless sleep on and off.  She however reports that is likely also due to her hyperthyroidism.  She reports some reduced appetite and has been losing weight.  She however is motivated to start eating as much as she can to get the weight back.  Patient denies any suicidality, homicidality or perceptual disturbances.  Patient is currently compliant on medications like Geodon, lithium, Trileptal.  Denies side effects.  Patient agreeable to get labs repeated since she is on medications like lithium which can get toxic in her system.  Patient denies any other concerns today.  Visit Diagnosis:    ICD-10-CM   1. Bipolar disorder, in full remission, most recent episode depressed (HCC)  F31.76 Lithium level    BUN    Creatinine, serum    Sodium    Platelet count    ziprasidone (GEODON) 20 MG capsule    lithium carbonate (LITHOBID) 300 MG ER  tablet    2. Primary insomnia  F51.01 Lithium level    3. High risk medication use  Z79.899 Lithium level    BUN    Creatinine, serum    Sodium    Platelet count      Past Psychiatric History: I have reviewed past psychiatric history from progress note on 01/04/2022.  Past trials of medications-multiple.  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 Alycia Rossetti - every 3 months   Broken foot    left    Contact dermatitis and other eczema due to other specified agent    Contusion of unspecified part of lower limb    Essential hypertension, benign    Heart murmur    Other and unspecified hyperlipidemia    Other specified disorders of thyroid    Reflux esophagitis    Scalp laceration 04/13/2019   Tachycardia    a. isolated episode, seen in ED with negative work up    Past Surgical History:  Procedure Laterality Date   CATARACT EXTRACTION     COLONOSCOPY WITH PROPOFOL N/A 03/27/2022   Procedure: COLONOSCOPY WITH PROPOFOL;  Surgeon: Midge Minium, MD;  Location: [facility] ENDOSCOPY;  Service: Endoscopy;  Laterality: N/A;   hamertoe     HAMMER TOE SURGERY  11/30/2021   right 4th toe 11/10/21 Dr. Lilian Kapur   SALPINGECTOMY Left    STERILIZATION     Her decision  since dz of bipolar    Family Psychiatric History: I have reviewed family psychiatric history from progress note on 01/04/2022.  Family History:  Family History  Problem Relation Age of Onset   Arrhythmia Mother        A-Fib   Heart failure Mother    Hyperlipidemia Mother    Hypertension Mother    Alzheimer's disease Mother    Hypertension Father    Hyperlipidemia Father    Mental retardation Father 50       Suicide   Hypertension Brother    Hyperlipidemia Brother    Hypertension Brother    Hyperlipidemia Brother    Asthma Brother    COPD Brother    Hyperlipidemia Brother    Hypertension  Brother    Alcohol abuse Brother    Depression Brother    Emphysema Brother    Hyperlipidemia Brother    Hypertension Brother    Breast cancer Maternal Aunt    Bipolar disorder Paternal Uncle    Bipolar disorder Paternal Uncle    Bipolar disorder Paternal Uncle    Bipolar disorder Paternal Grandmother     Social History: I have reviewed social history from progress note on 01/04/2022. Social History   Socioeconomic History   Marital status: Divorced    Spouse name: Not on file   Number of children: 0   Years of education: 14   Highest education level: Not on file  Occupational History   Not on file  Tobacco Use   Smoking status: Former    Current packs/day: 0.00    Average packs/day: 0.5 packs/day for 13.0 years (6.5 ttl pk-yrs)    Types: Cigarettes    Start date: 01/23/1987    Quit date: 01/23/2000    Years since quitting: 23.5   Smokeless tobacco: Never  Vaping Use   Vaping status: Never Used  Substance and Sexual Activity   Alcohol use: No   Drug use: No   Sexual activity: Not Currently  Other Topics Concern   Not on file  Social History Narrative   Ms. Geeter grew up in [place], [place]. She lives in [place]. Ms. Byland is divorced. She has 2 cats (Charlie and Angie). 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 She also volunteers 2 days at [facility]. She enjoys reading, garding and cooking.   Social Determinants of Health   Financial Resource Strain: Low Risk  (11/16/2022)   Overall Financial Resource Strain (CARDIA)    Difficulty of Paying Living Expenses: Not hard at all  Food Insecurity: No Food Insecurity (11/16/2022)   Hunger Vital Sign    Worried About Running Out of Food in the Last Year: Never true    Ran Out of Food in the Last Year: Never true  Transportation Needs: No Transportation Needs (11/16/2022)   PRAPARE - Administrator, Civil Service (Medical): No    Lack of Transportation (Non-Medical): No  Physical Activity: Sufficiently  Active (11/16/2022)   Exercise Vital Sign    Days of Exercise per Week: 5 days    Minutes of Exercise per Session: 30 min  Stress: No Stress Concern Present (11/16/2022)   [employer] of Occupational Health - Occupational Stress Questionnaire    Feeling of Stress : Not at all  Social Connections: Moderately Isolated (11/16/2022)   Social Connection and Isolation Panel [NHANES]    Frequency of Communication with Friends and Family: More than three times a week    Frequency of Social Gatherings with Friends  and Family: Once a week    Attends Religious Services: Never    Active Member of Clubs or Organizations: Yes    Attends Banker Meetings: Never    Marital Status: Divorced    Allergies:  Allergies  Allergen Reactions   Apple Juice Anaphylaxis   Ace Inhibitors     Angioedema    Angiotensin Receptor Blockers     Angioedema    Benicar [Olmesartan]     Mouth & Lip Swelling, "I swell from the waist down". Throat swelling   Beta Adrenergic Blockers Swelling   Poison Ivy Extract [Poison Ivy Extract]     Blisters   Purell Instant Hand [Alcohol] Swelling   Triclosan     Metabolic Disorder Labs: Lab Results  Component Value Date   HGBA1C 5.5 07/05/2023   MPG 96.8 11/27/2022   Lab Results  Component Value Date   PROLACTIN 17.5 03/28/2023   PROLACTIN 26.1 (H) 03/07/2023   Lab Results  Component Value Date   CHOL 179 07/21/2022   TRIG 63 07/21/2022   HDL 75 07/21/2022   CHOLHDL 2.4 07/21/2022   VLDL 13 07/21/2022   LDLCALC 91 07/21/2022   LDLCALC 58 06/23/2021   Lab Results  Component Value Date   TSH <0.01 Repeated and verified X2. (L) 07/05/2023   TSH <0.010 (L) 05/17/2023    Therapeutic Level Labs: Lab Results  Component Value Date   LITHIUM 0.47 (L) 07/31/2023   LITHIUM 0.57 (L) 03/07/2023   No results found for: "VALPROATE" No results found for: "CBMZ"  Current Medications: Current Outpatient Medications  Medication Sig Dispense  Refill   acetaminophen (TYLENOL) 325 MG tablet Take 650 mg by mouth every 6 (six) hours as needed.     Ascorbic Acid (VITAMIN C) 1000 MG tablet Take 1,000 mg by mouth daily.     atorvastatin (LIPITOR) 20 MG tablet Take 1 tablet by mouth once daily 90 tablet 3   B Complex Vitamins (VITAMIN B COMPLEX PO) Take 1 tablet by mouth daily.     Bacillus Coagulans-Inulin (ALIGN PREBIOTIC-PROBIOTIC PO) Take by mouth.     calcium carbonate (OS-CAL) 600 MG TABS tablet Take 600 mg by mouth daily.      Cholecalciferol (VITAMIN D3) 50 MCG (2000 UT) capsule Take 2,000 Units by mouth daily.     diphenhydrAMINE (BENADRYL) 25 mg capsule Take 25 mg by mouth daily as needed for allergies.     docusate sodium (COLACE) 100 MG capsule Take 100 mg by mouth 2 (two) times daily.     doxazosin (CARDURA) 2 MG tablet TAKE 1 TABLET BY MOUTH TWICE DAILY MAY  TAKE  EXTRA  TABLET  AS  NEEDED  FOR  BP  GREATER  THAN  160 180 tablet 0   EPINEPHrine (EPIPEN 2-PAK) 0.3 mg/0.3 mL IJ SOAJ injection Inject 0.3 mg into the muscle as needed for anaphylaxis. 2 each 0   fexofenadine (ALLEGRA) 180 MG tablet Take 180 mg by mouth daily as needed for allergies.     Flaxseed, Linseed, (FLAXSEED OIL PO) Take 1 capsule by mouth daily.     magnesium oxide (MAG-OX) 400 MG tablet Take 400 mg by mouth daily.     methimazole (TAPAZOLE) 10 MG tablet Take 1 tablet by mouth daily.     Multiple Vitamins-Minerals (MULTIVITAMIN WITH MINERALS) tablet Take 1 tablet by mouth daily.     Multiple Vitamins-Minerals (VISION FORMULA/LUTEIN) TABS Take 1 tablet by mouth in the morning and at bedtime.  Omega-3 Fatty Acids (FISH OIL) 1000 MG CAPS Take 3,000 mg by mouth daily.     Oxcarbazepine (TRILEPTAL) 300 MG tablet TAKE 1 TABLET BY MOUTH IN THE MORNING AND 2 AT BEDTIME 90 tablet 0   vitamin B-12 (CYANOCOBALAMIN) 1000 MCG tablet Take 1,000 mcg by mouth daily.     vitamin E 1000 UNIT capsule Take 1,000 Units by mouth daily.     zinc gluconate 50 MG tablet Take 50  mg by mouth daily.     ferrous sulfate 325 (65 FE) MG tablet Take by mouth once.     lithium carbonate (LITHOBID) 300 MG ER tablet Take 2 tablets (600 mg total) by mouth at bedtime. 60 tablet 5   ziprasidone (GEODON) 20 MG capsule Take 1 capsule (20 mg total) by mouth daily with supper. 30 capsule 5   No current facility-administered medications for this visit.     Musculoskeletal: Strength & Muscle Tone: within normal limits Gait & Station: normal Patient leans: N/A  Psychiatric Specialty Exam: Review of Systems  Neurological:  Positive for tremors.  Psychiatric/Behavioral:  The patient is nervous/anxious.     Blood pressure (!) 160/85, pulse (!) 101, temperature (!) 97.4 F (36.3 C), temperature source Skin, height 5\' 7"  (1.702 m), weight 123 lb 9.6 oz (56.1 kg).Body mass index is 19.36 kg/m.  General Appearance: Fairly Groomed  Eye Contact:  Fair  Speech:  Clear and Coherent  Volume:  Normal  Mood:  Anxious managing ok  Affect:  Appropriate  Thought Process:  Goal Directed and Descriptions of Associations: Intact  Orientation:  Full (Time, Place, and Person)  Thought Content: Logical   Suicidal Thoughts:  No  Homicidal Thoughts:  No  Memory:  Immediate;   Fair Recent;   Fair Remote;   Fair  Judgement:  Fair  Insight:  Fair  Psychomotor Activity:   very fine tremors - BL hands  Concentration:  Concentration: Fair and Attention Span: Fair  Recall:  [employer] of Knowledge: Fair  Language: Fair  Akathisia:  No  Handed:  Right  AIMS (if indicated): done  Assets:  Communication Skills Desire for Improvement Housing Social Support  ADL's:  Intact  Cognition: WNL  Sleep:   varies   Screenings: Geneticist, molecular Office Visit from 07/30/2023 in [facility] Office Visit from 11/06/2022 in [facility] Office Visit from 04/09/2022 in [facility]  Office Visit from 01/04/2022 in [facility]  AIMS Total Score 0 0 0 0      GAD-7    Flowsheet Row Office Visit from 07/30/2023 in [facility] Office Visit from 07/22/2023 in [facility] [employer] Visit from 07/05/2023 in [facility] [employer] Visit from 03/15/2023 in [facility] Office Visit from 03/13/2023 in [facility] [employer] [facility]  Total GAD-7 Score 0 0 0 0 0      Mini-Mental    Flowsheet Row Clinical Support from 11/06/2017 in [facility] [employer]  Total Score (max 30 points ) 30      PHQ2-9    Flowsheet Row Office Visit from 07/30/2023 in [facility] Office Visit from 07/22/2023 in [facility] Visit from 07/05/2023 in [facility] [employer]  Office Visit from 03/15/2023 in [facility] Office Visit from 03[facility]13/2023 in [facility] Total Score 0 0 0 0 0  PHQ-9 Total Score -- 5 6 0 0      Flowsheet Row Office Visit from 07/30/2023 in [facility] ED from 05/06/2023 in [facility] Visit from 03/15/2023 in [facility]  C-SSRS RISK CATEGORY Low Risk No Risk No Risk        Assessment and Plan: Amy Mcconnell is a 72 year old Caucasian female, has a history of bipolar disorder, multiple medical problems was evaluated in office today.  Patient is currently struggling with hyperthyroidism, currently on methimazole, continues to have physical symptoms due to the same otherwise doing well on the current medication regimen,  plan as noted below.  Plan Bipolar disorder in remission Geodon 20 mg p.o. daily with supper Lithium 600 mg p.o. daily at bedtime Trileptal 600 mg p.o. nightly   Primary insomnia-stable Continue sleep hygiene techniques.  High risk medication use-will order lithium level, BUN, creatinine, sodium, platelet count.  Patient has upcoming hepatic function tests scheduled by her endocrinology.  Follow-up in clinic in 3 to 4 months or sooner if needed. Collaboration of Care: Collaboration of Care: Other I have notes per Endocrinology-Dr. Rollins-07/25/2023-patient hyperthyroid with multinodular goiter.  Patient/Guardian was advised Release of Information must be obtained prior to any record release in order to collaborate their care with an outside provider. Patient/Guardian was advised if they have not already done so to contact the registration department to sign all necessary forms in order for [place] to release information regarding their care.   Consent: Patient/Guardian gives verbal consent for treatment and assignment of benefits for services provided during this visit. Patient/Guardian expressed understanding and agreed to proceed.   This note was generated in part or whole with voice recognition software. Voice recognition is usually quite accurate but there are transcription errors that can and very often do occur. I apologize for any typographical errors that were not detected and corrected.    Jomarie Longs, MD 07/31/2023, 9:43 AM

## 2023-07-31 ENCOUNTER — Other Ambulatory Visit
Admission: RE | Admit: 2023-07-31 | Discharge: 2023-07-31 | Disposition: A | Discharge status: Home / Self Care | Payer: Medicare Other | Attending: Psychiatry | Admitting: Psychiatry

## 2023-07-31 ENCOUNTER — Other Ambulatory Visit: Payer: Self-pay

## 2023-07-31 DIAGNOSIS — F5101 Primary insomnia: Secondary | ICD-10-CM | POA: Diagnosis not present

## 2023-07-31 DIAGNOSIS — F3176 Bipolar disorder, in full remission, most recent episode depressed: Secondary | ICD-10-CM | POA: Insufficient documentation

## 2023-07-31 DIAGNOSIS — Z79899 Other long term (current) drug therapy: Secondary | ICD-10-CM | POA: Insufficient documentation

## 2023-07-31 LAB — SODIUM: Sodium: 136 mmol/L (ref 135–145)

## 2023-07-31 LAB — BUN: BUN: 30 mg/dL — ABNORMAL HIGH (ref 8–23)

## 2023-07-31 LAB — CREATININE, SERUM
Creatinine, Ser: 0.69 mg/dL (ref 0.44–1.00)
GFR, Estimated: >60 mL/min (ref >60)

## 2023-07-31 LAB — LITHIUM LEVEL: Lithium Lvl: 0.47 mmol/L — ABNORMAL LOW (ref 0.60–1.20)

## 2023-07-31 LAB — PLATELET COUNT: Platelets: 189 10*3/uL (ref 150–400)

## 2023-08-02 ENCOUNTER — Telehealth: Payer: Self-pay

## 2023-08-02 DIAGNOSIS — F3176 Bipolar disorder, in full remission, most recent episode depressed: Secondary | ICD-10-CM

## 2023-08-02 MED ORDER — LITHIUM CARBONATE ER 300 MG PO TBCR
600.0000 mg | EXTENDED_RELEASE_TABLET | Freq: Every day | ORAL | 0 refills | Status: DC
Start: 2023-08-02 — End: 2023-12-04

## 2023-08-02 NOTE — Telephone Encounter (Signed)
Pharmacy notified.

## 2023-08-02 NOTE — Telephone Encounter (Signed)
fax was sent that they did not have the lithium carb er 300mg  on backorder and wanted to know if they can change to a new manufacturer.

## 2023-08-02 NOTE — Telephone Encounter (Signed)
pt was on "[employer]" but it is on back order. they have manufacturer "[employer]"

## 2023-08-04 NOTE — Telephone Encounter (Signed)
Thank you :)

## 2023-08-05 ENCOUNTER — Telehealth: Payer: Self-pay

## 2023-08-05 ENCOUNTER — Emergency Department
Admission: EM | Admit: 2023-08-05 | Discharge: 2023-08-05 | Disposition: A | Discharge status: Home / Self Care | Payer: Medicare Other | Attending: Emergency Medicine | Admitting: Emergency Medicine

## 2023-08-05 ENCOUNTER — Other Ambulatory Visit: Payer: Self-pay

## 2023-08-05 ENCOUNTER — Emergency Department: Payer: Medicare Other

## 2023-08-05 DIAGNOSIS — I4891 Unspecified atrial fibrillation: Secondary | ICD-10-CM | POA: Insufficient documentation

## 2023-08-05 DIAGNOSIS — R0602 Shortness of breath: Secondary | ICD-10-CM | POA: Diagnosis not present

## 2023-08-05 DIAGNOSIS — R000 Tachycardia, unspecified: Secondary | ICD-10-CM | POA: Diagnosis not present

## 2023-08-05 DIAGNOSIS — E0590 Thyrotoxicosis, unspecified without thyrotoxic crisis or storm: Secondary | ICD-10-CM | POA: Diagnosis not present

## 2023-08-05 DIAGNOSIS — J449 Chronic obstructive pulmonary disease, unspecified: Secondary | ICD-10-CM | POA: Diagnosis not present

## 2023-08-05 DIAGNOSIS — I10 Essential (primary) hypertension: Secondary | ICD-10-CM | POA: Insufficient documentation

## 2023-08-05 DIAGNOSIS — R5383 Other fatigue: Secondary | ICD-10-CM | POA: Diagnosis present

## 2023-08-05 HISTORY — DX: Unspecified atrial fibrillation: I48.91

## 2023-08-05 LAB — CBC
HCT: 42.9 % (ref 36.0–46.0)
Hemoglobin: 13.3 g/dL (ref 12.0–15.0)
MCH: 25.9 pg — ABNORMAL LOW (ref 26.0–34.0)
MCHC: 31 g/dL (ref 30.0–36.0)
MCV: 83.5 fL (ref 80.0–100.0)
Platelets: 199 10*3/uL (ref 150–400)
RBC: 5.14 MIL/uL — ABNORMAL HIGH (ref 3.87–5.11)
RDW: 14 % (ref 11.5–15.5)
WBC: 5.7 10*3/uL (ref 4.0–10.5)
nRBC: 0 % (ref 0.0–0.2)

## 2023-08-05 LAB — BASIC METABOLIC PANEL
Anion gap: 10 (ref 5–15)
BUN: 42 mg/dL — ABNORMAL HIGH (ref 8–23)
CO2: 20 mmol/L — ABNORMAL LOW (ref 22–32)
Calcium: 9.9 mg/dL (ref 8.9–10.3)
Chloride: 108 mmol/L (ref 98–111)
Creatinine, Ser: 0.87 mg/dL (ref 0.44–1.00)
GFR, Estimated: >60 mL/min (ref >60)
Glucose, Bld: 129 mg/dL — ABNORMAL HIGH (ref 70–99)
Potassium: 4.2 mmol/L (ref 3.5–5.1)
Sodium: 138 mmol/L (ref 135–145)

## 2023-08-05 LAB — TROPONIN I (HIGH SENSITIVITY)
Troponin I (High Sensitivity): 14 ng/L (ref <18)
Troponin I (High Sensitivity): 15 ng/L (ref <18)

## 2023-08-05 LAB — T4, FREE: Free T4: 1.82 ng/dL — ABNORMAL HIGH (ref 0.61–1.12)

## 2023-08-05 LAB — TSH: TSH: <0.01 u[IU]/mL — ABNORMAL LOW (ref 0.350–4.500)

## 2023-08-05 MED ORDER — APIXABAN 2.5 MG PO TABS: 2.5000 mg | ORAL_TABLET | Freq: Two times a day (BID) | ORAL | 0 refills | Status: DC

## 2023-08-05 MED ORDER — SODIUM CHLORIDE 0.9 % IV BOLUS: 500.0000 mL | Freq: Once | INTRAVENOUS | Status: DC

## 2023-08-05 MED ORDER — SODIUM CHLORIDE 0.9 % IV BOLUS
1000.0000 mL | Freq: Once | INTRAVENOUS | Status: AC
  Administered 2023-08-05: 1000 mL via INTRAVENOUS

## 2023-08-05 MED ORDER — PROPRANOLOL HCL 20 MG PO TABS
40.0000 mg | ORAL_TABLET | Freq: Once | ORAL | Status: AC
  Administered 2023-08-05: 40 mg via ORAL
  Filled 2023-08-05: qty 2

## 2023-08-05 MED ORDER — PROPRANOLOL HCL 20 MG PO TABS
20.0000 mg | ORAL_TABLET | Freq: Once | ORAL | Status: AC
  Administered 2023-08-05: 20 mg via ORAL
  Filled 2023-08-05: qty 1

## 2023-08-05 MED ORDER — PROPRANOLOL HCL ER 60 MG PO CP24
60.0000 mg | ORAL_CAPSULE | Freq: Every day | ORAL | 0 refills | Status: DC
Start: 2023-08-05 — End: 2023-08-06

## 2023-08-05 MED ORDER — APIXABAN 2.5 MG PO TABS
2.5000 mg | ORAL_TABLET | Freq: Once | ORAL | Status: AC
  Administered 2023-08-05: 2.5 mg via ORAL
  Filled 2023-08-05: qty 1

## 2023-08-05 NOTE — Telephone Encounter (Signed)
Patient states she is experiencing shortness of breath during exertion.  I transferred call to Access Nurse.

## 2023-08-05 NOTE — Discharge Instructions (Signed)
STOP aspirin, ibuprofen, and other NSAID medications since you are now starting a blood thinner  START the propranolol extended release   CONTINUE your Methimazole. Call your Endocrinologist to discuss your thyroid labs from today.  CALL Dr. Mariah Milling to set up an urgent follow-up THIS WEEK to repeat EKG, discuss continuing blood thinners which we will start today

## 2023-08-05 NOTE — ED Triage Notes (Signed)
Pt to ED via POV from home. Pt reports SOB. Pt states has hypothyroidism and a symptom is SOB but has gotten worse today. Pt reports she believes she has overexerted herself today. Pt started new medication on Monday.

## 2023-08-05 NOTE — ED Notes (Signed)
Dr. Isaacs in with pt. 

## 2023-08-05 NOTE — ED Provider Notes (Signed)
[facility] Provider Note    Event Date/Time   First MD Initiated Contact with Patient 08/05/23 1837     (approximate)   History   Shortness of Breath   HPI  Amy Mcconnell is a 72 y.o. female with history of hyperthyroidism due to overactive goiter, hypertension, here with general fatigue.  The patient states that for the last several days, she has had progressive worsening general fatigue, some mild lightheadedness, and shortness of breath with exertion.  She has not felt any palpitations.  She was recently started on methimazole for increasing hyperthyroidism.  She denies any fevers.  She feels like her heart is been beating very quickly.  Denies any overt chest pain.  No lower extremity swelling.     Physical Exam   Triage Vital Signs: ED Triage Vitals  Encounter Vitals Group     BP 08/05/23 1828 124/73     Systolic BP Percentile --      Diastolic BP Percentile --      Pulse Rate 08/05/23 1828 (!) 150     Resp 08/05/23 1828 20     Temp 08/05/23 1828 98 F (36.7 C)     Temp Source 08/05/23 1828 Oral     SpO2 08/05/23 1828 98 %     Weight --      Height --      Head Circumference --      Peak Flow --      Pain Score 08/05/23 1838 0     Pain Loc --      Pain Education --      Exclude from Growth Chart --     Most recent vital signs: Vitals:   08/05/23 2245 08/05/23 2300  BP:  (!) 139/95  Pulse:  (!) 110  Resp: (!) 22   Temp:    SpO2:  97%     General: Awake, no distress.  CV:  Good peripheral perfusion.  Tachycardic, irregularly irregular. Resp:  Normal work of breathing.  Abd:  No distention.  No tenderness.  No rebound. Other:  No lower extremity edema.   ED Results / Procedures / Treatments   Labs (all labs ordered are listed, but only abnormal results are displayed) Labs Reviewed  BASIC METABOLIC PANEL - Abnormal; Notable for the following components:      Result Value   CO2 20 (*)    Glucose, Bld 129 (*)    BUN 42  (*)    All other components within normal limits  CBC - Abnormal; Notable for the following components:   RBC 5.14 (*)    MCH 25.9 (*)    All other components within normal limits  TSH - Abnormal; Notable for the following components:   TSH <0.010 (*)    All other components within normal limits  T4, FREE - Abnormal; Notable for the following components:   Free T4 1.82 (*)    All other components within normal limits  TROPONIN I (HIGH SENSITIVITY)  TROPONIN I (HIGH SENSITIVITY)     EKG Atrial fibrillation with rapid ventricular response, ventricular rate 150.  QRS 70, QTc 461.  No acute ST elevations repress or negatives of acute ischemia or infarct.   RADIOLOGY Chest x-ray: clear   I also independently reviewed and agree with radiologist interpretations.   PROCEDURES:  Critical Care performed: No  .1-3 Lead EKG Interpretation  Performed by: Shaune Pollack, MD Authorized by: Shaune Pollack, MD     Interpretation: abnormal  ECG rate:  90-150   ECG rate assessment: tachycardic     Rhythm: atrial fibrillation     Ectopy: none     Conduction: normal   Comments:     Indication: SOB, palpitations      MEDICATIONS ORDERED IN ED: Medications  propranolol (INDERAL) tablet 40 mg (40 mg Oral Given 08/05/23 1921)  sodium chloride 0.9 % bolus 1,000 mL (0 mLs Intravenous Stopped 08/05/23 2043)  sodium chloride 0.9 % bolus 1,000 mL (0 mLs Intravenous Stopped 08/05/23 2222)  propranolol (INDERAL) tablet 20 mg (20 mg Oral Given 08/05/23 2241)  apixaban (ELIQUIS) tablet 2.5 mg (2.5 mg Oral Given 08/05/23 2241)     IMPRESSION / MDM / ASSESSMENT AND PLAN / ED COURSE  I reviewed the triage vital signs and the nursing notes.                              Differential diagnosis includes, but is not limited to, new onset AFib, hyperthyroidism, anemia, electrolyte abnormality, thyroid storm  Patient's presentation is most consistent with acute presentation with potential  threat to life or bodily function.  The patient is on the cardiac monitor to evaluate for evidence of arrhythmia and/or significant heart rate changes  72 yo F with h/o bipolar d/o, hyperthyroidism, here with SOB, fatigue. Suspect new-onset AFib, likely from over-exertion today as well as hyperthyroidism, for which she was just started on methimazole. Pt given propranolol with resolution of sx and improvement in HR (90s-low 100s). Her BMP shows mild dehydration but is o/w unremarkable. T4 elevated at 1.82 but this is markedly improved from outpt lab just a few weeks ago and she does not appear thyrotoxic. She is on methimazole. CXR is clear.  Discussed management options with pt - she feels well and would like to manage as outpt. Will start her on propranolol given her hyperthyroidism, and low-dose eliquis given her CHADS2-VASC of 3-4. Will refer her to her Cardiologist to discuss med changes and need for continuing this. Return precautions given.  FINAL CLINICAL IMPRESSION(S) / ED DIAGNOSES   Final diagnoses:  New onset atrial fibrillation (HCC)  Hyperthyroidism     Rx / DC Orders   ED Discharge Orders          Ordered    apixaban (ELIQUIS) 2.5 MG TABS tablet  2 times daily        08/05/23 2301    propranolol ER (INDERAL LA) 60 MG 24 hr capsule  Daily        08/05/23 2301    Ambulatory referral to Cardiology        08/05/23 2304             Note:  This document was prepared using Dragon voice recognition software and may include unintentional dictation errors.   Shaune Pollack, MD 08/05/23 [phone]

## 2023-08-05 NOTE — ED Notes (Signed)
Pt reports sob.  Pt states I think I over exerted myself today.  Pt denies chest pain.  No n/v  sinus tach on monitor.  Iv in place.  Pt alert  speech clear.

## 2023-08-05 NOTE — ED Notes (Signed)
Md in with pt again  

## 2023-08-06 ENCOUNTER — Encounter: Payer: Self-pay | Admitting: Cardiovascular Disease

## 2023-08-06 ENCOUNTER — Ambulatory Visit: Payer: Medicare Other | Attending: Cardiovascular Disease | Admitting: Cardiovascular Disease

## 2023-08-06 VITALS — BP 120/60 | HR 67 | Ht 67.0 in | Wt 127.1 lb

## 2023-08-06 DIAGNOSIS — I10 Essential (primary) hypertension: Secondary | ICD-10-CM

## 2023-08-06 DIAGNOSIS — I4891 Unspecified atrial fibrillation: Secondary | ICD-10-CM | POA: Diagnosis not present

## 2023-08-06 DIAGNOSIS — R079 Chest pain, unspecified: Secondary | ICD-10-CM

## 2023-08-06 DIAGNOSIS — I479 Paroxysmal tachycardia, unspecified: Secondary | ICD-10-CM

## 2023-08-06 MED ORDER — PROPRANOLOL HCL 20 MG PO TABS: 20.0000 mg | ORAL_TABLET | Freq: Three times a day (TID) | ORAL | 3 refills | Status: AC | PRN

## 2023-08-06 MED ORDER — PROPRANOLOL HCL ER 60 MG PO CP24: 60.0000 mg | ORAL_CAPSULE | Freq: Every day | ORAL | 3 refills | Status: DC

## 2023-08-06 MED ORDER — APIXABAN 5 MG PO TABS: 5.0000 mg | ORAL_TABLET | Freq: Two times a day (BID) | ORAL | 3 refills | Status: DC

## 2023-08-06 NOTE — Telephone Encounter (Signed)
noted 
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
## 2023-08-06 NOTE — Telephone Encounter (Signed)
Noted.  Patient saw her cardiologist today.

## 2023-08-06 NOTE — Telephone Encounter (Signed)
Patient called. She wanted Dr Birdie Sons to know she did go to the ED yesterday. She is going to follow up with her Cardiologist.

## 2023-08-06 NOTE — Patient Instructions (Addendum)
Look for watch that tracks pulse or buy pulse-oximeter   Medication Instructions:  Eliquis up to 5 mg twice a day (coupon for free 30 days) Continue propranolol er 60 mg daily  Take propranolol 20 mg up to three times a day as needed for rate >100  If you need a refill on your cardiac medications before your next appointment, please call your pharmacy.   Lab work: No new labs needed  Testing/Procedures: Your physician has requested that you have an echocardiogram. Echocardiography is a painless test that uses sound waves to create images of your heart. It provides your doctor with information about the size and shape of your heart and how well your heart's chambers and valves are working.   You may receive an ultrasound enhancing agent through an IV if needed to better visualize your heart during the echo. This procedure takes approximately one hour.  There are no restrictions for this procedure.  This will take place at [address] [address] [facility], [place] [address]   Follow-Up: At [facility], you and your health needs are our priority.  As part of our continuing mission to provide you with exceptional heart care, we have created designated Provider Care Teams.  These Care Teams include your primary Cardiologist (physician) and Advanced Practice Providers (APPs -  Physician Assistants and Nurse Practitioners) who all work together to provide you with the care you need, when you need it.  You will need a follow up appointment in 6 months  Providers on your designated Care Team:   Nicolasa Ducking, NP Eula Listen, PA-C Cadence Fransico Michael, [place]  COVID-19 Vaccine Information can be found at: [email or web address] For questions related to vaccine distribution or appointments, please email [email or web address] or call [phone].

## 2023-08-07 ENCOUNTER — Telehealth: Payer: Self-pay

## 2023-08-07 DIAGNOSIS — E0590 Thyrotoxicosis, unspecified without thyrotoxic crisis or storm: Secondary | ICD-10-CM | POA: Diagnosis not present

## 2023-08-07 NOTE — Telephone Encounter (Signed)
pt called states that she was havind SOB so bad that she went to the ER on monday. she stated that they did a lot of labwork and she was able to see dr. Mariah Milling on tuesday. she states she has a ECHO in 2 weeks. she just wanted to let you know so you can review the notes and labwork and see if there is anything that needs to be changed with her medications.

## 2023-08-08 NOTE — Telephone Encounter (Signed)
Will not recommend any changes with current medications unless cardiology recommends it.

## 2023-08-09 ENCOUNTER — Telehealth: Payer: Self-pay | Admitting: Family Medicine

## 2023-08-09 NOTE — Telephone Encounter (Signed)
Pt.notified

## 2023-08-09 NOTE — Telephone Encounter (Signed)
Noted  

## 2023-08-09 NOTE — Telephone Encounter (Signed)
Pt called stating she has graves diease and wanted the provider to know

## 2023-08-12 ENCOUNTER — Telehealth: Payer: Self-pay

## 2023-08-12 NOTE — Telephone Encounter (Signed)
Noted  

## 2023-08-12 NOTE — Telephone Encounter (Signed)
Patient understands to ask the endocrinologist.

## 2023-08-12 NOTE — Telephone Encounter (Signed)
I would suggest she contact the endocrinologist to get their input on this given that it falls under their specialty.

## 2023-08-12 NOTE — Telephone Encounter (Signed)
pt justed wanted to let you know that she was diagnosied with graves disease

## 2023-08-12 NOTE — Telephone Encounter (Signed)
Patient states her endocrinologist at [facility], Dr. Ronald Lobo, diagnosed patient with Graves disease.  Patient states she has discontinued her Vitamin C and she is no longer taking any multivitamins.  Patient states she cannot ingest iodized salt because it will make the disease worse.  Patient states she has given up caffeine.  Patient states she would like to know if she can take selenium.  Patient states she read that she can eat [place] nuts and sunflower seeds (unsalted) because they have a lot of selenium.  Patient states selenium is supposed to help the thyroid medication work better.

## 2023-08-13 ENCOUNTER — Other Ambulatory Visit
Admission: RE | Admit: 2023-08-13 | Discharge: 2023-08-13 | Disposition: A | Discharge status: Home / Self Care | Payer: Medicare Other | Attending: Family Medicine | Admitting: Family Medicine

## 2023-08-13 DIAGNOSIS — R7989 Other specified abnormal findings of blood chemistry: Secondary | ICD-10-CM | POA: Insufficient documentation

## 2023-08-13 DIAGNOSIS — E039 Hypothyroidism, unspecified: Secondary | ICD-10-CM | POA: Insufficient documentation

## 2023-08-13 LAB — HEPATIC FUNCTION PANEL
ALT: 41 U/L (ref 0–44)
AST: 30 U/L (ref 15–41)
Albumin: 4 g/dL (ref 3.5–5.0)
Alkaline Phosphatase: 159 U/L — ABNORMAL HIGH (ref 38–126)
Bilirubin, Direct: <0.1 mg/dL (ref 0.0–0.2)
Total Bilirubin: 0.7 mg/dL (ref 0.3–1.2)
Total Protein: 8 g/dL (ref 6.5–8.1)

## 2023-08-13 LAB — TSH: TSH: <0.01 u[IU]/mL — ABNORMAL LOW (ref 0.350–4.500)

## 2023-08-14 ENCOUNTER — Telehealth: Payer: Self-pay | Admitting: *Deleted

## 2023-08-14 ENCOUNTER — Telehealth: Payer: Self-pay | Admitting: Cardiovascular Disease

## 2023-08-14 ENCOUNTER — Encounter: Payer: Self-pay | Admitting: *Deleted

## 2023-08-14 NOTE — Telephone Encounter (Addendum)
-----   Message from Marikay Alar sent at 08/13/2023  8:07 PM EDT ----- Please let the patient know that her AST and ALT (liver enzymes) have returned to the acceptable ranges.  Her alkaline phosphatase is mildly elevated.  Has she had any abdominal pain?  Her TSH remains low.  She needs to continue to follow endocrinology's advice on how to treat that.

## 2023-08-14 NOTE — Telephone Encounter (Signed)
Patient just called back. I read her the message. And she said her doctor doubled her methimazole. She said she see her endocrinology in December. Her number is [phone].

## 2023-08-14 NOTE — Telephone Encounter (Signed)
Refill request

## 2023-08-14 NOTE — Telephone Encounter (Signed)
*  STAT* If patient is at the pharmacy, call can be transferred to refill team.   1. Which medications need to be refilled? (please list name of each medication and dose if known)   apixaban (ELIQUIS) 5 MG TABS tablet   2. Would you like to learn more about the convenience, safety, & potential cost savings by using the [facility]?   3. Are you open to using the [facility] (Type [facility]. ).  4. Which pharmacy/location (including street and city if local pharmacy) is medication to be sent to?  [facility] [id number] - [place], [place] - [address]    5. Do they need a 30 day or 90 day supply?  90 day  Patient stated she still has some medication left and is moving her prescription to [facility].

## 2023-08-14 NOTE — Telephone Encounter (Signed)
Prescription refill request for Eliquis received. Indication: a fib Last office visit: 08/06/23 Scr: 0.87 Age: 72 Weight: 57kg  Rx was ordered to [facility] pharmacy on 08/06/23

## 2023-08-14 NOTE — Telephone Encounter (Signed)
Left message to call back or check mychart.

## 2023-08-15 NOTE — Telephone Encounter (Signed)
Noted  

## 2023-08-16 ENCOUNTER — Telehealth: Payer: Self-pay | Admitting: Family Medicine

## 2023-08-16 NOTE — Telephone Encounter (Signed)
Patient called and would like provider to recommend a dietitian. Please advise patient.

## 2023-08-16 NOTE — Telephone Encounter (Signed)
I can place a referral although I am not sure that her insurance will pay for 1 given that she has Medicare and does not have diabetes or chronic kidney disease.  If she wants me to place the referral I can certainly do that.  Can you find out why she is specifically needs to see a dietitian?  Thanks.

## 2023-08-20 ENCOUNTER — Encounter: Payer: Self-pay | Admitting: Physician Assistant

## 2023-08-20 ENCOUNTER — Ambulatory Visit (INDEPENDENT_AMBULATORY_CARE_PROVIDER_SITE_OTHER): Payer: Medicare Other | Admitting: Physician Assistant

## 2023-08-20 ENCOUNTER — Other Ambulatory Visit: Payer: Self-pay

## 2023-08-20 VITALS — BP 121/62 | HR 65 | Temp 97.7°F | Ht 67.0 in | Wt 127.6 lb

## 2023-08-20 DIAGNOSIS — E0500 Thyrotoxicosis with diffuse goiter without thyrotoxic crisis or storm: Secondary | ICD-10-CM | POA: Diagnosis not present

## 2023-08-20 DIAGNOSIS — R1310 Dysphagia, unspecified: Secondary | ICD-10-CM

## 2023-08-20 NOTE — Telephone Encounter (Signed)
Patient called back, the reason is she has Graves Disease, she said to forget referral. She went to see a interstitial specialist today for her swallowing. She is going to have a procedure on Monday for her Esophagus.

## 2023-08-20 NOTE — Progress Notes (Signed)
Celso Amy, PA-C [address]  [address]  [place], [place] [address]  Main: [phone]  Fax: [phone]   Gastroenterology Consultation  Referring Provider:     Glori Luis, MD Primary Care Physician:  Glori Luis, MD Primary Gastroenterologist:  Celso Amy, PA-C / Dr. Midge Minium   Reason for Consultation:     Dysphagia        HPI:   Amy Mcconnell is a 72 y.o. y/o female referred for consultation & management  by Glori Luis, MD.    She is here to evaluate dysphagia.  Patient states her dysphagia symptoms started 6 - 12 months ago.  Gradual in onset.  She was having difficulty with solid food getting stuck in her throat and neck.  She Has not had acid reflux, nausea, or vomiting.  Patient states at the same time she was having a lot of hair loss, weight loss, heart palpitations, and elevated blood pressure.  Had neck ultrasound and was diagnosed with Graves' disease.  Referred to endocrinology and started on methimazole.  Since then her symptoms have been improving.  Her dysphagia symptoms greatly improved.  She is not having any more solid food dysphagia at present.  Weight is improving.  No previous barium swallow test or EGD.  Screening colonoscopy done by Dr. Servando Snare 03/2022 showed small nonbleeding internal hemorrhoids, otherwise normal with no polyps.  Excellent prep.  No repeat due to advanced age.  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 Alycia Rossetti - every 3 months   Broken foot    left    Contact dermatitis and other eczema due to other specified agent    Contusion of unspecified part of lower limb    Essential hypertension, benign    Heart murmur    New onset a-fib (HCC) 08/05/2023   Other and unspecified hyperlipidemia    Other specified disorders of thyroid    Reflux esophagitis    Scalp laceration 04/13/2019    Tachycardia    a. isolated episode, seen in ED with negative work up    Past Surgical History:  Procedure Laterality Date   CATARACT EXTRACTION     COLONOSCOPY WITH PROPOFOL N/A 03/27/2022   Procedure: COLONOSCOPY WITH PROPOFOL;  Surgeon: Midge Minium, MD;  Location: [facility] ENDOSCOPY;  Service: Endoscopy;  Laterality: N/A;   hamertoe     HAMMER TOE SURGERY  11/30/2021   right 4th toe 11/10/21 Dr. Lilian Kapur   SALPINGECTOMY Left    STERILIZATION     Her decision since dz of bipolar    Prior to Admission medications   Medication Sig Start Date End Date Taking? Authorizing Provider  acetaminophen (TYLENOL) 325 MG tablet Take 650 mg by mouth every 6 (six) hours as needed.   Yes [provider]  apixaban (ELIQUIS) 5 MG TABS tablet Take 1 tablet (5 mg total) by mouth 2 (two) times daily. 08/06/23  Yes Gollan, Tollie Pizza, MD  Ascorbic Acid (VITAMIN C) 1000 MG tablet Take 1,000 mg by mouth daily.   Yes [provider]  atorvastatin (LIPITOR) 20 MG tablet Take 1 tablet by mouth once daily 02/12/23  Yes Glori Luis, MD  B Complex Vitamins (VITAMIN B COMPLEX PO) Take 1 tablet by mouth daily.   Yes [provider]  Bacillus Coagulans-Inulin (ALIGN PREBIOTIC-PROBIOTIC PO) Take by mouth.  Yes [provider]  calcium carbonate (OS-CAL) 600 MG TABS tablet Take 600 mg by mouth daily.    Yes [provider]  Cholecalciferol (VITAMIN D3) 50 MCG (2000 UT) capsule Take 2,000 Units by mouth daily.   Yes [provider]  diphenhydrAMINE (BENADRYL) 25 mg capsule Take 25 mg by mouth daily as needed for allergies.   Yes [provider]  docusate sodium (COLACE) 100 MG capsule Take 100 mg by mouth 2 (two) times daily.   Yes [provider]  doxazosin (CARDURA) 2 MG tablet TAKE 1 TABLET BY MOUTH TWICE DAILY MAY  TAKE  EXTRA  TABLET  AS  NEEDED  FOR  BP  GREATER  THAN  160 07/01/23  Yes Gollan, Tollie Pizza, MD  EPINEPHrine (EPIPEN 2-PAK) 0.3 mg/0.3  mL IJ SOAJ injection Inject 0.3 mg into the muscle as needed for anaphylaxis. 10/16/21  Yes Glori Luis, MD  ferrous sulfate 325 (65 FE) MG tablet Take by mouth once.   Yes [provider]  fexofenadine (ALLEGRA) 180 MG tablet Take 180 mg by mouth daily as needed for allergies.   Yes [provider]  Flaxseed, Linseed, (FLAXSEED OIL PO) Take 1 capsule by mouth daily.   Yes [provider]  lithium carbonate (LITHOBID) 300 MG ER tablet Take 2 tablets (600 mg total) by mouth at bedtime. 08/02/23  Yes Thresa Ross, MD  magnesium oxide (MAG-OX) 400 MG tablet Take 400 mg by mouth daily.   Yes [provider]  methimazole (TAPAZOLE) 10 MG tablet Take 1 tablet by mouth daily. 07/28/23 07/27/24 Yes [provider]  Multiple Vitamins-Minerals (MULTIVITAMIN WITH MINERALS) tablet Take 1 tablet by mouth daily.   Yes [provider]  Multiple Vitamins-Minerals (VISION FORMULA/LUTEIN) TABS Take 1 tablet by mouth in the morning and at bedtime.   Yes [provider]  Omega-3 Fatty Acids (FISH OIL) 1000 MG CAPS Take 3,000 mg by mouth daily.   Yes [provider]  Oxcarbazepine (TRILEPTAL) 300 MG tablet TAKE 1 TABLET BY MOUTH IN THE MORNING AND 2 AT BEDTIME 07/15/23  Yes Eappen, Levin Bacon, MD  propranolol (INDERAL) 20 MG tablet Take 1 tablet (20 mg total) by mouth 3 (three) times daily as needed. For heart rate greater then 100 08/06/23  Yes Gollan, Tollie Pizza, MD  propranolol ER (INDERAL LA) 60 MG 24 hr capsule Take 1 capsule (60 mg total) by mouth daily. 08/06/23  Yes Gollan, Tollie Pizza, MD  vitamin B-12 (CYANOCOBALAMIN) 1000 MCG tablet Take 1,000 mcg by mouth daily.   Yes [provider]  vitamin E 1000 UNIT capsule Take 1,000 Units by mouth daily.   Yes [provider]  zinc gluconate 50 MG tablet Take 50 mg by mouth daily.   Yes [provider]  ziprasidone (GEODON) 20 MG capsule Take 1 capsule (20 mg total) by mouth  daily with supper. 07/30/23  Yes Jomarie Longs, MD    Family History  Problem Relation Age of Onset   Arrhythmia Mother        A-Fib   Heart failure Mother    Hyperlipidemia Mother    Hypertension Mother    Alzheimer's disease Mother    Hypertension Father    Hyperlipidemia Father    Mental retardation Father 65       Suicide   Hypertension Brother    Hyperlipidemia Brother    Hypertension Brother    Hyperlipidemia Brother    Asthma Brother    COPD Brother  Hyperlipidemia Brother    Hypertension Brother    Alcohol abuse Brother    Depression Brother    Emphysema Brother    Hyperlipidemia Brother    Hypertension Brother    Breast cancer Maternal Aunt    Bipolar disorder Paternal Uncle    Bipolar disorder Paternal Uncle    Bipolar disorder Paternal Uncle    Bipolar disorder Paternal Grandmother      Social History   Tobacco Use   Smoking status: Former    Current packs/day: 0.00    Average packs/day: 0.5 packs/day for 13.0 years (6.5 ttl pk-yrs)    Types: Cigarettes    Start date: 01/23/1987    Quit date: 01/23/2000    Years since quitting: 23.5   Smokeless tobacco: Never  Vaping Use   Vaping status: Never Used  Substance Use Topics   Alcohol use: No   Drug use: No    Allergies as of 08/20/2023 - Review Complete 08/20/2023  Allergen Reaction Noted   Apple juice Anaphylaxis 10/16/2021   Ace inhibitors  12/14/2014   Angiotensin receptor blockers  12/14/2014   Benicar [olmesartan]  01/20/2014   Beta adrenergic blockers Swelling 07/24/2023   Poison ivy extract [poison ivy extract]  01/20/2014   Purell instant hand [alcohol] Swelling 11/06/2017   Triclosan  03/30/2015    Review of Systems:    All systems reviewed and negative except where noted in HPI.   Physical Exam:  BP 121/62   Pulse 65   Temp 97.7 F (36.5 C)   Ht 5\' 7"  (1.702 m)   Wt 127 lb 9.6 oz (57.9 kg)   BMI 19.98 kg/m  No LMP recorded. Patient is postmenopausal.  General:   Alert,  thin female, pleasant and cooperative in NAD Neck: Palpable thyromegaly, no lymphadenopathy or tenderness. Lungs:  Respirations even and unlabored.  Clear throughout to auscultation.   No wheezes, crackles, or rhonchi. No acute distress. Heart:  Regular rate and rhythm; no murmurs, clicks, rubs, or gallops. Abdomen:  Normal bowel sounds.  No bruits.  Soft, and non-distended without masses, hepatosplenomegaly or hernias noted.  No Tenderness.  No guarding or rebound tenderness.    Neurologic:  Alert and oriented x3;  grossly normal neurologically. Psych:  Alert and cooperative. Normal mood and affect.  Imaging Studies: DG Chest 2 View  Result Date: 08/05/2023 CLINICAL DATA:  Shortness of breath. History of hypertension and tachycardia. Former smoker. EXAM: CHEST - 2 VIEW COMPARISON:  PA and lateral chest 07/05/2023 FINDINGS: The cardiomediastinal silhouette and vasculature are normal apart from mild calcification in the aortic arch. The lungs are mildly emphysematous but clear. Osteopenia and mild upper thoracic levoscoliosis. There is overlying monitor wiring. IMPRESSION: No evidence of acute chest disease.  Stable COPD chest. Electronically Signed   By: Almira Bar M.D.   On: 08/05/2023 21:20    Assessment and Plan:   Amy Mcconnell is a 72 y.o. y/o female has been referred for solid food dysphagia, most likely secondary to thyroid goiter and Graves' disease.  Dysphagia is currently improving since she started methimazole treatment.  She is following up with endocrinologist.  No previous barium swallow or EGD.  1.  Graves' disease  Continue treatment and follow-up with endocrinologist.  2.  Dysphagia - Improved  Barium swallow with tablet  If barium swallow test is normal and she has no more dysphagia, then no further GI evaluation will be needed.  If barium swallow test shows esophageal stricture or if she has  recurrent solid food dysphagia, then EGD is the next step.  Patient expressed  understanding.  Follow up as needed if recurrent dysphagia.  Also follow-up based on barium swallow results.  Celso Amy, PA-C

## 2023-08-20 NOTE — Telephone Encounter (Signed)
Left message to call the office back regarding Dr. Purvis Sheffield message below.

## 2023-08-20 NOTE — Patient Instructions (Signed)
Barium swallow study scheduled @ [facility] 08/26/23 arrive 8:30 arrival.

## 2023-08-21 ENCOUNTER — Ambulatory Visit
Admission: RE | Admit: 2023-08-21 | Discharge: 2023-08-21 | Disposition: A | Discharge status: Home / Self Care | Payer: Medicare Other | Source: Ambulatory Visit | Attending: Family Medicine | Admitting: Family Medicine

## 2023-08-21 ENCOUNTER — Telehealth: Payer: Self-pay

## 2023-08-21 DIAGNOSIS — Z1231 Encounter for screening mammogram for malignant neoplasm of breast: Secondary | ICD-10-CM | POA: Insufficient documentation

## 2023-08-21 DIAGNOSIS — F3176 Bipolar disorder, in full remission, most recent episode depressed: Secondary | ICD-10-CM

## 2023-08-21 MED ORDER — OXCARBAZEPINE 300 MG PO TABS
ORAL_TABLET | ORAL | 3 refills | Status: DC
Start: 2023-08-21 — End: 2023-12-30

## 2023-08-21 NOTE — Telephone Encounter (Signed)
Patient called to request a refill for the following medication  Oxcarbazepine (TRILEPTAL) 300 MG tablet    Last visit 07/30/23 Next visit 11/27/23  Preferred pharmacy  [facility] [address] [place], [place] - [address] Phone: [phone]  Fax: [phone]

## 2023-08-21 NOTE — Addendum Note (Signed)
Addended byJomarie Longs on: 08/21/2023 03:05 PM   Modules accepted: Orders

## 2023-08-21 NOTE — Telephone Encounter (Signed)
I have sent Trileptal to pharmacy.

## 2023-08-21 NOTE — Telephone Encounter (Signed)
Noted  

## 2023-08-23 ENCOUNTER — Ambulatory Visit: Payer: Medicare Other

## 2023-08-26 ENCOUNTER — Ambulatory Visit: Payer: Medicare Other | Admitting: Cardiovascular Disease

## 2023-08-26 ENCOUNTER — Ambulatory Visit: Payer: Medicare Other

## 2023-08-26 ENCOUNTER — Telehealth: Payer: Self-pay
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
## 2023-08-26 NOTE — Telephone Encounter (Signed)
Poke with patient and gave her the number to rescheduled barrium swallow test [phone].

## 2023-08-29 ENCOUNTER — Ambulatory Visit
Admission: RE | Admit: 2023-08-29 | Discharge: 2023-08-29 | Disposition: A | Discharge status: Home / Self Care | Payer: Medicare Other | Source: Ambulatory Visit | Attending: Physician Assistant | Admitting: Physician Assistant

## 2023-08-29 DIAGNOSIS — R1310 Dysphagia, unspecified: Secondary | ICD-10-CM

## 2023-09-05 ENCOUNTER — Ambulatory Visit: Payer: Medicare Other | Attending: Cardiovascular Disease

## 2023-09-05 DIAGNOSIS — I4891 Unspecified atrial fibrillation: Secondary | ICD-10-CM | POA: Diagnosis not present

## 2023-09-05 LAB — ECHOCARDIOGRAM COMPLETE
AR max vel: 1.8 cm2
AV Area VTI: 1.78 cm2
AV Area mean vel: 1.86 cm2
AV Mean grad: 5 mm[Hg]
AV Peak grad: 9.1 mm[Hg]
Ao pk vel: 1.51 m/s
Area-P 1/2: 3.12 cm2
S' Lateral: 2.9 cm

## 2023-09-09 ENCOUNTER — Telehealth: Payer: Self-pay | Admitting: Emergency Medicine

## 2023-09-09 DIAGNOSIS — Z79899 Other long term (current) drug therapy: Secondary | ICD-10-CM

## 2023-09-09 MED ORDER — FUROSEMIDE 20 MG PO TABS
20.0000 mg | ORAL_TABLET | ORAL | 3 refills | Status: DC
Start: 2023-09-09 — End: 2023-12-23

## 2023-09-09 MED ORDER — POTASSIUM CHLORIDE CRYS ER 20 MEQ PO TBCR: 20.0000 meq | EXTENDED_RELEASE_TABLET | ORAL | 3 refills | Status: DC

## 2023-09-09 NOTE — Telephone Encounter (Signed)
Called patient and notified her of the following from Dr. Mariah Milling.  Echo  Normal left and right ventricular size and function  Moderately elevated right heart pressures  Mild to moderate TR   To treat elevated right heart pressures,  would recommend Lasix 20 mg 3 days a week with potassium 20 mill equivalents 3 days a week for elevated pressures  BMP in 3 weeks time   Patient verbalizes understanding. Prescription sent to preferred pharmacy. Orders placed for labs.

## 2023-09-13 ENCOUNTER — Other Ambulatory Visit: Payer: Self-pay | Admitting: Cardiovascular Disease

## 2023-09-18 ENCOUNTER — Encounter: Payer: Self-pay | Admitting: Family Medicine

## 2023-09-18 ENCOUNTER — Ambulatory Visit: Payer: Medicare Other | Admitting: Family Medicine

## 2023-09-18 VITALS — BP 125/78 | HR 76 | Temp 97.9°F | Ht 67.0 in | Wt 132.0 lb

## 2023-09-18 DIAGNOSIS — I8392 Asymptomatic varicose veins of left lower extremity: Secondary | ICD-10-CM | POA: Diagnosis not present

## 2023-09-18 DIAGNOSIS — I8390 Asymptomatic varicose veins of unspecified lower extremity: Secondary | ICD-10-CM | POA: Insufficient documentation

## 2023-09-18 DIAGNOSIS — I4891 Unspecified atrial fibrillation: Secondary | ICD-10-CM

## 2023-09-18 DIAGNOSIS — I10 Essential (primary) hypertension: Secondary | ICD-10-CM

## 2023-09-18 NOTE — Assessment & Plan Note (Addendum)
Chronic issue.  Adequately controlled at home.  Patient will continue Cardura 2 mg twice daily and propranolol 60 mg daily.  She will also continue on Lasix 20 mg 3 times a week.  Discussed balancing fluid intake with the fact that she is on Lasix.

## 2023-09-18 NOTE — Assessment & Plan Note (Signed)
Chronic issue.  Sinus rhythm today.  Patient will continue propranolol 60 mg daily and Eliquis 5 mg twice daily.  She will continue to follow with cardiology.

## 2023-09-18 NOTE — Progress Notes (Signed)
Amy Alar, MD Phone: [phone]  Amy Mcconnell is a 72 y.o. female who presents today for follow-up.  Hypertension: Typically 120s over 70s.  Taking Cardura and propranolol.  Also takes Lasix 3 times a week.  Cardiology placed her on the Lasix due to elevated right heart pressures.  Atrial fibrillation: Recent diagnosis.  She is now on Eliquis 5 mg twice daily and propranolol 60 mg daily with as needed propranolol to take if her heart rates above 100.  She has had no bleeding issues.  She rarely has palpitations.  She has established with cardiology.  Varicose vein: Patient reports a varicose vein in her left calf.  She noticed this first over the summer after kicking a bag of sand.  She notes an occasional discomfort in the area of the vein when she lays down at night.  Social History   Tobacco Use  Smoking Status Former   Current packs/day: 0.00   Average packs/day: 0.5 packs/day for 13.0 years (6.5 ttl pk-yrs)   Types: Cigarettes   Start date: 01/23/1987   Quit date: 01/23/2000   Years since quitting: 23.6  Smokeless Tobacco Never    Current Outpatient Medications on File Prior to Visit  Medication Sig Dispense Refill   apixaban (ELIQUIS) 5 MG TABS tablet Take 1 tablet (5 mg total) by mouth 2 (two) times daily. 180 tablet 3   Ascorbic Acid (VITAMIN C) 1000 MG tablet Take 1,000 mg by mouth daily.     atorvastatin (LIPITOR) 20 MG tablet Take 1 tablet by mouth once daily 90 tablet 3   B Complex Vitamins (VITAMIN B COMPLEX PO) Take 1 tablet by mouth daily.     Bacillus Coagulans-Inulin (ALIGN PREBIOTIC-PROBIOTIC PO) Take by mouth.     calcium carbonate (OS-CAL) 600 MG TABS tablet Take 600 mg by mouth daily.      Cholecalciferol (VITAMIN D3) 50 MCG (2000 UT) capsule Take 2,000 Units by mouth daily.     diphenhydrAMINE (BENADRYL) 25 mg capsule Take 25 mg by mouth daily as needed for allergies.     docusate sodium (COLACE) 100 MG capsule Take 100 mg by mouth 2 (two) times  daily.     doxazosin (CARDURA) 2 MG tablet TAKE 1 TABLET BY MOUTH TWICE DAILY (MAY  TAKE  EXTRA  TABLET  AS  NEEDED  FOR  BP  GREATER  THAN  160) 180 tablet 3   EPINEPHrine (EPIPEN 2-PAK) 0.3 mg/0.3 mL IJ SOAJ injection Inject 0.3 mg into the muscle as needed for anaphylaxis. 2 each 0   ferrous sulfate 325 (65 FE) MG tablet Take by mouth once.     fexofenadine (ALLEGRA) 180 MG tablet Take 180 mg by mouth daily as needed for allergies.     Flaxseed, Linseed, (FLAXSEED OIL PO) Take 1 capsule by mouth daily.     furosemide (LASIX) 20 MG tablet Take 1 tablet (20 mg total) by mouth 3 (three) times a week. 12 tablet 3   lithium carbonate (LITHOBID) 300 MG ER tablet Take 2 tablets (600 mg total) by mouth at bedtime. 60 tablet 0   magnesium oxide (MAG-OX) 400 MG tablet Take 400 mg by mouth daily.     methimazole (TAPAZOLE) 10 MG tablet Take 1 tablet by mouth daily.     Multiple Vitamins-Minerals (MULTIVITAMIN WITH MINERALS) tablet Take 1 tablet by mouth daily.     Multiple Vitamins-Minerals (VISION FORMULA/LUTEIN) TABS Take 1 tablet by mouth in the morning and at bedtime.     Omega-3  Fatty Acids (FISH OIL) 1000 MG CAPS Take 3,000 mg by mouth daily.     Oxcarbazepine (TRILEPTAL) 300 MG tablet TAKE 1 TABLET BY MOUTH IN THE MORNING AND 2 AT BEDTIME 90 tablet 3   potassium chloride SA (KLOR-CON M20) 20 MEQ tablet Take 1 tablet (20 mEq total) by mouth 3 (three) times a week. 12 tablet 3   propranolol (INDERAL) 20 MG tablet Take 1 tablet (20 mg total) by mouth 3 (three) times daily as needed. For heart rate greater then 100 270 tablet 3   propranolol ER (INDERAL LA) 60 MG 24 hr capsule Take 1 capsule (60 mg total) by mouth daily. 90 capsule 3   vitamin B-12 (CYANOCOBALAMIN) 1000 MCG tablet Take 1,000 mcg by mouth daily.     vitamin E 1000 UNIT capsule Take 1,000 Units by mouth daily.     zinc gluconate 50 MG tablet Take 50 mg by mouth daily.     ziprasidone (GEODON) 20 MG capsule Take 1 capsule (20 mg total)  by mouth daily with supper. 30 capsule 5   No current facility-administered medications on file prior to visit.     ROS see history of present illness  Objective  Physical Exam Vitals:   09/18/23 1041 09/18/23 1044  BP: 134/82 125/78  Pulse:    Temp:    SpO2:      BP Readings from Last 3 Encounters:  09/18/23 125/78  08/20/23 121/62  08/06/23 120/60   Wt Readings from Last 3 Encounters:  09/18/23 132 lb (59.9 kg)  08/20/23 127 lb 9.6 oz (57.9 kg)  08/06/23 127 lb 2 oz (57.7 kg)    Physical Exam Constitutional:      General: She is not in acute distress.    Appearance: She is not diaphoretic.  Cardiovascular:     Rate and Rhythm: Normal rate and regular rhythm.     Heart sounds: Normal heart sounds.  Pulmonary:     Effort: Pulmonary effort is normal.     Breath sounds: Normal breath sounds.  Musculoskeletal:     Comments: Varicose vein in the posterior aspect of her left calf that is nontender  Skin:    General: Skin is warm and dry.  Neurological:     Mental Status: She is alert.      Assessment/Plan: Please see individual problem list.  Essential hypertension Assessment & Plan: Chronic issue.  Adequately controlled at home.  Patient will continue Cardura 2 mg twice daily and propranolol 60 mg daily.  She will also continue on Lasix 20 mg 3 times a week.  Discussed balancing fluid intake with the fact that she is on Lasix.   Atrial fibrillation, unspecified type [facility]) Assessment & Plan: Chronic issue.  Sinus rhythm today.  Patient will continue propranolol 60 mg daily and Eliquis 5 mg twice daily.  She will continue to follow with cardiology.   Asymptomatic varicose veins of left lower extremity Assessment & Plan: Chronic issue.  Encouraged use of compression stockings.  If she has any worsening or acute symptoms she will let us know.     Return in about 6 months (around 03/17/2024) for transfer of care.   Amy Alar, MD [facility]

## 2023-09-18 NOTE — Assessment & Plan Note (Signed)
Chronic issue.  Encouraged use of compression stockings.  If she has any worsening or acute symptoms she will let us know.

## 2023-09-24 ENCOUNTER — Telehealth: Payer: Self-pay | Admitting: Family Medicine

## 2023-09-24 ENCOUNTER — Telehealth: Payer: Self-pay | Admitting: Cardiovascular Disease

## 2023-09-24 NOTE — Telephone Encounter (Signed)
Patient is not getting labs with [place]. Patient was supposed to have labs with Cardiology today but she cancelled and rescheduled her cardiology labs for 12/24.

## 2023-09-24 NOTE — Telephone Encounter (Signed)
Patient need lab orders.

## 2023-09-24 NOTE — Telephone Encounter (Signed)
I called and spoke with the patient. She advised she missed at least 3 doses of her lasix last week and would like to postpone her lab work for this reason. Per the patient, she was going to come on Friday 09/27/23 for lab work. I advised the patient, she can come Mon-Wed next week. Amy Mcconnell advised, she will be going out of town from Saturday 11/23-Saturday 11/30.  She will come on Monday 10/07/23 for lab work (BMP).  The patient voices understanding and is agreeable. She was very appreciative of the call back.

## 2023-09-24 NOTE — Telephone Encounter (Signed)
Pt c/o medication issue:  1. Name of Medication: Lasix  2. How are you currently taking this medication (dosage and times per day)?   3. Are you having a reaction (difficulty breathing--STAT)?   4. What is your medication issue? Patient said she forgot to take her Lasix several times. She is scheduled for lab work on Friday. She wants to know if she should reschedule the lab work, she feels her results will not be accurate, since she missed taking her medicine

## 2023-09-24 NOTE — Telephone Encounter (Signed)
Please see phone message from cardiology.  It looks like she is going to have her potassium checked with them in December.  I do not see where I have advised her to come in this week for her potassium to be checked.

## 2023-09-25 DIAGNOSIS — R32 Unspecified urinary incontinence: Secondary | ICD-10-CM | POA: Diagnosis not present

## 2023-09-25 DIAGNOSIS — D6869 Other thrombophilia: Secondary | ICD-10-CM | POA: Diagnosis not present

## 2023-09-27 ENCOUNTER — Other Ambulatory Visit: Payer: Medicare Other

## 2023-09-27 DIAGNOSIS — Z79899 Other long term (current) drug therapy: Secondary | ICD-10-CM | POA: Diagnosis not present

## 2023-09-28 LAB — BASIC METABOLIC PANEL
BUN/Creatinine Ratio: 37 — ABNORMAL HIGH (ref 12–28)
BUN: 34 mg/dL — ABNORMAL HIGH (ref 8–27)
CO2: 23 mmol/L (ref 20–29)
Calcium: 10.5 mg/dL — ABNORMAL HIGH (ref 8.7–10.3)
Chloride: 102 mmol/L (ref 96–106)
Creatinine, Ser: 0.92 mg/dL (ref 0.57–1.00)
Glucose: 88 mg/dL (ref 70–99)
Potassium: 4.8 mmol/L (ref 3.5–5.2)
Sodium: 141 mmol/L (ref 134–144)
eGFR: 66 mL/min/{1.73_m2} (ref >59)

## 2023-10-07 ENCOUNTER — Other Ambulatory Visit: Payer: Self-pay | Admitting: *Deleted

## 2023-10-07 DIAGNOSIS — E0590 Thyrotoxicosis, unspecified without thyrotoxic crisis or storm: Secondary | ICD-10-CM | POA: Diagnosis not present

## 2023-10-07 DIAGNOSIS — Z79899 Other long term (current) drug therapy: Secondary | ICD-10-CM | POA: Diagnosis not present

## 2023-10-07 DIAGNOSIS — E0500 Thyrotoxicosis with diffuse goiter without thyrotoxic crisis or storm: Secondary | ICD-10-CM | POA: Diagnosis not present

## 2023-10-07 DIAGNOSIS — I10 Essential (primary) hypertension: Secondary | ICD-10-CM

## 2023-10-08 LAB — BASIC METABOLIC PANEL
BUN/Creatinine Ratio: 37 — ABNORMAL HIGH (ref 12–28)
BUN: 22 mg/dL (ref 8–27)
CO2: 21 mmol/L (ref 20–29)
Calcium: 9.7 mg/dL (ref 8.7–10.3)
Chloride: 106 mmol/L (ref 96–106)
Creatinine, Ser: 0.6 mg/dL (ref 0.57–1.00)
Glucose: 86 mg/dL (ref 70–99)
Potassium: 4.8 mmol/L (ref 3.5–5.2)
Sodium: 140 mmol/L (ref 134–144)
eGFR: 95 mL/min/{1.73_m2} (ref >59)

## 2023-10-09 DIAGNOSIS — E042 Nontoxic multinodular goiter: Secondary | ICD-10-CM | POA: Diagnosis not present

## 2023-10-09 DIAGNOSIS — E0500 Thyrotoxicosis with diffuse goiter without thyrotoxic crisis or storm: Secondary | ICD-10-CM | POA: Insufficient documentation

## 2023-10-09 DIAGNOSIS — D2261 Melanocytic nevi of right upper limb, including shoulder: Secondary | ICD-10-CM | POA: Diagnosis not present

## 2023-10-09 DIAGNOSIS — L570 Actinic keratosis: Secondary | ICD-10-CM | POA: Diagnosis not present

## 2023-10-09 DIAGNOSIS — D225 Melanocytic nevi of trunk: Secondary | ICD-10-CM | POA: Diagnosis not present

## 2023-10-09 DIAGNOSIS — D2262 Melanocytic nevi of left upper limb, including shoulder: Secondary | ICD-10-CM | POA: Diagnosis not present

## 2023-10-09 DIAGNOSIS — L821 Other seborrheic keratosis: Secondary | ICD-10-CM | POA: Diagnosis not present

## 2023-10-09 DIAGNOSIS — E0590 Thyrotoxicosis, unspecified without thyrotoxic crisis or storm: Secondary | ICD-10-CM | POA: Diagnosis not present

## 2023-10-15 ENCOUNTER — Encounter: Payer: Self-pay | Admitting: Emergency Medicine

## 2023-11-04 ENCOUNTER — Emergency Department
Admission: EM | Admit: 2023-11-04 | Discharge: 2023-11-04 | Disposition: A | Discharge status: Home / Self Care | Payer: Medicare Other | Attending: Emergency Medicine | Admitting: Emergency Medicine

## 2023-11-04 ENCOUNTER — Other Ambulatory Visit: Payer: Self-pay

## 2023-11-04 ENCOUNTER — Emergency Department: Payer: Medicare Other

## 2023-11-04 DIAGNOSIS — R079 Chest pain, unspecified: Secondary | ICD-10-CM

## 2023-11-04 DIAGNOSIS — Z7901 Long term (current) use of anticoagulants: Secondary | ICD-10-CM | POA: Diagnosis not present

## 2023-11-04 DIAGNOSIS — R0789 Other chest pain: Secondary | ICD-10-CM | POA: Diagnosis not present

## 2023-11-04 DIAGNOSIS — R0600 Dyspnea, unspecified: Secondary | ICD-10-CM | POA: Diagnosis not present

## 2023-11-04 DIAGNOSIS — I10 Essential (primary) hypertension: Secondary | ICD-10-CM | POA: Diagnosis not present

## 2023-11-04 DIAGNOSIS — I700 Atherosclerosis of aorta: Secondary | ICD-10-CM | POA: Diagnosis not present

## 2023-11-04 DIAGNOSIS — J439 Emphysema, unspecified: Secondary | ICD-10-CM | POA: Diagnosis not present

## 2023-11-04 LAB — CBC
HCT: 44.7 % (ref 36.0–46.0)
Hemoglobin: 14.1 g/dL (ref 12.0–15.0)
MCH: 26.9 pg (ref 26.0–34.0)
MCHC: 31.5 g/dL (ref 30.0–36.0)
MCV: 85.3 fL (ref 80.0–100.0)
Platelets: 161 10*3/uL (ref 150–400)
RBC: 5.24 MIL/uL — ABNORMAL HIGH (ref 3.87–5.11)
RDW: 14.3 % (ref 11.5–15.5)
WBC: 6.3 10*3/uL (ref 4.0–10.5)
nRBC: 0 % (ref 0.0–0.2)

## 2023-11-04 LAB — COMPREHENSIVE METABOLIC PANEL
ALT: 25 U/L (ref 0–44)
AST: 25 U/L (ref 15–41)
Albumin: 4.3 g/dL (ref 3.5–5.0)
Alkaline Phosphatase: 251 U/L — ABNORMAL HIGH (ref 38–126)
Anion gap: 10 (ref 5–15)
BUN: 30 mg/dL — ABNORMAL HIGH (ref 8–23)
CO2: 24 mmol/L (ref 22–32)
Calcium: 9.7 mg/dL (ref 8.9–10.3)
Chloride: 104 mmol/L (ref 98–111)
Creatinine, Ser: 0.71 mg/dL (ref 0.44–1.00)
GFR, Estimated: >60 mL/min (ref >60)
Glucose, Bld: 110 mg/dL — ABNORMAL HIGH (ref 70–99)
Potassium: 4 mmol/L (ref 3.5–5.1)
Sodium: 138 mmol/L (ref 135–145)
Total Bilirubin: 0.7 mg/dL (ref <1.2)
Total Protein: 7.8 g/dL (ref 6.5–8.1)

## 2023-11-04 LAB — TROPONIN I (HIGH SENSITIVITY)
Troponin I (High Sensitivity): 8 ng/L (ref <18)
Troponin I (High Sensitivity): 7 ng/L (ref <18)

## 2023-11-04 NOTE — ED Provider Notes (Signed)
[facility] Provider Note    Event Date/Time   First MD Initiated Contact with Patient 11/04/23 1056     (approximate)   History   Chief Complaint Chest Pain   HPI  Amy Mcconnell is a 72 y.o. female with past medical history of hypertension, hyperlipidemia, atrial fibrillation on Eliquis, and bipolar disorder who presents to the ED complaining of chest pain.  Patient reports that she was woken from sleep around 2:00 this morning with sharp pain in the left side of her chest.  Pain lasted for about 5 minutes before resolving and she denies any associated difficulty breathing.  She now feels back to normal, denies any recent fevers, cough, pain or swelling in her legs.  While she has a history of atrial fibrillation, she denies any history of ACS or CAD.      Physical Exam   Triage Vital Signs: ED Triage Vitals  Encounter Vitals Group     BP 11/04/23 0341 (!) 184/102     Systolic BP Percentile --      Diastolic BP Percentile --      Pulse Rate 11/04/23 0341 (!) 51     Resp 11/04/23 0341 18     Temp 11/04/23 0341 98.4 F (36.9 C)     Temp Source 11/04/23 0341 Oral     SpO2 11/04/23 0341 96 %     Weight 11/04/23 0340 130 lb (59 kg)     Height 11/04/23 0340 5\' 7"  (1.702 m)     Head Circumference --      Peak Flow --      Pain Score 11/04/23 0340 0     Pain Loc --      Pain Education --      Exclude from Growth Chart --     Most recent vital signs: Vitals:   11/04/23 0956 11/04/23 0956  BP:  (!) 130/90  Pulse:  76  Resp:  18  Temp: 98 F (36.7 C)   SpO2:  93%    Constitutional: Alert and oriented. Eyes: Conjunctivae are normal. Head: Atraumatic. Nose: No congestion/rhinnorhea. Mouth/Throat: Mucous membranes are moist.  Cardiovascular: Normal rate, regular rhythm. Grossly normal heart sounds.  2+ radial pulses bilaterally. Respiratory: Normal respiratory effort.  No retractions. Lungs CTAB.  No chest wall tenderness to  palpation. Gastrointestinal: Soft and nontender. No distention. Musculoskeletal: No lower extremity tenderness nor edema.  Neurologic:  Normal speech and language. No gross focal neurologic deficits are appreciated.    ED Results / Procedures / Treatments   Labs (all labs ordered are listed, but only abnormal results are displayed) Labs Reviewed  CBC - Abnormal; Notable for the following components:      Result Value   RBC 5.24 (*)    All other components within normal limits  COMPREHENSIVE METABOLIC PANEL - Abnormal; Notable for the following components:   Glucose, Bld 110 (*)    BUN 30 (*)    Alkaline Phosphatase 251 (*)    All other components within normal limits  TROPONIN I (HIGH SENSITIVITY)  TROPONIN I (HIGH SENSITIVITY)     EKG  ED ECG REPORT I, Chesley Noon, the attending physician, personally viewed and interpreted this ECG.   Date: 11/04/2023  EKG Time: 3:46  Rate: 78  Rhythm: normal sinus rhythm  Axis: Normal  Intervals:none  ST&T Change: None  RADIOLOGY Chest x-ray reviewed and interpreted by me with no infiltrate, edema, or effusion.  PROCEDURES:  Critical Care performed:  No  Procedures   MEDICATIONS ORDERED IN ED: Medications - No data to display   IMPRESSION / MDM / ASSESSMENT AND PLAN / ED COURSE  I reviewed the triage vital signs and the nursing notes.                              72 y.o. female with past medical history of hypertension, hyperlipidemia, atrial fibrillation on Eliquis, and bipolar disorder who presents to the ED with sharp pain in the left side of her chest that woke her from sleep and resolved after about 5 minutes.  Patient's presentation is most consistent with acute presentation with potential threat to life or bodily function.  Differential diagnosis includes, but is not limited to, ACS, PE, pneumonia, pneumothorax, musculoskeletal pain, GERD, anxiety.  Patient nontoxic-appearing and in no acute distress, vital  signs are unremarkable.  EKG shows no evidence of arrhythmia or ischemia and 2 troponins are within normal limits, doubt ACS given atypical symptoms.  I also doubt PE or dissection given resolution of her symptoms.  Chest x-ray is unremarkable and remainder of labs are reassuring with no significant anemia, leukocytosis, electrolyte abnormality, or AKI.  Patient appropriate for discharge home with outpatient cardiology follow-up, was counseled to return to the ED for new or worsening symptoms.  Patient agrees with plan.      FINAL CLINICAL IMPRESSION(S) / ED DIAGNOSES   Final diagnoses:  Nonspecific chest pain     Rx / DC Orders   ED Discharge Orders     None        Note:  This document was prepared using Dragon voice recognition software and may include unintentional dictation errors.   Chesley Noon, MD 11/04/23 [phone]

## 2023-11-04 NOTE — ED Triage Notes (Signed)
Pt in with co chest pain tonight and htn. STates has been taking bp meds are rx.

## 2023-11-18 ENCOUNTER — Ambulatory Visit: Payer: Medicare Other | Attending: Physician Assistant | Admitting: Physician Assistant

## 2023-11-18 ENCOUNTER — Encounter: Payer: Self-pay | Admitting: Physician Assistant

## 2023-11-18 VITALS — BP 124/82 | HR 69 | Ht 67.0 in | Wt 128.2 lb

## 2023-11-18 DIAGNOSIS — E782 Mixed hyperlipidemia: Secondary | ICD-10-CM | POA: Diagnosis not present

## 2023-11-18 DIAGNOSIS — R079 Chest pain, unspecified: Secondary | ICD-10-CM

## 2023-11-18 DIAGNOSIS — I480 Paroxysmal atrial fibrillation: Secondary | ICD-10-CM

## 2023-11-18 DIAGNOSIS — E0590 Thyrotoxicosis, unspecified without thyrotoxic crisis or storm: Secondary | ICD-10-CM

## 2023-11-18 DIAGNOSIS — I10 Essential (primary) hypertension: Secondary | ICD-10-CM

## 2023-11-18 NOTE — Patient Instructions (Signed)
 Medication Instructions:  Your Physician recommend you continue on your current medication as directed.    *If you need a refill on your cardiac medications before your next appointment, please call your pharmacy*   Lab Work: None ordered at this time  If you have labs (blood work) drawn today and your tests are completely normal, you will receive your results only by: MyChart Message (if you have MyChart) OR A paper copy in the mail If you have any lab test that is abnormal or we need to change your treatment, we will call you to review the results.   Testing/Procedures:   Please report to Radiology at [facility] Main Entrance, medical mall, 30 mins prior to your test.  [address]  [place], [place]  [phone]  How to Prepare for Your Cardiac PET/CT Stress Test:  Nothing to eat or drink, except water, 3 hours prior to arrival time.  NO caffeine/decaffeinated products, or chocolate 12 hours prior to arrival. (Please note decaffeinated beverages (teas/coffees) still contain caffeine).  If you have caffeine within 12 hours prior, the test will need to be rescheduled.  Medication instructions: Do not take erectile dysfunction medications for 72 hours prior to test (sildenafil, tadalafil) Do not take nitrates (isosorbide mononitrate, Ranexa) the day before or day of test Do not take tamsulosin the day before or morning of test Hold theophylline containing medications for 12 hours. Hold Dipyridamole 48 hours prior to the test.  Diabetic Preparation: If able to eat breakfast prior to 3 hour fasting, you may take all medications, including your insulin. Do not worry if you miss your breakfast dose of insulin - start at your next meal. If you do not eat prior to 3 hour fast-Hold all diabetes (oral and insulin) medications. Patients who wear a continuous glucose monitor MUST remove the device prior to scanning.  You may take your remaining medications  with water.  NO perfume, cologne or lotion on chest or abdomen area. FEMALES - Please avoid wearing dresses to this appointment.  Total time is 1 to 2 hours; you may want to bring reading material for the waiting time.   In preparation for your appointment, medication and supplies will be purchased.  Appointment availability is limited, so if you need to cancel or reschedule, please call the Radiology Department at [phone] [facility]) OR [phone] [facility]) 24 hours in advance to avoid a cancellation fee of $100.00  What to Expect When you Arrive:  Once you arrive and check in for your appointment, you will be taken to a preparation room within the Radiology Department.  A technologist or Nurse will obtain your medical history, verify that you are correctly prepped for the exam, and explain the procedure.  Afterwards, an IV will be started in your arm and electrodes will be placed on your skin for EKG monitoring during the stress portion of the exam. Then you will be escorted to the PET/CT scanner.  There, staff will get you positioned on the scanner and obtain a blood pressure and EKG.  During the exam, you will continue to be connected to the EKG and blood pressure machines.  A small, safe amount of a radioactive tracer will be injected in your IV to obtain a series of pictures of your heart along with an injection of a stress agent.    After your Exam:  It is recommended that you eat a meal and drink a caffeinated beverage to counter act any effects of the  stress agent.  Drink plenty of fluids for the remainder of the day and urinate frequently for the first couple of hours after the exam.  Your doctor will inform you of your test results within 7-10 business days.  For more information and frequently asked questions, please visit our website: [email or web address]  For questions about your test or how to prepare for your test, please call: Cardiac Imaging Nurse  [facility] Office: [phone] PET    Follow-Up: At [facility], you and your health needs are our priority.  As part of our continuing mission to provide you with exceptional heart care, we have created designated Provider Care Teams.  These Care Teams include your primary Cardiologist (physician) and Advanced Practice Providers (APPs -  Physician Assistants and Nurse Practitioners) who all work together to provide you with the care you need, when you need it.  We recommend signing up for the patient portal called MyChart.  Sign up information is provided on this After Visit Summary.  MyChart is used to connect with patients for Virtual Visits (Telemedicine).  Patients are able to view lab/test results, encounter notes, upcoming appointments, etc.  Non-urgent messages can be sent to your provider as well.   To learn more about what you can do with MyChart, go to [email or web address].    Your next appointment:   3 month(s)  Provider:   You may see Timothy Gollan, MD or one of the following Advanced Practice Providers on your designated Care Team:   Bernardino Bring, PA-C

## 2023-11-18 NOTE — Progress Notes (Signed)
 Cardiology Office Note    Date:  11/18/2023   ID:  Nona, Gracey [birth year], MRN [id number]  PCP:  Maribeth Camellia MATSU, MD  Cardiologist:  Evalene Lunger, MD  Electrophysiologist:  None   Chief Complaint: ED follow up  History of Present Illness:   Amy Mcconnell is a 73 y.o. female with history of PAF, hyperthyroidism, HTN, oral allergy  syndrome, multiple medication intolerances, and prior tobacco use smoking 1/2 pack/day for 15 years who presents for ED follow-up.  She was seen by Dr. Gollan in 08/2023 for ED follow-up of recently diagnosed A-fib with RVR in the ED in 07/2023.  She had recently been diagnosed with hyperthyroidism.  She had recently been started on methimazole.  She was started on propranolol  and apixaban  with recommendation to increase OAC to therapeutic dose.  Echo in 08/2023 showed an EF of 55 to 60%, no regional wall motion abnormalities, mild RV systolic function and ventricular cavity size, moderately elevated RVSP estimated at 53.3 mmHg, mild to moderate tricuspid regurgitation, and mild aortic insufficiency.  She was seen in the ED on 11/04/2023 with complaints of chest pain that woke her up from sleep around 2:00 that morning and lasted for 2-3 minutes spontaneous resolution.  No associated symptoms.  Blood pressure was elevated at 184/102, improved to 130/90 prior to discharge.  EKG showed sinus rhythm with nonspecific ST-T changes.  High-sensitivity troponin negative x 2.  Chest x-ray showed no acute cardiopulmonary process with emphysema and aortic atherosclerosis.  She comes in doing well from a cardiac perspective at this time.  She has been without further episodes of chest pain.  No symptoms of cardiac decompensation.  Leading up to the events on 11/04/2023, she had been without symptoms of increased fatigue or angina.  Typically very active at baseline.  No falls or symptoms concerning for bleeding.  No dizziness, presyncope, or syncope.  Adherent  and tolerating propranolol  and apixaban .  Since she was last seen in our office that she has taken as needed propranolol  a couple of times.  During her episode of chest discomfort last month she was without symptoms similar to prior A-fib, such as sustained tachypalpitations.  When she was documented to be in A-fib in the fall 2024, she was without associated chest pain at that time.  Currently feels well.   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 Bernardino - every 3 months   Broken foot    left    Contact dermatitis and other eczema due to other specified agent    Contusion of unspecified part of lower limb    Essential hypertension, benign    Heart murmur    New onset a-fib (HCC) 08/05/2023   Other and unspecified hyperlipidemia    Other specified disorders of thyroid     Reflux esophagitis    Scalp laceration 04/13/2019   Tachycardia    a. isolated episode, seen in ED with negative work up    Past Surgical History:  Procedure Laterality Date   CATARACT EXTRACTION     COLONOSCOPY WITH PROPOFOL  N/A 03/27/2022   Procedure: COLONOSCOPY WITH PROPOFOL ;  Surgeon: Jinny Carmine, MD;  Location:  [facility] ENDOSCOPY;  Service: Endoscopy;  Laterality: N/A;   hamertoe     HAMMER TOE SURGERY  11/30/2021   right 4th toe 11/10/21 Dr. Silva   SALPINGECTOMY Left    STERILIZATION     Her decision since dz of bipolar    Current Medications: Current Meds  Medication Sig   apixaban  (ELIQUIS ) 5 MG TABS tablet Take 1 tablet (5 mg total) by mouth 2 (two) times daily.   atorvastatin  (LIPITOR) 20 MG tablet Take 1 tablet by mouth once daily   B Complex Vitamins (VITAMIN B COMPLEX PO) Take 1 tablet by mouth daily.    Bacillus Coagulans-Inulin (ALIGN PREBIOTIC-PROBIOTIC PO) Take by mouth.   calcium  carbonate (OS-CAL) 600 MG TABS tablet Take 600 mg by mouth daily.    Cholecalciferol (VITAMIN D3) 50 MCG (2000 UT) capsule Take 2,000 Units by mouth daily.   diphenhydrAMINE  (BENADRYL ) 25 mg capsule Take 25 mg by mouth daily as needed for allergies.   docusate sodium  (COLACE) 100 MG capsule Take 100 mg by mouth 2 (two) times daily.   doxazosin  (CARDURA ) 2 MG tablet TAKE 1 TABLET BY MOUTH TWICE DAILY (MAY  TAKE  EXTRA  TABLET  AS  NEEDED  FOR  BP  GREATER  THAN  160)   EPINEPHrine  (EPIPEN  2-PAK) 0.3 mg/0.3 mL IJ SOAJ injection Inject 0.3 mg into the muscle as needed for anaphylaxis.   fexofenadine (ALLEGRA) 180 MG tablet Take 180 mg by mouth daily as needed for allergies.   Flaxseed, Linseed, (FLAXSEED OIL PO) Take 1 capsule by mouth daily.   furosemide  (LASIX ) 20 MG tablet Take 1 tablet (20 mg total) by mouth 3 (three) times a week.   lithium  carbonate (LITHOBID ) 300 MG ER tablet Take 2 tablets (600 mg total) by mouth at bedtime.   magnesium oxide (MAG-OX) 400 MG tablet Take 400 mg by mouth daily.   methimazole (TAPAZOLE) 10 MG tablet Take 1 tablet by mouth daily.   Multiple Vitamins-Minerals (VISION FORMULA/LUTEIN) TABS Take 1 tablet by mouth in the morning and at bedtime.   Omega-3 Fatty Acids (FISH OIL) 1000 MG CAPS Take 3,000 mg by mouth daily.   Oxcarbazepine  (TRILEPTAL ) 300 MG tablet TAKE 1 TABLET BY MOUTH IN THE MORNING AND 2 AT BEDTIME   potassium chloride  SA (KLOR-CON  M20) 20 MEQ tablet Take 1 tablet (20 mEq total) by mouth 3 (three) times a week.   propranolol  (INDERAL ) 20 MG tablet Take 1 tablet (20 mg total) by mouth 3 (three) times daily as needed. For heart rate greater then 100   propranolol  ER (INDERAL  LA) 60 MG 24 hr capsule Take 1 capsule (60 mg total) by mouth daily.   vitamin B-12 (CYANOCOBALAMIN ) 1000 MCG tablet Take 1,000 mcg by mouth daily.   vitamin E  1000 UNIT capsule Take 1,000 Units by  mouth daily.   zinc  gluconate 50 MG tablet Take 50 mg by mouth daily.   ziprasidone  (GEODON ) 20 MG capsule Take 1 capsule (20 mg total) by mouth daily with supper.   [DISCONTINUED] Ascorbic Acid  (VITAMIN C) 1000 MG tablet Take 1,000 mg by mouth daily.    Allergies:   Apple juice, Ace inhibitors, Angiotensin receptor blockers, Benicar [olmesartan], Beta adrenergic blockers, Poison ivy extract [poison ivy extract], Purell instant hand [alcohol], and Triclosan   Social History   Socioeconomic History   Marital status: Divorced    Spouse name: Not on file   Number of children: 0   Years of education: 14   Highest education level:  Not on file  Occupational History   Not on file  Tobacco Use   Smoking status: Former    Current packs/day: 0.00    Average packs/day: 0.5 packs/day for 13.0 years (6.5 ttl pk-yrs)    Types: Cigarettes    Start date: 01/23/1987    Quit date: 01/23/2000    Years since quitting: 23.8   Smokeless tobacco: Never  Vaping Use   Vaping status: Never Used  Substance and Sexual Activity   Alcohol use: No   Drug use: No   Sexual activity: Not Currently  Other Topics Concern   Not on file  Social History Narrative   Amy Mcconnell grew up in [place], [place]. She lives in [place]. Amy Mcconnell is divorced. She has 2 cats (Charlie and Angie). 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 She also volunteers 2 days at [facility]. She enjoys reading, garding and cooking.   Social Drivers of Corporate Investment Banker Strain: Low Risk  (11/16/2022)   Overall Financial Resource Strain (CARDIA)    Difficulty of Paying Living Expenses: Not hard at all  Food Insecurity: No Food Insecurity (11/16/2022)   Hunger Vital Sign    Worried About Running Out of Food in the Last Year: Never true    Ran Out of Food in the Last Year: Never true  Transportation Needs: No Transportation Needs (11/16/2022)   PRAPARE - Administrator, Civil Service (Medical): No    Lack of  Transportation (Non-Medical): No  Physical Activity: Sufficiently Active (11/16/2022)   Exercise Vital Sign    Days of Exercise per Week: 5 days    Minutes of Exercise per Session: 30 min  Stress: No Stress Concern Present (11/16/2022)   [employer] of Occupational Health - Occupational Stress Questionnaire    Feeling of Stress : Not at all  Social Connections: Moderately Isolated (11/16/2022)   Social Connection and Isolation Panel [NHANES]    Frequency of Communication with Friends and Family: More than three times a week    Frequency of Social Gatherings with Friends and Family: Once a week    Attends Religious Services: Never    Database Administrator or Organizations: Yes    Attends Banker Meetings: Never    Marital Status: Divorced     Family History:  The patient's family history includes Alcohol abuse in her brother; Alzheimer's disease in her mother; Arrhythmia in her mother; Asthma in her brother; Bipolar disorder in her paternal grandmother, paternal uncle, paternal uncle, and paternal uncle; Breast cancer in her maternal aunt; COPD in her brother; Depression in her brother; Emphysema in her brother; Heart failure in her mother; Hyperlipidemia in her brother, brother, brother, brother, father, and mother; Hypertension in her brother, brother, brother, brother, father, and mother; Mental retardation (age of onset: 45) in her father.  ROS:   12-point review of systems is negative unless otherwise noted in the HPI.   EKGs/Labs/Other Studies Reviewed:    Studies reviewed were summarized above. The additional studies were reviewed today:  2D echo 09/05/2023: 1. Left ventricular ejection fraction, by estimation, is 55 to 60%. The  left ventricle has normal function. The left ventricle has no regional  wall motion abnormalities. There is mild left ventricular hypertrophy.  Left ventricular diastolic parameters  are consistent with Grade I diastolic dysfunction  (impaired relaxation).   2. Right ventricular systolic function is normal. The right ventricular  size is normal. There is moderately elevated pulmonary artery  systolic  pressure. The estimated right ventricular systolic pressure is 53.3 mmHg.   3. The mitral valve is normal in structure. No evidence of mitral valve  regurgitation. No evidence of mitral stenosis.   4. Tricuspid valve regurgitation is mild to moderate.   5. The aortic valve is tricuspid. Aortic valve regurgitation is mild. No  aortic stenosis is present.   6. The inferior vena cava is dilated in size with >50% respiratory  variability, suggesting right atrial pressure of 8 mmHg.    EKG:  EKG is ordered today.  The EKG ordered today demonstrates NSR, 69 bpm, no acute ST-T changes  Recent Labs: 08/13/2023: TSH <0.010 11/04/2023: ALT 25; BUN 30; Creatinine, Ser 0.71; Hemoglobin 14.1; Platelets 161; Potassium 4.0; Sodium 138  Recent Lipid Panel    Component Value Date/Time   CHOL 179 07/21/2022 1328   TRIG 63 07/21/2022 1328   HDL 75 07/21/2022 1328   CHOLHDL 2.4 07/21/2022 1328   VLDL 13 07/21/2022 1328   LDLCALC 91 07/21/2022 1328   LDLDIRECT 94 11/24/2021 1426    PHYSICAL EXAM:    VS:  BP 124/82 (BP Location: Left Arm, Patient Position: Sitting)   Pulse 69   Ht 5' 7 (1.702 m)   Wt 128 lb 3.2 oz (58.2 kg)   SpO2 98%   BMI 20.08 kg/m   BMI: Body mass index is 20.08 kg/m.  Physical Exam Vitals reviewed.  Constitutional:      Appearance: She is well-developed.  HENT:     Head: Normocephalic and atraumatic.  Eyes:     General:        Right eye: No discharge.        Left eye: No discharge.  Neck:     Vascular: No JVD.  Cardiovascular:     Rate and Rhythm: Normal rate and regular rhythm.     Heart sounds: Normal heart sounds, S1 normal and S2 normal. Heart sounds not distant. No midsystolic click and no opening snap. No murmur heard.    No friction rub.  Pulmonary:     Effort: Pulmonary effort is  normal. No respiratory distress.     Breath sounds: Normal breath sounds. No decreased breath sounds, wheezing, rhonchi or rales.  Chest:     Chest wall: No tenderness.  Abdominal:     General: There is no distension.  Musculoskeletal:     Cervical back: Normal range of motion.     Right lower leg: No edema.     Left lower leg: No edema.  Skin:    General: Skin is warm and dry.     Nails: There is no clubbing.  Neurological:     Mental Status: She is alert and oriented to person, place, and time.  Psychiatric:        Speech: Speech normal.        Behavior: Behavior normal.        Thought Content: Thought content normal.        Judgment: Judgment normal.     Wt Readings from Last 3 Encounters:  11/18/23 128 lb 3.2 oz (58.2 kg)  11/04/23 130 lb (59 kg)  09/18/23 132 lb (59.9 kg)     ASSESSMENT & PLAN:   Chest pain with moderate risk for cardiac etiology: Currently chest pain-free and without symptoms of cardiac decompensation.  Recent evaluation in the ED for episode of chest pain that lasted 2 to 3 minutes with spontaneous resolution.  Workup reassuring including EKG and negative high-sensitivity  troponin x 2.  She has been chest pain-free since.  Schedule myocardial PET/CT to evaluate for high risk ischemia.  PAF: Maintaining sinus rhythm on propranolol  60 mg daily with an additional 20 mg 3 times daily as needed for sustained palpitations.  CHA2DS2-VASc at least 4 (HTN, age x 1, vascular disease, sex category).  Remains on apixaban  5 mg twice daily and does not meet reduced dosing criteria.  No falls or symptoms concerning for bleeding.  Recent labs stable.  HTN: Blood pressure is well-controlled in the office today.  Remains on propranolol  and Cardura .  HLD: LDL 91 in 07/2022.  Remains on atorvastatin  20 mg.  Hyperthyroidism: On propranolol  and methimazole.  Management by endocrinology.   Informed Consent   Shared Decision Making/Informed Consent{  The risks [chest pain,  shortness of breath, cardiac arrhythmias, dizziness, blood pressure fluctuations, myocardial infarction, stroke/transient ischemic attack, nausea, vomiting, allergic reaction, radiation exposure, metallic taste sensation and life-threatening complications (estimated to be 1 in 10,000)], benefits (risk stratification, diagnosing coronary artery disease, treatment guidance) and alternatives of a cardiac PET stress test were discussed in detail with Amy Mcconnell and she agrees to proceed.        Disposition: F/u with Dr. Gollan or an APP in 3 months.   Medication Adjustments/Labs and Tests Ordered: Current medicines are reviewed at length with the patient today.  Concerns regarding medicines are outlined above. Medication changes, Labs and Tests ordered today are summarized above and listed in the Patient Instructions accessible in Encounters.   Signed, Bernardino Bring, PA-C 11/18/2023 10:03 AM     [facility] [place] [address] [place], [place] [address] [phone]

## 2023-11-20 ENCOUNTER — Ambulatory Visit (INDEPENDENT_AMBULATORY_CARE_PROVIDER_SITE_OTHER): Payer: Medicare Other | Admitting: *Deleted

## 2023-11-20 VITALS — Ht 67.0 in | Wt 128.0 lb

## 2023-11-20 DIAGNOSIS — Z0000 Encounter for general adult medical examination without abnormal findings: Secondary | ICD-10-CM

## 2023-11-20 NOTE — Patient Instructions (Signed)
 Amy Mcconnell , Thank you for taking time to come for your Medicare Wellness Visit. I appreciate your ongoing commitment to your health goals. Please review the following plan we discussed and let me know if I can assist you in the future.   Referrals/Orders/Follow-Ups/Clinician Recommendations: None  This is a list of the screening recommended for you and due dates:  Health Maintenance  Topic Date Due   COVID-19 Vaccine (7 - 2024-25 season) 09/11/2023   Medicare Annual Wellness Visit  11/19/2024   Mammogram  08/20/2025   Colon Cancer Screening  03/27/2032   DTaP/Tdap/Td vaccine (3 - Td or Tdap) 07/27/2033   Pneumonia Vaccine  Completed   Flu Shot  Completed   DEXA scan (bone density measurement)  Completed   Hepatitis C Screening  Completed   Zoster (Shingles) Vaccine  Completed   HPV Vaccine  Aged Out    Advanced directives: (In Chart) A copy of your advanced directives are scanned into your chart should your provider ever need it.  Next Medicare Annual Wellness Visit scheduled for next year: Yes 11/25/24 @ 10:50

## 2023-11-20 NOTE — Progress Notes (Signed)
 Subjective:   Amy Mcconnell is a 73 y.o. female who presents for Medicare Annual (Subsequent) preventive examination.  Visit Complete: Virtual I connected with  Amy Mcconnell on 11/20/23 by a audio enabled telemedicine application and verified that I am speaking with the correct person using two identifiers. This patient declined Interactive audio and Acupuncturist. Therefore the visit was completed with audio only.   Patient Location: Home  Provider Location: Office/Clinic  I discussed the limitations of evaluation and management by telemedicine. The patient expressed understanding and agreed to proceed.  Vital Signs: Because this visit was a virtual/telehealth visit, some criteria may be missing or patient reported. Any vitals not documented were not able to be obtained and vitals that have been documented are patient reported.  .  Cardiac Risk Factors include: advanced age (>42men, >64 women);dyslipidemia;hypertension     Objective:    Today's Vitals   11/20/23 1006  Weight: 128 lb (58.1 kg)  Height: 5\' 7"  (1.702 m)   Body mass index is 20.05 kg/m.     11/20/2023   10:21 AM 11/04/2023    3:41 AM 08/05/2023    6:39 PM 05/06/2023   12:16 PM 11/16/2022    8:26 AM 03/27/2022    8:07 AM 11/14/2021    2:36 PM  Advanced Directives  Does Patient Have a Medical Advance Directive? Yes Yes No No Yes Yes Yes  Type of Estate agent of Amy Mcconnell;Living will Healthcare Power of [place];Living will   Healthcare Power of [place];Living will Healthcare Power of [place];Living will Healthcare Power of [place];Living will  Does patient want to make changes to medical advance directive? No - Patient declined    No - Patient declined  No - Patient declined  Copy of Healthcare Power of Attorney in Chart? Yes - validated most recent copy scanned in chart (See row information)    Yes - validated most recent copy scanned in chart (See row information) No - copy  requested Yes - validated most recent copy scanned in chart (See row information)  Would patient like information on creating a medical advance directive?   No - Patient declined        Current Medications (verified) Outpatient Encounter Medications as of 11/20/2023  Medication Sig   apixaban  (ELIQUIS ) 5 MG TABS tablet Take 1 tablet (5 mg total) by mouth 2 (two) times daily.   atorvastatin  (LIPITOR) 20 MG tablet Take 1 tablet by mouth once daily   B Complex Vitamins (VITAMIN B COMPLEX PO) Take 1 tablet by mouth daily.   Bacillus Coagulans-Inulin (ALIGN PREBIOTIC-PROBIOTIC PO) Take by mouth.   calcium  carbonate (OS-CAL) 600 MG TABS tablet Take 600 mg by mouth daily.    Cholecalciferol (VITAMIN D3) 50 MCG (2000 UT) capsule Take 2,000 Units by mouth daily.   diphenhydrAMINE  (BENADRYL ) 25 mg capsule Take 25 mg by mouth daily as needed for allergies.   docusate sodium  (COLACE) 100 MG capsule Take 100 mg by mouth 2 (two) times daily.   doxazosin  (CARDURA ) 2 MG tablet TAKE 1 TABLET BY MOUTH TWICE DAILY (MAY  TAKE  EXTRA  TABLET  AS  NEEDED  FOR  BP  GREATER  THAN  160)   EPINEPHrine  (EPIPEN  2-PAK) 0.3 mg/0.3 mL IJ SOAJ injection Inject 0.3 mg into the muscle as needed for anaphylaxis.   fexofenadine (ALLEGRA) 180 MG tablet Take 180 mg by mouth daily as needed for allergies.   Flaxseed, Linseed, (FLAXSEED OIL PO) Take 1 capsule by mouth daily.  furosemide  (LASIX ) 20 MG tablet Take 1 tablet (20 mg total) by mouth 3 (three) times a week.   lithium  carbonate (LITHOBID ) 300 MG ER tablet Take 2 tablets (600 mg total) by mouth at bedtime.   magnesium oxide (MAG-OX) 400 MG tablet Take 400 mg by mouth daily.   methimazole (TAPAZOLE) 10 MG tablet Take 1 tablet by mouth daily.   Multiple Vitamins-Minerals (VISION FORMULA/LUTEIN) TABS Take 1 tablet by mouth in the morning and at bedtime.   Omega-3 Fatty Acids (FISH OIL) 1000 MG CAPS Take 3,000 mg by mouth daily.   Oxcarbazepine  (TRILEPTAL ) 300 MG tablet TAKE 1  TABLET BY MOUTH IN THE MORNING AND 2 AT BEDTIME   potassium chloride  SA (KLOR-CON  M20) 20 MEQ tablet Take 1 tablet (20 mEq total) by mouth 3 (three) times a week.   propranolol  (INDERAL ) 20 MG tablet Take 1 tablet (20 mg total) by mouth 3 (three) times daily as needed. For heart rate greater then 100   propranolol  ER (INDERAL  LA) 60 MG 24 hr capsule Take 1 capsule (60 mg total) by mouth daily.   vitamin B-12 (CYANOCOBALAMIN ) 1000 MCG tablet Take 1,000 mcg by mouth daily.   vitamin E  1000 UNIT capsule Take 1,000 Units by mouth daily.   zinc  gluconate 50 MG tablet Take 50 mg by mouth daily.   ziprasidone  (GEODON ) 20 MG capsule Take 1 capsule (20 mg total) by mouth daily with supper.   No facility-administered encounter medications on file as of 11/20/2023.    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 Verdie Mcconnell - every 3 months   Broken foot    left    Contact dermatitis and other eczema due to other specified agent    Contusion of unspecified part of lower limb    Essential hypertension, benign    Heart murmur    New onset a-fib (HCC) 08/05/2023   Other and unspecified hyperlipidemia    Other specified disorders of thyroid     Reflux esophagitis    Scalp laceration 04/13/2019   Tachycardia    a. isolated episode, seen in ED with negative work up   Past Surgical History:  Procedure Laterality Date   CATARACT EXTRACTION     COLONOSCOPY WITH PROPOFOL  N/A 03/27/2022   Procedure: COLONOSCOPY WITH PROPOFOL ;  Surgeon: Amy Sink, MD;  Location: [facility] ENDOSCOPY;  Service: Endoscopy;  Laterality: N/A;   hamertoe     HAMMER TOE SURGERY  11/30/2021   right 4th  toe 11/10/21 Dr. Michalene Mcconnell   SALPINGECTOMY Left    STERILIZATION     Her decision since dz of bipolar   Family History  Problem Relation Age of Onset   Arrhythmia Mother        A-Fib   Heart failure Mother    Hyperlipidemia Mother    Hypertension Mother    Alzheimer's disease Mother    Hypertension Father    Hyperlipidemia Father    Mental retardation Father 15       Suicide   Hypertension Brother    Hyperlipidemia Brother    Hypertension Brother    Hyperlipidemia Brother    Asthma Brother    COPD Brother    Hyperlipidemia Brother  Hypertension Brother    Alcohol abuse Brother    Depression Brother    Emphysema Brother    Hyperlipidemia Brother    Hypertension Brother    Breast cancer Maternal Aunt    Bipolar disorder Paternal Uncle    Bipolar disorder Paternal Uncle    Bipolar disorder Paternal Uncle    Bipolar disorder Paternal Grandmother    Social History   Socioeconomic History   Marital status: Divorced    Spouse name: Not on file   Number of children: 0   Years of education: 14   Highest education level: Not on file  Occupational History   Not on file  Tobacco Use   Smoking status: Former    Current packs/day: 0.00    Average packs/day: 0.5 packs/day for 13.0 years (6.5 ttl pk-yrs)    Types: Cigarettes    Start date: 01/23/1987    Quit date: 01/23/2000    Years since quitting: 23.8   Smokeless tobacco: Never  Vaping Use   Vaping status: Never Used  Substance and Sexual Activity   Alcohol use: No   Drug use: No   Sexual activity: Not Currently  Other Topics Concern   Not on file  Social History Narrative   Ms. Randall grew up in [place], [place]. She lives in [place]. Ms. Trom is divorced. She has 2 cats (Charlie and Angie). 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 She also volunteers 2 days at [facility]. She enjoys reading, garding and cooking.   Social Drivers of Corporate investment banker Strain: Low Risk  (11/20/2023)   Overall  Financial Resource Strain (CARDIA)    Difficulty of Paying Living Expenses: Not hard at all  Food Insecurity: No Food Insecurity (11/20/2023)   Hunger Vital Sign    Worried About Running Out of Food in the Last Year: Never true    Ran Out of Food in the Last Year: Never true  Transportation Needs: No Transportation Needs (11/20/2023)   PRAPARE - Administrator, Civil Service (Medical): No    Lack of Transportation (Non-Medical): No  Physical Activity: Sufficiently Active (11/20/2023)   Exercise Vital Sign    Days of Exercise per Week: 3 days    Minutes of Exercise per Session: 60 min  Stress: No Stress Concern Present (11/20/2023)   [employer] of Occupational Health - Occupational Stress Questionnaire    Feeling of Stress : Only a little  Social Connections: Moderately Integrated (11/20/2023)   Social Connection and Isolation Panel [NHANES]    Frequency of Communication with Friends and Family: More than three times a week    Frequency of Social Gatherings with Friends and Family: More than three times a week    Attends Religious Services: More than 4 times per year    Active Member of [employer] or Organizations: Yes    Attends Engineer, structural: More than 4 times per year    Marital Status: Divorced    Tobacco Counseling Counseling given: Not Answered   Clinical Intake:  Pre-visit preparation completed: Yes  Pain : No/denies pain     BMI - recorded: 20.05 Nutritional Status: BMI of 19-24  Normal Nutritional Risks: None Diabetes: No  How often do you need to have someone help you when you read instructions, pamphlets, or other written materials from your doctor or pharmacy?: 1 - Never  Interpreter Needed?: No  Information entered by :: R. Micheale Schlack LPN   Activities of Daily Living  11/20/2023   10:08 AM  In your present state of health, do you have any difficulty performing the following activities:  Hearing? 0  Vision? 0  Comment glasses   Difficulty concentrating or making decisions? 0  Walking or climbing stairs? 0  Dressing or bathing? 0  Doing errands, shopping? 0  Preparing Food and eating ? N  Using the Toilet? N  In the past six months, have you accidently leaked urine? N  Do you have problems with loss of bowel control? N  Managing your Medications? N  Managing your Finances? N  Housekeeping or managing your Housekeeping? N    Patient Care Team: Kent Pear, MD as PCP - General (Family Medicine) Jerelene Monday Deadra Everts, MD as PCP - Cardiology (Cardiology) Devorah Fonder, MD as Consulting Physician (Cardiology)  Indicate any recent Medical Services you may have received from other than [facility] providers in the past year (date may be approximate).     Assessment:   This is a routine wellness examination for [place].  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 Go to Review Flowsheets to unlock data.    Fall Risk    11/20/2023   10:10 AM 09/18/2023   10:39 AM 07/22/2023    2:37 PM 07/05/2023    8:01 AM 03/13/2023   10:19 AM  Fall Risk   Falls in the past year? 0 1 0 1 0  Number falls in past yr: 0 0 0 0 0  Injury with Fall? 0 1 0 1 0  Risk for fall due to : No Fall Risks History of fall(s) No Fall Risks History of fall(s) No Fall Risks  Follow up Falls prevention discussed;Falls evaluation completed Falls evaluation completed Falls evaluation completed Falls evaluation completed Falls evaluation completed     MEDICARE RISK AT HOME: Medicare Risk at Home Any stairs in or around the home?: Yes If so, are there any without handrails?: No Home free of loose throw rugs in walkways, pet beds, electrical cords, etc?: No Adequate lighting in your home to reduce risk of falls?: Yes Life alert?: No Use of a cane, walker or w/c?: No Grab bars in the bathroom?: No Shower chair or bench in shower?: Yes Elevated toilet seat or a handicapped toilet?: Yes     Cognitive Function:    11/06/2017    4:33 PM  MMSE - Mini Mental State Exam  Orientation to time 5  Orientation to Place 5  Registration 3  Attention/ Calculation 5  Recall 3  Language- name 2 objects 2  Language- repeat 1  Language- follow 3 step command 3  Language- read & follow direction 1  Write a sentence 1  Copy design 1  Total score 30  11/20/2023   10:21 AM 11/16/2022    8:42 AM 11/11/2020    2:17 PM 11/10/2019    8:54 AM 11/07/2018    2:33 PM  6CIT Screen  What Year? 0 points 0 points 0 points 0 points 0 points  What month? 0 points 0 points 0 points 0 points 0 points  What time? 0 points 0 points 0 points 0 points 0 points  Count back from 20 0 points 0 points 0 points 0 points 0 points  Months in reverse 0 points 0 points 0 points 0 points 0 points  Repeat phrase 0 points 0 points  0 points 0 points  Total Score 0 points 0 points  0 points 0 points    Immunizations Immunization History  Administered Date(s) Administered   Fluad Quad(high Dose 65+) 08/09/2021   Influenza, High Dose Seasonal PF 07/23/2018, 08/23/2020   Influenza-Unspecified 08/05/2014, 07/06/2016, 01/06/2020   Moderna Covid-19 Vaccine Bivalent Booster 32yrs & up 09/19/2021   PFIZER(Purple Top)SARS-COV-2 Vaccination 11/23/2019, 12/15/2019, 08/26/2020, 03/03/2021   PNEUMOCOCCAL CONJUGATE-20 07/28/2023   Pfizer(Comirnaty )Fall Seasonal Vaccine 12 years and older 07/17/2023   Pneumococcal Conjugate-13 01/23/2016   Pneumococcal Polysaccharide-23  08/22/2011, 01/16/2019   Tdap 12/29/2014, 07/28/2023   Zoster Recombinant(Shingrix) 07/23/2018, 09/25/2018   Zoster, Live 12/29/2013    TDAP status: Up to date  Flu Vaccine status: Up to date  Pneumococcal vaccine status: Up to date  Covid-19 vaccine status: Completed vaccines  Qualifies for Shingles Vaccine? Yes   Zostavax completed Yes   Shingrix Completed?: Yes  Screening Tests Health Maintenance  Topic Date Due   COVID-19 Vaccine (7 - 2024-25 season) 09/11/2023   Medicare Annual Wellness (AWV)  11/17/2023   INFLUENZA VACCINE  02/03/2024 (Originally 06/06/2023)   MAMMOGRAM  08/20/2025   Colonoscopy  03/27/2032   DTaP/Tdap/Td (3 - Td or Tdap) 07/27/2033   Pneumonia Vaccine 61+ Years old  Completed   DEXA SCAN  Completed   Hepatitis C Screening  Completed   Zoster Vaccines- Shingrix  Completed   HPV VACCINES  Aged Out    Health Maintenance  Health Maintenance Due  Topic Date Due   COVID-19 Vaccine (7 - 2024-25 season) 09/11/2023   Medicare Annual Wellness (AWV)  11/17/2023    Colorectal cancer screening: No longer required.   Mammogram status: Completed 08/2023. Repeat every year  Bone Density status: Completed 04/2021. Results reflect: Bone density results: NORMAL. Repeat every 2 years.  Lung Cancer Screening: (Low Dose CT Chest recommended if Age 40-80 years, 20 pack-year currently smoking OR have quit w/in 15years.) does not qualify.    Additional Screening:  Hepatitis C Screening: does qualify; Completed 07/2021  Vision Screening: Recommended annual ophthalmology exams for early detection of glaucoma and other disorders of the eye. Is the patient up to date with their annual eye exam?  Yes  Who is the provider or what is the name of the office in which the patient attends annual eye exams? [facility]  If pt is not established with a provider, would they like to be referred to a provider to establish care? No .   Dental Screening: Recommended annual  dental exams for proper oral hygiene    Community Resource Referral / Chronic Care Management: CRR required this visit?  No   CCM required this visit?  No     Plan:     I have personally reviewed and noted the following in the patient's chart:   Medical and social history Use of alcohol, tobacco  or illicit drugs  Current medications and supplements including opioid prescriptions. Patient is not currently taking opioid prescriptions. Functional ability and status Nutritional status Physical activity Advanced directives List of other physicians Hospitalizations, surgeries, and ER visits in previous 12 months Vitals Screenings to include cognitive, depression, and falls Referrals and appointments  In addition, I have reviewed and discussed with patient certain preventive protocols, quality metrics, and best practice recommendations. A written personalized care plan for preventive services as well as general preventive health recommendations were provided to patient.     Felicitas Horse, LPN   1/61/0960   After Visit Summary: (MyChart) Due to this being a telephonic visit, the after visit summary with patients personalized plan was offered to patient via MyChart   Nurse Notes: None

## 2023-11-25 ENCOUNTER — Telehealth: Payer: Self-pay | Admitting: Family Medicine

## 2023-11-25 NOTE — Telephone Encounter (Signed)
Left message to call the office back to see if she had the barium swallow test done that GI ordered? Is she still having trouble swallowing? Okay to find out these answers and send to [facility] clinical.

## 2023-11-25 NOTE — Telephone Encounter (Signed)
Please reach out to the patient and see if she had the barium swallow test that was ordered by GI?  Is she still having trouble swallowing?

## 2023-11-26 ENCOUNTER — Telehealth: Payer: Self-pay

## 2023-11-26 NOTE — Telephone Encounter (Signed)
Copied from CRM [id number]. Topic: General - Other >> Nov 26, 2023  1:57 PM Amy Mcconnell D wrote: Reason for CRM: Patient call back stating that she did not get the barium swallow test done .Stated the 2 solutions she had to drink she could not drink so they canceled the order. Patient stated she hasn't had any problems swallowing since being prescribed medication . Patient states she has a follow up appointment in April to do repeat ultrasound and labwork

## 2023-11-26 NOTE — Telephone Encounter (Signed)
See other telephone encounter.

## 2023-11-27 ENCOUNTER — Ambulatory Visit: Payer: 59 | Admitting: Psychiatry

## 2023-11-27 NOTE — Telephone Encounter (Signed)
Noted. Can you confirm that the appointment that is mentioned is with GI? Thanks.

## 2023-11-27 NOTE — Telephone Encounter (Signed)
Noted.  I would suggest she follow-up with GI to see if they would like to do an endoscopy.  She should contact them for follow-up.

## 2023-11-28 ENCOUNTER — Telehealth: Payer: Self-pay

## 2023-11-28 NOTE — Telephone Encounter (Signed)
Pt was scheduled for a barium swallow. Pt stated she went for test and found that berries was in one of the solutions of which she can not have. The other solution has iodine in it. She has hyperthyroidism and can't have iodine. At the exam, the Radiologist found out she has had anaphylaxis in the past. After hearing about her allergies. He didn't think she needed to have the scan as it could be dangerous so they cancelled procedure. Dr. Purvis Sheffield office has called her several times wanting her to reschedule. He stated if she can do the barium swallow, then he wants her to have an EGD. She stated with the medication her Endocrinologist has her on she is not having any swallowing issues at this time.

## 2023-11-28 NOTE — Telephone Encounter (Signed)
Spoke with patient to let her know I will be cancelling the barium swallow- patient stated she is not having any swallowing issues at this time- she is scheduled to have thyroid BX  done in April -she does not want to proceed with EGD at this time- she stated she will call back if any issues occur.   Pt was scheduled for a barium swallow. Pt stated she went for test and found that berries was in one of the solutions of which she can not have. The other solution has iodine in it. She has hyperthyroidism and can't have iodine. At the exam, the Radiologist found out she has had anaphylaxis in the past. After hearing about her allergies. He didn't think she needed to have the scan as it could be dangerous so they cancelled procedure. Dr. Purvis Sheffield office has called her several times wanting her to reschedule. He stated if she can do the barium swallow, then he wants her to have an EGD. She stated with the medication her Endocrinologist has her on she is not having any swallowing issues at this time.   Cancel barium swallow test.  Notify patient that if she has any more dysphagia symptoms, then we can schedule an upper endoscopy procedure.  Patient is scheduled for cardiac PET scan 12/19/2023 to evaluate chest pain.  EGD procedure would need to be done after that, after she receives cardiac clearance. Celso Amy, PA-C   Cancel barium swallow test.  Notify patient that if she has any more dysphagia symptoms, then we can schedule an upper endoscopy procedure.  Patient is scheduled for cardiac PET scan 12/19/2023 to evaluate chest pain.  EGD procedure would need to be done after that, after she receives cardiac clearance.  Celso Amy, PA-C

## 2023-11-28 NOTE — Telephone Encounter (Signed)
Pt.notified

## 2023-12-04 ENCOUNTER — Encounter: Payer: Self-pay | Admitting: Psychiatry

## 2023-12-04 ENCOUNTER — Ambulatory Visit (INDEPENDENT_AMBULATORY_CARE_PROVIDER_SITE_OTHER): Payer: 59 | Admitting: Psychiatry

## 2023-12-04 VITALS — BP 130/76 | HR 60 | Temp 96.4°F | Ht 67.0 in | Wt 132.6 lb

## 2023-12-04 DIAGNOSIS — Z79899 Other long term (current) drug therapy: Secondary | ICD-10-CM | POA: Diagnosis not present

## 2023-12-04 DIAGNOSIS — F5101 Primary insomnia: Secondary | ICD-10-CM

## 2023-12-04 DIAGNOSIS — F3176 Bipolar disorder, in full remission, most recent episode depressed: Secondary | ICD-10-CM | POA: Diagnosis not present

## 2023-12-04 MED ORDER — LITHIUM CARBONATE ER 300 MG PO TBCR: 600.0000 mg | EXTENDED_RELEASE_TABLET | Freq: Every day | ORAL | 3 refills | Status: DC

## 2023-12-04 NOTE — Progress Notes (Unsigned)
BH MD OP Progress Note  12/04/2023 12:58 PM Amy Mcconnell  MRN:  [id number]  Chief Complaint:  Chief Complaint  Patient presents with   Follow-up   Depression   Insomnia   Medication Refill   HPI: Amy Mcconnell is a 73 year old Caucasian female who is currently divorced, lives in [place] has a history of bipolar disorder, primary insomnia, essential hypertension, tachycardia, hyperlipidemia, heart murmur, thyroid abnormalities was evaluated in office today.  The patient today reports her mood symptoms as currently stable on the current medication regimen.  She does report recent anxiety due to recent medical problems.  She experienced stabbing chest pain on the left side, which prompted a visit to the emergency room on December 30th. During the ER visit,  blood pressure was noted to be very high, although the chest pain subsided. Diagnostic tests, including troponin levels and a chest X-ray, were normal.  She has a history of atrial fibrillation diagnosed in 2024 and is currently on multiple medications, including Eliquis and propranolol. Her recent EKG showed a consistent pattern.  She is currently following up With cardiology and has upcoming PET scan scheduled.  She is managing thyroid issues, with a TSH level that was low, and has an upcoming appointment with an endocrinologist on April 24th. She has a thyroid nodule that may require biopsy and is on antithyroid medication, which was recently adjusted due to improved lab results.  She is on several psychiatric medications, including Geodon 20 mg, lithium 600 mg at bedtime, and Trileptal 600 mg at bedtime. No issues with depression, anxiety, or sleep disturbances are reported. The last lithium level check was in September, and she plans to have it repeated soon.  She works at a hospital three times a week, which she enjoys as it keeps her active and social.  Patient currently denies any suicidality, homicidality or perceptual  disturbances.    Visit Diagnosis:    ICD-10-CM   1. Bipolar disorder, in full remission, most recent episode depressed (HCC)  F31.76 lithium carbonate (LITHOBID) 300 MG ER tablet    2. Primary insomnia  F51.01     3. High risk medication use  Z79.899 Lithium level      Past Psychiatric History: I have reviewed past psychiatric history from progress note on 01/04/2022.  Past trials of medications-multiple.  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 Alycia Rossetti - every 3 months   Broken foot    left    Contact dermatitis and other eczema due to other specified agent    Contusion of unspecified part of lower limb    Essential hypertension, benign    Heart murmur    New onset a-fib (HCC) 08/05/2023   Other and unspecified hyperlipidemia    Other specified disorders of thyroid    Reflux esophagitis    Scalp laceration 04/13/2019   Tachycardia    a. isolated episode, seen in ED with negative work up    Past Surgical History:  Procedure Laterality Date   CATARACT EXTRACTION     COLONOSCOPY WITH PROPOFOL N/A 03/27/2022   Procedure: COLONOSCOPY WITH PROPOFOL;  Surgeon: Midge Minium, MD;  Location: [facility] ENDOSCOPY;  Service: Endoscopy;  Laterality: N/A;   hamertoe     HAMMER TOE SURGERY  11/30/2021   right 4th toe 11/10/21 Dr. Lilian Kapur   SALPINGECTOMY Left  STERILIZATION     Her decision since dz of bipolar    Family Psychiatric History: I have reviewed family psychiatric history from progress note on 01/04/2022.  Family History:  Family History  Problem Relation Age of Onset   Arrhythmia Mother        A-Fib   Heart failure Mother    Hyperlipidemia Mother    Hypertension Mother    Alzheimer's disease Mother    Hypertension Father    Hyperlipidemia Father    Mental retardation Father 88       Suicide   Hypertension Brother    Hyperlipidemia  Brother    Hypertension Brother    Hyperlipidemia Brother    Asthma Brother    COPD Brother    Hyperlipidemia Brother    Hypertension Brother    Alcohol abuse Brother    Depression Brother    Emphysema Brother    Hyperlipidemia Brother    Hypertension Brother    Breast cancer Maternal Aunt    Bipolar disorder Paternal Uncle    Bipolar disorder Paternal Uncle    Bipolar disorder Paternal Uncle    Bipolar disorder Paternal Grandmother     Social History: I have reviewed social history from progress note on 01/04/2022. Social History   Socioeconomic History   Marital status: Divorced    Spouse name: Not on file   Number of children: 0   Years of education: 14   Highest education level: Not on file  Occupational History   Not on file  Tobacco Use   Smoking status: Former    Current packs/day: 0.00    Average packs/day: 0.5 packs/day for 13.0 years (6.5 ttl pk-yrs)    Types: Cigarettes    Start date: 01/23/1987    Quit date: 01/23/2000    Years since quitting: 23.8   Smokeless tobacco: Never  Vaping Use   Vaping status: Never Used  Substance and Sexual Activity   Alcohol use: No   Drug use: No   Sexual activity: Not Currently  Other Topics Concern   Not on file  Social History Narrative   Ms. Pultz grew up in [place], [place]. She lives in [place]. Ms. Delbridge is divorced. She has 2 cats (Charlie and Angie). 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 She also volunteers 2 days at [facility]. She enjoys reading, garding and cooking.   Social Drivers of Corporate investment banker Strain: Low Risk  (11/20/2023)   Overall Financial Resource Strain (CARDIA)    Difficulty of Paying Living Expenses: Not hard at all  Food Insecurity: No Food Insecurity (11/20/2023)   Hunger Vital Sign    Worried About Running Out of Food in the Last Year: Never true    Ran Out of Food in the Last Year: Never true  Transportation Needs: No Transportation Needs (11/20/2023)   PRAPARE -  Administrator, Civil Service (Medical): No    Lack of Transportation (Non-Medical): No  Physical Activity: Sufficiently Active (11/20/2023)   Exercise Vital Sign    Days of Exercise per Week: 3 days    Minutes of Exercise per Session: 60 min  Stress: No Stress Concern Present (11/20/2023)   [employer] of Occupational Health - Occupational Stress Questionnaire    Feeling of Stress : Only a little  Social Connections: Moderately Integrated (11/20/2023)   Social Connection and Isolation Panel [NHANES]    Frequency of Communication with Friends and Family: More than three times a week  Frequency of Social Gatherings with Friends and Family: More than three times a week    Attends Religious Services: More than 4 times per year    Active Member of Clubs or Organizations: Yes    Attends Engineer, structural: More than 4 times per year    Marital Status: Divorced    Allergies:  Allergies  Allergen Reactions   Apple Juice Anaphylaxis   Other Other (See Comments)    Certain food and fragrances , certain cleaners , has allergic response inside her mouth , like tongue swelling and also has bloating from it   Ace Inhibitors     Angioedema    Angiotensin Receptor Blockers     Angioedema    Benicar [Olmesartan]     Mouth & Lip Swelling, "I swell from the waist down". Throat swelling   Beta Adrenergic Blockers Swelling   Poison Ivy Extract [Poison Ivy Extract]     Blisters   Purell Instant Hand [Alcohol] Swelling   Triclosan     Metabolic Disorder Labs: Lab Results  Component Value Date   HGBA1C 5.5 07/05/2023   MPG 96.8 11/27/2022   Lab Results  Component Value Date   PROLACTIN 17.5 03/28/2023   PROLACTIN 26.1 (H) 03/07/2023   Lab Results  Component Value Date   CHOL 179 07/21/2022   TRIG 63 07/21/2022   HDL 75 07/21/2022   CHOLHDL 2.4 07/21/2022   VLDL 13 07/21/2022   LDLCALC 91 07/21/2022   LDLCALC 58 06/23/2021   Lab Results  Component  Value Date   TSH <0.010 (L) 08/13/2023   TSH <0.010 (L) 08/05/2023    Therapeutic Level Labs: Lab Results  Component Value Date   LITHIUM 0.63 12/05/2023   LITHIUM 0.47 (L) 07/31/2023   No results found for: "VALPROATE" No results found for: "CBMZ"  Current Medications: Current Outpatient Medications  Medication Sig Dispense Refill   BOOSTRIX 5-2.5-18.5 LF-MCG/0.5 injection      apixaban (ELIQUIS) 5 MG TABS tablet Take 1 tablet (5 mg total) by mouth 2 (two) times daily. 180 tablet 3   atorvastatin (LIPITOR) 20 MG tablet Take 1 tablet by mouth once daily 90 tablet 3   B Complex Vitamins (VITAMIN B COMPLEX PO) Take 1 tablet by mouth daily.     Bacillus Coagulans-Inulin (ALIGN PREBIOTIC-PROBIOTIC PO) Take by mouth.     calcium carbonate (OS-CAL) 600 MG TABS tablet Take 600 mg by mouth daily.      Cholecalciferol (VITAMIN D3) 50 MCG (2000 UT) capsule Take 2,000 Units by mouth daily.     diphenhydrAMINE (BENADRYL) 25 mg capsule Take 25 mg by mouth daily as needed for allergies.     docusate sodium (COLACE) 100 MG capsule Take 100 mg by mouth 2 (two) times daily.     doxazosin (CARDURA) 2 MG tablet TAKE 1 TABLET BY MOUTH TWICE DAILY (MAY  TAKE  EXTRA  TABLET  AS  NEEDED  FOR  BP  GREATER  THAN  160) 180 tablet 3   EPINEPHrine (EPIPEN 2-PAK) 0.3 mg/0.3 mL IJ SOAJ injection Inject 0.3 mg into the muscle as needed for anaphylaxis. 2 each 0   fexofenadine (ALLEGRA) 180 MG tablet Take 180 mg by mouth daily as needed for allergies.     Flaxseed, Linseed, (FLAXSEED OIL PO) Take 1 capsule by mouth daily.     furosemide (LASIX) 20 MG tablet Take 1 tablet (20 mg total) by mouth 3 (three) times a week. 12 tablet 3   lithium carbonate (  LITHOBID) 300 MG ER tablet Take 2 tablets (600 mg total) by mouth at bedtime. 60 tablet 3   magnesium oxide (MAG-OX) 400 MG tablet Take 400 mg by mouth daily.     methimazole (TAPAZOLE) 10 MG tablet Take 1 tablet by mouth daily.     Multiple Vitamins-Minerals (VISION  FORMULA/LUTEIN) TABS Take 1 tablet by mouth in the morning and at bedtime.     Omega-3 Fatty Acids (FISH OIL) 1000 MG CAPS Take 3,000 mg by mouth daily.     Oxcarbazepine (TRILEPTAL) 300 MG tablet TAKE 1 TABLET BY MOUTH IN THE MORNING AND 2 AT BEDTIME 90 tablet 3   potassium chloride SA (KLOR-CON M20) 20 MEQ tablet Take 1 tablet (20 mEq total) by mouth 3 (three) times a week. 12 tablet 3   propranolol (INDERAL) 20 MG tablet Take 1 tablet (20 mg total) by mouth 3 (three) times daily as needed. For heart rate greater then 100 270 tablet 3   propranolol (INDERAL) 60 MG tablet Take by mouth once.     propranolol ER (INDERAL LA) 60 MG 24 hr capsule Take 1 capsule (60 mg total) by mouth daily. 90 capsule 3   vitamin B-12 (CYANOCOBALAMIN) 1000 MCG tablet Take 1,000 mcg by mouth daily.     vitamin E 1000 UNIT capsule Take 1,000 Units by mouth daily.     zinc gluconate 50 MG tablet Take 50 mg by mouth daily.     ziprasidone (GEODON) 20 MG capsule Take 1 capsule (20 mg total) by mouth daily with supper. 30 capsule 5   No current facility-administered medications for this visit.     Musculoskeletal: Strength & Muscle Tone: within normal limits Gait & Station: normal Patient leans: N/A  Psychiatric Specialty Exam: Review of Systems  Psychiatric/Behavioral: Negative.      Blood pressure 130/76, pulse 60, temperature (!) 96.4 F (35.8 C), temperature source Skin, height 5\' 7"  (1.702 m), weight 132 lb 9.6 oz (60.1 kg).Body mass index is 20.77 kg/m.  General Appearance: Fairly Groomed  Eye Contact:  Fair  Speech:  Clear and Coherent  Volume:  Normal  Mood:  Euthymic  Affect:  Appropriate  Thought Process:  Goal Directed and Descriptions of Associations: Intact  Orientation:  Full (Time, Place, and Person)  Thought Content: Logical   Suicidal Thoughts:  No  Homicidal Thoughts:  No  Memory:  Immediate;   Fair Recent;   Fair Remote;   Fair  Judgement:  Fair  Insight:  Fair  Psychomotor  Activity:  Normal  Concentration:  Concentration: Fair and Attention Span: Fair  Recall:  [employer] of Knowledge: Fair  Language: Fair  Akathisia:  No  Handed:  Right  AIMS (if indicated): done  Assets:  Desire for Improvement Housing Social Support  ADL's:  Intact  Cognition: WNL  Sleep:  Fair   Screenings: Midwife Visit from 12/04/2023 in [facility] Office Visit from 07/30/2023 in [facility] Office Visit from 11/06/2022 in [facility] Office Visit from 04/09/2022 in [facility] Office Visit from 01/04/2022 in [facility]  AIMS Total Score 0 0 0 0 0      GAD-7    Flowsheet Row Office Visit from 12/04/2023 in [facility] Office Visit from 09/18/2023 in [facility] [employer] Visit from 07/30/2023 in  [facility] Office Visit from 07/22/2023 in [facility] Visit from 07/05/2023 in [facility] [employer] [facility]  Total GAD-7 Score 0 0 0 0 0      Mini-Mental    Flowsheet Row Clinical Support from 11/06/2017 in [facility] [employer]  Total Score (max 30 points ) 30      PHQ2-9    Flowsheet Row Office Visit from 12/04/2023 in [facility] Clinical Support from 11/20/2023 in [facility] Visit from 09/18/2023 in [facility] [employer] Visit from 07/30/2023 in [facility] Office Visit from 07/22/2023 in [facility]  PHQ-2 Total Score 0 0 0 0 0  PHQ-9 Total  Score -- 1 2 -- 5      Flowsheet Row Office Visit from 12/04/2023 in [facility] ED from 11/04/2023 in [facility] Department at [facility] ED from 08/05/2023 in [facility] at [facility]  C-SSRS RISK CATEGORY No Risk No Risk No Risk        Assessment and Plan: Amy Mcconnell is a 73 year old Caucasian female who has a history of bipolar disorder, multiple medical problems with recent visit to the emergency department for chest pain currently under the care of cardiology , discussed assessment and plan as noted below.  Bipolar Disorder in remission Well-managed on Geodon 20 mg, lithium 600 mg at bedtime, and Trileptal 600 mg at bedtime. Last lithium level check was in September 2024. Patient prefers to continue current regimen due to its effectiveness and her active lifestyle.   - Order lithium level test   - Continue Geodon 20 mg daily - Continue lithium 600 mg at bedtime   - Continue Trileptal 600 mg at bedtime    Primary insomnia-stable Patient currently reports sleep is overall good. - Continue sleep hygiene techniques.  High risk medication use-reviewed and discussed BUN/creatinine dated 11/04/2023-within normal limits, sodium-within normal limits, AST ALT-within normal limits, platelet counts-161-within normal limits Will order lithium level-last 1 was in September, 07/31/2023-0.47-subtherapeutic Patient to go to [facility] lab to repeat lithium level this week.   Follow-up Follow-up in clinic in 3 to 4 months or sooner if needed.  Collaboration of Care: Collaboration of Care: Other I have reviewed notes from emergency department visit dated 11/04/2023-cardiology visit dated 11/18/2023-Mr.Dunn -patient is scheduled for PET scan in February.  Patient/Guardian was advised Release of Information must be obtained prior to any record release in order to collaborate their care with an outside provider.  Patient/Guardian was advised if they have not already done so to contact the registration department to sign all necessary forms in order for [place] to release information regarding their care.   Consent: Patient/Guardian gives verbal consent for treatment and assignment of benefits for services provided during this visit. Patient/Guardian expressed understanding and agreed to proceed.   This note was generated in part or whole with voice recognition software. Voice recognition is usually quite accurate but there are transcription errors that can and very often do occur. I apologize for any typographical errors that were not detected and corrected.    Jomarie Longs, MD 12/05/2023, 9:29 PM

## 2023-12-05 ENCOUNTER — Other Ambulatory Visit
Admission: RE | Admit: 2023-12-05 | Discharge: 2023-12-05 | Disposition: A | Discharge status: Home / Self Care | Payer: Medicare Other | Attending: Psychiatry | Admitting: Psychiatry

## 2023-12-05 DIAGNOSIS — Z79899 Other long term (current) drug therapy: Secondary | ICD-10-CM | POA: Diagnosis not present

## 2023-12-05 LAB — LITHIUM LEVEL: Lithium Lvl: 0.63 mmol/L (ref 0.60–1.20)

## 2023-12-17 ENCOUNTER — Encounter (HOSPITAL_COMMUNITY): Payer: Self-pay

## 2023-12-19 ENCOUNTER — Ambulatory Visit: Admission: RE | Admit: 2023-12-19 | Payer: 59 | Source: Ambulatory Visit

## 2023-12-22 ENCOUNTER — Other Ambulatory Visit: Payer: Self-pay | Admitting: Cardiovascular Disease

## 2023-12-29 ENCOUNTER — Other Ambulatory Visit: Payer: Self-pay | Admitting: Psychiatry

## 2023-12-29 DIAGNOSIS — F3176 Bipolar disorder, in full remission, most recent episode depressed: Secondary | ICD-10-CM

## 2024-01-01 DIAGNOSIS — K080 Exfoliation of teeth due to systemic causes: Secondary | ICD-10-CM | POA: Diagnosis not present

## 2024-01-07 ENCOUNTER — Encounter (HOSPITAL_COMMUNITY): Payer: Self-pay

## 2024-01-08 DIAGNOSIS — H524 Presbyopia: Secondary | ICD-10-CM | POA: Diagnosis not present

## 2024-01-09 ENCOUNTER — Ambulatory Visit
Admission: RE | Admit: 2024-01-09 | Discharge: 2024-01-09 | Disposition: A | Discharge status: Home / Self Care | Payer: 59 | Source: Ambulatory Visit | Attending: Physician Assistant | Admitting: Physician Assistant

## 2024-01-09 DIAGNOSIS — I700 Atherosclerosis of aorta: Secondary | ICD-10-CM | POA: Insufficient documentation

## 2024-01-09 DIAGNOSIS — I2510 Atherosclerotic heart disease of native coronary artery without angina pectoris: Secondary | ICD-10-CM | POA: Diagnosis not present

## 2024-01-09 DIAGNOSIS — R079 Chest pain, unspecified: Secondary | ICD-10-CM | POA: Diagnosis not present

## 2024-01-09 LAB — NM PET CT CARDIAC PERFUSION MULTI W/ABSOLUTE BLOODFLOW
LV dias vol: 83 mL (ref 46–106)
MBFR: 2.47
Nuc Rest EF: 53 %
Nuc Stress EF: 64 %
Peak HR: 88 {beats}/min
Rest HR: 60 {beats}/min
Rest MBF: 1.06 ml/g/min
Rest Nuclear Isotope Dose: 15.6 mCi
Rest perfusion cavity size (mL): 83 mL
SRS: 5
SSS: 6
ST Depression (mm): 0 mm
Stress MBF: 2.62 ml/g/min
Stress Nuclear Isotope Dose: 15.6 mCi
Stress perfusion cavity size (mL): 98 mL
TID: 0.96

## 2024-01-09 MED ORDER — RUBIDIUM RB82 GENERATOR (RUBYFILL)
25.0000 | PACK | Freq: Once | INTRAVENOUS | Status: AC
  Administered 2024-01-09: 15.61 via INTRAVENOUS

## 2024-01-09 MED ORDER — REGADENOSON 0.4 MG/5ML IV SOLN
INTRAVENOUS | Status: AC
  Filled 2024-01-09: qty 5

## 2024-01-09 MED ORDER — REGADENOSON 0.4 MG/5ML IV SOLN
0.4000 mg | Freq: Once | INTRAVENOUS | Status: AC
  Administered 2024-01-09: 0.4 mg via INTRAVENOUS
  Filled 2024-01-09: qty 5

## 2024-01-09 MED ORDER — RUBIDIUM RB82 GENERATOR (RUBYFILL)
25.0000 | PACK | Freq: Once | INTRAVENOUS | Status: AC
  Administered 2024-01-09: 15.64 via INTRAVENOUS

## 2024-01-19 NOTE — Progress Notes (Unsigned)
 Cardiology Office Note    Date:  01/22/2024   ID:  Amy, Mcconnell Apr 01, 1951, MRN [id number]  PCP:  Glori Luis, MD (Inactive)  Cardiologist:  Julien Nordmann, MD  Electrophysiologist:  None   Chief Complaint: Follow-up  History of Present Illness:   Amy Mcconnell is a 73 y.o. female with history of coronary artery calcification noted on CT imaging, PAF, hyperthyroidism, HTN, HLD, oral allergy syndrome, multiple medication intolerances, and prior tobacco use smoking 1/2 pack/day for 15 years who presents for follow-up of myocardial PET/CT.   She was seen by Dr. Mariah Milling in 08/2023 for ED follow-up of recently diagnosed A-fib with RVR in the ED in 07/2023.  She had recently been diagnosed with hyperthyroidism.  She had recently been started on methimazole.  She was started on propranolol and apixaban with recommendation to increase OAC to therapeutic dose.   Echo in 08/2023 showed an EF of 55 to 60%, no regional wall motion abnormalities, mild RV systolic function and ventricular cavity size, moderately elevated RVSP estimated at 53.3 mmHg, mild to moderate tricuspid regurgitation, and mild aortic insufficiency.   She was seen in the ED on 11/04/2023 with complaints of chest pain that woke her up from sleep around 2:00 that morning and lasted for 2-3 minutes spontaneous resolution.  No associated symptoms.  Blood pressure was elevated at 184/102, improved to 130/90 prior to discharge.  EKG showed sinus rhythm with nonspecific ST-T changes.  High-sensitivity troponin negative x 2.  Chest x-ray showed no acute cardiopulmonary process with emphysema and aortic atherosclerosis.  She followed up in the office on 11/18/2023 and was without further episodes of chest pain.  She had needed a couple doses of as needed propranolol for tachypalpitations.  She underwent a myocardial PET/CT on 01/09/2024 that showed no evidence of ischemia or infarction with an EF of 53% at rest and 64% at stress.   Coronary artery calcification was noted on CT attenuation corrected imaging in the LAD and RCA.  Overall, this was a low risk and normal study.  She comes in doing well from a cardiac perspective and is without symptoms of angina or cardiac decompensation.  No further chest pain.  No dizziness, presyncope, or syncope.  No falls, hematochezia, or melena.  Remains active at baseline, volunteering at the hospital 3 days/week.  Working on adjusting diet.  Overall feels well from a cardiac perspective.   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 Alycia Rossetti - every 3 months   Broken foot    left    Contact dermatitis and other eczema due to other specified agent    Contusion of unspecified part of lower limb    Essential hypertension, benign    Graves disease    Heart murmur    New onset a-fib (HCC) 08/05/2023   Other and unspecified hyperlipidemia    Other specified disorders of thyroid    Reflux esophagitis    Scalp laceration 04/13/2019   Tachycardia    a. isolated episode, seen in ED with negative work up    Past Surgical History:  Procedure Laterality Date   CATARACT EXTRACTION  COLONOSCOPY WITH PROPOFOL N/A 03/27/2022   Procedure: COLONOSCOPY WITH PROPOFOL;  Surgeon: Midge Minium, MD;  [facility]: [facility] END[facility];  Service: Endoscopy;  Laterality: N/A;   hamertoe     HAMMER TOE SURGERY  11/30/2021   right 4th toe 11/10/21 Dr. Lilian Kapur   SALPINGECTOMY Left    STERILIZATION     Her decision since dz of bipolar    Current Medications: Current Meds  Medication Sig   apixaban (ELIQUIS) 5 MG TABS tablet Take 1 tablet (5 mg total) by mouth 2 (two) times daily.   atorvastatin (LIPITOR) 20 MG tablet  Take 1 tablet by mouth once daily   B Complex Vitamins (VITAMIN B COMPLEX PO) Take 1 tablet by mouth daily.   Bacillus Coagulans-Inulin (ALIGN PREBIOTIC-PROBIOTIC PO) Take by mouth.   BOOSTRIX 5-2.5-18.5 LF-MCG/0.5 injection    calcium carbonate (OS-CAL) 600 MG TABS tablet Take 600 mg by mouth daily.    Cholecalciferol (VITAMIN D3) 50 MCG (2000 UT) capsule Take 2,000 Units by mouth daily.   diphenhydrAMINE (BENADRYL) 25 mg capsule Take 25 mg by mouth daily as needed for allergies.   docusate sodium (COLACE) 100 MG capsule Take 100 mg by mouth 2 (two) times daily.   doxazosin (CARDURA) 2 MG tablet TAKE 1 TABLET BY MOUTH TWICE DAILY (MAY  TAKE  EXTRA  TABLET  AS  NEEDED  FOR  BP  GREATER  THAN  160)   EPINEPHrine (EPIPEN 2-PAK) 0.3 mg/0.3 mL IJ SOAJ injection Inject 0.3 mg into the muscle as needed for anaphylaxis.   fexofenadine (ALLEGRA) 180 MG tablet Take 180 mg by mouth daily as needed for allergies.   Flaxseed, Linseed, (FLAXSEED OIL PO) Take 1 capsule by mouth daily.   furosemide (LASIX) 20 MG tablet TAKE 1 TABLET BY MOUTH THREE TIMES A WEEK   KLOR-CON M20 20 MEQ tablet TAKE ONE TABLET BY MOUTH 3 TIMES A WEEK   lithium carbonate (LITHOBID) 300 MG ER tablet Take 2 tablets (600 mg total) by mouth at bedtime.   magnesium oxide (MAG-OX) 400 MG tablet Take 400 mg by mouth daily.   methimazole (TAPAZOLE) 10 MG tablet Take 1 tablet by mouth daily.   Multiple Vitamins-Minerals (VISION FORMULA/LUTEIN) TABS Take 1 tablet by mouth in the morning and at bedtime.   Omega-3 Fatty Acids (FISH OIL) 1000 MG CAPS Take 3,000 mg by mouth daily.   Oxcarbazepine (TRILEPTAL) 300 MG tablet TAKE 1 TABLET BY MOUTH IN THE MORNING AND 2 AT BEDTIME   propranolol (INDERAL) 20 MG tablet Take 1 tablet (20 mg total) by mouth 3 (three) times daily as needed. For heart rate greater then 100   propranolol (INDERAL) 60 MG tablet Take by mouth once.   propranolol ER (INDERAL LA) 60 MG 24 hr capsule Take 1 capsule (60 mg total)  by mouth daily.   vitamin B-12 (CYANOCOBALAMIN) 1000 MCG tablet Take 1,000 mcg by mouth daily.   vitamin E 1000 UNIT capsule Take 1,000 Units by mouth daily.   zinc gluconate 50 MG tablet Take 50 mg by mouth daily.   ziprasidone (GEODON) 20 MG capsule Take 1 capsule (20 mg total) by mouth daily with supper.    Allergies:   Apple juice, Other, Ace inhibitors, Angiotensin receptor blockers, Benicar [olmesartan], Beta adrenergic blockers, Poison ivy extract [poison ivy extract], Purell instant hand [alcohol], and Triclosan   Social History   Socioeconomic History   Marital status: Divorced    Spouse name: Not on file   Number of children: 0  Years of education: 80   Highest education level: Not on file  Occupational History   Not on file  Tobacco Use   Smoking status: Former    Current packs/day: 0.00    Average packs/day: 0.5 packs/day for 13.0 years (6.5 ttl pk-yrs)    Types: Cigarettes    Start date: 01/23/1987    Quit date: 01/23/2000    Years since quitting: 24.0   Smokeless tobacco: Never  Vaping Use   Vaping status: Never Used  Substance and Sexual Activity   Alcohol use: No   Drug use: No   Sexual activity: Not Currently  Other Topics Concern   Not on file  Social History Narrative   Ms. Bachmann grew up in [place], [place]. She lives in [place]. Ms. Ricketts is divorced. She has 2 cats (Charlie and Angie). She volunteers at [facility] [employer] [place] flea [employer]. She also volunteers 2 days at [facility]. She enjoys reading, garding and cooking.   Social Drivers of Corporate investment banker Strain: Low Risk  (11/20/2023)   Overall Financial Resource Strain (CARDIA)    Difficulty of Paying Living Expenses: Not hard at all  Food Insecurity: No Food Insecurity (11/20/2023)   Hunger Vital Sign    Worried About Running Out of Food in the Last Year: Never true    Ran Out of Food in the Last Year: Never true  Transportation Needs: No Transportation Needs (11/20/2023)   PRAPARE -  Administrator, Civil Service (Medical): No    Lack of Transportation (Non-Medical): No  Physical Activity: Sufficiently Active (11/20/2023)   Exercise Vital Sign    Days of Exercise per Week: 3 days    Minutes of Exercise per Session: 60 min  Stress: No Stress Concern Present (11/20/2023)   [employer] of Occupational Health - Occupational Stress Questionnaire    Feeling of Stress : Only a little  Social Connections: Moderately Integrated (11/20/2023)   Social Connection and Isolation Panel [NHANES]    Frequency of Communication with Friends and Family: More than three times a week    Frequency of Social Gatherings with Friends and Family: More than three times a week    Attends Religious Services: More than 4 times per year    Active Member of [employer] or Organizations: Yes    Attends Engineer, structural: More than 4 times per year    Marital Status: Divorced     Family History:  The patient's family history includes Alcohol abuse in her brother; Alzheimer's disease in her mother; Arrhythmia in her mother; Asthma in her brother; Bipolar disorder in her paternal grandmother, paternal uncle, paternal uncle, and paternal uncle; Breast cancer in her maternal aunt; COPD in her brother; Depression in her brother; Emphysema in her brother; Heart failure in her mother; Hyperlipidemia in her brother, brother, brother, brother, father, and mother; Hypertension in her brother, brother, brother, brother, father, and mother; Mental retardation (age of onset: 62) in her father.  ROS:   12-point review of systems is negative unless otherwise noted in the HPI.   EKGs/Labs/Other Studies Reviewed:    Studies reviewed were summarized above. The additional studies were reviewed today:  2D echo 09/05/2023: 1. Left ventricular ejection fraction, by estimation, is 55 to 60%. The  left ventricle has normal function. The left ventricle has no regional  wall motion abnormalities. There  is mild left ventricular hypertrophy.  Left ventricular diastolic parameters  are consistent with Grade I diastolic dysfunction (impaired relaxation).  2. Right ventricular systolic function is normal. The right ventricular  size is normal. There is moderately elevated pulmonary artery systolic  pressure. The estimated right ventricular systolic pressure is 53.3 mmHg.   3. The mitral valve is normal in structure. No evidence of mitral valve  regurgitation. No evidence of mitral stenosis.   4. Tricuspid valve regurgitation is mild to moderate.   5. The aortic valve is tricuspid. Aortic valve regurgitation is mild. No  aortic stenosis is present.   6. The inferior vena cava is dilated in size with >50% respiratory  variability, suggesting right atrial pressure of 8 mmHg.  __________  Myocardial PET/CT 01/09/2024:   LV perfusion is normal. There is no evidence of ischemia. There is no evidence of infarction.   Rest left ventricular function is normal. Rest EF: 53%. Stress left ventricular function is normal. Stress EF: 64%. End diastolic cavity size is normal.   Myocardial blood flow was computed to be 1.3ml/g/min at rest and 2.32ml/g/min at stress. Global myocardial blood flow reserve was 2.47 and was normal.   Coronary calcium was present on the attenuation correction CT images. Severe coronary calcifications were present. Coronary calcifications were present in the left anterior descending artery and right coronary artery distribution(s).   The study is normal. The study is low risk.   EKG:  EKG is ordered today.  The EKG ordered today demonstrates sinus bradycardia, 56 bpm, LVH with early repolarization abnormality, nonspecific ST-T changes  Recent Labs: 08/13/2023: TSH <0.010 11/04/2023: ALT 25; BUN 30; Creatinine, Ser 0.71; Hemoglobin 14.1; Platelets 161; Potassium 4.0; Sodium 138  Recent Lipid Panel    Component Value Date/Time   CHOL 179 07/21/2022 1328   TRIG 63 07/21/2022 1328    HDL 75 07/21/2022 1328   CHOLHDL 2.4 07/21/2022 1328   VLDL 13 07/21/2022 1328   LDLCALC 91 07/21/2022 1328   LDLDIRECT 94 11/24/2021 1426    PHYSICAL EXAM:    VS:  BP 136/74 (BP Location: Left Arm, Patient Position: Sitting, Cuff Size: Normal)   Pulse (!) 56   Ht 5\' 7"  (1.702 m)   Wt 134 lb 3.2 oz (60.9 kg)   SpO2 97%   BMI 21.02 kg/m   BMI: Body mass index is 21.02 kg/m.  Physical Exam Vitals reviewed.  Constitutional:      Appearance: She is well-developed.  HENT:     Head: Normocephalic and atraumatic.  Eyes:     General:        Right eye: No discharge.        Left eye: No discharge.  Cardiovascular:     Rate and Rhythm: Normal rate and regular rhythm.     Heart sounds: Normal heart sounds, S1 normal and S2 normal. Heart sounds not distant. No midsystolic click and no opening snap. No murmur heard.    No friction rub.  Pulmonary:     Effort: Pulmonary effort is normal. No respiratory distress.     Breath sounds: Normal breath sounds. No decreased breath sounds, wheezing, rhonchi or rales.  Chest:     Chest wall: No tenderness.  Musculoskeletal:     Cervical back: Normal range of motion.  Skin:    General: Skin is warm and dry.     Nails: There is no clubbing.  Neurological:     Mental Status: She is alert and oriented to person, place, and time.  Psychiatric:        Speech: Speech normal.  Behavior: Behavior normal.        Thought Content: Thought content normal.        Judgment: Judgment normal.     Wt Readings from Last 3 Encounters:  01/22/24 134 lb 3.2 oz (60.9 kg)  11/20/23 128 lb (58.1 kg)  11/18/23 128 lb 3.2 oz (58.2 kg)     ASSESSMENT & PLAN:   CAD involving the native coronary arteries without angina/HLD: She is doing well and without symptoms concerning for angina or cardiac decompensation.  Recent myocardial PET/CT showed no evidence of ischemia or infarction and was overall low risk.  CT attenuation corrected imaging on his stress  test demonstrated coronary artery calcification involving the LAD and RCA.  Most recent LDL of 91 from 2023.  Target LDL less than 70.  Obtain fasting lipid and liver function.  She remains on apixaban in place of aspirin given underlying A-fib along with atorvastatin 20 mg.  No indication for further ischemic testing at this time.  PAF: Maintaining sinus rhythm on propranolol.  CHA2DS2-VASc at least 4 (HTN, age x 1, vascular disease, sex category).  Remains on apixaban 5 mg twice daily and does not meet reduced dosing criteria.  No falls or symptoms concerning for bleeding.  Recent labs stable.  HTN: Blood pressure is well-controlled in the office today with recheck blood pressure of 128/70.  She remains on propranolol and Cardura.  Hyperthyroidism: On propranolol and methimazole.  Management by endocrinology.    Disposition: F/u with Dr. Mariah Milling or an APP in 6 months.   Medication Adjustments/Labs and Tests Ordered: Current medicines are reviewed at length with the patient today.  Concerns regarding medicines are outlined above. Medication changes, Labs and Tests ordered today are summarized above and listed in the Patient Instructions accessible in Encounters.   Signed, Eula Listen, PA-C 01/22/2024 5:13 PM     [facility] [place] [address] [place], [place] [address] [phone]

## 2024-01-22 ENCOUNTER — Encounter: Payer: Self-pay | Admitting: Physician Assistant

## 2024-01-22 ENCOUNTER — Ambulatory Visit: Payer: Medicare Other | Attending: Physician Assistant | Admitting: Physician Assistant

## 2024-01-22 VITALS — BP 136/74 | HR 56 | Ht 67.0 in | Wt 134.2 lb

## 2024-01-22 DIAGNOSIS — I2510 Atherosclerotic heart disease of native coronary artery without angina pectoris: Secondary | ICD-10-CM | POA: Diagnosis not present

## 2024-01-22 DIAGNOSIS — E785 Hyperlipidemia, unspecified: Secondary | ICD-10-CM

## 2024-01-22 DIAGNOSIS — I480 Paroxysmal atrial fibrillation: Secondary | ICD-10-CM

## 2024-01-22 DIAGNOSIS — E0590 Thyrotoxicosis, unspecified without thyrotoxic crisis or storm: Secondary | ICD-10-CM | POA: Diagnosis not present

## 2024-01-22 DIAGNOSIS — Z79899 Other long term (current) drug therapy: Secondary | ICD-10-CM

## 2024-01-22 NOTE — Patient Instructions (Signed)
 Medication Instructions:   Your Physician recommend you continue on your current medication as directed.     *If you need a refill on your cardiac medications before your next appointment, please call your pharmacy*   Lab Work: Your provider would like for you to return tomorrow morning to have the following labs drawn: Lipid Panel and Liver Function Test.   Please go to [facility] [address] ([facility] [address], [place] [address] You do not need an appointment.  They are open from 8 am- 4:30 pm.  Lunch from 1:00 pm- 2:00 pm You will need to be fasting.   If you have labs (blood work) drawn today and your tests are completely normal, you will receive your results only by: MyChart Message (if you have MyChart) OR A paper copy in the mail If you have any lab test that is abnormal or we need to change your treatment, we will call you to review the results.   Testing/Procedures: None ordered   Follow-Up: At [facility], you and your health needs are our priority.  As part of our continuing mission to provide you with exceptional heart care, we have created designated Provider Care Teams.  These Care Teams include your primary Cardiologist (physician) and Advanced Practice Providers (APPs -  Physician Assistants and Nurse Practitioners) who all work together to provide you with the care you need, when you need it.  We recommend signing up for the patient portal called "MyChart".  Sign up information is provided on this After Visit Summary.  MyChart is used to connect with patients for Virtual Visits (Telemedicine).  Patients are able to view lab/test results, encounter notes, upcoming appointments, etc.  Non-urgent messages can be sent to your provider as well.   To learn more about what you can do with MyChart, go to [email or web address].    Your next appointment:   6 month(s)  Provider:   You may see Julien Nordmann, MD or one of the  following Advanced Practice Providers on your designated Care Team:    Eula Listen, [place]

## 2024-01-23 DIAGNOSIS — Z79899 Other long term (current) drug therapy: Secondary | ICD-10-CM | POA: Diagnosis not present

## 2024-01-24 ENCOUNTER — Telehealth: Payer: Self-pay | Admitting: Physician Assistant

## 2024-01-24 ENCOUNTER — Other Ambulatory Visit: Payer: Self-pay

## 2024-01-24 DIAGNOSIS — E785 Hyperlipidemia, unspecified: Secondary | ICD-10-CM

## 2024-01-24 DIAGNOSIS — I480 Paroxysmal atrial fibrillation: Secondary | ICD-10-CM

## 2024-01-24 LAB — HEPATIC FUNCTION PANEL
ALT: 31 IU/L (ref 0–32)
AST: 25 IU/L (ref 0–40)
Albumin: 4.6 g/dL (ref 3.8–4.8)
Alkaline Phosphatase: 250 IU/L — ABNORMAL HIGH (ref 44–121)
Bilirubin Total: 0.2 mg/dL (ref 0.0–1.2)
Bilirubin, Direct: 0.09 mg/dL (ref 0.00–0.40)
Total Protein: 7.1 g/dL (ref 6.0–8.5)

## 2024-01-24 LAB — LIPID PANEL
Chol/HDL Ratio: 2.7 ratio (ref 0.0–4.4)
Cholesterol, Total: 189 mg/dL (ref 100–199)
HDL: 70 mg/dL (ref >39)
LDL Chol Calc (NIH): 101 mg/dL — ABNORMAL HIGH (ref 0–99)
Triglycerides: 102 mg/dL (ref 0–149)
VLDL Cholesterol Cal: 18 mg/dL (ref 5–40)

## 2024-01-24 MED ORDER — ATORVASTATIN CALCIUM 40 MG PO TABS: 40.0000 mg | ORAL_TABLET | Freq: Every day | ORAL | 3 refills | Status: AC

## 2024-01-24 NOTE — Telephone Encounter (Signed)
Left message for patient, Refill sent to the pharmacy electronically.

## 2024-01-24 NOTE — Telephone Encounter (Signed)
 Pt c/o medication issue:  1. Name of Medication: atorvastatin (LIPITOR) 20 MG tablet   2. How are you currently taking this medication (dosage and times per day)? Take 1 tablet by mouth once daily   3. Are you having a reaction (difficulty breathing--STAT)? No   4. What is your medication issue? Eula Listen was changing patient's atorvastatin (LIPITOR) 20 MG tablet to atorvastatin (LIPITOR) 40 MG tablet and wanted to know what pharmacy the patient want it to be sent to. Patient said that she want it to be sent to [facility] [address] [place] - [address]

## 2024-01-25 ENCOUNTER — Other Ambulatory Visit: Payer: Self-pay | Admitting: Cardiovascular Disease

## 2024-02-01 ENCOUNTER — Other Ambulatory Visit: Payer: Self-pay | Admitting: Psychiatry

## 2024-02-01 DIAGNOSIS — F3176 Bipolar disorder, in full remission, most recent episode depressed: Secondary | ICD-10-CM

## 2024-02-04 ENCOUNTER — Emergency Department
Admission: EM | Admit: 2024-02-04 | Discharge: 2024-02-04 | Disposition: A | Discharge status: Home / Self Care | Attending: Emergency Medicine | Admitting: Emergency Medicine

## 2024-02-04 ENCOUNTER — Other Ambulatory Visit: Payer: Self-pay

## 2024-02-04 DIAGNOSIS — S61211A Laceration without foreign body of left index finger without damage to nail, initial encounter: Secondary | ICD-10-CM | POA: Insufficient documentation

## 2024-02-04 DIAGNOSIS — W260XXA Contact with knife, initial encounter: Secondary | ICD-10-CM | POA: Insufficient documentation

## 2024-02-04 DIAGNOSIS — Z7901 Long term (current) use of anticoagulants: Secondary | ICD-10-CM | POA: Diagnosis not present

## 2024-02-04 MED ORDER — LIDOCAINE HCL (PF) 1 % IJ SOLN
5.0000 mL | Freq: Once | INTRAMUSCULAR | Status: AC
Start: 2024-02-04 — End: 2024-02-04
  Administered 2024-02-04: 5 mL via INTRADERMAL
  Filled 2024-02-04: qty 5

## 2024-02-04 NOTE — Discharge Instructions (Signed)
 Stitches need to be removed in 7-10 days.  This can be done by your primary care provider urgent care or the emergency department.  Please leave the bandage intact for tonight.  After this you can wash your finger with soap and water then pat dry.  Cover with a bandage if you would like.  Watch for signs of infection including redness, warmth, swelling, pain and pus drainage.  If you develop any of these please return to the ED, urgent care or your primary care provider.

## 2024-02-04 NOTE — ED Triage Notes (Signed)
 Pt to ED via POV from home. Pt reports was cutting frozen chicken nuggets and cut her left pointer finger with the knife. Pt is on Eliquis. Laceration actively bleeding in triage. Laceration wrapped.

## 2024-02-04 NOTE — ED Provider Notes (Signed)
 [facility] Provider Note    Event Date/Time   First MD Initiated Contact with Patient 02/04/24 2119     (approximate)   History   Laceration   HPI  Amy Mcconnell is a 73 y.o. female with PMH of A-fib on Eliquis who presents for evaluation of a laceration to the left index finger.  Patient was cutting something with a kitchen knife when she cut her fingertip.      Physical Exam   Triage Vital Signs: ED Triage Vitals  Encounter Vitals Group     BP 02/04/24 1841 (!) 152/101     Systolic BP Percentile --      Diastolic BP Percentile --      Pulse Rate 02/04/24 1841 73     Resp 02/04/24 1841 20     Temp 02/04/24 1841 98 F (36.7 C)     Temp Source 02/04/24 1841 Oral     SpO2 02/04/24 1841 100 %     Weight --      Height --      Head Circumference --      Peak Flow --      Pain Score 02/04/24 1842 3     Pain Loc --      Pain Education --      Exclude from Growth Chart --     Most recent vital signs: Vitals:   02/04/24 2105 02/04/24 2248  BP: (!) 171/87 (!) 155/85  Pulse: 68 69  Resp: 18 17  Temp: 98.2 F (36.8 C) 98 F (36.7 C)  SpO2: 98% 99%   General: Awake, no distress.  CV:  Good peripheral perfusion.  Resp:  Normal effort.  Abd:  No distention. Other:  Approximately 2 cm laceration to the palmar side of the left pointer fingertip.  Actively bleeding.  Neurovascularly intact distal to the wound.  Patient able to fully flex and extend the finger.  ED Results / Procedures / Treatments   Labs (all labs ordered are listed, but only abnormal results are displayed) Labs Reviewed - No data to display   PROCEDURES:  Critical Care performed: No  .Laceration Repair  Date/Time: 02/04/2024 10:49 PM  Performed by: Cameron Ali, PA-C Authorized by: Cameron Ali, PA-C   Consent:    Consent obtained:  Verbal   Consent given by:  Patient   Risks, benefits, and alternatives were discussed: yes     Risks discussed:   Infection, pain, poor cosmetic result and poor wound healing   Alternatives discussed:  No treatment Universal protocol:    Patient identity confirmed:  Verbally with patient Anesthesia:    Anesthesia method:  Nerve block and local infiltration   Local anesthetic:  Lidocaine 1% w/o epi   Block location:  Base of the left index finger   Block needle gauge:  25 G   Block anesthetic:  Lidocaine 1% w/o epi   Block technique:  Flexor tendon sheath and ring block   Block injection procedure:  Anatomic landmarks identified, introduced needle and incremental injection   Block outcome:  Incomplete block Laceration details:    Location:  Finger   Finger location:  L index finger   Length (cm):  2   Depth (mm):  3 Pre-procedure details:    Preparation:  Patient was prepped and draped in usual sterile fashion Exploration:    Hemostasis achieved with:  Direct pressure   Wound exploration: entire depth of wound visualized  Contaminated: no   Treatment:    Area cleansed with:  Povidone-iodine   Amount of cleaning:  Standard   Irrigation solution:  Sterile saline   Irrigation method:  Syringe Skin repair:    Repair method:  Sutures   Suture size:  6-0   Suture material:  Nylon   Suture technique:  Simple interrupted   Number of sutures:  3 Approximation:    Approximation:  Close Repair type:    Repair type:  Simple Post-procedure details:    Dressing:  Adhesive bandage   Procedure completion:  Tolerated well, no immediate complications    MEDICATIONS ORDERED IN ED: Medications  lidocaine (PF) (XYLOCAINE) 1 % injection 5 mL (5 mLs Intradermal Given by Other 02/04/24 2156)     IMPRESSION / MDM / ASSESSMENT AND PLAN / ED COURSE  I reviewed the triage vital signs and the nursing notes.                             73 year old female presents for evaluation of laceration to the left index finger.  Blood pressure is elevated otherwise vital signs are stable.  Patient NAD on  exam.  Differential diagnosis includes, but is not limited to, laceration, tendon injury, nerve injury, vascular injury.  Patient's presentation is most consistent with acute, uncomplicated illness.  Laceration repaired as described in the procedure note above.  Patient was instructed on wound care.  She will need to have the stitches removed in 7 to 10 days.  She voiced understanding, all questions were answered and she is stable at discharge.      FINAL CLINICAL IMPRESSION(S) / ED DIAGNOSES   Final diagnoses:  Laceration of left index finger without foreign body without damage to nail, initial encounter     Rx / DC Orders   ED Discharge Orders     None        Note:  This document was prepared using Dragon voice recognition software and may include unintentional dictation errors.   Cameron Ali, PA-C 02/04/24 2317    Minna Antis, MD 02/06/24 [phone]

## 2024-02-12 ENCOUNTER — Ambulatory Visit: Admitting: Nurse Practitioner

## 2024-02-12 VITALS — BP 138/84 | HR 57 | Temp 98.1°F | Ht 67.0 in | Wt 134.8 lb

## 2024-02-12 DIAGNOSIS — Z4802 Encounter for removal of sutures: Secondary | ICD-10-CM

## 2024-02-12 DIAGNOSIS — E0500 Thyrotoxicosis with diffuse goiter without thyrotoxic crisis or storm: Secondary | ICD-10-CM

## 2024-02-12 DIAGNOSIS — S61211D Laceration without foreign body of left index finger without damage to nail, subsequent encounter: Secondary | ICD-10-CM

## 2024-02-13 ENCOUNTER — Encounter: Payer: Self-pay | Admitting: Nurse Practitioner

## 2024-02-13 DIAGNOSIS — S61211A Laceration without foreign body of left index finger without damage to nail, initial encounter: Secondary | ICD-10-CM | POA: Insufficient documentation

## 2024-02-13 NOTE — Progress Notes (Signed)
 Bethanie Dicker, NP-C Phone: [phone]  Amy Mcconnell is a 73 y.o. female who presents today for suture removal.   Discussed the use of AI scribe software for clinical note transcription with the patient, who gave verbal consent to proceed.  History of Present Illness   Amy Mcconnell is a 73 year old female who presents for suture removal following a laceration on her index finger.  She sustained a laceration on her index finger on February 04, 2024, while cutting chicken with a sharp knife. She received three stitches at the emergency department on the day of the injury. The wound has been healing well without any drainage or signs of infection. She has been taking extra zinc, calcium, vitamin C, and vitamin A to aid in healing. There is a potential for decreased sensation in the area due to the density of nerve endings in the fingers. She has been wearing gloves at work to protect the wound and has been using hand sanitizer to prevent infection.  Her past medical history includes hyperthyroidism for which she has been taking methimazole since last year. She recently had a repeat ultrasound and lab work. She reports ongoing hair loss, which may be related to her thyroid condition.  She mentions an incident where her blood pressure was noted to be higher than normal, which she attributes to recent physical activity and stress from losing her keys while shopping.      Social History   Tobacco Use  Smoking Status Former   Current packs/day: 0.00   Average packs/day: 0.5 packs/day for 13.0 years (6.5 ttl pk-yrs)   Types: Cigarettes   Start date: 01/23/1987   Quit date: 01/23/2000   Years since quitting: 24.0  Smokeless Tobacco Never    Current Outpatient Medications on File Prior to Visit  Medication Sig Dispense Refill   apixaban (ELIQUIS) 5 MG TABS tablet Take 1 tablet (5 mg total) by mouth 2 (two) times daily. 180 tablet 3   atorvastatin (LIPITOR) 40 MG tablet Take 1 tablet (40 mg  total) by mouth daily. 90 tablet 3   B Complex Vitamins (VITAMIN B COMPLEX PO) Take 1 tablet by mouth daily.     Bacillus Coagulans-Inulin (ALIGN PREBIOTIC-PROBIOTIC PO) Take by mouth.     BOOSTRIX 5-2.5-18.5 LF-MCG/0.5 injection      calcium carbonate (OS-CAL) 600 MG TABS tablet Take 600 mg by mouth daily.      Cholecalciferol (VITAMIN D3) 50 MCG (2000 UT) capsule Take 2,000 Units by mouth daily.     diphenhydrAMINE (BENADRYL) 25 mg capsule Take 25 mg by mouth daily as needed for allergies.     docusate sodium (COLACE) 100 MG capsule Take 100 mg by mouth 2 (two) times daily.     doxazosin (CARDURA) 2 MG tablet TAKE 1 TABLET BY MOUTH TWICE DAILY (MAY  TAKE  EXTRA  TABLET  AS  NEEDED  FOR  BP  GREATER  THAN  160) 180 tablet 3   EPINEPHrine (EPIPEN 2-PAK) 0.3 mg/0.3 mL IJ SOAJ injection Inject 0.3 mg into the muscle as needed for anaphylaxis. 2 each 0   fexofenadine (ALLEGRA) 180 MG tablet Take 180 mg by mouth daily as needed for allergies.     Flaxseed, Linseed, (FLAXSEED OIL PO) Take 1 capsule by mouth daily.     furosemide (LASIX) 20 MG tablet TAKE 1 TABLET BY MOUTH THREE TIMES A WEEK 12 tablet 3   lithium carbonate (LITHOBID) 300 MG ER tablet Take 2 tablets (600 mg total)  by mouth at bedtime. 60 tablet 3   magnesium oxide (MAG-OX) 400 MG tablet Take 400 mg by mouth daily.     methimazole (TAPAZOLE) 10 MG tablet Take 1 tablet by mouth daily.     Multiple Vitamins-Minerals (VISION FORMULA/LUTEIN) TABS Take 1 tablet by mouth in the morning and at bedtime.     Omega-3 Fatty Acids (FISH OIL) 1000 MG CAPS Take 3,000 mg by mouth daily.     Oxcarbazepine (TRILEPTAL) 300 MG tablet TAKE 1 TABLET BY MOUTH IN THE MORNING AND 2 AT BEDTIME 90 tablet 5   potassium chloride SA (KLOR-CON M20) 20 MEQ tablet TAKE 1 TABLET BY MOUTH THREE TIMES A WEEK 12 tablet 3   propranolol (INDERAL) 20 MG tablet Take 1 tablet (20 mg total) by mouth 3 (three) times daily as needed. For heart rate greater then 100 270 tablet 3    propranolol (INDERAL) 60 MG tablet Take by mouth once.     propranolol ER (INDERAL LA) 60 MG 24 hr capsule Take 1 capsule (60 mg total) by mouth daily. 90 capsule 3   vitamin B-12 (CYANOCOBALAMIN) 1000 MCG tablet Take 1,000 mcg by mouth daily.     vitamin E 1000 UNIT capsule Take 1,000 Units by mouth daily.     zinc gluconate 50 MG tablet Take 50 mg by mouth daily.     ziprasidone (GEODON) 20 MG capsule TAKE 1 CAPSULE BY MOUTH ONCE DAILY WITH SUPPER 30 capsule 1   No current facility-administered medications on file prior to visit.     ROS see history of present illness  Objective  Physical Exam Vitals:   02/12/24 1359 02/12/24 1400  BP: (!) 144/86 138/84  Pulse: (!) 57   Temp: 98.1 F (36.7 C)   SpO2: 96%     BP Readings from Last 3 Encounters:  02/12/24 138/84  02/04/24 (!) 155/85  01/22/24 136/74   Wt Readings from Last 3 Encounters:  02/12/24 134 lb 12.8 oz (61.1 kg)  01/22/24 134 lb 3.2 oz (60.9 kg)  11/20/23 128 lb (58.1 kg)    Physical Exam Constitutional:      General: She is not in acute distress.    Appearance: Normal appearance.  HENT:     Head: Normocephalic.  Skin:    General: Skin is warm and dry.     Findings: Laceration present.     Comments: Well healed laceration of left index finger pad. 3 stitches present. No signs of infection noted.   Neurological:     General: No focal deficit present.     Mental Status: She is alert.  Psychiatric:        Mood and Affect: Mood normal.        Behavior: Behavior normal.   Suture Removal  Date/Time: 02/13/2024 5:02 PM  Performed by: Bethanie Dicker, NP Authorized by: Bethanie Dicker, NP  Location: index finger. Wound Appearance: clean Sutures Removed: 3 Post-removal: dressing applied and antibiotic ointment applied Patient tolerance: patient tolerated the procedure well with no immediate complications     Assessment/Plan: Please see individual problem list.  Laceration of left index finger without  foreign body without damage to nail, subsequent encounter Assessment & Plan: The laceration is well healed without infection. Stitches removed easily. Patient tolerated well. No complications. Band-aid applied to area.   Orders: -     Suture Removal  Graves' disease without crisis Assessment & Plan: She is on methimazole daily, continue. Recent ultrasound and labs have been reviewed. Hair loss  may be related to the thyroid condition or its treatment. Follow up with Endo as scheduled.      Return for TOC as scheduled.   Bethanie Dicker, NP-C [facility]

## 2024-02-13 NOTE — Assessment & Plan Note (Signed)
 The laceration is well healed without infection. Stitches removed easily. Patient tolerated well. No complications. Band-aid applied to area.

## 2024-02-13 NOTE — Assessment & Plan Note (Signed)
 She is on methimazole daily, continue. Recent ultrasound and labs have been reviewed. Hair loss may be related to the thyroid condition or its treatment. Follow up with Endo as scheduled.

## 2024-02-14 DIAGNOSIS — E042 Nontoxic multinodular goiter: Secondary | ICD-10-CM | POA: Diagnosis not present

## 2024-02-14 DIAGNOSIS — E0590 Thyrotoxicosis, unspecified without thyrotoxic crisis or storm: Secondary | ICD-10-CM | POA: Diagnosis not present

## 2024-02-26 DIAGNOSIS — E0590 Thyrotoxicosis, unspecified without thyrotoxic crisis or storm: Secondary | ICD-10-CM | POA: Diagnosis not present

## 2024-02-26 DIAGNOSIS — E042 Nontoxic multinodular goiter: Secondary | ICD-10-CM | POA: Diagnosis not present

## 2024-03-11 ENCOUNTER — Encounter: Payer: Self-pay | Admitting: Psychiatry

## 2024-03-11 ENCOUNTER — Ambulatory Visit (INDEPENDENT_AMBULATORY_CARE_PROVIDER_SITE_OTHER): Payer: Self-pay | Admitting: Psychiatry

## 2024-03-11 VITALS — BP 122/74 | HR 54 | Temp 97.8°F | Ht 67.0 in | Wt 135.2 lb

## 2024-03-11 DIAGNOSIS — F5101 Primary insomnia: Secondary | ICD-10-CM

## 2024-03-11 DIAGNOSIS — F3176 Bipolar disorder, in full remission, most recent episode depressed: Secondary | ICD-10-CM

## 2024-03-11 DIAGNOSIS — Z79899 Other long term (current) drug therapy: Secondary | ICD-10-CM | POA: Diagnosis not present

## 2024-03-11 NOTE — Progress Notes (Signed)
 [facility] MD OP Progress Note  03/11/2024 9:18 AM Amy Mcconnell  MRN:  [id number]  Chief Complaint:  Chief Complaint  Patient presents with   Follow-up   Depression   Insomnia   Discussed the use of AI scribe software for clinical note transcription with the patient, who gave verbal consent to proceed.  History of Present Illness Amy Mcconnell is a 73 year old Caucasian female who is currently divorced, lives in [place], has a history of bipolar disorder, primary insomnia, essential hypertension, tachycardia, hyperlipidemia, heart murmur, thyroid  abnormalities was evaluated in office today.  She is managing her bipolar disorder with lithium  600 mg at bedtime, Geodon  20 mg, and Trileptal  900 mg. She feels stable with her mood and sleep and is satisfied with her current state.Her sleep pattern includes going to bed around 11 PM and waking up at 5:30 AM, totaling about six and a half hours of sleep. She feels rested and can nap after work if needed.   Her thyroid  condition is managed with methimazole, recently reduced from 10 mg to 5 mg. She monitors for symptoms such as anxiety, depression, heart palpitations, sweats, appetite changes, dry skin, or constipation. She has experienced hair loss for about a year, possibly related to thyroid  changes. She is aware of the potential interference of biotin supplements with thyroid  test results.  A recent evaluation showed slight elevation in cholesterol levels, leading to an increase in her Lipitor dose from 20 mg to 40 mg. She is attempting dietary modifications, such as reducing butter and cheese intake, to manage her cholesterol levels.  She experiences a fine tremor of hands, more noticeable on the right side, which she attributes to thyroid  issues. It does not interfere with her daily activities. Additionally, she reports occasional lower back pain associated with increased physical activity at work, such as taking stairs instead of the elevator.  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 No issues with memory.she did well on a short memory test that was conducted in session today.  Denies any suicidality, homicidality or perceptual disturbances.  She denies any muscle spasms, rigidity or tardive dyskinesia symptoms from Geodon .    Visit Diagnosis:    ICD-10-CM   1. Bipolar disorder, in full remission, most recent episode depressed (HCC)  F31.76     2. Primary insomnia  F51.01     3. High risk medication use  Z79.899 Lithium  level    BUN    Creatinine, serum      Past Psychiatric History: I have reviewed past psychiatric history from progress note on 01/04/2022.  Past trials of medications-multiple.  Reports a history of suicide ideation and an attempt to walk into the ocean several years ago.  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 Verdie Gladden - every 3 months   Broken foot    left    Contact dermatitis and other eczema due to other specified agent    Contusion of unspecified part of lower limb    Essential hypertension, benign    Graves disease    Heart murmur    New onset a-fib (HCC) 08/05/2023   Other and unspecified hyperlipidemia    Other specified disorders of thyroid     Reflux esophagitis  Scalp laceration 04/13/2019   Tachycardia    a. isolated episode, seen in ED with negative work up    Past Surgical History:  Procedure Laterality Date   CATARACT EXTRACTION     COLONOSCOPY WITH PROPOFOL  N/A 03/27/2022   Procedure: COLONOSCOPY WITH PROPOFOL ;  Surgeon: Marnee Sink, MD;  Location[facility] END[facility]COPY;  Service: Endoscopy;  Laterality: N/A;   hamertoe     HAMMER TOE SURGERY  11/30/2021   right 4th toe 11/10/21 Dr. Michalene Agee   SALPINGECTOMY Left    STERILIZATION     Her  decision since dz of bipolar    Family Psychiatric History: I have reviewed family psychiatric history from progress note on 01/04/2022.  Family History:  Family History  Problem Relation Age of Onset   Arrhythmia Mother        A-Fib   Heart failure Mother    Hyperlipidemia Mother    Hypertension Mother    Alzheimer's disease Mother    Hypertension Father    Hyperlipidemia Father    Mental retardation Father 20       Suicide   Hypertension Brother    Hyperlipidemia Brother    Hypertension Brother    Hyperlipidemia Brother    Asthma Brother    COPD Brother    Hyperlipidemia Brother    Hypertension Brother    Alcohol abuse Brother    Depression Brother    Emphysema Brother    Hyperlipidemia Brother    Hypertension Brother    Breast cancer Maternal Aunt    Bipolar disorder Paternal Uncle    Bipolar disorder Paternal Uncle    Bipolar disorder Paternal Uncle    Bipolar disorder Paternal Grandmother     Social History: I have reviewed social history from progress note on 01/04/2022. Social History   Socioeconomic History   Marital status: Divorced    Spouse name: Not on file   Number of children: 0   Years of education: 14   Highest education level: Not on file  Occupational History   Not on file  Tobacco Use   Smoking status: Former    Current packs/day: 0.00    Average packs/day: 0.5 packs/day for 13.0 years (6.5 ttl pk-yrs)    Types: Cigarettes    Start date: 01/23/1987    Quit date: 01/23/2000    Years since quitting: 24.1   Smokeless tobacco: Never  Vaping Use   Vaping status: Never Used  Substance and Sexual Activity   Alcohol use: No   Drug use: No   Sexual activity: Not Currently  Other Topics Concern   Not on file  Social History Narrative   Ms. Clarkin grew up in [place], [place]. She lives in [place]. Ms. Feese is divorced. She has 2 cats (Charlie and Angie). 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 She also volunteers 2 days at [employer].  She enjoys reading, garding and cooking.   Social Drivers of Corporate investment banker Strain: Low Risk  (11/20/2023)   Overall Financial Resource Strain (CARDIA)    Difficulty of Paying Living Expenses: Not hard at all  Food Insecurity: No Food Insecurity (11/20/2023)   Hunger Vital Sign    Worried About Running Out of Food in the Last Year: Never true    Ran Out of Food in the Last Year: Never true  Transportation Needs: No Transportation Needs (11/20/2023)   PRAPARE - Administrator, Civil Service (Medical): No    Lack of Transportation (Non-Medical): No  Physical Activity: Sufficiently Active (11/20/2023)   Exercise Vital Sign    Days of Exercise per Week: 3 days    Minutes of Exercise per Session: 60 min  Stress: No Stress Concern Present (11/20/2023)   [employer] of Occupational Health - Occupational Stress Questionnaire    Feeling of Stress : Only a little  Social Connections: Moderately Integrated (11/20/2023)   Social Connection and Isolation Panel [NHANES]    Frequency of Communication with Friends and Family: More than three times a week    Frequency of Social Gatherings with Friends and Family: More than three times a week    Attends Religious Services: More than 4 times per year    Active Member of [employer] or Organizations: Yes    Attends Engineer, structural: More than 4 times per year    Marital Status: Divorced    Allergies:  Allergies  Allergen Reactions   Apple Juice Anaphylaxis   Other Other (See Comments)    Certain food and fragrances , certain cleaners , has allergic response inside her mouth , like tongue swelling and also has bloating from it   Ace Inhibitors     Angioedema    Angiotensin Receptor Blockers     Angioedema    Benicar [Olmesartan]     Mouth & Lip Swelling, "I swell from the waist down". Throat swelling   Beta Adrenergic Blockers Swelling   Poison Ivy Extract [Poison Ivy Extract]     Blisters   Purell Instant  Hand [Alcohol] Swelling   Triclosan     Metabolic Disorder Labs: Lab Results  Component Value Date   HGBA1C 5.5 07/05/2023   MPG 96.8 11/27/2022   Lab Results  Component Value Date   PROLACTIN 17.5 03/28/2023   PROLACTIN 26.1 (H) 03/07/2023   Lab Results  Component Value Date   CHOL 189 01/23/2024   TRIG 102 01/23/2024   HDL 70 01/23/2024   CHOLHDL 2.7 01/23/2024   VLDL 13 07/21/2022   LDLCALC 101 (H) 01/23/2024   LDLCALC 91 07/21/2022   Lab Results  Component Value Date   TSH <0.010 (L) 08/13/2023   TSH <0.010 (L) 08/05/2023    Therapeutic Level Labs: Lab Results  Component Value Date   LITHIUM  0.63 12/05/2023   LITHIUM  0.47 (L) 07/31/2023   No results found for: "VALPROATE" No results found for: "CBMZ"  Current Medications: Current Outpatient Medications  Medication Sig Dispense Refill   apixaban  (ELIQUIS ) 5 MG TABS tablet Take 1 tablet (5 mg total) by mouth 2 (two) times daily. 180 tablet 3   atorvastatin  (LIPITOR) 40 MG tablet Take 1 tablet (40 mg total) by mouth daily. 90 tablet 3   B Complex Vitamins (VITAMIN B COMPLEX PO) Take 1 tablet by mouth daily.     Bacillus Coagulans-Inulin (ALIGN PREBIOTIC-PROBIOTIC PO) Take by mouth.     BOOSTRIX 5-2.5-18.5 LF-MCG/0.5 injection      calcium  carbonate (OS-CAL) 600 MG TABS tablet Take 600 mg by mouth daily.      Cholecalciferol (VITAMIN D3) 50 MCG (2000 UT) capsule Take 2,000 Units by mouth daily.     diphenhydrAMINE  (BENADRYL ) 25 mg capsule Take 25 mg by mouth daily as needed for allergies.     docusate sodium  (COLACE) 100 MG capsule Take 100 mg by mouth 2 (two) times daily.     doxazosin  (CARDURA ) 2 MG tablet TAKE 1 TABLET BY MOUTH TWICE DAILY (MAY  TAKE  EXTRA  TABLET  AS  NEEDED  FOR  BP  GREATER  THAN  160) 180 tablet 3   EPINEPHrine  (EPIPEN  2-PAK) 0.3 mg/0.3 mL IJ SOAJ injection Inject 0.3 mg into the muscle as needed for anaphylaxis. 2 each 0   fexofenadine (ALLEGRA) 180 MG tablet Take 180 mg by mouth daily  as needed for allergies.     Flaxseed, Linseed, (FLAXSEED OIL PO) Take 1 capsule by mouth daily.     furosemide  (LASIX ) 20 MG tablet TAKE 1 TABLET BY MOUTH THREE TIMES A WEEK 12 tablet 3   lithium  carbonate (LITHOBID ) 300 MG ER tablet Take 2 tablets (600 mg total) by mouth at bedtime. 60 tablet 3   magnesium oxide (MAG-OX) 400 MG tablet Take 400 mg by mouth daily.     methimazole (TAPAZOLE) 5 MG tablet Take 5 mg by mouth.     Multiple Vitamins-Minerals (VISION FORMULA/LUTEIN) TABS Take 1 tablet by mouth in the morning and at bedtime.     Omega-3 Fatty Acids (FISH OIL) 1000 MG CAPS Take 3,000 mg by mouth daily.     Oxcarbazepine  (TRILEPTAL ) 300 MG tablet TAKE 1 TABLET BY MOUTH IN THE MORNING AND 2 AT BEDTIME 90 tablet 5   potassium chloride  SA (KLOR-CON  M20) 20 MEQ tablet TAKE 1 TABLET BY MOUTH THREE TIMES A WEEK 12 tablet 3   propranolol  (INDERAL ) 20 MG tablet Take 1 tablet (20 mg total) by mouth 3 (three) times daily as needed. For heart rate greater then 100 270 tablet 3   propranolol  (INDERAL ) 60 MG tablet Take by mouth once.     propranolol  ER (INDERAL  LA) 60 MG 24 hr capsule Take 1 capsule (60 mg total) by mouth daily. 90 capsule 3   vitamin B-12 (CYANOCOBALAMIN ) 1000 MCG tablet Take 1,000 mcg by mouth daily.     vitamin E  1000 UNIT capsule Take 1,000 Units by mouth daily.     zinc  gluconate 50 MG tablet Take 50 mg by mouth daily.     ziprasidone  (GEODON ) 20 MG capsule TAKE 1 CAPSULE BY MOUTH ONCE DAILY WITH SUPPER 30 capsule 1   No current facility-administered medications for this visit.     Musculoskeletal: Strength & Muscle Tone: within normal limits Gait & Station: normal Patient leans: N/A  Psychiatric Specialty Exam: Review of Systems  Psychiatric/Behavioral: Negative.      Blood pressure 122/74, pulse (!) 54, temperature 97.8 F (36.6 C), temperature source Temporal, height 5\' 7"  (1.702 m), weight 135 lb 3.2 oz (61.3 kg), SpO2 94%.Body mass index is 21.18 kg/m.  General  Appearance: Casual  Eye Contact:  Fair  Speech:  Normal Rate  Volume:  Normal  Mood:  Euthymic  Affect:  Congruent  Thought Process:  Goal Directed and Descriptions of Associations: Intact  Orientation:  Full (Time, Place, and Person)  Thought Content: Logical   Suicidal Thoughts:  No  Homicidal Thoughts:  No  Memory:  Immediate;   Fair Recent;   Fair Remote;   Fair  Judgement:  Fair  Insight:  Fair  Psychomotor Activity:  Normal  Concentration:  Concentration: Fair and Attention Span: Fair  Recall:  [employer] of Knowledge: Fair  Language: Fair  Akathisia:  No  Handed:  Right  AIMS (if indicated):  done  Assets:  Desire for Improvement Housing Social Support Transportation  ADL's:  Intact  Cognition: WNL  Sleep:  Fair   Screenings: Geneticist, molecular Office Visit from 03/11/2024 in [facility] Office Visit from 12/04/2023 in [facility] Office Visit from 07/30/2023 in [facility] Office Visit from 11/06/2022 in [facility] Office Visit from 04/09/2022 in [facility]  AIMS Total Score 0 0 0 0 0      GAD-7    Flowsheet Row Office Visit from 03/11/2024 in [facility] Office Visit from 12/04/2023 in [facility] Office Visit from 09/18/2023 in [facility] Visit from 07/30/2023 in [facility] Office Visit from 07/22/2023 in [facility] at [employer] [facility]  Total GAD-7 Score 1 0 0 0 0      Mini-Mental    Flowsheet Row Clinical Support from 11/06/2017 in [facility] [employer] [facility]  Total Score (max 30 points ) 30      PHQ2-9    Flowsheet Row Office Visit from  03/11/2024 in [facility] Office Visit from 12/04/2023 in [facility] Clinical Support from 11/20/2023 in [facility] Visit from 09/18/2023 in [facility] [employer] Visit from 07/30/2023 in [facility]  PHQ-2 Total Score 1 0 0 0 0  PHQ-9 Total Score -- -- 1 2 --      Flowsheet Row Office Visit from 03/11/2024 in [facility] ED from 02/04/2024 in [facility] Department at [facility] Visit from 12/04/2023 in [facility]  C-SSRS RISK CATEGORY Moderate Risk No Risk No Risk        Assessment and Plan: Amy Mcconnell is a 73 year old Caucasian female who has a history of bipolar disorder, primary insomnia, currently under the care of endocrinology for management of thyroid  abnormalities continues to be stable on current medication regimen for mood symptoms and sleep, discussed assessment and plan as noted below.  Bipolar disorder in remission Well-managed on the current combination of lithium , Geodon  and Trileptal . - Continue Lithium  600 mg at bedtime - Continue Trileptal  900 mg at bedtime - Continue Geodon  20 mg daily  Primary insomnia-stable Currently working on sleep hygiene and reports overall sleep is good. - Continue sleep hygiene techniques. - Geodon  may also help with sleep.  High risk medication use-will order lithium  level, BUN, creatinine.  Patient to go to [facility] lab.  Patient is currently under the care of endocrinology for management of thyroid  abnormalities and has upcoming thyroid  labs scheduled in 2 months from now.  We will await for lab results as ordered by endocrinology.  Follow-up Follow-up in clinic in 4 to 5 months or sooner if needed.    Collaboration of Care:  Collaboration of Care: Other patient agrees to follow up with endocrinology for management of thyroid  hormones.  Patient/Guardian was advised Release of Information must be obtained prior to any record release in order to collaborate their care with an outside provider. Patient/Guardian was advised if they have not already done so to contact the registration department to sign all necessary forms in order for us  to release information regarding their care.   Consent: Patient/Guardian gives verbal consent for treatment and assignment of benefits for services provided during this visit. Patient/Guardian expressed understanding and agreed to proceed.  This note was generated in part or whole with voice recognition software. Voice recognition is usually quite accurate but  there are transcription errors that can and very often do occur. I apologize for any typographical errors that were not detected and corrected.     Skiler Tye, MD 03/11/2024, 9:18 AM

## 2024-03-12 ENCOUNTER — Other Ambulatory Visit
Admission: RE | Admit: 2024-03-12 | Discharge: 2024-03-12 | Disposition: A | Discharge status: Home / Self Care | Attending: Psychiatry | Admitting: Psychiatry

## 2024-03-12 ENCOUNTER — Other Ambulatory Visit: Payer: Self-pay

## 2024-03-12 ENCOUNTER — Telehealth: Payer: Self-pay | Admitting: Psychiatry

## 2024-03-12 DIAGNOSIS — Z79899 Other long term (current) drug therapy: Secondary | ICD-10-CM | POA: Insufficient documentation

## 2024-03-12 LAB — CREATININE, SERUM
Creatinine, Ser: 0.86 mg/dL (ref 0.44–1.00)
GFR, Estimated: >60 mL/min (ref >60)

## 2024-03-12 LAB — BUN: BUN: 21 mg/dL (ref 8–23)

## 2024-03-12 LAB — LITHIUM LEVEL: Lithium Lvl: 0.61 mmol/L (ref 0.60–1.20)

## 2024-03-12 MED ORDER — COMIRNATY 30 MCG/0.3ML IM SUSY
PREFILLED_SYRINGE | INTRAMUSCULAR | 0 refills | Status: AC
  Filled 2024-03-12: qty 0.3, 1d supply, fill #0

## 2024-03-12 NOTE — Telephone Encounter (Signed)
 Lithium  level, renal function came back normal.

## 2024-03-13 NOTE — Telephone Encounter (Signed)
 Left message with labwork results

## 2024-03-18 ENCOUNTER — Encounter: Payer: Self-pay | Admitting: Nurse Practitioner

## 2024-03-18 ENCOUNTER — Ambulatory Visit (INDEPENDENT_AMBULATORY_CARE_PROVIDER_SITE_OTHER): Payer: Medicare Other | Admitting: Nurse Practitioner

## 2024-03-18 VITALS — BP 120/60 | HR 52 | Temp 98.2°F | Ht 67.0 in | Wt 134.4 lb

## 2024-03-18 DIAGNOSIS — I10 Essential (primary) hypertension: Secondary | ICD-10-CM | POA: Diagnosis not present

## 2024-03-18 DIAGNOSIS — E0500 Thyrotoxicosis with diffuse goiter without thyrotoxic crisis or storm: Secondary | ICD-10-CM

## 2024-03-18 DIAGNOSIS — F3170 Bipolar disorder, currently in remission, most recent episode unspecified: Secondary | ICD-10-CM

## 2024-03-18 DIAGNOSIS — E782 Mixed hyperlipidemia: Secondary | ICD-10-CM

## 2024-03-18 DIAGNOSIS — I4891 Unspecified atrial fibrillation: Secondary | ICD-10-CM | POA: Diagnosis not present

## 2024-03-18 MED ORDER — PROPRANOLOL HCL ER 60 MG PO CP24: 60.0000 mg | ORAL_CAPSULE | Freq: Every day | ORAL | Status: DC

## 2024-03-18 NOTE — Progress Notes (Signed)
 Bluford Burkitt, NP-C Phone: [phone]  Amy Mcconnell is a 73 y.o. female who presents today for transfer of care.   Discussed the use of AI scribe software for clinical note transcription with the patient, who gave verbal consent to proceed.  History of Present Illness   Amy Mcconnell is a 73 year old female with atrial fibrillation and Graves' disease who presents for a transfer of care.  She has atrial fibrillation managed with Eliquis  and has not experienced any episodes since an ER visit for chest pain. She continues to take Eliquis  regularly.  She has a history of coronary artery disease and recent imaging, including a PET scan and CT cardiac scan, revealed slight plaque in the artery. Her Lipitor dosage was increased from 20 mg to 40 mg. She does not experience muscle aches from the medication increase, attributing any soreness to standing while gardening.  Her Graves' disease is managed with methimazole, recently reduced from 10 mg to 5 mg. She reports no symptoms such as temperature intolerance or heart palpitations. She also takes propranolol  60 mg nightly, with additional 20 mg doses as needed for heart rates over 100 bpm.  She takes Lasix  three times a week and Cardura  twice daily, with an extra dose available for high blood pressure. She monitors her blood pressure at home but does not check it as frequently as recommended. No chest pain, shortness of breath, dizziness, or swelling.  She regularly sees a psychiatrist who manages several of her medications. Her previously injured finger has healed well with potential return of sensation.      Social History   Tobacco Use  Smoking Status Former   Current packs/day: 0.00   Average packs/day: 0.5 packs/day for 13.0 years (6.5 ttl pk-yrs)   Types: Cigarettes   Start date: 01/23/1987   Quit date: 01/23/2000   Years since quitting: 24.1  Smokeless Tobacco Never    Current Outpatient Medications on File Prior to Visit   Medication Sig Dispense Refill   apixaban  (ELIQUIS ) 5 MG TABS tablet Take 1 tablet (5 mg total) by mouth 2 (two) times daily. 180 tablet 3   atorvastatin  (LIPITOR) 40 MG tablet Take 1 tablet (40 mg total) by mouth daily. 90 tablet 3   B Complex Vitamins (VITAMIN B COMPLEX PO) Take 1 tablet by mouth daily.     Bacillus Coagulans-Inulin (ALIGN PREBIOTIC-PROBIOTIC PO) Take by mouth.     BOOSTRIX 5-2.5-18.5 LF-MCG/0.5 injection      calcium  carbonate (OS-CAL) 600 MG TABS tablet Take 600 mg by mouth daily.      Cholecalciferol (VITAMIN D3) 50 MCG (2000 UT) capsule Take 2,000 Units by mouth daily.     COVID-19 mRNA vaccine, Pfizer, (COMIRNATY ) syringe Inject into the muscle. 0.3 mL 0   diphenhydrAMINE  (BENADRYL ) 25 mg capsule Take 25 mg by mouth daily as needed for allergies.     docusate sodium  (COLACE) 100 MG capsule Take 100 mg by mouth 2 (two) times daily.     doxazosin  (CARDURA ) 2 MG tablet TAKE 1 TABLET BY MOUTH TWICE DAILY (MAY  TAKE  EXTRA  TABLET  AS  NEEDED  FOR  BP  GREATER  THAN  160) 180 tablet 3   EPINEPHrine  (EPIPEN  2-PAK) 0.3 mg/0.3 mL IJ SOAJ injection Inject 0.3 mg into the muscle as needed for anaphylaxis. 2 each 0   fexofenadine (ALLEGRA) 180 MG tablet Take 180 mg by mouth daily as needed for allergies.     Flaxseed, Linseed, (FLAXSEED OIL PO)  Take 1 capsule by mouth daily.     furosemide  (LASIX ) 20 MG tablet TAKE 1 TABLET BY MOUTH THREE TIMES A WEEK 12 tablet 3   lithium  carbonate (LITHOBID ) 300 MG ER tablet Take 2 tablets (600 mg total) by mouth at bedtime. 60 tablet 3   magnesium oxide (MAG-OX) 400 MG tablet Take 400 mg by mouth daily.     methimazole (TAPAZOLE) 5 MG tablet Take 5 mg by mouth.     Multiple Vitamins-Minerals (VISION FORMULA/LUTEIN) TABS Take 1 tablet by mouth in the morning and at bedtime.     Omega-3 Fatty Acids (FISH OIL) 1000 MG CAPS Take 3,000 mg by mouth daily.     Oxcarbazepine  (TRILEPTAL ) 300 MG tablet TAKE 1 TABLET BY MOUTH IN THE MORNING AND 2 AT  BEDTIME 90 tablet 5   potassium chloride  SA (KLOR-CON  M20) 20 MEQ tablet TAKE 1 TABLET BY MOUTH THREE TIMES A WEEK 12 tablet 3   propranolol  (INDERAL ) 20 MG tablet Take 1 tablet (20 mg total) by mouth 3 (three) times daily as needed. For heart rate greater then 100 270 tablet 3   vitamin B-12 (CYANOCOBALAMIN ) 1000 MCG tablet Take 1,000 mcg by mouth daily.     vitamin E  1000 UNIT capsule Take 1,000 Units by mouth daily.     zinc  gluconate 50 MG tablet Take 50 mg by mouth daily.     ziprasidone  (GEODON ) 20 MG capsule TAKE 1 CAPSULE BY MOUTH ONCE DAILY WITH SUPPER 30 capsule 1   No current facility-administered medications on file prior to visit.     ROS see history of present illness  Objective  Physical Exam Vitals:   03/18/24 0841  BP: 120/60  Pulse: (!) 52  Temp: 98.2 F (36.8 C)  SpO2: 98%    BP Readings from Last 3 Encounters:  03/18/24 120/60  02/12/24 138/84  02/04/24 (!) 155/85   Wt Readings from Last 3 Encounters:  03/18/24 134 lb 6.4 oz (61 kg)  02/12/24 134 lb 12.8 oz (61.1 kg)  01/22/24 134 lb 3.2 oz (60.9 kg)    Physical Exam Constitutional:      General: She is not in acute distress.    Appearance: Normal appearance.  HENT:     Head: Normocephalic.  Cardiovascular:     Rate and Rhythm: Normal rate and regular rhythm.     Heart sounds: Normal heart sounds.  Pulmonary:     Effort: Pulmonary effort is normal.     Breath sounds: Normal breath sounds.  Skin:    General: Skin is warm and dry.  Neurological:     General: No focal deficit present.     Mental Status: She is alert.  Psychiatric:        Mood and Affect: Mood normal.        Behavior: Behavior normal.      Assessment/Plan: Please see individual problem list.  Atrial fibrillation, unspecified type [facility]) Assessment & Plan: Atrial fibrillation is well-controlled with no recent episodes. Palpitations are attributed to Graves' disease. Continue Eliquis  as prescribed. Propranolol  effectively  controls her heart rate. Continue propranolol  60 mg daily, with an additional 20 mg as needed for heart rate control. Follow up with Cardiology as scheduled.   Orders: -     Propranolol  HCl ER; Take 1 capsule (60 mg total) by mouth daily.  Graves' disease without crisis Assessment & Plan: Graves' disease is well-managed with methimazole, and she reports no symptoms. Continue methimazole at 5 mg as prescribed and follow  up with Endocrinology as scheduled. TSH recently checked- WNL.    Mixed hyperlipidemia Assessment & Plan: Hyperlipidemia is managed with an increased Lipitor dose, and she reports no muscle aches. Cardiology is monitoring lipid levels and hepatic function. Continue Lipitor at 40 mg as prescribed. Follow up with cardiology for lipid panel and hepatic function tests on May 21.    Essential hypertension Assessment & Plan: Blood pressure is well controlled on current medication regimen. Continue Propranolol  60 mg daily, Cardura  2 mg twice daily, and Lasix  20 mg three times per week. Follow up with Cardiology as scheduled. Kidney function recently checked by Psych - WNL.   Orders: -     Propranolol  HCl ER; Take 1 capsule (60 mg total) by mouth daily.  Bipolar disorder in full remission, most recent episode unspecified type [facility]) Assessment & Plan: Stable on current medication regimen. Continue current medications and follow up with Psychiatry as scheduled.       Return in about 6 months (around 09/18/2024) for Follow up.   Bluford Burkitt, NP-C [facility] - [facility]

## 2024-03-18 NOTE — Assessment & Plan Note (Addendum)
 Blood pressure is well controlled on current medication regimen. Continue Propranolol  60 mg daily, Cardura  2 mg twice daily, and Lasix  20 mg three times per week. Follow up with Cardiology as scheduled. Kidney function recently checked by Psych - WNL.

## 2024-03-18 NOTE — Assessment & Plan Note (Signed)
 Hyperlipidemia is managed with an increased Lipitor dose, and she reports no muscle aches. Cardiology is monitoring lipid levels and hepatic function. Continue Lipitor at 40 mg as prescribed. Follow up with cardiology for lipid panel and hepatic function tests on May 21.

## 2024-03-18 NOTE — Assessment & Plan Note (Signed)
 Stable on current medication regimen. Continue current medications and follow up with Psychiatry as scheduled.

## 2024-03-18 NOTE — Assessment & Plan Note (Signed)
 Atrial fibrillation is well-controlled with no recent episodes. Palpitations are attributed to Graves' disease. Continue Eliquis  as prescribed. Propranolol  effectively controls her heart rate. Continue propranolol  60 mg daily, with an additional 20 mg as needed for heart rate control. Follow up with Cardiology as scheduled.

## 2024-03-18 NOTE — Assessment & Plan Note (Addendum)
 Graves' disease is well-managed with methimazole, and she reports no symptoms. Continue methimazole at 5 mg as prescribed and follow up with Endocrinology as scheduled. TSH recently checked- WNL.

## 2024-03-29 ENCOUNTER — Other Ambulatory Visit: Payer: Self-pay | Admitting: Psychiatry

## 2024-03-29 DIAGNOSIS — F3176 Bipolar disorder, in full remission, most recent episode depressed: Secondary | ICD-10-CM

## 2024-04-01 ENCOUNTER — Other Ambulatory Visit: Payer: Self-pay

## 2024-04-01 DIAGNOSIS — E785 Hyperlipidemia, unspecified: Secondary | ICD-10-CM

## 2024-04-01 DIAGNOSIS — I480 Paroxysmal atrial fibrillation: Secondary | ICD-10-CM | POA: Diagnosis not present

## 2024-04-02 ENCOUNTER — Ambulatory Visit: Payer: Self-pay | Admitting: Physician Assistant

## 2024-04-02 LAB — HEPATIC FUNCTION PANEL
ALT: 38 IU/L — ABNORMAL HIGH (ref 0–32)
AST: 34 IU/L (ref 0–40)
Albumin: 4.7 g/dL (ref 3.8–4.8)
Alkaline Phosphatase: 199 IU/L — ABNORMAL HIGH (ref 44–121)
Bilirubin Total: 0.4 mg/dL (ref 0.0–1.2)
Bilirubin, Direct: 0.15 mg/dL (ref 0.00–0.40)
Total Protein: 7.2 g/dL (ref 6.0–8.5)

## 2024-04-02 LAB — LIPID PANEL
Chol/HDL Ratio: 2.6 ratio (ref 0.0–4.4)
Cholesterol, Total: 180 mg/dL (ref 100–199)
HDL: 68 mg/dL (ref >39)
LDL Chol Calc (NIH): 91 mg/dL (ref 0–99)
Triglycerides: 118 mg/dL (ref 0–149)
VLDL Cholesterol Cal: 21 mg/dL (ref 5–40)

## 2024-04-08 ENCOUNTER — Encounter: Payer: Self-pay | Admitting: Nurse Practitioner

## 2024-04-08 ENCOUNTER — Ambulatory Visit (INDEPENDENT_AMBULATORY_CARE_PROVIDER_SITE_OTHER): Admitting: Nurse Practitioner

## 2024-04-08 VITALS — BP 128/80 | HR 53 | Temp 98.4°F | Ht 67.0 in | Wt 131.8 lb

## 2024-04-08 DIAGNOSIS — R748 Abnormal levels of other serum enzymes: Secondary | ICD-10-CM | POA: Insufficient documentation

## 2024-04-08 DIAGNOSIS — E0500 Thyrotoxicosis with diffuse goiter without thyrotoxic crisis or storm: Secondary | ICD-10-CM | POA: Diagnosis not present

## 2024-04-08 NOTE — Progress Notes (Signed)
 Bluford Burkitt, NP-C Phone: [phone]  Amy Mcconnell is a 73 y.o. female who presents today for elevated alk phos level.   Discussed the use of AI scribe software for clinical note transcription with the patient, who gave verbal consent to proceed.  History of Present Illness   Amy Mcconnell is a 73 year old female with hyperthyroidism who presents with elevated alkaline phosphatase levels.  She has noted elevated alkaline phosphatase levels in her recent lab work. Previously, in March, her levels were significantly higher at around 250 but have since decreased to 199, although they remain elevated. She wonders if her consumption of dark chocolate processed with alkali prior to the test might have influenced the results.  She has a history of hyperthyroidism and is currently on methimazole, which was recently adjusted from 10 mg to 5 mg. Her hyperthyroidism was uncontrolled for a long period, which might have contributed to the elevated alkaline phosphatase levels. She last saw her endocrinologist about a month and a half ago and is due for more blood tests soon.  In 2022, an abdominal ultrasound revealed gallbladder wall thickening, but she has not experienced any symptoms such as right upper quadrant pain, nausea, or jaundice. Her AST and ALT have been essentially normal, and she is not experiencing any bone or joint pain. 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      Social History   Tobacco Use  Smoking Status Former   Current packs/day: 0.00   Average packs/day: 0.5 packs/day for 13.0 years (6.5 ttl pk-yrs)   Types: Cigarettes   Start date: 01/23/1987   Quit date: 01/23/2000   Years since quitting: 24.2  Smokeless Tobacco Never    Current Outpatient Medications on File Prior to Visit  Medication Sig Dispense Refill   apixaban  (ELIQUIS ) 5 MG TABS tablet  Take 1 tablet (5 mg total) by mouth 2 (two) times daily. 180 tablet 3   atorvastatin  (LIPITOR) 40 MG tablet Take 1 tablet (40 mg total) by mouth daily. 90 tablet 3   B Complex Vitamins (VITAMIN B COMPLEX PO) Take 1 tablet by mouth daily.     Bacillus Coagulans-Inulin (ALIGN PREBIOTIC-PROBIOTIC PO) Take by mouth.     BOOSTRIX 5-2.5-18.5 LF-MCG/0.5 injection      calcium  carbonate (OS-CAL) 600 MG TABS tablet Take 600 mg by mouth daily.      Cholecalciferol (VITAMIN D3) 50 MCG (2000 UT) capsule Take 2,000 Units by mouth daily.     COVID-19 mRNA vaccine, Pfizer, (COMIRNATY ) syringe Inject into the muscle. 0.3 mL 0   diphenhydrAMINE  (BENADRYL ) 25 mg capsule Take 25 mg by mouth daily as needed for allergies.     docusate sodium  (COLACE) 100 MG capsule Take 100 mg by mouth 2 (two) times daily.     doxazosin  (CARDURA ) 2 MG tablet TAKE 1 TABLET BY MOUTH TWICE DAILY (MAY  TAKE  EXTRA  TABLET  AS  NEEDED  FOR  BP  GREATER  THAN  160) 180 tablet 3   EPINEPHrine  (EPIPEN  2-PAK) 0.3 mg/0.3 mL IJ SOAJ injection Inject 0.3 mg into the muscle as needed for anaphylaxis. 2 each 0   fexofenadine (ALLEGRA) 180 MG tablet Take 180 mg by mouth daily as needed for allergies.     Flaxseed, Linseed, (FLAXSEED OIL PO) Take 1 capsule by mouth daily.     furosemide  (LASIX ) 20 MG tablet  TAKE 1 TABLET BY MOUTH THREE TIMES A WEEK 12 tablet 3   lithium  carbonate (LITHOBID ) 300 MG ER tablet Take 2 tablets (600 mg total) by mouth at bedtime. 60 tablet 3   magnesium oxide (MAG-OX) 400 MG tablet Take 400 mg by mouth daily.     methimazole (TAPAZOLE) 5 MG tablet Take 5 mg by mouth.     Multiple Vitamins-Minerals (VISION FORMULA/LUTEIN) TABS Take 1 tablet by mouth in the morning and at bedtime.     Omega-3 Fatty Acids (FISH OIL) 1000 MG CAPS Take 3,000 mg by mouth daily.     Oxcarbazepine  (TRILEPTAL ) 300 MG tablet TAKE 1 TABLET BY MOUTH IN THE MORNING AND 2 AT BEDTIME 90 tablet 5   potassium chloride  SA (KLOR-CON  M20) 20 MEQ tablet  TAKE 1 TABLET BY MOUTH THREE TIMES A WEEK 12 tablet 3   propranolol  (INDERAL ) 20 MG tablet Take 1 tablet (20 mg total) by mouth 3 (three) times daily as needed. For heart rate greater then 100 270 tablet 3   propranolol  ER (INDERAL  LA) 60 MG 24 hr capsule Take 1 capsule (60 mg total) by mouth daily.     vitamin B-12 (CYANOCOBALAMIN ) 1000 MCG tablet Take 1,000 mcg by mouth daily.     vitamin E  1000 UNIT capsule Take 1,000 Units by mouth daily.     zinc  gluconate 50 MG tablet Take 50 mg by mouth daily.     ziprasidone  (GEODON ) 20 MG capsule TAKE 1 CAPSULE BY MOUTH ONCE DAILY WITH SUPPER 30 capsule 5   No current facility-administered medications on file prior to visit.     ROS see history of present illness  Objective  Physical Exam Vitals:   04/08/24 1327  BP: 128/80  Pulse: (!) 53  Temp: 98.4 F (36.9 C)  SpO2: 94%    BP Readings from Last 3 Encounters:  04/08/24 128/80  03/18/24 120/60  02/12/24 138/84   Wt Readings from Last 3 Encounters:  04/08/24 131 lb 12.8 oz (59.8 kg)  03/18/24 134 lb 6.4 oz (61 kg)  02/12/24 134 lb 12.8 oz (61.1 kg)    Physical Exam Constitutional:      General: She is not in acute distress.    Appearance: Normal appearance.  HENT:     Head: Normocephalic.  Cardiovascular:     Rate and Rhythm: Normal rate and regular rhythm.     Heart sounds: Normal heart sounds.  Pulmonary:     Effort: Pulmonary effort is normal.     Breath sounds: Normal breath sounds.  Abdominal:     General: Abdomen is flat. Bowel sounds are normal.     Palpations: Abdomen is soft.     Tenderness: There is no abdominal tenderness.  Skin:    General: Skin is warm and dry.  Neurological:     General: No focal deficit present.     Mental Status: She is alert.  Psychiatric:        Mood and Affect: Mood normal.        Behavior: Behavior normal.      Assessment/Plan: Please see individual problem list.  Elevated alkaline phosphatase level Assessment &  Plan: Alkaline phosphatase remains elevated despite a decrease. ALT mildly elevated. AST is normal. Hyperthyroidism could be contributing. Order an ultrasound of the liver and gallbladder for further evaluation and to rule out obstruction. Check alkaline phosphatase isoenzymes, Hep C antibody, hepatic function panel and parathyroid hormone (PTH) level. Monitor thyroid  function tests and coordinate with endocrinology for  thyroid  management. Further work up pending results.   Orders: -     Alkaline phosphatase, isoenzymes -     PTH, intact and calcium  -     Hepatitis C antibody -     US  ABDOMEN LIMITED RUQ (LIVER/GB); Future -     Hepatic function panel  Graves' disease without crisis Assessment & Plan: Hyperthyroidism is uncontrolled and may contribute to elevated alkaline phosphatase. She is under endocrinology care with follow-up labs scheduled in the next 2 weeks. Monitor thyroid  function tests and coordinate with endocrinology for thyroid  management. She will continue methimazole 5 mg as prescribed.      Return for as scheduled.   Bluford Burkitt, NP-C [facility] - [facility]

## 2024-04-08 NOTE — Assessment & Plan Note (Signed)
 Alkaline phosphatase remains elevated despite a decrease. ALT mildly elevated. AST is normal. Hyperthyroidism could be contributing. Order an ultrasound of the liver and gallbladder for further evaluation and to rule out obstruction. Check alkaline phosphatase isoenzymes, Hep C antibody, hepatic function panel and parathyroid hormone (PTH) level. Monitor thyroid  function tests and coordinate with endocrinology for thyroid  management. Further work up pending results.

## 2024-04-08 NOTE — Assessment & Plan Note (Addendum)
 Hyperthyroidism is uncontrolled and may contribute to elevated alkaline phosphatase. She is under endocrinology care with follow-up labs scheduled in the next 2 weeks. Monitor thyroid  function tests and coordinate with endocrinology for thyroid  management. She will continue methimazole 5 mg as prescribed.

## 2024-04-09 LAB — HEPATIC FUNCTION PANEL
ALT: 27 U/L (ref 0–35)
AST: 26 U/L (ref 0–37)
Albumin: 4.9 g/dL (ref 3.5–5.2)
Alkaline Phosphatase: 154 U/L — ABNORMAL HIGH (ref 39–117)
Bilirubin, Direct: 0.1 mg/dL (ref 0.0–0.3)
Total Bilirubin: 0.4 mg/dL (ref 0.2–1.2)
Total Protein: 8 g/dL (ref 6.0–8.3)

## 2024-04-09 LAB — PTH, INTACT AND CALCIUM
Calcium: 10.1 mg/dL (ref 8.6–10.4)
PTH: 25 pg/mL (ref 16–77)

## 2024-04-09 LAB — HEPATITIS C ANTIBODY: Hepatitis C Ab: NONREACTIVE

## 2024-04-12 LAB — ALKALINE PHOSPHATASE, ISOENZYMES
Alkaline Phosphatase: 195 IU/L — ABNORMAL HIGH (ref 44–121)
BONE FRACTION: 74 % — ABNORMAL HIGH (ref 14–68)
INTESTINAL FRAC.: 2 % (ref 0–18)
LIVER FRACTION: 24 % (ref 18–85)

## 2024-04-15 ENCOUNTER — Telehealth: Payer: Self-pay

## 2024-04-15 NOTE — Telephone Encounter (Signed)
 Copied from CRM [id number]. Topic: Clinical - Lab/Test Results >> Apr 15, 2024  4:27 PM Luane Rumps D wrote: Reason for CRM: Patient would like to know the results of her lab work. Patient would like to advise that she is having an ultrasound Friday morning.

## 2024-04-16 NOTE — Telephone Encounter (Signed)
 Pt calling in regards to labs done on 04-08-24

## 2024-04-17 ENCOUNTER — Ambulatory Visit
Admission: RE | Admit: 2024-04-17 | Discharge: 2024-04-17 | Disposition: A | Discharge status: Home / Self Care | Source: Ambulatory Visit | Attending: Nurse Practitioner | Admitting: Nurse Practitioner

## 2024-04-17 DIAGNOSIS — R748 Abnormal levels of other serum enzymes: Secondary | ICD-10-CM | POA: Diagnosis not present

## 2024-04-17 NOTE — Telephone Encounter (Signed)
 New Message:     Patient said she was told she could not eat butter or cheese. After her recent lab work, she wants to know if she still can not have cheese and butter?

## 2024-04-22 ENCOUNTER — Ambulatory Visit: Payer: Self-pay | Admitting: Nurse Practitioner

## 2024-04-29 DIAGNOSIS — E0590 Thyrotoxicosis, unspecified without thyrotoxic crisis or storm: Secondary | ICD-10-CM | POA: Diagnosis not present

## 2024-05-01 DIAGNOSIS — D6869 Other thrombophilia: Secondary | ICD-10-CM | POA: Diagnosis not present

## 2024-05-01 DIAGNOSIS — J309 Allergic rhinitis, unspecified: Secondary | ICD-10-CM | POA: Diagnosis not present

## 2024-05-16 ENCOUNTER — Other Ambulatory Visit: Payer: Self-pay | Admitting: Cardiovascular Disease

## 2024-05-18 ENCOUNTER — Telehealth: Payer: Self-pay | Admitting: Cardiovascular Disease

## 2024-05-18 MED ORDER — POTASSIUM CHLORIDE CRYS ER 20 MEQ PO TBCR: 20.0000 meq | EXTENDED_RELEASE_TABLET | Freq: Every day | ORAL | 2 refills | Status: DC

## 2024-05-18 NOTE — Telephone Encounter (Signed)
 RX sent to requested Pharmacy

## 2024-05-18 NOTE — Telephone Encounter (Signed)
*  STAT* If patient is at the pharmacy, call can be transferred to refill team.   1. Which medications need to be refilled? (please list name of each medication and dose if known) furosemide  20, K LOR - CON M20 20 mg   2. Would you like to learn more about the convenience, safety, & potential cost savings by using the [facility]? no   3. Are you open to using the [facility] (Type [facility].no ).no   4. Which pharmacy/location (including street and city if local pharmacy) is medication to be sent to? [facility], [address] [place]   5. Do they need a 30 day or 90 day supply? 90

## 2024-06-29 ENCOUNTER — Other Ambulatory Visit (HOSPITAL_COMMUNITY): Payer: Self-pay | Admitting: Psychiatry

## 2024-06-29 ENCOUNTER — Other Ambulatory Visit: Payer: Self-pay | Admitting: Psychiatry

## 2024-06-29 DIAGNOSIS — F3176 Bipolar disorder, in full remission, most recent episode depressed: Secondary | ICD-10-CM

## 2024-07-01 DIAGNOSIS — E0590 Thyrotoxicosis, unspecified without thyrotoxic crisis or storm: Secondary | ICD-10-CM | POA: Diagnosis not present

## 2024-07-05 ENCOUNTER — Other Ambulatory Visit: Payer: Self-pay | Admitting: Cardiovascular Disease

## 2024-07-07 NOTE — Telephone Encounter (Signed)
 Prescription refill request for Eliquis  received. Indication:afib Last office visit:3/25 Scr:0.86  2025 Age: 73 Weight:59.8  kg  Prescription refilled

## 2024-07-10 DIAGNOSIS — E0500 Thyrotoxicosis with diffuse goiter without thyrotoxic crisis or storm: Secondary | ICD-10-CM | POA: Diagnosis not present

## 2024-07-13 ENCOUNTER — Telehealth: Payer: Self-pay | Admitting: Cardiovascular Disease

## 2024-07-13 NOTE — Telephone Encounter (Signed)
 Pt requesting that lab order be put in to check her cholesterol before her 9/26 appt.  She would like a c/b regarding this matter. Please advise

## 2024-07-13 NOTE — Telephone Encounter (Signed)
 Called and spoke with patient. Patient says that she her cholesterol was elevated. Patient has modified her diet and would like to recheck her Lipid Panel prior to her upcoming appointment with Dr. Gollan.

## 2024-07-20 ENCOUNTER — Other Ambulatory Visit: Payer: Self-pay | Admitting: Emergency Medicine

## 2024-07-20 DIAGNOSIS — E785 Hyperlipidemia, unspecified: Secondary | ICD-10-CM

## 2024-07-20 DIAGNOSIS — E782 Mixed hyperlipidemia: Secondary | ICD-10-CM

## 2024-07-20 NOTE — Telephone Encounter (Signed)
 Called patient and left detailed message per DPR. Left message with the following from Dr. Gollan.  We can put in a lipid panel 1 was done 5 months ago  Also 3 months ago,  if they are drawn to close together there is a risk insurance may not cover and she may have to pay out-of-pocket Thx TGollan  Asked patient to call back with any questions.

## 2024-07-22 LAB — LIPID PANEL
Chol/HDL Ratio: 2.3 ratio (ref 0.0–4.4)
Cholesterol, Total: 179 mg/dL (ref 100–199)
HDL: 78 mg/dL (ref >39)
LDL Chol Calc (NIH): 84 mg/dL (ref 0–99)
Triglycerides: 96 mg/dL (ref 0–149)
VLDL Cholesterol Cal: 17 mg/dL (ref 5–40)

## 2024-07-25 ENCOUNTER — Ambulatory Visit: Payer: Self-pay | Admitting: Cardiovascular Disease

## 2024-07-27 ENCOUNTER — Other Ambulatory Visit: Payer: Self-pay | Admitting: Nurse Practitioner

## 2024-07-27 ENCOUNTER — Other Ambulatory Visit: Payer: Self-pay | Admitting: Psychiatry

## 2024-07-27 DIAGNOSIS — F3176 Bipolar disorder, in full remission, most recent episode depressed: Secondary | ICD-10-CM

## 2024-07-27 DIAGNOSIS — Z1231 Encounter for screening mammogram for malignant neoplasm of breast: Secondary | ICD-10-CM

## 2024-07-29 DIAGNOSIS — K080 Exfoliation of teeth due to systemic causes: Secondary | ICD-10-CM | POA: Diagnosis not present
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
## 2024-07-31 ENCOUNTER — Ambulatory Visit: Attending: Cardiovascular Disease | Admitting: Cardiovascular Disease

## 2024-07-31 ENCOUNTER — Encounter: Payer: Self-pay | Admitting: Cardiovascular Disease

## 2024-07-31 VITALS — BP 110/68 | HR 50 | Ht 67.0 in | Wt 134.4 lb

## 2024-07-31 DIAGNOSIS — R079 Chest pain, unspecified: Secondary | ICD-10-CM | POA: Diagnosis not present

## 2024-07-31 DIAGNOSIS — E785 Hyperlipidemia, unspecified: Secondary | ICD-10-CM | POA: Diagnosis not present

## 2024-07-31 DIAGNOSIS — I2510 Atherosclerotic heart disease of native coronary artery without angina pectoris: Secondary | ICD-10-CM | POA: Diagnosis not present

## 2024-07-31 DIAGNOSIS — Z79899 Other long term (current) drug therapy: Secondary | ICD-10-CM | POA: Diagnosis not present

## 2024-07-31 DIAGNOSIS — I10 Essential (primary) hypertension: Secondary | ICD-10-CM

## 2024-07-31 DIAGNOSIS — I480 Paroxysmal atrial fibrillation: Secondary | ICD-10-CM

## 2024-07-31 DIAGNOSIS — I479 Paroxysmal tachycardia, unspecified: Secondary | ICD-10-CM

## 2024-07-31 MED ORDER — DOXAZOSIN MESYLATE 2 MG PO TABS: ORAL_TABLET | ORAL | 3 refills | Status: AC

## 2024-07-31 NOTE — Patient Instructions (Addendum)
Medication Instructions:  No changes  If you need a refill on your cardiac medications before your next appointment, please call your pharmacy.   Lab work: [employer] today  Testing/Procedures: No new testing needed  Follow-Up: At [facility], you and your health needs are our priority.  As part of our continuing mission to provide you with exceptional heart care, we have created designated Provider Care Teams.  These Care Teams include your primary Cardiologist (physician) and Advanced Practice Providers (APPs -  Physician Assistants and Nurse Practitioners) who all work together to provide you with the care you need, when you need it.  You will need a follow up appointment in 12 months  Providers on your designated Care Team:   Murray Hodgkins, NP Christell Faith, PA-C Cadence Kathlen Mody, [place]  COVID-19 Vaccine Information can be found at: [email or web address] For questions related to vaccine distribution or appointments, please email [email or web address] or call [phone].

## 2024-08-01 LAB — BASIC METABOLIC PANEL WITH GFR
BUN/Creatinine Ratio: 26 (ref 12–28)
BUN: 23 mg/dL (ref 8–27)
CO2: 21 mmol/L (ref 20–29)
Calcium: 10 mg/dL (ref 8.7–10.3)
Chloride: 104 mmol/L (ref 96–106)
Creatinine, Ser: 0.9 mg/dL (ref 0.57–1.00)
Glucose: 85 mg/dL (ref 70–99)
Potassium: 4.6 mmol/L (ref 3.5–5.2)
Sodium: 141 mmol/L (ref 134–144)
eGFR: 68 mL/min/1.73 (ref >59)

## 2024-08-09 ENCOUNTER — Other Ambulatory Visit: Payer: Self-pay | Admitting: Cardiovascular Disease

## 2024-08-09 DIAGNOSIS — I10 Essential (primary) hypertension: Secondary | ICD-10-CM

## 2024-08-09 DIAGNOSIS — I4891 Unspecified atrial fibrillation: Secondary | ICD-10-CM

## 2024-08-10 ENCOUNTER — Ambulatory Visit: Payer: Self-pay | Admitting: Cardiovascular Disease

## 2024-08-10 ENCOUNTER — Encounter: Payer: Self-pay | Admitting: Cardiovascular Disease

## 2024-08-12 ENCOUNTER — Ambulatory Visit (INDEPENDENT_AMBULATORY_CARE_PROVIDER_SITE_OTHER): Admitting: Psychiatry

## 2024-08-12 ENCOUNTER — Encounter: Payer: Self-pay | Admitting: Psychiatry

## 2024-08-12 ENCOUNTER — Other Ambulatory Visit: Payer: Self-pay

## 2024-08-12 VITALS — BP 122/81 | HR 65 | Temp 96.9°F | Ht 67.0 in | Wt 134.4 lb

## 2024-08-12 DIAGNOSIS — F5101 Primary insomnia: Secondary | ICD-10-CM

## 2024-08-12 DIAGNOSIS — F3176 Bipolar disorder, in full remission, most recent episode depressed: Secondary | ICD-10-CM | POA: Diagnosis not present

## 2024-08-12 DIAGNOSIS — Z79899 Other long term (current) drug therapy: Secondary | ICD-10-CM

## 2024-08-12 NOTE — Progress Notes (Signed)
 BH MD OP Progress Note  08/12/2024 10:56 AM Amy Mcconnell  MRN:  [id number]  Chief Complaint:  Chief Complaint  Patient presents with   Follow-up   Anxiety   Depression   Medication Refill   Discussed the use of AI scribe software for clinical note transcription with the patient, who gave verbal consent to proceed.  History of Present Illness Amy Mcconnell is a 73 year old Caucasian female who is currently divorced, lives in [place], has a history of bipolar disorder, primary insomnia, essential hypertension, tachycardia, hyperlipidemia, heart murmur, thyroid  abnormalities was evaluated in office today for a follow-up appointment.  Frustration and aggravation related to various aspects of daily life, especially dietary restrictions and medication side effects, impact her enjoyment of eating and contribute to her overall sense of aggravation. She reports that being unable to enjoy certain foods and feeling limited by her current health regimen have affected her quality of life.  She links her sleep disturbances to her Graves disease and reports waking as early as 4 a.m. She often uses this time to clean the kitchen or sort mail and sometimes returns to sleep afterward.  Overall she believes she has been sleeping okay.  She does not express concerns about memory and stays mentally active by reading and watching television.  Her current medication regimen includes Geodon  20 mg with supper daily, which she does not associate with muscle spasms, rigidity, tremors, or stiffness. She also takes Trileptal  and notes experiencing a total body muscle jerk a few hours after taking it, a symptom she identifies as longstanding and not concerning. She expresses concern about nearly running out of lithium  due to pharmacy supply issues but confirms she has enough to last until her next refill. She reports adherence to her medication regimen and actively monitors her supply to avoid interruptions.  She  denies any significant depression, anxiety or mood swings.  Denies any manic or hypomanic symptoms.  Denies any suicidality, homicidality or perceptual disturbances.  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 She lives in [place]. She reports that regular activity at work often results in more than 16,000 steps per shift. She enjoys gardening, including planting bulbs and maintaining houseplants such as orchids. 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 She spends free time reading, watching TV, and staying socially engaged through church activities.    Visit Diagnosis:    ICD-10-CM   1. Bipolar disorder, in full remission, most recent episode depressed  F31.76     2. Primary insomnia  F51.01     3. High risk medication use  Z79.899 Lithium  level      Past Psychiatric History: Reviewed past psychiatric history from progress note on 01/04/2022.  Past trials of medications-multiple.  Reports a history of suicidal ideation and an attempt to walk into the ocean several years ago.  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 Bernardino - every 3 months   Broken foot    left    Contact dermatitis and other eczema due to other specified agent    Contusion of unspecified part of lower limb    Essential hypertension, benign    Graves disease    Heart murmur  New onset a-fib (HCC) 08/05/2023   Other and unspecified hyperlipidemia    Other specified disorders of thyroid     Reflux esophagitis    Scalp laceration 04/13/2019   Tachycardia    a. isolated episode, seen in ED with negative work up    Past Surgical History:  Procedure Laterality Date   CATARACT EXTRACTION      COLONOSCOPY WITH PROPOFOL  N/A 03/27/2022   Procedure: COLONOSCOPY WITH PROPOFOL ;  Surgeon: Jinny Carmine, MD;  Location: [facility] ENDOSCOPY;  Service: Endoscopy;  Laterality: N/A;   hamertoe     HAMMER TOE SURGERY  11/30/2021   right 4th toe 11/10/21 Dr. Silva   SALPINGECTOMY Left    STERILIZATION     Her decision since dz of bipolar    Family Psychiatric History: I have reviewed family psychiatric history from progress note on 01/04/2022.  Family History:  Family History  Problem Relation Age of Onset   Arrhythmia Mother        A-Fib   Heart failure Mother    Hyperlipidemia Mother    Hypertension Mother    Alzheimer's disease Mother    Hypertension Father    Hyperlipidemia Father    Mental retardation Father 54       Suicide   Hypertension Brother    Hyperlipidemia Brother    Hypertension Brother    Hyperlipidemia Brother    Asthma Brother    COPD Brother    Hyperlipidemia Brother    Hypertension Brother    Alcohol abuse Brother    Depression Brother    Emphysema Brother    Hyperlipidemia Brother    Hypertension Brother    Breast cancer Maternal Aunt    Bipolar disorder Paternal Uncle    Bipolar disorder Paternal Uncle    Bipolar disorder Paternal Uncle    Bipolar disorder Paternal Grandmother     Social History: I have reviewed social history from progress note on 01/04/2022. Social History   Socioeconomic History   Marital status: Divorced    Spouse name: Not on file   Number of children: 0   Years of education: 14   Highest education level: Not on file  Occupational History   Not on file  Tobacco Use   Smoking status: Former    Current packs/day: 0.00    Average packs/day: 0.5 packs/day for 13.0 years (6.5 ttl pk-yrs)    Types: Cigarettes    Start date: 01/23/1987    Quit date: 01/23/2000    Years since quitting: 24.5   Smokeless tobacco: Never  Vaping Use   Vaping status: Never Used  Substance and Sexual Activity   Alcohol use: No   Drug use: No    Sexual activity: Not Currently  Other Topics Concern   Not on file  Social History Narrative   [name] grew up in [place], [place]. She lives in [place]. [name] is divorced. She has 2 cats (Charlie and Angie). She volunteers at [facility] [employer] flea [employer]. She also volunteers 2 days at [facility]. She enjoys reading, garding and cooking.   Social Drivers of Corporate investment banker Strain: Low Risk  (11/20/2023)   Overall Financial Resource Strain (CARDIA)    Difficulty of Paying Living Expenses: Not hard at all  Food Insecurity: No Food Insecurity (11/20/2023)   Hunger Vital Sign    Worried About Running Out of Food in the Last Year: Never true    Ran Out of Food in the Last Year: Never true  Transportation  Needs: No Transportation Needs (11/20/2023)   PRAPARE - Administrator, Civil Service (Medical): No    Lack of Transportation (Non-Medical): No  Physical Activity: Sufficiently Active (11/20/2023)   Exercise Vital Sign    Days of Exercise per Week: 3 days    Minutes of Exercise per Session: 60 min  Stress: No Stress Concern Present (11/20/2023)   [employer] of Occupational Health - Occupational Stress Questionnaire    Feeling of Stress : Only a little  Social Connections: Moderately Integrated (11/20/2023)   Social Connection and Isolation Panel    Frequency of Communication with Friends and Family: More than three times a week    Frequency of Social Gatherings with Friends and Family: More than three times a week    Attends Religious Services: More than 4 times per year    Active Member of [employer] or Organizations: Yes    Attends Engineer, structural: More than 4 times per year    Marital Status: Divorced    Allergies:  Allergies  Allergen Reactions   Apple Juice Anaphylaxis   Other Other (See Comments)    Certain food and fragrances , certain cleaners , has allergic response inside her mouth , like tongue swelling and also has bloating  from it   Ace Inhibitors     Angioedema    Angiotensin Receptor Blockers     Angioedema    Benicar [Olmesartan]     Mouth & Lip Swelling, I swell from the waist down. Throat swelling   Beta Adrenergic Blockers Swelling   Poison Ivy Extract [Poison Ivy Extract]     Blisters   Purell Instant Hand [Alcohol] Swelling   Triclosan     Metabolic Disorder Labs: Lab Results  Component Value Date   HGBA1C 5.5 07/05/2023   MPG 96.8 11/27/2022   Lab Results  Component Value Date   PROLACTIN 17.5 03/28/2023   PROLACTIN 26.1 (H) 03/07/2023   Lab Results  Component Value Date   CHOL 179 07/21/2024   TRIG 96 07/21/2024   HDL 78 07/21/2024   CHOLHDL 2.3 07/21/2024   VLDL 13 07/21/2022   LDLCALC 84 07/21/2024   LDLCALC 91 04/01/2024   Lab Results  Component Value Date   TSH <0.010 (L) 08/13/2023   TSH <0.010 (L) 08/05/2023    Therapeutic Level Labs: Lab Results  Component Value Date   LITHIUM  0.61 03/12/2024   LITHIUM  0.63 12/05/2023   No results found for: VALPROATE No results found for: CBMZ  Current Medications: Current Outpatient Medications  Medication Sig Dispense Refill   apixaban  (ELIQUIS ) 5 MG TABS tablet Take 1 tablet by mouth twice daily 180 tablet 1   atorvastatin  (LIPITOR) 40 MG tablet Take 1 tablet (40 mg total) by mouth daily. 90 tablet 3   B Complex Vitamins (VITAMIN B COMPLEX PO) Take 1 tablet by mouth daily.     Bacillus Coagulans-Inulin (ALIGN PREBIOTIC-PROBIOTIC PO) Take by mouth.     BOOSTRIX 5-2.5-18.5 LF-MCG/0.5 injection      calcium  carbonate (OS-CAL) 600 MG TABS tablet Take 600 mg by mouth daily.      Cholecalciferol (VITAMIN D3) 50 MCG (2000 UT) capsule Take 2,000 Units by mouth daily.     COVID-19 mRNA vaccine, Pfizer, (COMIRNATY ) syringe Inject into the muscle. 0.3 mL 0   diphenhydrAMINE  (BENADRYL ) 25 mg capsule Take 25 mg by mouth daily as needed for allergies.     docusate sodium  (COLACE) 100 MG capsule Take 100 mg by mouth 2 (  two)  times daily.     doxazosin  (CARDURA ) 2 MG tablet TAKE 1 TABLET BY MOUTH TWICE DAILY (MAY  TAKE  EXTRA  TABLET  AS  NEEDED  FOR  BP  GREATER  THAN  160) 180 tablet 3   EPINEPHrine  (EPIPEN  2-PAK) 0.3 mg/0.3 mL IJ SOAJ injection Inject 0.3 mg into the muscle as needed for anaphylaxis. 2 each 0   fexofenadine (ALLEGRA) 180 MG tablet Take 180 mg by mouth daily as needed for allergies.     Flaxseed, Linseed, (FLAXSEED OIL PO) Take 1 capsule by mouth daily.     furosemide  (LASIX ) 20 MG tablet TAKE 1 TABLET BY MOUTH THREE TIMES A WEEK 36 tablet 2   Lactobacillus-Inulin (CULTURELLE DIGESTIVE DAILY PRO) CAPS Take by mouth daily.     lithium  carbonate (LITHOBID ) 300 MG ER tablet TAKE 2 TABLETS BY MOUTH AT BEDTIME 60 tablet 1   magnesium oxide (MAG-OX) 400 MG tablet Take 400 mg by mouth daily.     methimazole (TAPAZOLE) 5 MG tablet Take 2.5 mg by mouth once.     Multiple Vitamins-Minerals (VISION FORMULA/LUTEIN) TABS Take 1 tablet by mouth in the morning and at bedtime.     Omega-3 Fatty Acids (FISH OIL) 1000 MG CAPS Take 3,000 mg by mouth daily.     Oxcarbazepine  (TRILEPTAL ) 300 MG tablet TAKE 1 TABLET BY MOUTH IN THE MORNING AND 2 AT BEDTIME 90 tablet 5   potassium chloride  SA (KLOR-CON  M20) 20 MEQ tablet Take 1 tablet (20 mEq total) by mouth daily. 36 tablet 2   propranolol  (INDERAL ) 20 MG tablet Take 1 tablet (20 mg total) by mouth 3 (three) times daily as needed. For heart rate greater then 100 270 tablet 3   propranolol  ER (INDERAL  LA) 60 MG 24 hr capsule Take 1 capsule by mouth once daily 90 capsule 3   vitamin B-12 (CYANOCOBALAMIN ) 1000 MCG tablet Take 1,000 mcg by mouth daily.     vitamin E  1000 UNIT capsule Take 1,000 Units by mouth daily.     zinc  gluconate 50 MG tablet Take 50 mg by mouth daily.     ziprasidone  (GEODON ) 20 MG capsule TAKE 1 CAPSULE BY MOUTH ONCE DAILY WITH SUPPER 30 capsule 5   No current facility-administered medications for this visit.     Musculoskeletal: Strength &  Muscle Tone: within normal limits Gait & Station: normal Patient leans: N/A  Psychiatric Specialty Exam: Review of Systems  Psychiatric/Behavioral: Negative.      Blood pressure 122/81, pulse 65, temperature (!) 96.9 F (36.1 C), temperature source Temporal, height 5' 7 (1.702 m), weight 134 lb 6.4 oz (61 kg).Body mass index is 21.05 kg/m.  General Appearance: Casual  Eye Contact:  Fair  Speech:  Clear and Coherent  Volume:  Normal  Mood:  Euthymic  Affect:  Congruent  Thought Process:  Goal Directed and Descriptions of Associations: Intact  Orientation:  Full (Time, Place, and Person)  Thought Content: Logical   Suicidal Thoughts:  No  Homicidal Thoughts:  No  Memory:  Immediate;   Fair Recent;   Fair Remote;   Fair  Judgement:  Fair  Insight:  Fair  Psychomotor Activity:  Normal  Concentration:  Concentration: Fair and Attention Span: Fair  Recall:  [employer] of Knowledge: Fair  Language: Fair  Akathisia:  No  Handed:  Right  AIMS (if indicated): done  Assets:  Communication Skills Desire for Improvement Housing Social Support Transportation  ADL's:  Intact  Cognition: WNL  Sleep:  Fair   Screenings: Midwife Visit from 08/12/2024 in [facility] Office Visit from 03/11/2024 in [facility] Office Visit from 12/04/2023 in [facility] Office Visit from 07/30/2023 in [facility] Office Visit from 11/06/2022 in [facility]  AIMS Total Score 0 0 0 0 0   GAD-7    Flowsheet Row Office Visit from 08/12/2024 in [facility] Office Visit from 03/18/2024 in [facility] Visit from 03/11/2024 in [facility] Office Visit from 12/04/2023 in  [facility] Office Visit from 09/18/2023 in [facility]  Total GAD-7 Score 1 1 1  0 0   Mini-Mental    Flowsheet Row Clinical Support from 11/06/2017 in [facility]  Total Score (max 30 points ) 30   PHQ2-9    Flowsheet Row Office Visit from 08/12/2024 in [facility] Office Visit from 03/18/2024 in [facility] Visit from 03/11/2024 in [facility] Office Visit from 12/04/2023 in [facility] Clinical Support from 11/20/2023 in [facility] Total Score 1 0 1 0 0  PHQ-9 Total Score -- 0 -- -- 1   Flowsheet Row Office Visit from 08/12/2024 in [facility] Office Visit from 03/11/2024 in [facility] ED from 02/04/2024 in [facility] at [facility]  C-SSRS RISK CATEGORY Moderate Risk Moderate Risk No Risk     Assessment and Plan: Amy Mcconnell is a 73 year old Caucasian female who has a history of bipolar disorder, primary insomnia was evaluated in office today.  Discussed assessment and plan as noted below.  1. Bipolar disorder, in full remission, most recent episode depressed Currently denies any significant mood swings depression or mania. Continue Lithium  600 mg at bedtime Continue Trileptal  900 mg at bedtime Continue Geodon  20 mg daily  2. Primary insomnia-stable Continues to work on sleep hygiene, although interrupted overall sleep is good. Continue sleep hygiene techniques  3. High risk medication use Order lithium  level.  Patient to go to [facility] lab.  Reviewed and discussed BMP dated 07/31/2024-within normal limits, TSH dated 07/01/2024 abnormal at 6.169.   Patient with Yvone' disease is currently under the care of endocrinology and has upcoming labs scheduled to repeat TSH.  Follow-up Follow-up in clinic in 4 to 5 months or sooner if needed.    Collaboration of Care: Collaboration of Care: Other encouraged to continue to follow-up with endocrinology for thyroid  abnormalities and repeat thyroid  labs.  Patient/Guardian was advised Release of Information must be obtained prior to any record release in order to collaborate their care with an outside provider. Patient/Guardian was advised if they have not already done so to contact the registration department to sign all necessary forms in order for us  to release information regarding their care.   Consent: Patient/Guardian gives verbal consent for treatment and assignment of benefits for services provided during this visit. Patient/Guardian expressed understanding and agreed to proceed.   This note was generated in part or whole with voice recognition software. Voice recognition is usually quite accurate but there are transcription errors that  can and very often do occur. I apologize for any typographical errors that were not detected and corrected.    Malichi Palardy, MD 08/12/2024, 10:56 AM

## 2024-08-14 ENCOUNTER — Ambulatory Visit: Payer: Self-pay | Admitting: Psychiatry

## 2024-08-14 ENCOUNTER — Other Ambulatory Visit
Admission: RE | Admit: 2024-08-14 | Discharge: 2024-08-14 | Disposition: A | Discharge status: Home / Self Care | Attending: Psychiatry | Admitting: Psychiatry

## 2024-08-14 DIAGNOSIS — Z79899 Other long term (current) drug therapy: Secondary | ICD-10-CM | POA: Insufficient documentation

## 2024-08-14 LAB — LITHIUM LEVEL: Lithium Lvl: 0.78 mmol/L (ref 0.60–1.20)

## 2024-08-26 ENCOUNTER — Encounter

## 2024-08-29 ENCOUNTER — Other Ambulatory Visit: Payer: Self-pay | Admitting: Cardiovascular Disease

## 2024-08-31 ENCOUNTER — Other Ambulatory Visit (HOSPITAL_COMMUNITY): Payer: Self-pay | Admitting: Psychiatry

## 2024-08-31 DIAGNOSIS — F3176 Bipolar disorder, in full remission, most recent episode depressed: Secondary | ICD-10-CM

## 2024-09-07 DIAGNOSIS — E0500 Thyrotoxicosis with diffuse goiter without thyrotoxic crisis or storm: Secondary | ICD-10-CM | POA: Diagnosis not present

## 2024-09-14 DIAGNOSIS — E0590 Thyrotoxicosis, unspecified without thyrotoxic crisis or storm: Secondary | ICD-10-CM | POA: Diagnosis not present

## 2024-09-14 DIAGNOSIS — Z1331 Encounter for screening for depression: Secondary | ICD-10-CM | POA: Diagnosis not present

## 2024-09-21 ENCOUNTER — Telehealth: Payer: Self-pay | Admitting: Nurse Practitioner

## 2024-09-21 NOTE — Telephone Encounter (Signed)
 Lm and sent MyChart message that 09/23/2024 appointment has been rescheduled to 10/15/2024 @ 11am.

## 2024-09-23 ENCOUNTER — Ambulatory Visit: Admitting: Nurse Practitioner

## 2024-09-23 NOTE — Telephone Encounter (Signed)
 open in error

## 2024-09-24 NOTE — Telephone Encounter (Signed)
 open in error

## 2024-09-26 ENCOUNTER — Other Ambulatory Visit: Payer: Self-pay | Admitting: Psychiatry

## 2024-09-26 DIAGNOSIS — F3176 Bipolar disorder, in full remission, most recent episode depressed: Secondary | ICD-10-CM

## 2024-10-03 ENCOUNTER — Other Ambulatory Visit: Payer: Self-pay | Admitting: Cardiovascular Disease

## 2024-10-14 DIAGNOSIS — L570 Actinic keratosis: Secondary | ICD-10-CM | POA: Diagnosis not present

## 2024-10-14 DIAGNOSIS — D2272 Melanocytic nevi of left lower limb, including hip: Secondary | ICD-10-CM | POA: Diagnosis not present

## 2024-10-14 DIAGNOSIS — D2261 Melanocytic nevi of right upper limb, including shoulder: Secondary | ICD-10-CM | POA: Diagnosis not present

## 2024-10-14 DIAGNOSIS — D2262 Melanocytic nevi of left upper limb, including shoulder: Secondary | ICD-10-CM | POA: Diagnosis not present

## 2024-10-14 DIAGNOSIS — D2271 Melanocytic nevi of right lower limb, including hip: Secondary | ICD-10-CM | POA: Diagnosis not present

## 2024-10-15 ENCOUNTER — Ambulatory Visit: Admitting: Nurse Practitioner

## 2024-10-16 ENCOUNTER — Other Ambulatory Visit: Payer: Self-pay | Admitting: Nurse Practitioner

## 2024-10-16 DIAGNOSIS — T782XXA Anaphylactic shock, unspecified, initial encounter: Secondary | ICD-10-CM

## 2024-10-16 NOTE — Telephone Encounter (Signed)
 Copied from CRM #[id number]. Topic: Clinical - Medication Refill >> Oct 16, 2024  2:21 PM Thersia C wrote: Medication: EPINEPHrine  (EPIPEN  2-PAK) 0.3 mg/0.3 mL IJ SOAJ injection  Has the patient contacted their pharmacy? Yes (Agent: If no, request that the patient contact the pharmacy for the refill. If patient does not wish to contact the pharmacy document the reason why and proceed with request.) (Agent: If yes, when and what did the pharmacy advise?)  This is the patient's preferred pharmacy:  [facility] [address] [place] - [address] [place] [address] Phone: [phone] Fax: [phone]  Is this the correct pharmacy for this prescription? Yes If no, delete pharmacy and type the correct one.   Has the prescription been filled recently? No  Is the patient out of the medication? Yes  Has the patient been seen for an appointment in the last year OR does the patient have an upcoming appointment? Yes  Can we respond through MyChart? Yes  Agent: Please be advised that Rx refills may take up to 3 business days. We ask that you follow-up with your pharmacy.

## 2024-10-20 MED ORDER — EPINEPHRINE 0.3 MG/0.3ML IJ SOAJ: 0.3000 mg | INTRAMUSCULAR | 0 refills | Status: AC | PRN

## 2024-10-30 ENCOUNTER — Ambulatory Visit: Admitting: Nurse Practitioner

## 2024-10-30 ENCOUNTER — Encounter: Payer: Self-pay | Admitting: Nurse Practitioner

## 2024-10-30 VITALS — BP 128/80 | HR 55 | Temp 97.9°F | Ht 67.0 in | Wt 130.8 lb

## 2024-10-30 DIAGNOSIS — E782 Mixed hyperlipidemia: Secondary | ICD-10-CM

## 2024-10-30 DIAGNOSIS — Z0000 Encounter for general adult medical examination without abnormal findings: Secondary | ICD-10-CM

## 2024-10-30 DIAGNOSIS — E0500 Thyrotoxicosis with diffuse goiter without thyrotoxic crisis or storm: Secondary | ICD-10-CM | POA: Diagnosis not present

## 2024-10-30 DIAGNOSIS — F3170 Bipolar disorder, currently in remission, most recent episode unspecified: Secondary | ICD-10-CM

## 2024-10-30 DIAGNOSIS — I4891 Unspecified atrial fibrillation: Secondary | ICD-10-CM

## 2024-10-30 DIAGNOSIS — I10 Essential (primary) hypertension: Secondary | ICD-10-CM

## 2024-10-30 NOTE — Progress Notes (Signed)
 " Leron Glance, NP-C Phone: [phone]  Amy Mcconnell is a 73 y.o. female who presents today for annual exam.   Discussed the use of AI scribe software for clinical note transcription with the patient, who gave verbal consent to proceed.  History of Present Illness   Amy Mcconnell is a 73 year old female who presents for annual exam.   She was recently diagnosed with actinic keratosis, with two lesions treated with cryotherapy and another lesion on her arm left untreated.  She is currently off methimazole for her thyroid  condition, with recent blood work within normal limits. She has a follow-up appointment for blood work on January 5th and a subsequent visit on January 12th.  She takes propranolol  in two doses, which causes her to feel wobbly, necessitating caution to avoid falls. Cardiology advised she is unable to decrease or stop the medication. She is also on Eliquis  and takes Lasix  three times a week, which exacerbates her bladder leakage, requiring her to wear a pad.  A cardiac PET scan this year revealed plaque in one artery.  No chest pain, shortness of breath, lightheadedness, swelling, abdominal pain, or trouble using the bathroom. She experiences bladder leakage, especially on days she takes Lasix . No burning during urination, headaches, or trouble swallowing.  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 Her diet is well-balanced, focusing on protein, brown rice, and vegetables, and she cooks at home rather than eating out.  She experiences occasional early morning awakenings, which she attributes to her medication, but she manages by reading or watching TV. No significant mood issues, anxiety, or depression.      Tobacco Use History[1]  Medications Ordered Prior to Encounter[2]   ROS see history of present illness  Objective  Physical Exam Vitals:    10/30/24 1021  BP: 128/80  Pulse: (!) 55  Temp: 97.9 F (36.6 C)  SpO2: 99%    BP Readings from Last 3 Encounters:  10/30/24 128/80  07/31/24 110/68  04/08/24 128/80   Wt Readings from Last 3 Encounters:  10/30/24 130 lb 12.8 oz (59.3 kg)  07/31/24 134 lb 6 oz (61 kg)  04/08/24 131 lb 12.8 oz (59.8 kg)    Physical Exam Constitutional:      General: She is not in acute distress.    Appearance: Normal appearance.  HENT:     Head: Normocephalic.     Right Ear: Tympanic membrane normal.     Left Ear: Tympanic membrane normal.     Nose: Nose normal.     Mouth/Throat:     Mouth: Mucous membranes are moist.     Pharynx: Oropharynx is clear.  Eyes:     Conjunctiva/sclera: Conjunctivae normal.     Pupils: Pupils are equal, round, and reactive to light.  Neck:     Thyroid : No thyromegaly.  Cardiovascular:     Rate and Rhythm: Normal rate and regular rhythm.     Heart sounds: Normal heart sounds.  Pulmonary:     Effort: Pulmonary effort is normal.     Breath sounds: Normal breath sounds.  Abdominal:     General: Abdomen is flat. Bowel sounds are normal.     Palpations: Abdomen is soft. There is no mass.     Tenderness: There is no abdominal tenderness.  Musculoskeletal:        General: Normal range of motion.  Lymphadenopathy:  Cervical: No cervical adenopathy.  Skin:    General: Skin is warm and dry.     Findings: No rash.  Neurological:     General: No focal deficit present.     Mental Status: She is alert.  Psychiatric:        Mood and Affect: Mood normal.        Behavior: Behavior normal.      Assessment/Plan: Please see individual problem list.  Routine general medical examination at a health care facility Assessment & Plan: Physical exam complete. Lab work is up to date, no additional labs needed at this time. Mammogram is scheduled for January 2nd. Colonoscopy is up to date, additional is no longer indicated. Pap smear no longer indicated, aged out.  Flu, tetanus and COVID vaccines are up to date. Shingles and pneumonia vaccine series completed. She reports no significant mood or sleep issues and maintains a healthy lifestyle. Continue routine dental and eye exams. Ensure the mammogram is completed and maintain the current diet and exercise regimen. Return to care in six months, sooner as needed.    Atrial fibrillation, unspecified type [facility]) Assessment & Plan: This condition is managed with propranolol  and Eliquis . She reports feeling wobbly due to propranolol  but has no chest pain, shortness of breath, or swelling. Unable to decrease or discontinue per Cardiology recommendations. Continue the current dose of propranolol  and Eliquis . Exercise caution to prevent falls due to wobbliness. Follow up with Cardiology as scheduled.   Essential hypertension Assessment & Plan: Blood pressure is well controlled on current medication regimen. Continue Propranolol  60 mg daily, Cardura  2 mg twice daily, and Lasix  20 mg three times per week. Follow up with Cardiology as scheduled.    Graves' disease without crisis Assessment & Plan: She is off methimazole with normal blood work and is undergoing a trial period with the endocrinologist. No significant symptoms are reported. Continue follow-up with the endocrinologist on January 5th for blood work and January 12th for further evaluation.   Bipolar disorder in full remission, most recent episode unspecified type Assessment & Plan: Stable on current medication regimen. Continue current management and follow up with Psychiatry as scheduled.    Mixed hyperlipidemia Assessment & Plan: Hyperlipidemia is managed with Lipitor, and she reports no muscle aches. Continue Lipitor at 40 mg as prescribed. Advised continued dietary modifications.       Return in about 6 months (around 04/30/2025) for Follow up.   Leron Glance, NP-C [facility] [place]     [1]  Social  History Tobacco Use  Smoking Status Former   Current packs/day: 0.00   Average packs/day: 0.5 packs/day for 13.0 years (6.5 ttl pk-yrs)   Types: Cigarettes   Start date: 01/23/1987   Quit date: 01/23/2000   Years since quitting: 24.7  Smokeless Tobacco Never  [2]  Current Outpatient Medications on File Prior to Visit  Medication Sig Dispense Refill   apixaban  (ELIQUIS ) 5 MG TABS tablet Take 1 tablet by mouth twice daily 180 tablet 1   atorvastatin  (LIPITOR) 40 MG tablet Take 1 tablet (40 mg total) by mouth daily. 90 tablet 3   B Complex Vitamins (VITAMIN B COMPLEX PO) Take 1 tablet by mouth daily.     BOOSTRIX 5-2.5-18.5 LF-MCG/0.5 injection      calcium  carbonate (OS-CAL) 600 MG TABS tablet Take 600 mg by mouth daily.      Cholecalciferol (VITAMIN D3) 50 MCG (2000 UT) capsule Take 2,000 Units by mouth daily.  COVID-19 mRNA vaccine, Pfizer, (COMIRNATY ) syringe Inject into the muscle. 0.3 mL 0   diphenhydrAMINE  (BENADRYL ) 25 mg capsule Take 25 mg by mouth daily as needed for allergies.     docusate sodium  (COLACE) 100 MG capsule Take 100 mg by mouth 2 (two) times daily.     doxazosin  (CARDURA ) 2 MG tablet TAKE 1 TABLET BY MOUTH TWICE DAILY (MAY  TAKE  EXTRA  TABLET  AS  NEEDED  FOR  BP  GREATER  THAN  160) 180 tablet 3   EPINEPHrine  (EPIPEN  2-PAK) 0.3 mg/0.3 mL IJ SOAJ injection Inject 0.3 mg into the muscle as needed for anaphylaxis. 2 each 0   fexofenadine (ALLEGRA) 180 MG tablet Take 180 mg by mouth daily as needed for allergies.     Flaxseed, Linseed, (FLAXSEED OIL PO) Take 1 capsule by mouth daily.     furosemide  (LASIX ) 20 MG tablet TAKE 1 TABLET BY MOUTH THREE TIMES A WEEK 36 tablet 2   Lactobacillus-Inulin (CULTURELLE DIGESTIVE DAILY PRO) CAPS Take by mouth daily.     lithium  carbonate (LITHOBID ) 300 MG ER tablet TAKE 2 TABLETS BY MOUTH AT BEDTIME 60 tablet 5   magnesium oxide (MAG-OX) 400 MG tablet Take 400 mg by mouth daily.     Multiple Vitamins-Minerals (VISION  FORMULA/LUTEIN) TABS Take 1 tablet by mouth in the morning and at bedtime.     Omega-3 Fatty Acids (FISH OIL) 1000 MG CAPS Take 3,000 mg by mouth daily.     Oxcarbazepine  (TRILEPTAL ) 300 MG tablet TAKE 1 TABLET BY MOUTH IN THE MORNING AND 2 AT BEDTIME 90 tablet 5   potassium chloride  SA (KLOR-CON  M) 20 MEQ tablet Take 1 tablet by mouth once daily 90 tablet 3   propranolol  (INDERAL ) 20 MG tablet Take 1 tablet (20 mg total) by mouth 3 (three) times daily as needed. For heart rate greater then 100 270 tablet 3   propranolol  ER (INDERAL  LA) 60 MG 24 hr capsule Take 1 capsule by mouth once daily 90 capsule 3   vitamin B-12 (CYANOCOBALAMIN ) 1000 MCG tablet Take 1,000 mcg by mouth daily.     vitamin E  1000 UNIT capsule Take 1,000 Units by mouth daily.     zinc  gluconate 50 MG tablet Take 50 mg by mouth daily.     ziprasidone  (GEODON ) 20 MG capsule TAKE 1 CAPSULE BY MOUTH ONCE DAILY WITH SUPPER 30 capsule 5   methimazole (TAPAZOLE) 5 MG tablet Take 2.5 mg by mouth once.     No current facility-administered medications on file prior to visit.   "

## 2024-11-03 ENCOUNTER — Encounter: Payer: Self-pay | Admitting: Nurse Practitioner

## 2024-11-03 DIAGNOSIS — Z0000 Encounter for general adult medical examination without abnormal findings: Secondary | ICD-10-CM | POA: Insufficient documentation

## 2024-11-03 NOTE — Assessment & Plan Note (Signed)
 Physical exam complete. Lab work is up to date, no additional labs needed at this time. Mammogram is scheduled for January 2nd. Colonoscopy is up to date, additional is no longer indicated. Pap smear no longer indicated, aged out. Flu, tetanus and COVID vaccines are up to date. Shingles and pneumonia vaccine series completed. She reports no significant mood or sleep issues and maintains a healthy lifestyle. Continue routine dental and eye exams. Ensure the mammogram is completed and maintain the current diet and exercise regimen. Return to care in six months, sooner as needed.

## 2024-11-03 NOTE — Assessment & Plan Note (Signed)
 She is off methimazole with normal blood work and is undergoing a trial period with the endocrinologist. No significant symptoms are reported. Continue follow-up with the endocrinologist on January 5th for blood work and January 12th for further evaluation.

## 2024-11-03 NOTE — Assessment & Plan Note (Signed)
 Blood pressure is well controlled on current medication regimen. Continue Propranolol  60 mg daily, Cardura  2 mg twice daily, and Lasix  20 mg three times per week. Follow up with Cardiology as scheduled.

## 2024-11-03 NOTE — Assessment & Plan Note (Signed)
 Stable on current medication regimen. Continue current management and follow up with Psychiatry as scheduled.

## 2024-11-03 NOTE — Assessment & Plan Note (Addendum)
 This condition is managed with propranolol  and Eliquis . She reports feeling wobbly due to propranolol  but has no chest pain, shortness of breath, or swelling. Unable to decrease or discontinue per Cardiology recommendations. Continue the current dose of propranolol  and Eliquis . Exercise caution to prevent falls due to wobbliness. Follow up with Cardiology as scheduled.

## 2024-11-03 NOTE — Assessment & Plan Note (Signed)
 Hyperlipidemia is managed with Lipitor, and she reports no muscle aches. Continue Lipitor at 40 mg as prescribed. Advised continued dietary modifications.

## 2024-11-06 ENCOUNTER — Encounter

## 2024-11-25 ENCOUNTER — Ambulatory Visit: Payer: 59

## 2024-12-07 ENCOUNTER — Encounter

## 2024-12-21 ENCOUNTER — Ambulatory Visit

## 2024-12-30 ENCOUNTER — Encounter: Admitting: Nurse Practitioner

## 2025-01-01 ENCOUNTER — Encounter

## 2025-01-04 ENCOUNTER — Ambulatory Visit: Admitting: Psychiatry

## 2025-04-30 ENCOUNTER — Ambulatory Visit: Admitting: Nurse Practitioner
